# Patient Record
Sex: Male | Born: 1935 | Race: White | Hispanic: No | Marital: Married | Smoking: Former smoker
Health system: Southern US, Community
[De-identification: ages and names within clinical notes are randomized; demographics above are authoritative.]

## PROBLEM LIST (undated history)

## (undated) DIAGNOSIS — I2510 Atherosclerotic heart disease of native coronary artery without angina pectoris: Secondary | ICD-10-CM

## (undated) DIAGNOSIS — M4800 Spinal stenosis, site unspecified: Secondary | ICD-10-CM

## (undated) DIAGNOSIS — Z9289 Personal history of other medical treatment: Secondary | ICD-10-CM

## (undated) DIAGNOSIS — I219 Acute myocardial infarction, unspecified: Secondary | ICD-10-CM

## (undated) DIAGNOSIS — K219 Gastro-esophageal reflux disease without esophagitis: Secondary | ICD-10-CM

## (undated) DIAGNOSIS — Z87438 Personal history of other diseases of male genital organs: Secondary | ICD-10-CM

## (undated) DIAGNOSIS — G2581 Restless legs syndrome: Secondary | ICD-10-CM

## (undated) DIAGNOSIS — D519 Vitamin B12 deficiency anemia, unspecified: Secondary | ICD-10-CM

## (undated) DIAGNOSIS — I4891 Unspecified atrial fibrillation: Secondary | ICD-10-CM

## (undated) DIAGNOSIS — M1990 Unspecified osteoarthritis, unspecified site: Secondary | ICD-10-CM

## (undated) DIAGNOSIS — I10 Essential (primary) hypertension: Secondary | ICD-10-CM

## (undated) DIAGNOSIS — I5032 Chronic diastolic (congestive) heart failure: Secondary | ICD-10-CM

## (undated) DIAGNOSIS — G4700 Insomnia, unspecified: Secondary | ICD-10-CM

## (undated) DIAGNOSIS — E785 Hyperlipidemia, unspecified: Secondary | ICD-10-CM

## (undated) HISTORY — PX: CARPAL TUNNEL RELEASE: SHX101

## (undated) HISTORY — DX: Restless legs syndrome: G25.81

## (undated) HISTORY — DX: Insomnia, unspecified: G47.00

## (undated) HISTORY — PX: TOTAL KNEE ARTHROPLASTY: SHX125

## (undated) HISTORY — DX: Essential (primary) hypertension: I10

## (undated) HISTORY — DX: Atherosclerotic heart disease of native coronary artery without angina pectoris: I25.10

## (undated) HISTORY — DX: Chronic diastolic (congestive) heart failure: I50.32

## (undated) HISTORY — PX: INGUINAL HERNIA REPAIR: SUR1180

## (undated) HISTORY — PX: CORONARY ANGIOPLASTY: SHX604

## (undated) HISTORY — DX: Spinal stenosis, site unspecified: M48.00

## (undated) HISTORY — DX: Personal history of other diseases of male genital organs: Z87.438

## (undated) HISTORY — DX: Unspecified atrial fibrillation: I48.91

## (undated) HISTORY — PX: CORONARY ARTERY BYPASS GRAFT: SHX141

## (undated) HISTORY — DX: Hyperlipidemia, unspecified: E78.5

## (undated) HISTORY — DX: Gastro-esophageal reflux disease without esophagitis: K21.9

## (undated) HISTORY — DX: Personal history of other medical treatment: Z92.89

---

## 1994-12-03 HISTORY — PX: TRANSURETHRAL RESECTION OF PROSTATE: SHX73

## 1995-12-04 HISTORY — PX: CYSTOSCOPY WITH URETHRAL DILATATION: SHX5125

## 2002-05-20 ENCOUNTER — Encounter: Payer: Self-pay | Admitting: Family Medicine

## 2002-05-20 ENCOUNTER — Encounter: Admission: RE | Admit: 2002-05-20 | Discharge: 2002-05-20 | Payer: Self-pay | Admitting: Family Medicine

## 2002-07-08 ENCOUNTER — Encounter: Admission: RE | Admit: 2002-07-08 | Discharge: 2002-08-07 | Payer: Self-pay | Admitting: Neurosurgery

## 2002-08-17 ENCOUNTER — Encounter: Admission: RE | Admit: 2002-08-17 | Discharge: 2002-08-17 | Payer: Self-pay | Admitting: Neurosurgery

## 2002-08-18 ENCOUNTER — Inpatient Hospital Stay (HOSPITAL_COMMUNITY): Admission: EM | Admit: 2002-08-18 | Discharge: 2002-08-21 | Payer: Self-pay | Admitting: Emergency Medicine

## 2002-08-18 ENCOUNTER — Encounter: Payer: Self-pay | Admitting: Emergency Medicine

## 2004-03-13 ENCOUNTER — Encounter: Admission: RE | Admit: 2004-03-13 | Discharge: 2004-03-13 | Payer: Self-pay | Admitting: Orthopedic Surgery

## 2004-03-14 ENCOUNTER — Ambulatory Visit (HOSPITAL_BASED_OUTPATIENT_CLINIC_OR_DEPARTMENT_OTHER): Admission: RE | Admit: 2004-03-14 | Discharge: 2004-03-14 | Payer: Self-pay | Admitting: Orthopedic Surgery

## 2004-03-14 ENCOUNTER — Ambulatory Visit (HOSPITAL_COMMUNITY): Admission: RE | Admit: 2004-03-14 | Discharge: 2004-03-14 | Payer: Self-pay | Admitting: Orthopedic Surgery

## 2004-04-04 ENCOUNTER — Ambulatory Visit (HOSPITAL_COMMUNITY): Admission: RE | Admit: 2004-04-04 | Discharge: 2004-04-04 | Payer: Self-pay | Admitting: Orthopedic Surgery

## 2004-04-04 ENCOUNTER — Ambulatory Visit (HOSPITAL_BASED_OUTPATIENT_CLINIC_OR_DEPARTMENT_OTHER): Admission: RE | Admit: 2004-04-04 | Discharge: 2004-04-04 | Payer: Self-pay | Admitting: Orthopedic Surgery

## 2004-06-12 ENCOUNTER — Inpatient Hospital Stay (HOSPITAL_COMMUNITY): Admission: RE | Admit: 2004-06-12 | Discharge: 2004-06-19 | Payer: Self-pay | Admitting: Orthopedic Surgery

## 2004-07-17 ENCOUNTER — Encounter: Admission: RE | Admit: 2004-07-17 | Discharge: 2004-09-15 | Payer: Self-pay | Admitting: Orthopedic Surgery

## 2004-10-19 ENCOUNTER — Ambulatory Visit: Payer: Self-pay | Admitting: Family Medicine

## 2004-11-20 ENCOUNTER — Ambulatory Visit: Payer: Self-pay | Admitting: Family Medicine

## 2004-12-21 ENCOUNTER — Ambulatory Visit: Payer: Self-pay | Admitting: Family Medicine

## 2005-01-15 ENCOUNTER — Ambulatory Visit: Payer: Self-pay | Admitting: Family Medicine

## 2005-01-19 ENCOUNTER — Ambulatory Visit: Payer: Self-pay

## 2005-01-22 ENCOUNTER — Inpatient Hospital Stay (HOSPITAL_COMMUNITY): Admission: RE | Admit: 2005-01-22 | Discharge: 2005-01-26 | Payer: Self-pay | Admitting: Orthopedic Surgery

## 2005-01-22 ENCOUNTER — Ambulatory Visit: Payer: Self-pay | Admitting: Physical Medicine & Rehabilitation

## 2005-01-22 ENCOUNTER — Ambulatory Visit: Payer: Self-pay | Admitting: Internal Medicine

## 2005-02-14 ENCOUNTER — Ambulatory Visit: Payer: Self-pay | Admitting: Family Medicine

## 2005-02-19 ENCOUNTER — Encounter: Admission: RE | Admit: 2005-02-19 | Discharge: 2005-04-03 | Payer: Self-pay | Admitting: Orthopedic Surgery

## 2005-03-15 ENCOUNTER — Ambulatory Visit: Payer: Self-pay | Admitting: Family Medicine

## 2005-03-23 ENCOUNTER — Ambulatory Visit: Payer: Self-pay | Admitting: Cardiology

## 2005-04-03 ENCOUNTER — Ambulatory Visit: Payer: Self-pay | Admitting: Cardiology

## 2005-04-13 ENCOUNTER — Ambulatory Visit: Payer: Self-pay | Admitting: Family Medicine

## 2005-05-14 ENCOUNTER — Ambulatory Visit: Payer: Self-pay | Admitting: Family Medicine

## 2005-05-18 ENCOUNTER — Ambulatory Visit: Payer: Self-pay | Admitting: Cardiology

## 2005-06-13 ENCOUNTER — Ambulatory Visit: Payer: Self-pay | Admitting: Internal Medicine

## 2005-07-16 ENCOUNTER — Ambulatory Visit: Payer: Self-pay | Admitting: Internal Medicine

## 2005-08-15 ENCOUNTER — Ambulatory Visit: Payer: Self-pay | Admitting: Internal Medicine

## 2005-08-27 ENCOUNTER — Ambulatory Visit: Payer: Self-pay | Admitting: Internal Medicine

## 2005-10-18 ENCOUNTER — Ambulatory Visit: Payer: Self-pay | Admitting: Internal Medicine

## 2005-10-22 ENCOUNTER — Inpatient Hospital Stay (HOSPITAL_COMMUNITY): Admission: EM | Admit: 2005-10-22 | Discharge: 2005-10-23 | Payer: Self-pay | Admitting: *Deleted

## 2005-10-22 ENCOUNTER — Ambulatory Visit: Payer: Self-pay | Admitting: Internal Medicine

## 2005-10-22 ENCOUNTER — Ambulatory Visit: Payer: Self-pay | Admitting: Cardiology

## 2005-10-23 ENCOUNTER — Ambulatory Visit: Payer: Self-pay | Admitting: Internal Medicine

## 2005-10-29 ENCOUNTER — Ambulatory Visit: Payer: Self-pay

## 2005-10-31 ENCOUNTER — Ambulatory Visit: Payer: Self-pay | Admitting: Internal Medicine

## 2005-11-08 ENCOUNTER — Ambulatory Visit: Payer: Self-pay | Admitting: Cardiology

## 2006-01-15 ENCOUNTER — Ambulatory Visit: Payer: Self-pay | Admitting: Internal Medicine

## 2006-01-15 ENCOUNTER — Inpatient Hospital Stay (HOSPITAL_COMMUNITY): Admission: AD | Admit: 2006-01-15 | Discharge: 2006-01-25 | Payer: Self-pay | Admitting: *Deleted

## 2006-01-15 ENCOUNTER — Ambulatory Visit: Payer: Self-pay | Admitting: *Deleted

## 2006-02-06 ENCOUNTER — Ambulatory Visit: Payer: Self-pay

## 2006-02-06 ENCOUNTER — Ambulatory Visit: Payer: Self-pay | Admitting: Cardiology

## 2006-02-21 ENCOUNTER — Encounter (HOSPITAL_COMMUNITY): Admission: RE | Admit: 2006-02-21 | Discharge: 2006-05-22 | Payer: Self-pay | Admitting: Cardiology

## 2006-03-11 ENCOUNTER — Ambulatory Visit: Payer: Self-pay | Admitting: Internal Medicine

## 2006-03-15 ENCOUNTER — Encounter: Admission: RE | Admit: 2006-03-15 | Discharge: 2006-03-15 | Payer: Self-pay | Admitting: Cardiothoracic Surgery

## 2006-03-21 ENCOUNTER — Ambulatory Visit: Payer: Self-pay | Admitting: Cardiology

## 2006-04-24 ENCOUNTER — Ambulatory Visit: Payer: Self-pay | Admitting: Internal Medicine

## 2006-05-01 ENCOUNTER — Ambulatory Visit: Payer: Self-pay | Admitting: Cardiology

## 2006-05-08 ENCOUNTER — Ambulatory Visit: Payer: Self-pay | Admitting: Cardiology

## 2006-05-17 ENCOUNTER — Ambulatory Visit: Payer: Self-pay | Admitting: Cardiology

## 2006-05-23 ENCOUNTER — Encounter (HOSPITAL_COMMUNITY): Admission: RE | Admit: 2006-05-23 | Discharge: 2006-08-21 | Payer: Self-pay | Admitting: Cardiology

## 2006-06-25 ENCOUNTER — Ambulatory Visit: Payer: Self-pay | Admitting: Cardiology

## 2006-07-24 ENCOUNTER — Ambulatory Visit: Payer: Self-pay | Admitting: Internal Medicine

## 2006-09-18 ENCOUNTER — Ambulatory Visit: Payer: Self-pay | Admitting: Internal Medicine

## 2006-11-28 ENCOUNTER — Ambulatory Visit: Payer: Self-pay | Admitting: Cardiology

## 2006-12-03 HISTORY — PX: PACEMAKER INSERTION: SHX728

## 2006-12-16 ENCOUNTER — Ambulatory Visit: Payer: Self-pay

## 2006-12-16 LAB — CONVERTED CEMR LAB
ALT: 13 units/L (ref 0–40)
Albumin: 3.8 g/dL (ref 3.5–5.2)
BUN: 16 mg/dL (ref 6–23)
CO2: 32 meq/L (ref 19–32)
Calcium: 9.1 mg/dL (ref 8.4–10.5)
Chloride: 108 meq/L (ref 96–112)
Glomerular Filtration Rate, Af Am: 74 mL/min/{1.73_m2}
Hemoglobin: 13.4 g/dL (ref 13.0–17.0)
INR: 0.9 (ref 0.9–2.0)
MCHC: 33.7 g/dL (ref 30.0–36.0)
Magnesium: 2.4 mg/dL (ref 1.5–2.5)
Potassium: 4.3 meq/L (ref 3.5–5.1)
RBC: 4.33 M/uL (ref 4.22–5.81)
RDW: 12.8 % (ref 11.5–14.6)
TSH: 2.34 microintl units/mL (ref 0.35–5.50)
Total Bilirubin: 1.1 mg/dL (ref 0.3–1.2)
WBC: 7 10*3/uL (ref 4.5–10.5)
aPTT: 32.3 s (ref 26.5–36.5)

## 2006-12-23 ENCOUNTER — Ambulatory Visit (HOSPITAL_COMMUNITY): Admission: RE | Admit: 2006-12-23 | Discharge: 2006-12-24 | Payer: Self-pay | Admitting: Internal Medicine

## 2006-12-23 ENCOUNTER — Ambulatory Visit: Payer: Self-pay | Admitting: Internal Medicine

## 2006-12-26 ENCOUNTER — Ambulatory Visit: Payer: Self-pay | Admitting: Cardiology

## 2006-12-30 ENCOUNTER — Ambulatory Visit: Payer: Self-pay | Admitting: Internal Medicine

## 2007-01-02 ENCOUNTER — Ambulatory Visit: Payer: Self-pay

## 2007-01-08 ENCOUNTER — Ambulatory Visit: Payer: Self-pay

## 2007-01-08 ENCOUNTER — Ambulatory Visit: Payer: Self-pay | Admitting: Cardiology

## 2007-01-18 ENCOUNTER — Emergency Department (HOSPITAL_COMMUNITY): Admission: EM | Admit: 2007-01-18 | Discharge: 2007-01-18 | Payer: Self-pay | Admitting: Emergency Medicine

## 2007-01-22 ENCOUNTER — Ambulatory Visit: Payer: Self-pay | Admitting: Cardiovascular Disease

## 2007-01-29 ENCOUNTER — Ambulatory Visit: Payer: Self-pay | Admitting: Internal Medicine

## 2007-02-10 ENCOUNTER — Ambulatory Visit: Payer: Self-pay | Admitting: Internal Medicine

## 2007-02-24 ENCOUNTER — Ambulatory Visit: Payer: Self-pay | Admitting: Cardiology

## 2007-02-24 LAB — CONVERTED CEMR LAB
Basophils Absolute: 0 10*3/uL (ref 0.0–0.1)
Basophils Absolute: 0 10*3/uL (ref 0.0–0.1)
Basophils Absolute: 0 10*3/uL (ref 0.0–0.1)
Basophils Absolute: 0 10*3/uL (ref 0.0–0.1)
Basophils Absolute: 0 10*3/uL (ref 0.0–0.1)
Basophils Absolute: 0 10*3/uL (ref 0.0–0.1)
Basophils Relative: 0.6 % (ref 0.0–1.0)
Basophils Relative: 0.6 % (ref 0.0–1.0)
Basophils Relative: 0.6 % (ref 0.0–1.0)
Basophils Relative: 0.6 % (ref 0.0–1.0)
Basophils Relative: 0.6 % (ref 0.0–1.0)
Basophils Relative: 0.6 % (ref 0.0–1.0)
Basophils Relative: 0.6 % (ref 0.0–1.0)
Eosinophils Absolute: 0.4 10*3/uL (ref 0.0–0.6)
Eosinophils Absolute: 0.4 10*3/uL (ref 0.0–0.6)
Eosinophils Absolute: 0.4 10*3/uL (ref 0.0–0.6)
Eosinophils Absolute: 0.4 10*3/uL (ref 0.0–0.6)
Eosinophils Absolute: 0.4 10*3/uL (ref 0.0–0.6)
Eosinophils Absolute: 0.4 10*3/uL (ref 0.0–0.6)
Eosinophils Absolute: 0.4 10*3/uL (ref 0.0–0.6)
Eosinophils Relative: 5.5 % — ABNORMAL HIGH (ref 0.0–5.0)
Eosinophils Relative: 5.5 % — ABNORMAL HIGH (ref 0.0–5.0)
Eosinophils Relative: 5.5 % — ABNORMAL HIGH (ref 0.0–5.0)
Eosinophils Relative: 5.5 % — ABNORMAL HIGH (ref 0.0–5.0)
Eosinophils Relative: 5.5 % — ABNORMAL HIGH (ref 0.0–5.0)
Eosinophils Relative: 5.5 % — ABNORMAL HIGH (ref 0.0–5.0)
HCT: 39.2 % (ref 39.0–52.0)
HCT: 39.2 % (ref 39.0–52.0)
HCT: 39.2 % (ref 39.0–52.0)
HCT: 39.2 % (ref 39.0–52.0)
Hemoglobin: 13.4 g/dL (ref 13.0–17.0)
Hemoglobin: 13.4 g/dL (ref 13.0–17.0)
Hemoglobin: 13.4 g/dL (ref 13.0–17.0)
Hemoglobin: 13.4 g/dL (ref 13.0–17.0)
Hemoglobin: 13.4 g/dL (ref 13.0–17.0)
Hemoglobin: 13.4 g/dL (ref 13.0–17.0)
Hemoglobin: 13.4 g/dL (ref 13.0–17.0)
Hemoglobin: 13.4 g/dL (ref 13.0–17.0)
INR: 3 — ABNORMAL HIGH (ref 0.9–2.0)
INR: 3 — ABNORMAL HIGH (ref 0.9–2.0)
Lymphocytes Relative: 20.8 % (ref 12.0–46.0)
Lymphocytes Relative: 20.8 % (ref 12.0–46.0)
Lymphocytes Relative: 20.8 % (ref 12.0–46.0)
Lymphocytes Relative: 20.8 % (ref 12.0–46.0)
Lymphocytes Relative: 20.8 % (ref 12.0–46.0)
Lymphocytes Relative: 20.8 % (ref 12.0–46.0)
Lymphocytes Relative: 20.8 % (ref 12.0–46.0)
MCHC: 34.3 g/dL (ref 30.0–36.0)
MCHC: 34.3 g/dL (ref 30.0–36.0)
MCHC: 34.3 g/dL (ref 30.0–36.0)
MCV: 88.1 fL (ref 78.0–100.0)
MCV: 88.1 fL (ref 78.0–100.0)
MCV: 88.1 fL (ref 78.0–100.0)
MCV: 88.1 fL (ref 78.0–100.0)
MCV: 88.1 fL (ref 78.0–100.0)
Monocytes Absolute: 0.6 10*3/uL (ref 0.2–0.7)
Monocytes Absolute: 0.6 10*3/uL (ref 0.2–0.7)
Monocytes Absolute: 0.6 10*3/uL (ref 0.2–0.7)
Monocytes Absolute: 0.6 10*3/uL (ref 0.2–0.7)
Monocytes Relative: 7.7 % (ref 3.0–11.0)
Monocytes Relative: 7.7 % (ref 3.0–11.0)
Monocytes Relative: 7.7 % (ref 3.0–11.0)
Monocytes Relative: 7.7 % (ref 3.0–11.0)
Monocytes Relative: 7.7 % (ref 3.0–11.0)
Monocytes Relative: 7.7 % (ref 3.0–11.0)
Monocytes Relative: 7.7 % (ref 3.0–11.0)
Neutro Abs: 5.1 10*3/uL (ref 1.4–7.7)
Neutro Abs: 5.1 10*3/uL (ref 1.4–7.7)
Neutro Abs: 5.1 10*3/uL (ref 1.4–7.7)
Neutro Abs: 5.1 10*3/uL (ref 1.4–7.7)
Neutro Abs: 5.1 10*3/uL (ref 1.4–7.7)
Neutro Abs: 5.1 10*3/uL (ref 1.4–7.7)
Neutrophils Relative %: 65.4 % (ref 43.0–77.0)
Neutrophils Relative %: 65.4 % (ref 43.0–77.0)
Neutrophils Relative %: 65.4 % (ref 43.0–77.0)
Neutrophils Relative %: 65.4 % (ref 43.0–77.0)
Neutrophils Relative %: 65.4 % (ref 43.0–77.0)
Neutrophils Relative %: 65.4 % (ref 43.0–77.0)
Platelets: 256 10*3/uL (ref 150–400)
Platelets: 256 10*3/uL (ref 150–400)
Platelets: 256 10*3/uL (ref 150–400)
Prothrombin Time: 21.8 s — ABNORMAL HIGH (ref 10.0–14.0)
Prothrombin Time: 21.8 s — ABNORMAL HIGH (ref 10.0–14.0)
Prothrombin Time: 21.8 s — ABNORMAL HIGH (ref 10.0–14.0)
Prothrombin Time: 21.8 s — ABNORMAL HIGH (ref 10.0–14.0)
Prothrombin Time: 21.8 s — ABNORMAL HIGH (ref 10.0–14.0)
RBC: 4.45 M/uL (ref 4.22–5.81)
RBC: 4.45 M/uL (ref 4.22–5.81)
RBC: 4.45 M/uL (ref 4.22–5.81)
RBC: 4.45 M/uL (ref 4.22–5.81)
RBC: 4.45 M/uL (ref 4.22–5.81)
RBC: 4.45 M/uL (ref 4.22–5.81)
RBC: 4.45 M/uL (ref 4.22–5.81)
RDW: 13.1 % (ref 11.5–14.6)
RDW: 13.1 % (ref 11.5–14.6)
RDW: 13.1 % (ref 11.5–14.6)
RDW: 13.1 % (ref 11.5–14.6)
RDW: 13.1 % (ref 11.5–14.6)
WBC: 7.7 10*3/uL (ref 4.5–10.5)
WBC: 7.7 10*3/uL (ref 4.5–10.5)
WBC: 7.7 10*3/uL (ref 4.5–10.5)
WBC: 7.7 10*3/uL (ref 4.5–10.5)
WBC: 7.7 10*3/uL (ref 4.5–10.5)

## 2007-02-27 ENCOUNTER — Ambulatory Visit: Payer: Self-pay | Admitting: Cardiology

## 2007-03-04 ENCOUNTER — Ambulatory Visit: Payer: Self-pay | Admitting: *Deleted

## 2007-03-10 ENCOUNTER — Ambulatory Visit: Payer: Self-pay

## 2007-03-10 ENCOUNTER — Encounter: Payer: Self-pay | Admitting: Cardiovascular Disease

## 2007-03-10 ENCOUNTER — Ambulatory Visit: Payer: Self-pay | Admitting: Cardiology

## 2007-03-10 LAB — CONVERTED CEMR LAB
CO2: 32 meq/L (ref 19–32)
Creatinine, Ser: 1.1 mg/dL (ref 0.4–1.5)
GFR calc non Af Amer: 68 mL/min
Potassium: 4.1 meq/L (ref 3.5–5.1)
Sodium: 140 meq/L (ref 135–145)

## 2007-03-17 ENCOUNTER — Ambulatory Visit: Payer: Self-pay | Admitting: Cardiology

## 2007-04-09 ENCOUNTER — Ambulatory Visit: Payer: Self-pay | Admitting: *Deleted

## 2007-04-09 ENCOUNTER — Ambulatory Visit: Payer: Self-pay | Admitting: Cardiology

## 2007-04-09 LAB — CONVERTED CEMR LAB
ALT: 17 units/L (ref 0–40)
AST: 19 units/L (ref 0–37)
Alkaline Phosphatase: 62 units/L (ref 39–117)
BUN: 18 mg/dL (ref 6–23)
Calcium: 8.3 mg/dL — ABNORMAL LOW (ref 8.4–10.5)
Chloride: 110 meq/L (ref 96–112)
GFR calc Af Amer: 82 mL/min
TSH: 1.83 microintl units/mL (ref 0.35–5.50)
Total Bilirubin: 1 mg/dL (ref 0.3–1.2)
Total Protein: 6.9 g/dL (ref 6.0–8.3)

## 2007-04-10 ENCOUNTER — Ambulatory Visit: Payer: Self-pay | Admitting: Cardiology

## 2007-04-10 ENCOUNTER — Ambulatory Visit: Payer: Self-pay | Admitting: Internal Medicine

## 2007-04-11 ENCOUNTER — Ambulatory Visit: Payer: Self-pay | Admitting: Internal Medicine

## 2007-04-24 ENCOUNTER — Ambulatory Visit: Payer: Self-pay | Admitting: Internal Medicine

## 2007-04-29 ENCOUNTER — Ambulatory Visit: Payer: Self-pay | Admitting: Internal Medicine

## 2007-05-05 ENCOUNTER — Ambulatory Visit: Payer: Self-pay | Admitting: Cardiology

## 2007-05-15 ENCOUNTER — Ambulatory Visit: Payer: Self-pay | Admitting: Cardiovascular Disease

## 2007-05-29 ENCOUNTER — Ambulatory Visit: Payer: Self-pay | Admitting: Cardiology

## 2007-05-29 LAB — CONVERTED CEMR LAB
ALT: 26 units/L (ref 0–53)
AST: 21 units/L (ref 0–37)

## 2007-07-23 ENCOUNTER — Ambulatory Visit: Payer: Self-pay | Admitting: Cardiology

## 2007-10-01 ENCOUNTER — Ambulatory Visit: Payer: Self-pay | Admitting: Internal Medicine

## 2007-10-23 ENCOUNTER — Ambulatory Visit: Payer: Self-pay | Admitting: Cardiology

## 2007-10-23 ENCOUNTER — Ambulatory Visit: Payer: Self-pay | Admitting: Internal Medicine

## 2007-10-23 LAB — CONVERTED CEMR LAB
AST: 20 units/L (ref 0–37)
Albumin: 4.1 g/dL (ref 3.5–5.2)
BUN: 18 mg/dL (ref 6–23)
CO2: 30 meq/L (ref 19–32)
Creatinine, Ser: 1.3 mg/dL (ref 0.4–1.5)
GFR calc Af Amer: 68 mL/min
Glucose, Bld: 104 mg/dL — ABNORMAL HIGH (ref 70–99)
Potassium: 5.1 meq/L (ref 3.5–5.1)

## 2008-01-21 ENCOUNTER — Ambulatory Visit: Payer: Self-pay | Admitting: Internal Medicine

## 2008-04-21 ENCOUNTER — Ambulatory Visit: Payer: Self-pay | Admitting: Internal Medicine

## 2008-06-07 ENCOUNTER — Telehealth (INDEPENDENT_AMBULATORY_CARE_PROVIDER_SITE_OTHER): Payer: Self-pay | Admitting: *Deleted

## 2008-06-07 ENCOUNTER — Ambulatory Visit: Payer: Self-pay | Admitting: Internal Medicine

## 2008-06-07 DIAGNOSIS — R0609 Other forms of dyspnea: Secondary | ICD-10-CM | POA: Insufficient documentation

## 2008-06-07 DIAGNOSIS — I10 Essential (primary) hypertension: Secondary | ICD-10-CM | POA: Insufficient documentation

## 2008-06-07 DIAGNOSIS — R0989 Other specified symptoms and signs involving the circulatory and respiratory systems: Secondary | ICD-10-CM

## 2008-06-10 ENCOUNTER — Ambulatory Visit: Payer: Self-pay | Admitting: Cardiology

## 2008-06-24 ENCOUNTER — Ambulatory Visit: Payer: Self-pay | Admitting: Cardiology

## 2008-07-28 ENCOUNTER — Ambulatory Visit: Payer: Self-pay | Admitting: Internal Medicine

## 2008-09-24 ENCOUNTER — Telehealth (INDEPENDENT_AMBULATORY_CARE_PROVIDER_SITE_OTHER): Payer: Self-pay | Admitting: *Deleted

## 2008-09-29 ENCOUNTER — Ambulatory Visit: Payer: Self-pay | Admitting: Internal Medicine

## 2008-10-11 ENCOUNTER — Ambulatory Visit: Payer: Self-pay

## 2008-10-26 ENCOUNTER — Telehealth (INDEPENDENT_AMBULATORY_CARE_PROVIDER_SITE_OTHER): Payer: Self-pay | Admitting: *Deleted

## 2008-11-03 ENCOUNTER — Ambulatory Visit: Payer: Self-pay | Admitting: Cardiology

## 2008-11-25 ENCOUNTER — Ambulatory Visit: Payer: Self-pay | Admitting: Internal Medicine

## 2008-11-25 DIAGNOSIS — K219 Gastro-esophageal reflux disease without esophagitis: Secondary | ICD-10-CM | POA: Insufficient documentation

## 2008-11-25 DIAGNOSIS — N400 Enlarged prostate without lower urinary tract symptoms: Secondary | ICD-10-CM

## 2008-11-25 DIAGNOSIS — I5032 Chronic diastolic (congestive) heart failure: Secondary | ICD-10-CM

## 2008-11-25 DIAGNOSIS — I4891 Unspecified atrial fibrillation: Secondary | ICD-10-CM

## 2008-11-25 DIAGNOSIS — E785 Hyperlipidemia, unspecified: Secondary | ICD-10-CM

## 2008-11-25 DIAGNOSIS — G4700 Insomnia, unspecified: Secondary | ICD-10-CM

## 2008-12-01 ENCOUNTER — Telehealth: Payer: Self-pay | Admitting: Internal Medicine

## 2008-12-21 ENCOUNTER — Ambulatory Visit: Payer: Self-pay | Admitting: Internal Medicine

## 2008-12-23 ENCOUNTER — Telehealth (INDEPENDENT_AMBULATORY_CARE_PROVIDER_SITE_OTHER): Payer: Self-pay | Admitting: *Deleted

## 2008-12-23 ENCOUNTER — Ambulatory Visit: Payer: Self-pay | Admitting: Internal Medicine

## 2008-12-23 ENCOUNTER — Encounter (INDEPENDENT_AMBULATORY_CARE_PROVIDER_SITE_OTHER): Payer: Self-pay | Admitting: *Deleted

## 2008-12-23 ENCOUNTER — Telehealth: Payer: Self-pay | Admitting: Internal Medicine

## 2008-12-24 LAB — CONVERTED CEMR LAB
BUN: 17 mg/dL (ref 6–23)
Basophils Relative: 0.1 % (ref 0.0–3.0)
Calcium: 8.9 mg/dL (ref 8.4–10.5)
Creatinine, Ser: 1.1 mg/dL (ref 0.4–1.5)
Eosinophils Absolute: 0.4 10*3/uL (ref 0.0–0.7)
Eosinophils Relative: 5.2 % — ABNORMAL HIGH (ref 0.0–5.0)
Ferritin: 36.7 ng/mL (ref 22.0–322.0)
GFR calc Af Amer: 82 mL/min
GFR calc non Af Amer: 68 mL/min
HCT: 37.9 % — ABNORMAL LOW (ref 39.0–52.0)
HDL: 25.8 mg/dL — ABNORMAL LOW (ref >39.0)
MCV: 91.1 fL (ref 78.0–100.0)
Monocytes Absolute: 0.6 10*3/uL (ref 0.1–1.0)
Platelets: 216 10*3/uL (ref 150–400)
Potassium: 4.7 meq/L (ref 3.5–5.1)
WBC: 7.1 10*3/uL (ref 4.5–10.5)

## 2009-01-11 ENCOUNTER — Ambulatory Visit: Payer: Self-pay | Admitting: Internal Medicine

## 2009-01-17 ENCOUNTER — Encounter (INDEPENDENT_AMBULATORY_CARE_PROVIDER_SITE_OTHER): Payer: Self-pay | Admitting: *Deleted

## 2009-01-17 LAB — CONVERTED CEMR LAB: Fecal Occult Bld: NEGATIVE

## 2009-01-21 ENCOUNTER — Ambulatory Visit: Payer: Self-pay | Admitting: Internal Medicine

## 2009-02-01 ENCOUNTER — Ambulatory Visit: Payer: Self-pay | Admitting: Internal Medicine

## 2009-02-01 DIAGNOSIS — D649 Anemia, unspecified: Secondary | ICD-10-CM

## 2009-02-01 DIAGNOSIS — M549 Dorsalgia, unspecified: Secondary | ICD-10-CM | POA: Insufficient documentation

## 2009-03-10 ENCOUNTER — Encounter: Payer: Self-pay | Admitting: Internal Medicine

## 2009-04-29 ENCOUNTER — Encounter: Payer: Self-pay | Admitting: Internal Medicine

## 2009-06-24 DIAGNOSIS — I495 Sick sinus syndrome: Secondary | ICD-10-CM | POA: Insufficient documentation

## 2009-06-24 DIAGNOSIS — I25810 Atherosclerosis of coronary artery bypass graft(s) without angina pectoris: Secondary | ICD-10-CM | POA: Insufficient documentation

## 2009-06-27 ENCOUNTER — Telehealth (INDEPENDENT_AMBULATORY_CARE_PROVIDER_SITE_OTHER): Payer: Self-pay | Admitting: *Deleted

## 2009-07-01 ENCOUNTER — Ambulatory Visit: Payer: Self-pay | Admitting: Cardiology

## 2009-07-26 ENCOUNTER — Inpatient Hospital Stay (HOSPITAL_COMMUNITY): Admission: EM | Admit: 2009-07-26 | Discharge: 2009-07-27 | Payer: Self-pay | Admitting: Emergency Medicine

## 2009-07-26 ENCOUNTER — Ambulatory Visit: Payer: Self-pay | Admitting: Internal Medicine

## 2009-07-27 ENCOUNTER — Ambulatory Visit: Payer: Self-pay | Admitting: Vascular Surgery

## 2009-07-27 ENCOUNTER — Encounter (INDEPENDENT_AMBULATORY_CARE_PROVIDER_SITE_OTHER): Payer: Self-pay | Admitting: Internal Medicine

## 2009-08-18 ENCOUNTER — Ambulatory Visit: Payer: Self-pay | Admitting: Internal Medicine

## 2009-08-18 DIAGNOSIS — R7309 Other abnormal glucose: Secondary | ICD-10-CM | POA: Insufficient documentation

## 2009-08-18 DIAGNOSIS — R55 Syncope and collapse: Secondary | ICD-10-CM | POA: Insufficient documentation

## 2009-08-19 ENCOUNTER — Encounter: Payer: Self-pay | Admitting: Internal Medicine

## 2009-08-23 ENCOUNTER — Telehealth (INDEPENDENT_AMBULATORY_CARE_PROVIDER_SITE_OTHER): Payer: Self-pay | Admitting: *Deleted

## 2009-08-24 ENCOUNTER — Ambulatory Visit: Payer: Self-pay | Admitting: Internal Medicine

## 2009-08-30 LAB — CONVERTED CEMR LAB
Ferritin: 20.4 ng/mL — ABNORMAL LOW (ref 22.0–322.0)
Hemoglobin: 12.4 g/dL — ABNORMAL LOW (ref 13.0–17.0)

## 2009-09-21 ENCOUNTER — Ambulatory Visit: Payer: Self-pay | Admitting: Internal Medicine

## 2009-09-26 ENCOUNTER — Encounter (INDEPENDENT_AMBULATORY_CARE_PROVIDER_SITE_OTHER): Payer: Self-pay | Admitting: *Deleted

## 2009-09-29 ENCOUNTER — Ambulatory Visit: Payer: Self-pay | Admitting: Cardiology

## 2009-10-11 ENCOUNTER — Ambulatory Visit: Payer: Self-pay | Admitting: Internal Medicine

## 2010-01-04 ENCOUNTER — Telehealth: Payer: Self-pay | Admitting: Cardiology

## 2010-01-30 ENCOUNTER — Ambulatory Visit: Payer: Self-pay | Admitting: Cardiology

## 2010-01-30 ENCOUNTER — Encounter: Payer: Self-pay | Admitting: Internal Medicine

## 2010-01-31 ENCOUNTER — Ambulatory Visit: Payer: Self-pay | Admitting: Internal Medicine

## 2010-01-31 DIAGNOSIS — I472 Ventricular tachycardia: Secondary | ICD-10-CM

## 2010-02-28 ENCOUNTER — Ambulatory Visit: Payer: Self-pay | Admitting: Internal Medicine

## 2010-03-14 ENCOUNTER — Encounter (INDEPENDENT_AMBULATORY_CARE_PROVIDER_SITE_OTHER): Payer: Self-pay | Admitting: *Deleted

## 2010-03-22 ENCOUNTER — Telehealth: Payer: Self-pay | Admitting: Internal Medicine

## 2010-04-04 ENCOUNTER — Ambulatory Visit: Payer: Self-pay | Admitting: Cardiology

## 2010-04-17 ENCOUNTER — Telehealth (INDEPENDENT_AMBULATORY_CARE_PROVIDER_SITE_OTHER): Payer: Self-pay | Admitting: *Deleted

## 2010-05-04 ENCOUNTER — Ambulatory Visit: Payer: Self-pay | Admitting: Internal Medicine

## 2010-05-04 DIAGNOSIS — K625 Hemorrhage of anus and rectum: Secondary | ICD-10-CM | POA: Insufficient documentation

## 2010-05-05 ENCOUNTER — Encounter (INDEPENDENT_AMBULATORY_CARE_PROVIDER_SITE_OTHER): Payer: Self-pay | Admitting: *Deleted

## 2010-05-10 ENCOUNTER — Telehealth (INDEPENDENT_AMBULATORY_CARE_PROVIDER_SITE_OTHER): Payer: Self-pay | Admitting: *Deleted

## 2010-05-10 LAB — CONVERTED CEMR LAB
BUN: 15 mg/dL (ref 6–23)
Basophils Absolute: 0 10*3/uL (ref 0.0–0.1)
Calcium: 8.6 mg/dL (ref 8.4–10.5)
Eosinophils Absolute: 0.3 10*3/uL (ref 0.0–0.7)
GFR calc non Af Amer: 71.11 mL/min (ref >60)
Glucose, Bld: 90 mg/dL (ref 70–99)
HCT: 34.8 % — ABNORMAL LOW (ref 39.0–52.0)
Lymphocytes Relative: 20.2 % (ref 12.0–46.0)
Lymphs Abs: 1.3 10*3/uL (ref 0.7–4.0)
Monocytes Relative: 9.2 % (ref 3.0–12.0)
PSA: 0.52 ng/mL (ref 0.10–4.00)
Platelets: 219 10*3/uL (ref 150.0–400.0)
RDW: 14.9 % — ABNORMAL HIGH (ref 11.5–14.6)
Transferrin: 242.7 mg/dL (ref 212.0–360.0)
Vitamin B-12: 207 pg/mL — ABNORMAL LOW (ref 211–911)

## 2010-05-31 ENCOUNTER — Ambulatory Visit: Payer: Self-pay | Admitting: Internal Medicine

## 2010-06-04 ENCOUNTER — Inpatient Hospital Stay (HOSPITAL_COMMUNITY): Admission: EM | Admit: 2010-06-04 | Discharge: 2010-06-12 | Payer: Self-pay | Admitting: Internal Medicine

## 2010-06-04 ENCOUNTER — Encounter: Payer: Self-pay | Admitting: Emergency Medicine

## 2010-06-04 ENCOUNTER — Ambulatory Visit: Payer: Self-pay | Admitting: Internal Medicine

## 2010-06-05 ENCOUNTER — Encounter: Payer: Self-pay | Admitting: Internal Medicine

## 2010-06-08 ENCOUNTER — Encounter: Payer: Self-pay | Admitting: Internal Medicine

## 2010-06-08 ENCOUNTER — Encounter: Payer: Self-pay | Admitting: Cardiology

## 2010-06-16 ENCOUNTER — Encounter: Payer: Self-pay | Admitting: Cardiology

## 2010-06-16 ENCOUNTER — Ambulatory Visit: Payer: Self-pay | Admitting: Cardiology

## 2010-06-16 DIAGNOSIS — I498 Other specified cardiac arrhythmias: Secondary | ICD-10-CM | POA: Insufficient documentation

## 2010-06-21 ENCOUNTER — Encounter: Payer: Self-pay | Admitting: Internal Medicine

## 2010-06-23 ENCOUNTER — Encounter: Payer: Self-pay | Admitting: Internal Medicine

## 2010-06-23 LAB — CONVERTED CEMR LAB
ALT: 27 U/L
AST: 23 U/L
Albumin: 3.2 g/dL — ABNORMAL LOW
Alkaline Phosphatase: 68 U/L
BUN: 18 mg/dL
Basophils Absolute: 0.1 10*3/uL
Basophils Relative: 0.7 %
Bilirubin, Direct: 0.1 mg/dL
CO2: 28 meq/L
Calcium: 8.4 mg/dL
Chloride: 109 meq/L
Creatinine, Ser: 1.4 mg/dL
Eosinophils Absolute: 0.4 10*3/uL
Eosinophils Relative: 3.9 %
GFR calc non Af Amer: 51.43 mL/min
Glucose, Bld: 94 mg/dL
HCT: 30.1 % — ABNORMAL LOW
Hemoglobin: 10.3 g/dL — ABNORMAL LOW
Lymphocytes Relative: 10.3 % — ABNORMAL LOW
Lymphs Abs: 0.9 10*3/uL
MCHC: 34.2 g/dL
MCV: 91.9 fL
Monocytes Absolute: 0.8 10*3/uL
Monocytes Relative: 8.7 %
Neutro Abs: 6.9 10*3/uL
Neutrophils Relative %: 76.4 %
Platelets: 360 10*3/uL
Potassium: 5 meq/L
RBC: 3.28 M/uL — ABNORMAL LOW
RDW: 13.7 %
Sodium: 140 meq/L
Total Bilirubin: 0.4 mg/dL
Total Protein: 6.6 g/dL
WBC: 9 10*3/uL

## 2010-06-28 ENCOUNTER — Ambulatory Visit: Payer: Self-pay | Admitting: Cardiology

## 2010-06-29 LAB — CONVERTED CEMR LAB
Chloride: 107 meq/L (ref 96–112)
GFR calc non Af Amer: 47.1 mL/min (ref >60)
Potassium: 4.8 meq/L (ref 3.5–5.1)
Sodium: 137 meq/L (ref 135–145)

## 2010-07-21 ENCOUNTER — Ambulatory Visit: Payer: Self-pay | Admitting: Cardiology

## 2010-07-21 DIAGNOSIS — I959 Hypotension, unspecified: Secondary | ICD-10-CM

## 2010-07-24 ENCOUNTER — Ambulatory Visit: Payer: Self-pay | Admitting: Cardiology

## 2010-07-27 ENCOUNTER — Encounter: Payer: Self-pay | Admitting: Cardiology

## 2010-08-29 ENCOUNTER — Ambulatory Visit: Payer: Self-pay | Admitting: Internal Medicine

## 2010-08-31 ENCOUNTER — Ambulatory Visit: Payer: Self-pay | Admitting: Internal Medicine

## 2010-09-07 ENCOUNTER — Encounter: Payer: Self-pay | Admitting: Internal Medicine

## 2010-09-19 ENCOUNTER — Ambulatory Visit: Payer: Self-pay | Admitting: Cardiology

## 2010-10-18 ENCOUNTER — Encounter: Payer: Self-pay | Admitting: Internal Medicine

## 2010-11-21 ENCOUNTER — Ambulatory Visit: Payer: Self-pay | Admitting: Cardiology

## 2010-11-21 ENCOUNTER — Encounter: Payer: Self-pay | Admitting: Cardiology

## 2010-11-30 ENCOUNTER — Encounter: Payer: Self-pay | Admitting: Cardiology

## 2010-12-03 HISTORY — PX: CATARACT EXTRACTION W/ INTRAOCULAR LENS  IMPLANT, BILATERAL: SHX1307

## 2010-12-07 ENCOUNTER — Ambulatory Visit
Admission: RE | Admit: 2010-12-07 | Discharge: 2010-12-07 | Payer: Self-pay | Source: Home / Self Care | Attending: Internal Medicine | Admitting: Internal Medicine

## 2010-12-07 ENCOUNTER — Encounter: Payer: Self-pay | Admitting: Internal Medicine

## 2010-12-07 LAB — CONVERTED CEMR LAB
Albumin: 4 g/dL (ref 3.5–5.2)
Alkaline Phosphatase: 69 units/L (ref 39–117)
BUN: 20 mg/dL (ref 6–23)
Basophils Absolute: 0 10*3/uL (ref 0.0–0.1)
CO2: 29 meq/L (ref 19–32)
Calcium: 8.8 mg/dL (ref 8.4–10.5)
Eosinophils Absolute: 0.2 10*3/uL (ref 0.0–0.7)
GFR calc non Af Amer: 62.07 mL/min (ref >60.00)
Glucose, Bld: 83 mg/dL (ref 70–99)
Hemoglobin: 12.8 g/dL — ABNORMAL LOW (ref 13.0–17.0)
Lymphocytes Relative: 11.8 % — ABNORMAL LOW (ref 12.0–46.0)
Lymphs Abs: 0.9 10*3/uL (ref 0.7–4.0)
MCHC: 33.6 g/dL (ref 30.0–36.0)
Neutro Abs: 6.1 10*3/uL (ref 1.4–7.7)
Platelets: 252 10*3/uL (ref 150.0–400.0)
RDW: 14.9 % — ABNORMAL HIGH (ref 11.5–14.6)
Sodium: 141 meq/L (ref 135–145)
TSH: 3.05 microintl units/mL (ref 0.35–5.50)
Total Bilirubin: 0.8 mg/dL (ref 0.3–1.2)

## 2010-12-21 ENCOUNTER — Telehealth (INDEPENDENT_AMBULATORY_CARE_PROVIDER_SITE_OTHER): Payer: Self-pay | Admitting: *Deleted

## 2010-12-22 ENCOUNTER — Encounter (INDEPENDENT_AMBULATORY_CARE_PROVIDER_SITE_OTHER): Payer: Self-pay | Admitting: *Deleted

## 2010-12-22 ENCOUNTER — Telehealth (INDEPENDENT_AMBULATORY_CARE_PROVIDER_SITE_OTHER): Payer: Self-pay | Admitting: *Deleted

## 2010-12-31 LAB — CONVERTED CEMR LAB
AST: 15 units/L (ref 0–37)
Albumin: 3.7 g/dL (ref 3.5–5.2)
Alkaline Phosphatase: 58 units/L (ref 39–117)
BUN: 15 mg/dL (ref 6–23)
BUN: 23 mg/dL (ref 6–23)
Basophils Absolute: 0 10*3/uL (ref 0.0–0.1)
Basophils Relative: 0.3 % (ref 0.0–3.0)
CO2: 28 meq/L (ref 19–32)
Calcium: 8.7 mg/dL (ref 8.4–10.5)
Creatinine, Ser: 1.3 mg/dL (ref 0.4–1.5)
Eosinophils Relative: 3.2 % (ref 0.0–5.0)
Eosinophils Relative: 4.1 % (ref 0.0–5.0)
Free T4: 0.83 ng/dL (ref 0.60–1.60)
GFR calc non Af Amer: 48.97 mL/min (ref >60)
GFR calc non Af Amer: 56.53 mL/min (ref >60)
Glucose, Bld: 84 mg/dL (ref 70–99)
Glucose, Bld: 89 mg/dL (ref 70–99)
HCT: 33.5 % — ABNORMAL LOW (ref 39.0–52.0)
Lymphocytes Relative: 15.7 % (ref 12.0–46.0)
Lymphs Abs: 1.2 10*3/uL (ref 0.7–4.0)
Lymphs Abs: 1.3 10*3/uL (ref 0.7–4.0)
MCV: 90.9 fL (ref 78.0–100.0)
Monocytes Absolute: 0.7 10*3/uL (ref 0.1–1.0)
Monocytes Relative: 8.6 % (ref 3.0–12.0)
Monocytes Relative: 8.7 % (ref 3.0–12.0)
Neutrophils Relative %: 72.2 % (ref 43.0–77.0)
Neutrophils Relative %: 72.9 % (ref 43.0–77.0)
Platelets: 238 10*3/uL (ref 150.0–400.0)
Platelets: 265 10*3/uL (ref 150.0–400.0)
Potassium: 4.9 meq/L (ref 3.5–5.1)
RBC: 3.68 M/uL — ABNORMAL LOW (ref 4.22–5.81)
RDW: 15.2 % — ABNORMAL HIGH (ref 11.5–14.6)
Sodium: 140 meq/L (ref 135–145)
Total Protein: 6.6 g/dL (ref 6.0–8.3)
WBC: 8.3 10*3/uL (ref 4.5–10.5)
WBC: 8.4 10*3/uL (ref 4.5–10.5)

## 2011-01-04 NOTE — Cardiovascular Report (Signed)
Summary: Office Visit Remote   Office Visit Remote   Imported By: Roderic Ovens 06/22/2010 11:01:20  _____________________________________________________________________  External Attachment:    Type:   Image     Comment:   External Document

## 2011-01-04 NOTE — Assessment & Plan Note (Signed)
Summary: FLU SHOT///SPH  Nurse Visit  CC: Flu shot./kb   Allergies: 1)  ! Lipitor (Atorvastatin Calcium)  Orders Added: 1)  Flu Vaccine 48yrs + MEDICARE PATIENTS [Q2039] 2)  Administration Flu vaccine - MCR [G0008]          Flu Vaccine Consent Questions     Do you have a history of severe allergic reactions to this vaccine? no    Any prior history of allergic reactions to egg and/or gelatin? no    Do you have a sensitivity to the preservative Thimersol? no    Do you have a past history of Guillan-Barre Syndrome? no    Do you currently have an acute febrile illness? no    Have you ever had a severe reaction to latex? no    Vaccine information given and explained to patient? yes    Are you currently pregnant? no    Lot Number:[id number]   Exp Date:06/02/2011   Site Given  Left Deltoid IM

## 2011-01-04 NOTE — Assessment & Plan Note (Signed)
Summary: 2 MONTH ROV   Visit Type:  2 months follow up Primary [name]:  [name]  CC:  Low BP- Tiredness.  History of Present Illness: Overall doing well.  Denies chest pain.  A little fatigue, but nothing dramatic.  Feels ok at the present time.  Not short of breath.    Current Medications (verified): 1)  Carvedilol 12.5 Mg Tabs (Carvedilol) .... Take 1/2  Tablet By Mouth Twice A Day 2)  Amlodipine Besylate 2.5 Mg  Tabs (Amlodipine Besylate) .... One By Mouth Qam 3)  Aspirin 81 Mg Tbec (Aspirin) .... Take One Tablet By Mouth Daily 4)  Omeprazole 40 Mg Cpdr (Omeprazole) .... As Needed 5)  B Complex-B12  Tabs (B Complex Vitamins) .... Take 1 Tablet By Mouth Once A Day 6)  Multivitamins  Tabs (Multiple Vitamin) .... Take 1 Tablet By Mouth Once A Day 7)  Isosorbide Mononitrate Cr 30 Mg Xr24h-Tab (Isosorbide Mononitrate) .... Take One Tablet By Mouth Daily 8)  Plavix 75 Mg Tabs (Clopidogrel Bisulfate) .... Take One Tablet By Mouth Daily 9)  Amiodarone Hcl 200 Mg Tabs (Amiodarone Hcl) .... Take One Tablet By Mouth Daily  Allergies: 1)  ! Lipitor (Atorvastatin Calcium)  Vital Signs:  Patient profile:   77 year old male Height:      69 inches Weight:      194.75 pounds BMI:     28.86 Pulse rate:   61 / minute Pulse rhythm:   regular Resp:     18 per minute BP sitting:   120 / 67  (left arm) Cuff size:   large  Vitals Entered By: Vikki Ports (September 19, 2010 1:56 PM)  Physical Exam  General:  Well developed, well nourished, in no acute distress. Head:  normocephalic and atraumatic Eyes:  PERRLA/EOM intact; conjunctiva and lids normal. Lungs:  Clear bilaterally to auscultation and percussion. Heart:  SEM.  No DM.  Pos S4.   Abdomen:  Bowel sounds positive; abdomen soft and non-tender without masses, organomegaly, or hernias noted. No hepatosplenomegaly. Extremities:  No clubbing or cyanosis. Neurologic:  Alert and oriented x 3.   EKG  Procedure date:   09/19/2010  Findings:      Atrial pacing with RBBB.   PPM Specifications Following [name]:  [name], [name]     Referring [name]:  [facility] PPM Vendor:  [employer]     PPM Model Number:  ADDROI     PPM Serial Number:  [id number] PPM DOI:  12/23/2006      Lead 1    Location: RA     DOI: 12/23/2006     Model #: 4098     Serial #: [id number]     Status: active Lead 2    Location: RV     DOI: 12/23/2006     Model #: 6213     Serial #: [id number]     Status: active  Magnet Response Rate:  BOL 85 ERI 65  Indications:  TACHY/BRADY   PPM Follow Up Pacer Dependent:  No      Episodes Coumadin:  No  Parameters Mode:  DDDR     Lower Rate Limit:  60     Upper Rate Limit:  130 Paced AV Delay:  350     Sensed AV Delay:  350  Impression & Recommendations:  Problem # 1:  CAD, ARTERY BYPASS GRAFT (ICD-414.04)  No symptoms.   Wants to stop some medication.  With no angina, will hold imdur at  this point. The following medications were removed from the medication list:    Isosorbide Mononitrate Cr 30 Mg Xr24h-tab (Isosorbide mononitrate) .Marland Kitchen... Take one tablet by mouth daily His updated medication list for this problem includes:    Carvedilol 12.5 Mg Tabs (Carvedilol) .Marland Kitchen... Take 1/2  tablet by mouth twice a day    Amlodipine Besylate 2.5 Mg Tabs (Amlodipine besylate) ..... One by mouth qam    Aspirin 81 Mg Tbec (Aspirin) .Marland Kitchen... Take one tablet by mouth daily    Plavix 75 Mg Tabs (Clopidogrel bisulfate) .Marland Kitchen... Take one tablet by mouth daily  Orders: EKG w/ Interpretation (93000) TLB-BMP (Basic Metabolic Panel-BMET) (80048-METABOL) TLB-CBC Platelet - w/Differential (85025-CBCD)  Problem # 2:  ATRIAL FIBRILLATION (ICD-427.31)  No recurrence at present.  Wil continue Amiodarone.  Check CBC since on DAPT.   His updated medication list for this problem includes:    Carvedilol 12.5 Mg Tabs (Carvedilol) .Marland Kitchen... Take 1/2  tablet by mouth twice a day    Aspirin 81 Mg Tbec (Aspirin) .Marland Kitchen... Take one tablet by  mouth daily    Plavix 75 Mg Tabs (Clopidogrel bisulfate) .Marland Kitchen... Take one tablet by mouth daily    Amiodarone Hcl 200 Mg Tabs (Amiodarone hcl) .Marland Kitchen... Take one tablet by mouth daily  Orders: EKG w/ Interpretation (93000) TLB-BMP (Basic Metabolic Panel-BMET) (80048-METABOL) TLB-CBC Platelet - w/Differential (85025-CBCD)  Problem # 3:  HYPERTENSION (ICD-401.9) controlled.   His updated medication list for this problem includes:    Carvedilol 12.5 Mg Tabs (Carvedilol) .Marland Kitchen... Take 1/2  tablet by mouth twice a day    Amlodipine Besylate 2.5 Mg Tabs (Amlodipine besylate) ..... One by mouth qam    Aspirin 81 Mg Tbec (Aspirin) .Marland Kitchen... Take one tablet by mouth daily  Patient Instructions: 1)  Your physician recommends that you schedule a follow-up appointment in: 2 MONTHS 2)  Your physician recommends that you have lab work today: BMP and CBC 3)  Your physician has recommended you make the following change in your medication: STOP Isosorbide MN (IMDUR)

## 2011-01-04 NOTE — Assessment & Plan Note (Signed)
Summary: f11m   Visit Type:  1 month follow up  CC:  No complains.  History of Present Illness: actually doing quite well.  Denies any chest pain.  Not short of breath.  A little weak.  Overall doing well.    Current Medications (verified): 1)  Lisinopril 20 Mg Tabs (Lisinopril) .... Take One-Half Tablet By Mouth Once A Day 2)  Carvedilol 12.5 Mg Tabs (Carvedilol) .... Take 1/2  Tablet By Mouth Twice A Day 3)  Amlodipine Besylate 2.5 Mg  Tabs (Amlodipine Besylate) .... One By Mouth Qam 4)  Aspirin 81 Mg Tbec (Aspirin) .... Take One Tablet By Mouth Daily 5)  Omeprazole 40 Mg Cpdr (Omeprazole) .... As Needed 6)  B Complex-B12  Tabs (B Complex Vitamins) .... Take 1 Tablet By Mouth Once A Day 7)  Multivitamins  Tabs (Multiple Vitamin) .... Take 1 Tablet By Mouth Once A Day 8)  Isosorbide Mononitrate Cr 30 Mg Xr24h-Tab (Isosorbide Mononitrate) .... Take One Tablet By Mouth Daily 9)  Plavix 75 Mg Tabs (Clopidogrel Bisulfate) .... Take One Tablet By Mouth Daily 10)  Amiodarone Hcl 200 Mg Tabs (Amiodarone Hcl) .... Take 2  Tablet By Mouth Daily  Allergies: 1)  ! Lipitor (Atorvastatin Calcium)  Vital Signs:  Patient profile:   77 year old male Height:      69 inches Weight:      192.50 pounds BMI:     28.53 Pulse rate:   59 / minute Pulse rhythm:   regular Resp:     18 per minute BP sitting:   84 / 58  (right arm) Cuff size:   large  Vitals Entered By: Vikki Ports (July 21, 2010 11:01 AM)  Physical Exam  General:  Well developed, well nourished, in no acute distress. Head:  normocephalic and atraumatic Eyes:  PERRLA/EOM intact; conjunctiva and lids normal. Lungs:  Clear bilaterally to auscultation and percussion. Heart:  Prominent S4 gallop Abdomen:  Bowel sounds positive; abdomen soft and non-tender without masses, organomegaly, or hernias noted. No hepatosplenomegaly. Pulses:  pulses normal in all 4 extremities Extremities:  No clubbing or cyanosis. Neurologic:  Alert and  oriented x 3.   Cardiac Cath  Procedure date:  06/07/2010  Findings:      CONCLUSIONS: 1. Patent internal mammary to the left anterior descending artery. 2. Patent saphenous vein graft to the diagonal. 3. Patent saphenous vein graft to the obtuse marginal. 4. Patent saphenous vein graft to posterior descending artery. 5. Subtotal small marginal branch.   At the present time, we will continue to treat the patient medically. He will need something like diltiazem in order to prevent his paroxysmal atrial fibrillation.  PPM Specifications Following MD:  [name], MD     Referring MD:  [facility] Vendor:  [employer]     PPM Model Number:  ADDROI     PPM Serial Number:  [id number] PPM DOI:  12/23/2006      Lead 1    Location: RA     DOI: 12/23/2006     Model #: 8416     Serial #: [id number]     Status: active Lead 2    Location: RV     DOI: 12/23/2006     Model #: 9323     Serial #: [id number]     Status: active  Magnet Response Rate:  BOL 85 ERI 65  Indications:  TACHY/BRADY   PPM Follow Up Pacer Dependent:  No      Episodes Coumadin:  No  Parameters Mode:  DDDR     Lower Rate Limit:  60     Upper Rate Limit:  130 Paced AV Delay:  350     Sensed AV Delay:  350  Impression & Recommendations:  Problem # 1:  HYPOTENSION (ICD-458.9) will make changes in meds.  Hold Lisinopril.  Decrease Amiodarone.  Followup on Monday.  Orders: EKG w/ Interpretation (93000) TLB-CBC Platelet - w/Differential (85025-CBCD) TLB-BMP (Basic Metabolic Panel-BMET) (80048-METABOL) TLB-Hepatic/Liver Function Pnl (80076-HEPATIC) TLB-T4 (Thyrox), Free [phone]) TLB-TSH (Thyroid Stimulating Hormone) (84443-TSH)  Problem # 2:  SUPRAVENTRICULAR TACHYCARDIA (ICD-427.89) no clinical recurrence of arrythmia.  See dosage adjustment.  The following medications were removed from the medication list:    Lisinopril 20 Mg Tabs (Lisinopril) .Marland Kitchen... Take one-half tablet by mouth once a day His updated  medication list for this problem includes:    Carvedilol 12.5 Mg Tabs (Carvedilol) .Marland Kitchen... Take 1/2  tablet by mouth twice a day    Amlodipine Besylate 2.5 Mg Tabs (Amlodipine besylate) ..... One by mouth qam    Aspirin 81 Mg Tbec (Aspirin) .Marland Kitchen... Take one tablet by mouth daily    Isosorbide Mononitrate Cr 30 Mg Xr24h-tab (Isosorbide mononitrate) .Marland Kitchen... Take one tablet by mouth daily    Plavix 75 Mg Tabs (Clopidogrel bisulfate) .Marland Kitchen... Take one tablet by mouth daily    Amiodarone Hcl 200 Mg Tabs (Amiodarone hcl) .Marland Kitchen... Take one tablet by mouth daily  Problem # 3:  CAD, ARTERY BYPASS GRAFT (ICD-414.04) see cath report.  Films had been reviewed with Dr. Excell Seltzer, and we were in agreement on approach.  The following medications were removed from the medication list:    Lisinopril 20 Mg Tabs (Lisinopril) .Marland Kitchen... Take one-half tablet by mouth once a day His updated medication list for this problem includes:    Carvedilol 12.5 Mg Tabs (Carvedilol) .Marland Kitchen... Take 1/2  tablet by mouth twice a day    Amlodipine Besylate 2.5 Mg Tabs (Amlodipine besylate) ..... One by mouth qam    Aspirin 81 Mg Tbec (Aspirin) .Marland Kitchen... Take one tablet by mouth daily    Isosorbide Mononitrate Cr 30 Mg Xr24h-tab (Isosorbide mononitrate) .Marland Kitchen... Take one tablet by mouth daily    Plavix 75 Mg Tabs (Clopidogrel bisulfate) .Marland Kitchen... Take one tablet by mouth daily  Orders: EKG w/ Interpretation (93000) TLB-CBC Platelet - w/Differential (85025-CBCD) TLB-BMP (Basic Metabolic Panel-BMET) (80048-METABOL) TLB-Hepatic/Liver Function Pnl (80076-HEPATIC) TLB-T4 (Thyrox), Free [phone]) TLB-TSH (Thyroid Stimulating Hormone) [phone])  Patient Instructions: 1)  Your physician recommends that you schedule a follow-up appointment on Monday July 24, 2010 at  12:30 2)  Your physician recommends that you have lab work today: CBC, BMP, LIVER, TSH, Free T4 3)  Your physician has recommended you make the following change in your medication: STOP  Lisinopril, DECREASE Amiodarone 200mg  one tablet daily  Appended Document: f86m ECG:  A pacing.  V tracking with RBBB.  T inversion in V5.

## 2011-01-04 NOTE — Progress Notes (Signed)
Summary: pt needs refill today  Phone Note Refill Request Message from:  Patient  Refills Requested: Medication #1:  COREG 12.5 MG  TABS Take 1 1/2 tablet by mouth two times a day Initial call taken by: Omer Jack,  January 04, 2010 10:06 AM    Prescriptions: COREG 12.5 MG  TABS (CARVEDILOL) Take 1 1/2 tablet by mouth two times a day  #90 x 11   Entered by:   Laurance Flatten CMA   Authorized by:   Ronaldo Miyamoto, MD, [facility]   Signed by:   Laurance Flatten CMA on 01/04/2010   Method used:   [facility] to        [facility] [phone]* (retail)       [address]       [place], [place]  [address]       Ph: [phone]       Fax: [phone]   RxID:   [id number]

## 2011-01-04 NOTE — Assessment & Plan Note (Signed)
Summary: f34m   Visit Type:  2 months follow up  CC:  Chest tightness.  History of Present Illness: Did alot of work yesterday.  He tolerated it well.  A week ago he had an episode of chest tightness.  It lasted just a few minutes, and when he got quite it went away.  But yesterday he worked in the yard without difficulty.  He had a little burning after dinner.  Went away.  He feels better since cutting back on medications.  Carvedilol is lower.  Also stopped taking PPI that Dr. Drue Novel recommended.  Discussed diet, nature of symptoms, and things which worsen acid reflux.    Current Medications (verified): 1)  Lisinopril 20 Mg Tabs (Lisinopril) .Marland Kitchen.. 1 By Mouth Two Times A Day 2)  Coreg 12.5 Mg Tabs (Carvedilol) .... Take One-Half Tablet By Mouth Twice A Day 3)  Amlodipine Besylate 2.5 Mg  Tabs (Amlodipine Besylate) .... One By Mouth Qam 4)  Aspirin 81 Mg Tbec (Aspirin) .... Take One Tablet By Mouth Daily 5)  Vitamin B Complex  Tabs (B Complex Vitamins) .... Take One Tablet Daily  Allergies: 1)  ! Lipitor (Atorvastatin Calcium)  Vital Signs:  Patient profile:   77 year old male Height:      69 inches Weight:      201.75 pounds BMI:     29.90 Pulse rate:   64 / minute Pulse rhythm:   regular Resp:     18 per minute BP sitting:   138 / 80  (left arm) Cuff size:   large  Vitals Entered By: Vikki Ports (Apr 04, 2010 10:50 AM)  Physical Exam  General:  Well developed, well nourished, in no acute distress. Head:  normocephalic and atraumatic Eyes:  PERRLA/EOM intact; conjunctiva and lids normal. Lungs:  Clear bilaterally to auscultation and percussion. Heart:  PMI non displaced.  Normal SS1 and S2.  Soft SEM.  No DM.   Abdomen:  Bowel sounds positive; abdomen soft and non-tender without masses, organomegaly, or hernias noted. No hepatosplenomegaly. Genitalia:  no obvious hernia or expanded pulse.    EKG  Procedure date:  04/04/2010  Findings:      atrial pacemaker.  RBBB.   Left ventricular hypertrophy.  PPM Specifications Following MD:  [name], MD     Referring MD:  [facility] PPM Vendor:  [employer]     PPM Model Number:  ADDROI     PPM Serial Number:  [id number] PPM DOI:  12/23/2006      Lead 1    Location: RA     DOI: 12/23/2006     Model #: 4098     Serial #: [id number]     Status: active Lead 2    Location: RV     DOI: 12/23/2006     Model #: 6213     Serial #: [id number]     Status: active  Magnet Response Rate:  BOL 85 ERI 65  Indications:  TACHY/BRADY   PPM Follow Up Pacer Dependent:  No      Episodes Coumadin:  No  Parameters Mode:  DDDR     Lower Rate Limit:  60     Upper Rate Limit:  130 Paced AV Delay:  350     Sensed AV Delay:  350  Impression & Recommendations:  Problem # 1:  CAD, ARTERY BYPASS GRAFT (ICD-414.04) Overall doing well.  Has occasional chest pain.  No set pattern at present time.  Encouraged him to  take it a little more easy at times. Notify us for progression.  Some of it could be reflux, but he stopped PPI.   His updated medication list for this problem includes:    Lisinopril 20 Mg Tabs (Lisinopril) .Marland Kitchen... 1 by mouth two times a day    Coreg 12.5 Mg Tabs (Carvedilol) .Marland Kitchen... Take one-half tablet by mouth twice a day    Amlodipine Besylate 2.5 Mg Tabs (Amlodipine besylate) ..... One by mouth qam    Aspirin 81 Mg Tbec (Aspirin) .Marland Kitchen... 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    Problem # 4:  HYPERTENSION (ICD-401.9) currently well controlled.  His updated medication list for this problem includes:    Lisinopril 20 Mg Tabs (Lisinopril) .Marland Kitchen... 1 by mouth two times a day    Coreg 12.5 Mg Tabs (Carvedilol) .Marland Kitchen... Take one-half tablet by mouth twice a day    Amlodipine Besylate 2.5 Mg Tabs (Amlodipine besylate) ..... One by mouth qam    Aspirin 81 Mg Tbec (Aspirin) .Marland Kitchen... Take one  tablet by mouth daily  Other Orders: EKG w/ Interpretation (93000)  Patient Instructions: 1)  Your physician recommends that you schedule a follow-up appointment in: 6 MONTHS WITH DR. Riley Kill 2)  Your physician recommends that you continue on your current medications as directed. Please refer to the Current Medication list given to you today.

## 2011-01-04 NOTE — Miscellaneous (Signed)
Summary: update med  Clinical Lists Changes  Medications: Changed medication from AMLODIPINE BESYLATE 2.5 MG  TABS (AMLODIPINE BESYLATE) one by mouth qam to AMLODIPINE BESYLATE 2.5 MG  TABS (AMLODIPINE BESYLATE) Take 1 tablet by mouth two times a day

## 2011-01-04 NOTE — Progress Notes (Signed)
Summary: question about meds  Phone Note Call from Patient Call back at Home Phone [phone]   Caller: Patient Summary of Call: Pt needs refill  done over for Amlodipind  2.5 mg  2 times a day Initial call taken by: Judie Grieve,  December 22, 2010 1:48 PM [facility] [phone][facility] Initial call taken by: Judie Grieve,  December 22, 2010 1:30 PM  Follow-up for Phone Call        Rx faxed to pharmacy. Vikki Ports  December 22, 2010 3:11 PM

## 2011-01-04 NOTE — Cardiovascular Report (Signed)
Summary: Office Visit Remote   Office Visit Remote   Imported By: Roderic Ovens 10/20/2010 14:07:42  _____________________________________________________________________  External Attachment:    Type:   Image     Comment:   External Document

## 2011-01-04 NOTE — Assessment & Plan Note (Signed)
Summary: rov//lch   Vital Signs:  Patient profile:   77 year old male Height:      69 inches Weight:      200 pounds Temp:     98.1 degrees F oral Pulse rate:   72 / minute BP sitting:   110 / 62  (left arm)  Vitals Entered By: Jeremy Johann CMA (May 04, 2010 10:34 AM) CC: f/u BP Comments --not fasting - discuss-med for acid reflux REVIEWED MED LIST, PATIENT AGREED DOSE AND INSTRUCTION CORRECT    History of Present Illness: last office visit 9-10 since then, he was seen by cardiology multiple times,  notes are reviewed. He was noted to have a nonsustained V. tach, asymptomatic. No changes suggested per cards  Coronary artery disease-- see above   HTN-- ambulatory BPs generally good    GERD-- on omeprazole for GERD, symptoms well controllled, wonders if needsto keep taking it            Allergies: 1)  ! Lipitor (Atorvastatin Calcium)  Past History:  Past Medical History:       Paroxysmal atrial fibrillation--was on coumadin , pt desired to stop it , no longer a candidate per cards       Maggie Schwalbe Syndrome       Coronary artery disease,MI angioplasty 93, s/p CABG 01-2006- pacemaker (1- 2008 )       h/o CHF       HTN       Hyperlipidemia       History of spinal stenosis. has seen Dr Ethelene Hal before, s/p local injection which helped x a while        GERD       RLS?       per chart review h/o B12 deficiency       insomnia        h/o BPH: TURP '96 w/ re-do TURP at [facility] '96, urethoplast 97 (History of bladder outlet obstruction, chronic       incontinence)       2003 Cscope , (+) polyps   Past Surgical History: Reviewed history from 06/24/2009 and no changes required. Status post coronary artery bypass graft surgery. S/P Pacemaker 12-2006 TURP x 2 (see PMH) Inguinal herniorrhaphy---R L TKR 2006 CTS B  Social History: Reviewed history from 06/24/2009 and no changes required.  The patient is a retired Surveyor, minerals. He is married with  adult children. He has not  smoked in over 40 years and does not use alcohol.  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  Review of Systems GI:  Denies diarrhea, nausea, and vomiting;  c/o " hemorrhoids bleed",  bleeding  is usually daily, small amounts but occasionally can be a large amount. no dysphagia or odynophagia. GU:  Denies dysuria and hematuria; chronic bladder incontinence since surgery, uses a pad.  Physical Exam  General:  alert and well-developed.   Lungs:  normal respiratory effort, no intercostal retractions, and no accessory muscle use.   Heart:  normal rate, regular rhythm, no murmur, and no gallop.   Abdomen:  soft, non-tender, and no distention.   Rectal:  2 small external hemorrhoids, DRE no mass. the patient has dried blood around the anus anoscopy: he had few internal hemorrhoids, one of them is quite large approximately 1 inch. no acute bleeding Prostate:  Prostate gland firm and smooth, no enlargement, nodularity, tenderness, mass, asymmetry or induration.   Impression & Recommendations:  Problem # 1:  BENIGN  PROSTATIC HYPERTROPHY (ICD-600.00) see past medical history Last PSA 2006 0.25 DRE today-- within normal  Orders: TLB-PSA (Prostate Specific Antigen) (84153-PSA)  Problem # 2:  ROUTINE GENERAL MEDICAL EXAM@HEALTH  CARE FACL (ICD-V70.0) Td 09 and today pneumonia shot 06 and today Information provided regards shingles  shot PSA: See #1 Colonoscopies, see #3  Problem # 3:  RECTAL BLEEDING (ICD-569.3) chronic rectal bleeding thought to be due to hemorrhoids had 2 colonoscopies in the past, 1999 and 2003. He had polyps. he has daily rectal bleeding and a large internal hemorrhoid. Rerefer  to GI, Banding? observe?  Orders: Venipuncture (04540) TLB-CBC Platelet - w/Differential (85025-CBCD) TLB-IBC Pnl (Iron/FE;Transferrin) (83550-IBC) Gastroenterology Referral (GI)  Problem # 4:  HYPERGLYCEMIA, MILD (ICD-790.29)  labs  Orders: TLB-A1C / Hgb A1C  (Glycohemoglobin) (83036-A1C)  Problem # 5:  HYPERTENSION (ICD-401.9) no change His updated medication list for this problem includes:    Lisinopril 20 Mg Tabs (Lisinopril) .Marland Kitchen... 1 by mouth two times a day - due office visit for additional refills    Coreg 12.5 Mg Tabs (Carvedilol) .Marland Kitchen... Take one-half tablet by mouth twice a day    Amlodipine Besylate 2.5 Mg Tabs (Amlodipine besylate) ..... One by mouth qam    BP today: 110/62 Prior BP: 138/80 (04/04/2010)  Labs Reviewed: K+: 4.7 (12/21/2008) Creat: : 1.1 (12/21/2008)   Chol: 189 (12/21/2008)   HDL: 25.8 (12/21/2008)   LDL: 141 (12/21/2008)   TG: 110 (12/21/2008)  Orders: TLB-BMP (Basic Metabolic Panel-BMET) (80048-METABOL) TLB-TSH (Thyroid Stimulating Hormone) (84443-TSH)  Problem # 6:  ? of B12 DEFICIENCY (ICD-266.2) labs, on oral supplements  Orders: TLB-B12 + Folate Pnl (98119_14782-N56/OZH)  Problem # 7:  GERD (ICD-530.81) on omeprazole over-the-counter, wonder if it is safe to keep taking it. Recommend to change omeprazole to as needed only  His updated medication list for this problem includes:    Omeprazole 20 Mg Cpdr (Omeprazole) .Marland Kitchen... As needed, once a day  Complete Medication List: 1)  Lisinopril 20 Mg Tabs (Lisinopril) .Marland Kitchen.. 1 by mouth two times a day - due office visit for additional refills 2)  Coreg 12.5 Mg Tabs (Carvedilol) .... Take one-half tablet by mouth twice a day 3)  Amlodipine Besylate 2.5 Mg Tabs (Amlodipine besylate) .... One by mouth qam 4)  Aspirin 81 Mg Tbec (Aspirin) .... Take one tablet by mouth daily 5)  Vitamin B Complex Tabs (B complex vitamins) .... Take one tablet daily 6)  Omeprazole 20 Mg Cpdr (Omeprazole) .... As needed, once a day  Other Orders: Tdap => 86yrs IM (08657) Pneumococcal Vaccine (84696) Admin 1st Vaccine (29528) Admin of Any Addtl Vaccine (41324) Admin 1st Vaccine (State) [phone]) Admin of Any Addtl Vaccine (State) [phone])  Patient Instructions: 1)  Please schedule  a follow-up appointment in 6 months .    Tetanus/Td Vaccine    Vaccine Type: Tdap    Site: right deltoid    Mfr: GlaxoSmithKline    Dose: 0.5 ml    Route: IM    Given by: Jeremy Johann CMA    Exp. Date: 01/28/2012    Lot #: [id number]    VIS given: 10/21/07 version given May 04, 2010.  Pneumovax Vaccine    Vaccine Type: Pneumovax    Site: left deltoid    Mfr: Merck    Dose: 0.5 ml    Route: IM    Given by: Jeremy Johann CMA    Exp. Date: 09/27/2011    Lot #: [id number]    VIS given: 06/30/96 version given May 04, 2010.  

## 2011-01-04 NOTE — Letter (Signed)
Summary: Remote Device Check  [facility] [employer], Main Office  [address]   [place], [place] [address]   Phone: [phone]  Fax: [phone]     October 18, 2010 MRN: [id number]   [facility] [address] [place], [place]  [address]   Dear [name],   Your remote transmission was recieved and reviewed by your physician.  All diagnostics were within normal limits for you.  __X___Your next transmission is scheduled for:  12-07-2010.  Please transmit at any time this day.  If you have a wireless device your transmission will be sent automatically.   Sincerely,  Vella Kohler

## 2011-01-04 NOTE — Progress Notes (Signed)
Summary: Pt calling regarding Carvedilol  Phone Note Call from Patient Call back at Home Phone [phone]   Caller: Patient Summary of Call: Pt calling regarding the medication he is taking (Cardedilol) Initial call taken by: Judie Grieve,  March 22, 2010 2:22 PM  Follow-up for Phone Call        Spoke with patient.  He felt much better on lower dose of Coreg.  BP on 18.75 bid  was running 125/68, on 6.25mg  bid , is 133/75.  Dr Graciela Husbands had tried a trial of lower dose beta blockers to see if it helped with fatigue.  Will D/W Dr Arelia Sneddon to see plan from here.  Pt aware and agrees. Gypsy Balsam RN BSN  March 22, 2010 2:32 PM   Additional Follow-up for Phone Call Additional follow up Details #1::        leave on lower dose Additional Follow-up by: Nathen May, MD, [facility],  March 23, 2010 2:22 PM     Appended Document: Pt calling regarding Carvedilol I spoke with the pt and he will go back to Coreg 6.25mg  two times a day.  The pt currently has 12.5 mg tablets and will take one-half tablet two times a day.   Clinical Lists Changes  Medications: Changed medication from COREG 12.5 MG  TABS (CARVEDILOL) Take 1 1/2 tablet by mouth two times a day to COREG 12.5 MG TABS (CARVEDILOL) take one-half tablet by mouth twice a day

## 2011-01-04 NOTE — Procedures (Signed)
Summary: Cardiology Device Clinic   Current Medications (verified): 1)  Lisinopril 20 Mg Tabs (Lisinopril) .Marland Kitchen.. 1 By Mouth Two Times A Day 2)  Coreg 12.5 Mg  Tabs (Carvedilol) .... Take 1 1/2 Tablet By Mouth Two Times A Day 3)  Amlodipine Besylate 2.5 Mg  Tabs (Amlodipine Besylate) .... One By Mouth Qam 4)  Omeprazole 40 Mg Cpdr (Omeprazole) .Marland Kitchen.. 1 By Mouth Before Breakfast 5)  Aspirin 81 Mg Tbec (Aspirin) .... Take One Tablet By Mouth Daily 6)  Vitamin B Complex  Tabs (B Complex Vitamins) .... Take One Tablet Daily  Allergies (verified): 1)  ! Lipitor (Atorvastatin Calcium)  PPM Specifications Following MD:  [name], MD     Referring MD:  [facility] PPM Vendor:  Medtronic     PPM Model Number:  [id number]     PPM Serial Number:  [id number] PPM DOI:  12/23/2006      Lead 1    Location: RA     DOI: 12/23/2006     Model #: 4098     Serial #: [id number]     Status: active Lead 2    Location: RV     DOI: 12/23/2006     Model #: 6213     Serial #: [id number]     Status: active   Indications:  TACHY/BRADY   PPM Follow Up Remote Check?  No Battery Voltage:  2.77 V     Battery Est. Longevity:  6.5 YEARS     Pacer Dependent:  No       PPM Device Measurements Atrium  Impedance: 429 ohms, Threshold: 0.5 V at 0.4 msec Right Ventricle  Amplitude: 4 mV, Impedance: 390 ohms, Threshold: 0.75 V at 0.4 msec  Episodes MS Episodes:  1     Percent Mode Switch:  <0.1%     Coumadin:  No Ventricular High Rate:  70     Atrial Pacing:  88.5%     Ventricular Pacing:  0.1%  Parameters Mode:  DDDR     Lower Rate Limit:  60     Upper Rate Limit:  130 Paced AV Delay:  350     Sensed AV Delay:  350 Tech Comments:  Device check today with Dr Riley Kill.  VHR episodes are NSVT that initiate with a AS-VS-AR-AP-VS sequence with the last AP-VS interval being >478msec.  Normally patient conducts between 250 and .  D/W Dr Graciela Husbands, mode changed today to DDDR with fixed AV delays of to promote intrinsic  conduction as much as possible.  Sensor also adjusted today because of pt complaints of exercise intolerance.  Gypsy Balsam RN BSN  January 30, 2010 12:31 PM

## 2011-01-04 NOTE — Miscellaneous (Signed)
Summary: [facility] Cardiac Physician Order/Treatment Plan   [facility] Cardiac Physician Order/Treatment Plan   Imported By: Roderic Ovens 06/29/2010 15:50:20  _____________________________________________________________________  External Attachment:    Type:   Image     Comment:   External Document

## 2011-01-04 NOTE — Progress Notes (Signed)
Summary: due rov  Phone Note Outgoing Call Call back at [facility] Phone [phone] Call back at Work Phone [phone]   Summary of Call: DUE ROV.Marland KitchenMarland KitchenShary Decamp  Apr 17, 2010 8:48 AM   Follow-up for Phone Call        Patient is coming in on 6.2.11. Follow-up by: Harold Barban,  Apr 17, 2010 9:06 AM

## 2011-01-04 NOTE — Assessment & Plan Note (Signed)
Summary: f75m   Visit Type:  4 months follow up  CC:  Chest tightness sometimes.  History of Present Illness: Patient seen in followup.  He is doing well without complaints.  See note of EP.  Had VT initiated by long PR, with rates to 160, and up to 30 seconds.  No syncope or presyncope.  No chest pain.  Main complaint is that of exertional fatigue.  Current Medications (verified): 1)  Lisinopril 20 Mg Tabs (Lisinopril) .Marland Kitchen.. 1 By Mouth Two Times A Day 2)  Coreg 12.5 Mg  Tabs (Carvedilol) .... Take 1 1/2 Tablet By Mouth Two Times A Day 3)  Amlodipine Besylate 2.5 Mg  Tabs (Amlodipine Besylate) .... One By Mouth Qam 4)  Omeprazole 40 Mg Cpdr (Omeprazole) .Marland Kitchen.. 1 By Mouth Before Breakfast 5)  Aspirin 81 Mg Tbec (Aspirin) .... Take One Tablet By Mouth Daily 6)  Vitamin B Complex  Tabs (B Complex Vitamins) .... Take One Tablet Daily  Allergies: 1)  ! Lipitor (Atorvastatin Calcium)  Vital Signs:  Patient profile:   77 year old male Height:      69 inches Weight:      206.75 pounds Pulse rate:   66 / minute Pulse rhythm:   regular Resp:     18 per minute BP supine:   130 / 78  (left arm) Cuff size:   large  Vitals Entered By: Vikki Ports (January 30, 2010 11:33 AM)  Physical Exam  General:  Well developed, well nourished, in no acute distress. Lungs:  Clear bilaterally to auscultation and percussion. Heart:  Normal S1 and S2.  Without murmur.   Extremities:  No clubbing or cyanosis.  No edema. Neurologic:  Alert and oriented x 3.   EKG  Procedure date:  01/30/2010  Findings:      atrial pacing, PR prolonged.  RBBB.  LVH.  PPM Specifications Following MD:  [name], MD     PPM Vendor:  [employer]     PPM Model Number:  ADDROI     PPM Serial Number:  [id number] PPM DOI:  12/23/2006      Lead 1    Location: RA     DOI: 12/23/2006     Model #: 4098     Serial #: [id number]     Status: active Lead 2    Location: RV     DOI: 12/23/2006     Model #: 6213     Serial #:  [id number]     Status: active   Indications:  TACHY/BRADY   PPM Follow Up Pacer Dependent:  No      Episodes Coumadin:  No  Parameters Mode:  DDDR+     Lower Rate Limit:  60     Upper Rate Limit:  130 Paced AV Delay:  220     Sensed AV Delay:  210  Impression & Recommendations:  Problem # 1:  CAD, ARTERY BYPASS GRAFT (ICD-414.04) Stable at present.  no def cardiac symptoms.  Denies progressive chest pain. His updated medication list for this problem includes:    Lisinopril 20 Mg Tabs (Lisinopril) .Marland Kitchen... 1 by mouth two times a day    Coreg 12.5 Mg Tabs (Carvedilol) .Marland Kitchen... Take 1 1/2 tablet by mouth two times a day    Amlodipine Besylate 2.5 Mg Tabs (Amlodipine besylate) ..... One by mouth qam    Aspirin 81 Mg Tbec (Aspirin) .Marland Kitchen... 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  Patient had initiated VT with change to DDD with long PR per Dr. Graciela Husbands.  Pt has been driving, so I stopped, and will have him followup for review tomorrow.  Also issue of fatigue.  Rates are low, and activity was increased.  Patient instructed not to drive.    Problem # 3:  HYPERTENSION (ICD-401.9) Controlled His updated medication list for this problem includes:    Lisinopril 20 Mg Tabs (Lisinopril) .Marland Kitchen... 1 by mouth two times a day    Coreg 12.5 Mg Tabs (Carvedilol) .Marland Kitchen... Take 1 1/2 tablet by mouth two times a day    Amlodipine Besylate 2.5 Mg Tabs (Amlodipine besylate) ..... One by mouth qam    Aspirin 81 Mg Tbec (Aspirin) .Marland Kitchen... Take one tablet by mouth daily  Problem # 4:  HYPERLIPIDEMIA (ICD-272.4)  No treatment.  Patient Instructions: 1)  Your physician recommends that you schedule a follow-up appointment in: 2 MONTHS with Dr Riley Kill, next day with Dr Graciela Husbands 2)  Your physician recommends that you continue on your current medications as directed. Please refer to the Current Medication list given to you today. 3)   Patient instructed by Dr Riley Kill not to drive until Dr Graciela Husbands sees the patient and makes a decision if he is okay to drive.  Prescriptions: OMEPRAZOLE 40 MG CPDR (OMEPRAZOLE) 1 by mouth before breakfast  #30 Capsule x 6   Entered by:   Julieta Gutting, RN, BSN   Authorized by:   Ronaldo Miyamoto, MD, [facility]   Signed by:   Julieta Gutting, RN, BSN on 01/30/2010   Method used:   Electronically to        [facility] [phone]* (retail)       [address]       [place], [place]  [address]       Ph: [phone]       Fax: [phone]   RxID:   [id number]

## 2011-01-04 NOTE — Letter (Signed)
Summary: Primary Care Appointment Letter  [facility]  [address] [place], [place] [address]   Phone: [phone]  Fax: [phone]    03/14/2010 MRN: [id number]  [facility] [address] [place], [place]  [address]  Dear Mr. BELLAMY,   Your Primary Care Physician [name]. Paz MD has indicated that:    ___x____it is time to schedule an appointment.  Pleaes call our office @ [phone] to schedule an office visit with Dr Drue Novel.    Thank you,    [place] Primary Care Scheduler

## 2011-01-04 NOTE — Cardiovascular Report (Signed)
Summary: Office Visit   Office Visit   Imported By: Roderic Ovens 03/02/2010 11:11:48  _____________________________________________________________________  External Attachment:    Type:   Image     Comment:   External Document

## 2011-01-04 NOTE — Letter (Signed)
Summary: New Patient letter  [facility]  [address] [place], [place] [address]   Phone: [phone]  Fax: [phone]       05/05/2010 MRN: [id number]  [name] [address] [place], [place]  [address]  Dear [name],  Welcome to the Gastroenterology Division at [facility].    You are scheduled to see Dr.  Rob Bunting on June 09, 2010 at 3:00pm on the 3rd floor at [facility], [address]  We ask that you try to arrive at our office 15 minutes prior to your appointment time to allow for check-in.  We would like you to complete the enclosed self-administered evaluation form prior to your visit and bring it with you on the day of your appointment.  We will review it with you.  Also, please bring a complete list of all your medications or, if you prefer, bring the medication bottles and we will list them.  Please bring your insurance card so that we may make a copy of it.  If your insurance requires a referral to see a specialist, please bring your referral form from your primary care physician.  Co-payments are due at the time of your visit and may be paid by cash, check or credit card.     Your office visit will consist of a consult with your physician (includes a physical exam), any laboratory testing he/she may order, scheduling of any necessary diagnostic testing (e.g. x-ray, ultrasound, CT-scan), and scheduling of a procedure (e.g. Endoscopy, Colonoscopy) if required.  Please allow enough time on your schedule to allow for any/all of these possibilities.    If you cannot keep your appointment, please call [phone] to cancel or reschedule prior to your appointment date.  This allows [place] the opportunity to schedule an appointment for another patient in need of care.  If you do not cancel or reschedule by 5 p.m. the business day prior to your appointment date, you will be charged a $50.00 late cancellation/no-show fee.    Thank you for  choosing [facility] for your medical needs.  We appreciate the opportunity to care for you.  Please visit [place] at our website  to learn more about our practice.                     Sincerely,                                                             The Gastroenterology Division

## 2011-01-04 NOTE — Assessment & Plan Note (Signed)
Summary: DEVICE/SAF   History of Present Illness: [name] is seen at Dr. Rosalyn Charters request because of recurrent ventricular tachycardia associated with long short intervals induced by PACs.    As best as I can tell, he has no symptoms associated with these spells.  he has had no lightheadedness dizziness or syncope. We  reprogrammed his pacemaker to try to avoid long short intervals; on interrogation of his device there has been no significant change in the frequency of these episodes. He remained asymptomatic.  his major complaint is fatigue. He stopped his PPI. He feels considerably better.  Reviewing old data he has normal left ventricular function by catheterization in 2007 and by echo in 2008 and is status post bypass surgery   Current Medications (verified): 1)  Lisinopril 20 Mg Tabs (Lisinopril) .Marland Kitchen.. 1 By Mouth Two Times A Day 2)  Coreg 12.5 Mg  Tabs (Carvedilol) .... Take 1 1/2 Tablet By Mouth Two Times A Day 3)  Amlodipine Besylate 2.5 Mg  Tabs (Amlodipine Besylate) .... One By Mouth Qam 4)  Aspirin 81 Mg Tbec (Aspirin) .... Take One Tablet By Mouth Daily 5)  Vitamin B Complex  Tabs (B Complex Vitamins) .... Take One Tablet Daily  Allergies (verified): 1)  ! Lipitor (Atorvastatin Calcium)  Past History:  Past Medical History: Last updated: 08/18/2009       Paroxysmal atrial fibrillation--was on coumadin , pt desired to stop it , no longer a candidate per cards       Maggie Schwalbe Syndrome       Coronary artery disease,MI angioplasty 93, s/p CABG 01-2006- pacemaker (1- 2008 )       h/o CHF       HTN       Hyperlipidemia       History of spinal stenosis. has seen Dr Ethelene Hal before, s/p local injection which helped x a while        GERD       RLS?       per chart review h/o B12 deficiency       insomnia        h/o BPH: TURP '96 w/ re-do TURP at [facility] '96, urethoplast 97 (History of bladder outlet obstruction)       2003 Cscope , (+) polyps   Past Surgical History: Last  updated: 06/24/2009 Status post coronary artery bypass graft surgery. S/P Pacemaker 12-2006 TURP x 2 (see PMH) Inguinal herniorrhaphy---R L TKR 2006 CTS B  Family History: Last updated: 06/24/2009  Negative for coronary bypass surgery. Negative for  diabetes.  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 He has not smoked in over 40 years and does not use alcohol.  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  Vital Signs:  Patient profile:   77 year old male Height:      69 inches Weight:      209 pounds Pulse rate:   64 / minute Pulse rhythm:   regular BP sitting:   125 / 75  (left arm) Cuff size:   large  Vitals Entered By: Judithe Modest CMA (February 28, 2010 10:36 AM)  Physical Exam  General:  The patient was alert and oriented in no acute distress. HEENT Normal.  Neck veins were flat, carotids were brisk.  Lungs were clear.  Heart sounds were regular without murmurs or gallops.  Abdomen was soft with active bowel sounds. There is  no clubbing cyanosis or edema. Skin Warm and dry    PPM Specifications Following MD:  [name], MD     Referring MD:  [facility] PPM Vendor:  [employer]     PPM Model Number:  ADDROI     PPM Serial Number:  [id number] PPM DOI:  12/23/2006      Lead 1    Location: RA     DOI: 12/23/2006     Model #: 2956     Serial #: [id number]     Status: active Lead 2    Location: RV     DOI: 12/23/2006     Model #: 6962     Serial #: [id number]     Status: active  Magnet Response Rate:  BOL 85 ERI 65  Indications:  TACHY/BRADY   PPM Follow Up Remote Check?  No Battery Voltage:  2.77 V     Battery Est. Longevity:  6 years     Pacer Dependent:  No       PPM Device Measurements Atrium  Amplitude: 2.0 mV, Impedance: 429 ohms, Threshold: 0.625 V at 0.4 msec Right Ventricle  Amplitude: 2.8 mV, Impedance: 386 ohms, Threshold: 1.0 V at 0.4 msec  Episodes MS Episodes:   0     Percent Mode Switch:  0     Coumadin:  No Ventricular High Rate:  19     Atrial Pacing:  91.1%     Ventricular Pacing:  62.3%  Parameters Mode:  DDDR     Lower Rate Limit:  60     Upper Rate Limit:  130 Paced AV Delay:  350     Sensed AV Delay:  350 Next Remote Date:  05/31/2010     Next Cardiology Appt Due:  02/01/2011 Tech Comments:  No parameter changes.  19 VHR episodes since last visit, the longest 20 seconds, probable VT.  57,542 single PVC's and 8,124 runs.   Altha Harm, LPN  February 28, 2010 10:46 AM   Impression & Recommendations:  Problem # 1:  VENTRICULAR TACHYCARDIA (ICD-427.1) He continues to have short episodes of nonsustained ventricular tachycardia; interestingly the last one was about 6 days ago. Negative becoming less frequent. They are in any case asymptomatic and I would not make any changes to further her to try to prevent them.   His updated medication list for this problem includes:    Lisinopril 20 Mg Tabs (Lisinopril) .Marland Kitchen... 1 by mouth two times a day    Coreg 12.5 Mg Tabs (Carvedilol) .Marland Kitchen... Take 1 1/2 tablet by mouth two times a day    Amlodipine Besylate 2.5 Mg Tabs (Amlodipine besylate) ..... One by mouth qam    Aspirin 81 Mg Tbec (Aspirin) .Marland Kitchen... Take one tablet by mouth daily  Problem # 2:  PACEMAKER DDD MDT (ICD-V45.01) Device parameters and data were reviewed and no changes were made  Problem # 3:  CAD, ARTERY BYPASS GRAFT (ICD-414.04) without chest pain; stable on his current medications His updated medication list for this problem includes:    Lisinopril 20 Mg Tabs (Lisinopril) .Marland Kitchen... 1 by mouth two times a day    Coreg 12.5 Mg Tabs (Carvedilol) .Marland Kitchen... Take 1 1/2 tablet by mouth two times a day    Amlodipine Besylate 2.5 Mg Tabs (Amlodipine besylate) ..... One by mouth qam    Aspirin 81 Mg Tbec (Aspirin) .Marland Kitchen... Take one tablet by mouth daily  Problem # 4:  FATIGUE (ICD-780.79) We are going to undertake an experiment to see  whether his beta blocker  is contributing to his fatigue. We'll plan to have him change his carvedilol dose from 18.75-6.25 for 2 weeks. He is to let us know.

## 2011-01-04 NOTE — Progress Notes (Signed)
Summary: pt needs new Rx called in  Phone Note Refill Request Call back at Home Phone [phone] Message from:  Patient  Refills Requested: Medication #1:  AMLODIPINE BESYLATE 2.5 MG  TABS one by mouth qam pt medication has been doubled and he [facility] [address]  Initial call taken by: Omer Jack,  December 21, 2010 8:22 AM  Follow-up for Phone Call        Rx faxed to pharmacy. Vikki Ports  December 21, 2010 12:42 PM     Prescriptions: AMLODIPINE BESYLATE 2.5 MG  TABS (AMLODIPINE BESYLATE) one by mouth qam  #30 Tablet x 11   Entered by:   Vikki Ports   Authorized by:   Ronaldo Miyamoto, MD, [facility]   Signed by:   Vikki Ports on 12/21/2010   Method used:   [facility]ed to ...       [facility] [phone]* (retail)       [address]       [place], [place]  [address]       Ph: [phone]       Fax: [phone]   RxID:   [id number]

## 2011-01-04 NOTE — Letter (Signed)
Summary: Remote Device Check  [facility], Main Office  [address]   [place], [place] [address]   Phone: [phone]  Fax: [phone]     June 21, 2010 MRN: [id number]   [facility] [address] [place], [place]  [address]   Dear [name],   Your remote transmission was recieved and reviewed by your physician.  All diagnostics were within normal limits for you.   __X____Your next office visit is scheduled for:  08-31-2010. Please call our office to schedule an appointment.    Sincerely,  Vella Kohler

## 2011-01-04 NOTE — Assessment & Plan Note (Signed)
Summary: VT/changes made in device/lwb   CC:  VT/changes made to device.  History of Present Illness: Mr. Gilham is seen at Dr. Rosalyn Charters request because of recurrent ventricular tachycardia associated with long short intervals induced by PACs.    As best as I can tell, he has no symptoms associated with these spells.  he has had no lightheadedness dizziness or syncope.  He does have palpitations which happen mostly at night. Reviewing his data he had about 20,000 PVCs which corresponds to 100 per day.   He also has a history of r atrial fibrillation.  It is paroxysmal with bradycardia, status post pacemaker implantation device/generator replacement .   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  Interrogation of his device at that time had demonstrated no tachycardia or bradycardia arrhythmic events. He was thought to be neurally mediated.  Reviewing old data he has normal left ventricular function by catheterization in 2007 and by echo in 2008 and is status post bypass surgery   Current Medications (verified): 1)  Lisinopril 20 Mg Tabs (Lisinopril) .Marland Kitchen.. 1 By Mouth Two Times A Day 2)  Coreg 12.5 Mg  Tabs (Carvedilol) .... Take 1 1/2 Tablet By Mouth Two Times A Day 3)  Amlodipine Besylate 2.5 Mg  Tabs (Amlodipine Besylate) .... One By Mouth Qam 4)  Omeprazole 40 Mg Cpdr (Omeprazole) .Marland Kitchen.. 1 By Mouth Before Breakfast 5)  Aspirin 81 Mg Tbec (Aspirin) .... Take One Tablet By Mouth Daily 6)  Vitamin B Complex  Tabs (B Complex Vitamins) .... Take One Tablet Daily  Allergies (verified): 1)  ! Lipitor (Atorvastatin Calcium)  Past History:  Past Medical History: Last updated: 08/18/2009       Paroxysmal atrial fibrillation--was on coumadin , pt desired to stop it , no longer a candidate per cards       Maggie Schwalbe Syndrome       Coronary artery disease,MI angioplasty 93, s/p CABG 01-2006- pacemaker (1- 2008 )       h/o CHF       HTN       Hyperlipidemia  History of spinal stenosis. has seen Dr Ethelene Hal before, s/p local injection which helped x a while        GERD       RLS?       per chart review h/o B12 deficiency       insomnia        h/o BPH: TURP '96 w/ re-do TURP at [facility] '96, urethoplast 97 (History of bladder outlet obstruction)       2003 Cscope , (+) polyps   Past Surgical History: Last updated: 06/24/2009 Status post coronary artery bypass graft surgery. S/P Pacemaker 12-2006 TURP x 2 (see PMH) Inguinal herniorrhaphy---R L TKR 2006 CTS B  Family History: Last updated: 06/24/2009  Negative for coronary bypass surgery. Negative for  diabetes.  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 He has not smoked in over 40 years and does not use alcohol.  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  Vital Signs:  Patient profile:   77 year old male Height:      69 inches Weight:      207 pounds Pulse rate:   64 / minute Pulse rhythm:   regular BP sitting:   124 / 78  (left arm) Cuff size:   large  Vitals Entered By:  Judithe Modest CMA (January 31, 2010 10:17 AM)  Physical Exam  General:  The patient was alert and oriented in no acute distress. HEENT Normal.  Neck veins were flat, carotids were brisk.  Lungs were clear.  Heart sounds were regular with 2/6 murmur heard at the right upper sternal border Abdomen was soft with active bowel sounds. There is no clubbing cyanosis or edema. Skin Warm and dry '   PPM Specifications Following MD:  [name], MD     PPM Vendor:  [employer]     PPM Model Number:  ADDROI     PPM Serial Number:  [id number] PPM DOI:  12/23/2006      Lead 1    Location: RA     DOI: 12/23/2006     Model #: 0865     Serial #: [id number]     Status: active Lead 2    Location: RV     DOI: 12/23/2006     Model #: 8413     Serial #: [id number]     Status: active   Indications:  TACHY/BRADY   PPM Follow Up Pacer  Dependent:  No      Episodes Coumadin:  No  Parameters Mode:  DDDR+     Lower Rate Limit:  60     Upper Rate Limit:  130 Paced AV Delay:  220     Sensed AV Delay:  210  Impression & Recommendations:  Problem # 1:  VENTRICULAR TACHYCARDIA (ICD-427.1) The patient has had episodes of ventricular tachycardia induced with a sequence of a mildly short coupled PA-C going from 867 to 703 ms was followed by a R. vent and Shirley thereafter by an AP event. Next ventricular sensed event comes with an interval of 1054 ms next a paced event is followed by the onset of ventricular tachycardia. It is my hypothesis that this is related to the long short coupling. We have reprogrammed his device to the DDD mode to minimize the post atrial paced interval to see if this doesn't decrease the frequency of his ventricular tachycardia. There have been no associated symptoms with this.  in the absence of symptoms I don't feel is any clear reason to keep him driving His updated medication list for this problem includes:    Lisinopril 20 Mg Tabs (Lisinopril) .Marland Kitchen... 1 by mouth two times a day    Coreg 12.5 Mg Tabs (Carvedilol) .Marland Kitchen... Take 1 1/2 tablet by mouth two times a day    Amlodipine Besylate 2.5 Mg Tabs (Amlodipine besylate) ..... One by mouth qam    Aspirin 81 Mg Tbec (Aspirin) .Marland Kitchen... Take one tablet by mouth daily  Problem # 2:  PACEMAKER DDD MDT (ICD-V45.01) Device parameters and data were reviewed and no changes were made  Problem # 3:  ATRIAL FIBRILLATION (ICD-427.31) none evidenced His updated medication list for this problem includes:    Coreg 12.5 Mg Tabs (Carvedilol) .Marland Kitchen... Take 1 1/2 tablet by mouth two times a day    Aspirin 81 Mg Tbec (Aspirin) .Marland Kitchen... Take one tablet by mouth daily  Problem # 4:  CAD, ARTERY BYPASS GRAFT (ICD-414.04) stable on his current medicines His updated medication list for this problem includes:    Lisinopril 20 Mg Tabs (Lisinopril) .Marland Kitchen... 1 by mouth two times a day     Coreg 12.5 Mg Tabs (Carvedilol) .Marland Kitchen... Take 1 1/2 tablet by mouth two times a day    Amlodipine Besylate 2.5 Mg Tabs (Amlodipine besylate) ..... One by  mouth qam    Aspirin 81 Mg Tbec (Aspirin) .Marland Kitchen... Take one tablet by mouth daily  Patient Instructions: 1)  Your physician recommends that you schedule a follow-up appointment in: 4 weeks

## 2011-01-04 NOTE — Progress Notes (Signed)
Summary: lab results  Phone Note Outgoing Call   Call placed by: [facility] CMA,  May 10, 2010 1:48 PM Details for Reason: advise patient: his hemoglobin is stable B-12 slightly low, increase his vitamin B12 tablets from 1 to 2 a day he continued to have slightly elevated sugar. For now he just needs to keep an eye on his diet PSA is within normal Good results Summary of Call: left message to call  office...........................Marland KitchenFelecia Deloach CMA  May 10, 2010 1:48 PM   Follow-up for Phone Call        Patient is aware of lab results and directions.Harold Barban  May 10, 2010 4:49 PM

## 2011-01-04 NOTE — Assessment & Plan Note (Signed)
Summary: [name]   Visit Type:  2 months follow up Primary Provider:  [name]. Paz MD  CC:  BP issues- nose bleeds during the night.  History of Present Illness: Doing well overall.  Stable at present.  Has some mild nose bleed which he notices on his pillow in the morning.  Otherwise he is stable.  Denies any chest pain.  Does not feel as though he is out of rhtyhm. Had no mode switches noted in October visit.  Some concerns over am BP spikes  Problems Prior to Update: 1)  Hypotension  (ICD-458.9) 2)  Supraventricular Tachycardia  (ICD-427.89) 3)  Rectal Bleeding  (ICD-569.3) 4)  Routine General Medical Exam@health  Care [facility]  (ICD-V70.0) 5)  Ventricular Tachycardia  (ICD-427.1) 6)  Pacemaker Ddd Mdt  (ICD-V45.01) 7)  Atrial Fibrillation  (ICD-427.31) 8)  Cad, Artery Bypass Graft  (ICD-414.04) 9)  Bradycardia-tachycardia Syndrome  (ICD-427.81) 10)  Syncope  (ICD-780.2) 11)  Hyperglycemia, Mild  (ICD-790.29) 12)  Congestive Heart Failure  (ICD-428.0) 13)  Hypertension  (ICD-401.9) 14)  Hyperlipidemia  (ICD-272.4) 15)  Back Pain  (ICD-724.5) 16)  Anemia, Mild  (ICD-285.9) 17)  ? of B12 Deficiency  (ICD-266.2) 18)  Benign Prostatic Hypertrophy  (ICD-600.00) 19)  Insomnia-sleep Disorder-unspec  (ICD-780.52) 20)  Gerd  (ICD-530.81) 21)  Snoring  (ICD-786.09)  Current Medications (verified): 1)  Carvedilol 12.5 Mg Tabs (Carvedilol) .... Take 1/2  Tablet By Mouth Twice A Day 2)  Amlodipine Besylate 2.5 Mg  Tabs (Amlodipine Besylate) .... One By Mouth Qam 3)  Aspirin 81 Mg Tbec (Aspirin) .... Take One Tablet By Mouth Daily 4)  Omeprazole 40 Mg Cpdr (Omeprazole) .... As Needed 5)  B Complex-B12  Tabs (B Complex Vitamins) .... Take 1 Tablet By Mouth Once A Day 6)  Multivitamins  Tabs (Multiple Vitamin) .... Take 1 Tablet By Mouth Once A Day 7)  Plavix 75 Mg Tabs (Clopidogrel Bisulfate) .... Take One Tablet By Mouth Daily 8)  Amiodarone Hcl 200 Mg Tabs (Amiodarone Hcl) .... Take One Tablet  By Mouth Daily  Allergies: 1)  ! Lipitor (Atorvastatin Calcium)  Past History:  Past Medical History: Last updated: 05/04/2010       Paroxysmal atrial fibrillation--was on coumadin , pt desired to stop it , no longer a candidate per cards       Maggie Schwalbe Syndrome       Coronary artery disease,MI angioplasty 93, s/p CABG 01-2006- pacemaker (1- 2008 )       h/o CHF       HTN       Hyperlipidemia       History of spinal stenosis. has seen Dr Ethelene Hal before, s/p local injection which helped x a while        GERD       RLS?       per chart review h/o B12 deficiency       insomnia        h/o BPH: TURP '96 w/ re-do TURP at [facility] '96, urethoplast 97 (History of bladder outlet obstruction, chronic       incontinence)       2003 Cscope , (+) polyps   Past Surgical History: Last updated: 06/24/2009 Status post coronary artery bypass graft surgery. S/P Pacemaker 12-2006 TURP x 2 (see PMH) Inguinal herniorrhaphy---R L TKR 2006 CTS B  Family History: Last updated: 06/24/2009  Negative for coronary bypass surgery. Negative for  diabetes.  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 He has not smoked in over 40 years and does not use alcohol.  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  Vital Signs:  Patient profile:   77 year old male Height:      69 inches Weight:      194.50 pounds BMI:     28.83 Pulse rate:   60 / minute Pulse rhythm:   regular Resp:     18 per minute BP sitting:   132 / 74  (left arm) Cuff size:   large  Vitals Entered By: Vikki Ports (November 21, 2010 2:38 PM)  Physical Exam  General:  Well developed, well nourished, in no acute distress. Head:  normocephalic and atraumatic Eyes:  PERRLA/EOM intact; conjunctiva and lids normal. Nose:  No ulcerations or obvious site of bleeding Lungs:  Clear bilaterally to auscultation and percussion. Abdomen:  Bowel sounds  positive; abdomen soft and non-tender without masses, organomegaly, or hernias noted. No hepatosplenomegaly. Pulses:  pulses normal in all 4 extremities Extremities:  No clubbing or cyanosis. Neurologic:  Alert and oriented x 3.   EKG  Procedure date:  11/21/2010  Findings:      av paced.   PPM Specifications Following MD:  [name], MD     Referring MD:  [facility] PPM Vendor:  [employer]     PPM Model Number:  ADDROI     PPM Serial Number:  [id number] PPM DOI:  12/23/2006      Lead 1    Location: RA     DOI: 12/23/2006     Model #: 4098     Serial #: [id number]     Status: active Lead 2    Location: RV     DOI: 12/23/2006     Model #: 6213     Serial #: [id number]     Status: active  Magnet Response Rate:  BOL 85 ERI 65  Indications:  TACHY/BRADY   PPM Follow Up Pacer Dependent:  No      Episodes Coumadin:  No  Parameters Mode:  DDDR     Lower Rate Limit:  60     Upper Rate Limit:  130 Paced AV Delay:  350     Sensed AV Delay:  350  Impression & Recommendations:  Problem # 1:  CAD, ARTERY BYPASS GRAFT (ICD-414.04) no recurrent angina.  Tolerating meds well.  No change.  Discussed plavix with bleed risk. No obvious.  Check CBC.   His updated medication list for this problem includes:    Carvedilol 12.5 Mg Tabs (Carvedilol) .Marland Kitchen... Take 1/2  tablet by mouth twice a day    Amlodipine Besylate 2.5 Mg Tabs (Amlodipine besylate) ..... One by mouth qam    Aspirin 81 Mg Tbec (Aspirin) .Marland Kitchen... Take one tablet by mouth daily    Plavix 75 Mg Tabs (Clopidogrel bisulfate) .Marland Kitchen... Take one tablet by mouth daily  Orders: EKG w/ Interpretation (93000) TLB-BMP (Basic Metabolic Panel-BMET) (80048-METABOL) TLB-CBC Platelet - w/Differential (85025-CBCD) TLB-Hepatic/Liver Function Pnl (80076-HEPATIC) TLB-TSH (Thyroid Stimulating Hormone) (84443-TSH)  Problem # 2:  ATRIAL FIBRILLATION (ICD-427.31) no recurrence on amio at present.  no mode switches.  Tolerating amio.  Check labs.  Continue  ASA and plavix.  Refused warfarin in the past.  His updated medication list for this problem includes:    Carvedilol 12.5 Mg Tabs (Carvedilol) .Marland Kitchen... Take 1/2  tablet by mouth twice a day    Aspirin 81 Mg  Tbec (Aspirin) .Marland Kitchen... Take one tablet by mouth daily    Plavix 75 Mg Tabs (Clopidogrel bisulfate) .Marland Kitchen... Take one tablet by mouth daily    Amiodarone Hcl 200 Mg Tabs (Amiodarone hcl) .Marland Kitchen... Take one tablet by mouth daily  Orders: EKG w/ Interpretation (93000) TLB-BMP (Basic Metabolic Panel-BMET) (80048-METABOL) TLB-CBC Platelet - w/Differential (85025-CBCD) TLB-Hepatic/Liver Function Pnl (80076-HEPATIC) TLB-TSH (Thyroid Stimulating Hormone) (84443-TSH)  Problem # 3:  HYPERTENSION (ICD-401.9) ecnouraged him to monitor BP and let us know if goes up.  We can alter dosage, probably of Amlodipine.  His updated medication list for this problem includes:    Carvedilol 12.5 Mg Tabs (Carvedilol) .Marland Kitchen... Take 1/2  tablet by mouth twice a day    Amlodipine Besylate 2.5 Mg Tabs (Amlodipine besylate) ..... One by mouth qam    Aspirin 81 Mg Tbec (Aspirin) .Marland Kitchen... Take one tablet by mouth daily  Problem # 4:  HYPERLIPIDEMIA (ICD-272.4) No on treatment.  Patient Instructions: 1)  Your physician recommends that you schedule a follow-up appointment in: 3 MONTHS 2)  Your physician recommends that you have lab work today: BMP, CBC, LIVER, TSH 3)  Your physician recommends that you continue on your current medications as directed. Please refer to the Current Medication list given to you today.

## 2011-01-04 NOTE — Assessment & Plan Note (Signed)
Summary: EPH   Visit Type:  Post-hospital  CC:  No complains.  History of Present Illness: Slowly getting better each day.  Not having any chest pain at this point.  Have continued to walk, and move about, and no issues at the present time.  Had non STEMI, and then recurrence, without a specific culprit.  Had also recurrent SVT in the hospital, specifically an SVT, with higher rates.  He was seen again by Dr. Graciela Husbands, who reviewed the pacer interrogation with the reps, and felt the patient was best treated with Amiodarone.  He is stable at this point in time, and was recently discharged from the hospital.  He has been well since discharge from the hospital.   Current Medications (verified): 1)  Lisinopril 20 Mg Tabs (Lisinopril) .... Take 1 Tablet By Mouth Once A Day 2)  Carvedilol 12.5 Mg Tabs (Carvedilol) .... Take 1/2  Tablet By Mouth Twice A Day 3)  Amlodipine Besylate 2.5 Mg  Tabs (Amlodipine Besylate) .... One By Mouth Qam 4)  Aspirin 81 Mg Tbec (Aspirin) .... Take One Tablet By Mouth Daily 5)  Omeprazole 20 Mg Cpdr (Omeprazole) .... As Needed, Once A Day 6)  B Complex-B12  Tabs (B Complex Vitamins) .... Take 1 Tablet By Mouth Once A Day 7)  Multivitamins  Tabs (Multiple Vitamin) .... Take 1 Tablet By Mouth Once A Day 8)  Isosorbide Mononitrate Cr 30 Mg Xr24h-Tab (Isosorbide Mononitrate) .... Take One Tablet By Mouth Daily 9)  Plavix 75 Mg Tabs (Clopidogrel Bisulfate) .... Take One Tablet By Mouth Daily 10)  Amiodarone Hcl 200 Mg Tabs (Amiodarone Hcl) .... Take 2  Tablet By Mouth Daily  Allergies: 1)  ! Lipitor (Atorvastatin Calcium)  Vital Signs:  Patient profile:   77 year old male Height:      69 inches Weight:      197.50 pounds BMI:     29.27 Pulse rate:   63 / minute Pulse rhythm:   regular Resp:     18 per minute BP sitting:   100 / 58  (left arm) Cuff size:   large  Vitals Entered By: Vikki Ports (June 16, 2010 11:04 AM)  Physical Exam  General:  Well developed,  well nourished, in no acute distress. Head:  normocephalic and atraumatic Eyes:  PERRLA/EOM intact; conjunctiva and lids normal. Neck:  soft left caroitd bruit.  Lungs:  clear to auscultaton and percussion. Heart:  SEM  2/6.  No diastolic murmur. Abdomen:  Bowel sounds positive; abdomen soft and non-tender without masses, organomegaly, or hernias noted. No hepatosplenomegaly. Pulses:  pulses normal in all 4 extremities Extremities:  No clubbing or cyanosis. Neurologic:  Alert and oriented x 3.   Cardiac Cath  Procedure date:  06/07/2010  Findings:      ANGIOGRAPHIC DATA:   1. The left main was free of critical disease.   2. The LAD demonstrates about a 90% stenosis after the septal and the       diagonal.  The diagonal has some diffuse luminal irregularity.       After this, there was competitive filling from multiple sources   3. The saphenous vein graft to the diagonal is intact and fills       retrograde, and there appears to be about a 90% ostial diagonal       stenosis.  This also fills down the LAD as does the native       injection.   4. The internal mammary all  the way down to the LAD and beyond this       demonstrates excellent flow.  It is difficult to see the LAD from       the IMA injection because it was nonselectively injected, but it       was relatively well seen on the vein graft injection into the       diagonal, and also through the native artery.  Flow to the apex       appeared to be preserved.   5. The circumflex provides a small marginal and then a subtotally    occluded.  There is then a second small obtuse marginal  that is       subtotally occluded and then the vessel coursed posteriorly to       where it is attached to by the graft.  There is competitive filling       distally.   6. The saphenous vein graft to the distal OM is intact.  There is a       fair amount of diffuse luminal irregularity throughout the vessel       itself, but no critical  stenoses.   7. The right coronary artery is a relatively small vessel with about       70% proximal narrowing.  The PDA fills in through the injection       through the native vessel.   8. The saphenous vein graft to the PDA is intact with a valve in the       midportion.      CONCLUSIONS:   1. Patent internal mammary to the left anterior descending artery.   2. Patent saphenous vein graft to the diagonal.   3. Patent saphenous vein graft to the obtuse marginal.   4. Patent saphenous vein graft to posterior descending artery.   5. Subtotal small marginal branch.      Echocardiogram  Procedure date:  06/05/2010  Findings:      Study Conclusions   Left ventricle: Difficult acoustic windows. Question lateral   hypokinesis? The cavity size was normal. Wall thickness was   increased in a pattern of mild LVH. Systolic function was vigorous.   The estimated ejection fraction was in the range of 65% to 70%.   Features are consistent with a pseudonormal left ventricular filling   pattern, with concomitant abnormal relaxation and increased filling   pressure (grade 2 diastolic dysfunction).  EKG  Procedure date:  06/16/2010  Findings:      atrial pacing, ventricular tracking with RBBB.  PPM Specifications Following MD:  [name], MD     Referring MD:  [facility] Vendor:  Medtronic     PPM Model Number:  ADDROI     PPM Serial Number:  [id number] PPM DOI:  12/23/2006      Lead 1    Location: RA     DOI: 12/23/2006     Model #: [id number]     Serial #: [id number]     Status: active Lead 2    Location: RV     DOI: 12/23/2006     Model #: [id number]     Serial #: [id number]     Status: active  Magnet Response Rate:  BOL 85 ERI 65  Indications:  TACHY/BRADY   PPM Follow Up Pacer Dependent:  No      Episodes Coumadin:  No  Parameters Mode:  DDDR     Lower Rate Limit:  60  Upper Rate Limit:  130 Paced AV Delay:  350     Sensed AV Delay:  350  Impression &  Recommendations:  Problem # 1:  CAD, ARTERY BYPASS GRAFT (ICD-414.04)  Had non STEMI and recath.  Not sure of culprit but could have been small marginal.  Had recurrence.  Have advised to be less active. To avoid such things as mowing grass currently. Will recheck status in four weeks in follow up.   His updated medication list for this problem includes:    Lisinopril 20 Mg Tabs (Lisinopril) .Marland Kitchen... Take one-half tablet by mouth once a day    Carvedilol 12.5 Mg Tabs (Carvedilol) .Marland Kitchen... Take 1/2  tablet by mouth twice a day    Amlodipine Besylate 2.5 Mg Tabs (Amlodipine besylate) ..... One by mouth qam    Aspirin 81 Mg Tbec (Aspirin) .Marland Kitchen... Take one tablet by mouth daily    Isosorbide Mononitrate Cr 30 Mg Xr24h-tab (Isosorbide mononitrate) .Marland Kitchen... Take one tablet by mouth daily    Plavix 75 Mg Tabs (Clopidogrel bisulfate) .Marland Kitchen... Take one tablet by mouth daily  Orders: EKG w/ Interpretation (93000) TLB-BMP (Basic Metabolic Panel-BMET) (80048-METABOL) TLB-CBC Platelet - w/Differential (85025-CBCD) TLB-Hepatic/Liver Function Pnl (80076-HEPATIC)  Problem # 2:  SUPRAVENTRICULAR TACHYCARDIA (ICD-427.89) Combo of flutter and fib.  Now on Amiodarone.  Will reduce dose to 200mg  per day in four weeks, and continue to monitor rhythm.  Will maintain current medications His updated medication list for this problem includes:    Lisinopril 20 Mg Tabs (Lisinopril) .Marland Kitchen... Take one-half tablet by mouth once a day    Carvedilol 12.5 Mg Tabs (Carvedilol) .Marland Kitchen... Take 1/2  tablet by mouth twice a day    Amlodipine Besylate 2.5 Mg Tabs (Amlodipine besylate) ..... One by mouth qam    Aspirin 81 Mg Tbec (Aspirin) .Marland Kitchen... Take one tablet by mouth daily    Isosorbide Mononitrate Cr 30 Mg Xr24h-tab (Isosorbide mononitrate) .Marland Kitchen... Take one tablet by mouth daily    Plavix 75 Mg Tabs (Clopidogrel bisulfate) .Marland Kitchen... Take one tablet by mouth daily    Amiodarone Hcl 200 Mg Tabs (Amiodarone hcl) .Marland Kitchen... Take 2  tablet by mouth  daily  Problem # 3:  HYPERTENSION (ICD-401.9) BP is borderline at present.  Will cut Lisinopril to one half tablet per day in light of new medications.  His updated medication list for this problem includes:    Lisinopril 20 Mg Tabs (Lisinopril) .Marland Kitchen... Take one-half tablet by mouth once a day    Carvedilol 12.5 Mg Tabs (Carvedilol) .Marland Kitchen... Take 1/2  tablet by mouth twice a day    Amlodipine Besylate 2.5 Mg Tabs (Amlodipine besylate) ..... One by mouth qam    Aspirin 81 Mg Tbec (Aspirin) .Marland Kitchen... Take one tablet by mouth daily  Problem # 4:  HYPERLIPIDEMIA (ICD-272.4) rediscuss at next ov.   Patient Instructions: 1)  Your physician recommends that you schedule a follow-up appointment in: 1 MONTH 2)  Your physician recommends that you have lab work today: CBC, BMP, LIVER 3)  Your physician has recommended you make the following change in your medication: DECREASE Lisinopril to 10mg  once a day

## 2011-01-04 NOTE — Letter (Signed)
Summary: Device-Delinquent Phone Journalist, newspaper, Main Office  [address]   [place], [place] [address]   Phone: [phone]  Fax: [phone]     September 07, 2010 MRN: [id number]   [facility] [address] [place], [place]  [address]   Dear [name],  According to our records, you were scheduled for a device phone transmission on  08-31-10.     We did not receive any results from this check.  If you transmitted on your scheduled day, please call us to help troubleshoot your system.  If you forgot to send your transmission, please send one upon receipt of this letter.  Thank you,   Architectural technologist [facility]

## 2011-01-04 NOTE — Assessment & Plan Note (Signed)
Summary: f/u on hypotension   Visit Type:  Follow-up  CC:  Pt. states blood pressure is better today.  History of Present Illness: Feeling much better.  Meds held and this is early follow up.  Denies chest pain, or presyncope.  Current Medications (verified): 1)  Carvedilol 12.5 Mg Tabs (Carvedilol) .... Take 1/2  Tablet By Mouth Twice A Day 2)  Amlodipine Besylate 2.5 Mg  Tabs (Amlodipine Besylate) .... One By Mouth Qam 3)  Aspirin 81 Mg Tbec (Aspirin) .... Take One Tablet By Mouth Daily 4)  Omeprazole 40 Mg Cpdr (Omeprazole) .... As Needed 5)  B Complex-B12  Tabs (B Complex Vitamins) .... Take 1 Tablet By Mouth Once A Day 6)  Multivitamins  Tabs (Multiple Vitamin) .... Take 1 Tablet By Mouth Once A Day 7)  Isosorbide Mononitrate Cr 30 Mg Xr24h-Tab (Isosorbide Mononitrate) .... Take One Tablet By Mouth Daily 8)  Plavix 75 Mg Tabs (Clopidogrel Bisulfate) .... Take One Tablet By Mouth Daily 9)  Amiodarone Hcl 200 Mg Tabs (Amiodarone Hcl) .... Take One Tablet By Mouth Daily  Allergies: 1)  ! Lipitor (Atorvastatin Calcium)  Vital Signs:  Patient profile:   77 year old male Height:      69 inches Weight:      191.75 pounds BMI:     28.42 Pulse rate:   65 / minute Pulse rhythm:   regular Resp:     18 per minute BP sitting:   116 / 68  (left arm) Cuff size:   large  Vitals Entered By: Vikki Ports (July 24, 2010 12:56 PM)  Physical Exam  General:  Well developed, well nourished, in no acute distress. Head:  normocephalic and atraumatic Eyes:  PERRLA/EOM intact; conjunctiva and lids normal. Lungs:  Clear bilaterally to auscultation and percussion. Heart:  PMI non displaced.  NormalS1.  SEM, soft apical murmur. Extremities:  No clubbing or cyanosis.   EKG  Procedure date:  07/24/2010  Findings:      Atrial pacing.  Ventricular tracking.  T inversion in 1, AVL.  PPM Specifications Following MD:  [name], MD     Referring MD:  [facility] PPM Vendor:  [employer]      PPM Model Number:  ADDROI     PPM Serial Number:  [id number] PPM DOI:  12/23/2006      Lead 1    Location: RA     DOI: 12/23/2006     Model #: [id number]     Serial #: [id number]     Status: active Lead 2    Location: RV     DOI: 12/23/2006     Model #: [id number]     Serial #: [id number]     Status: active  Magnet Response Rate:  BOL 85 ERI 65  Indications:  TACHY/BRADY   PPM Follow Up Pacer Dependent:  No      Episodes Coumadin:  No  Parameters Mode:  DDDR     Lower Rate Limit:  60     Upper Rate Limit:  130 Paced AV Delay:  350     Sensed AV Delay:  350  Impression & Recommendations:  Problem # 1:  HYPOTENSION (ICD-458.9) resolved at present with medication adkustments.  Continue current regimen in the absence of symptoms.  Problem # 2:  CAD, ARTERY BYPASS GRAFT (ICD-414.04) Continues without angina, or recurrent ischemia at present.  His updated medication list for this problem includes:    Carvedilol 12.5 Mg Tabs (Carvedilol) .Marland Kitchen... Take 1/2  tablet by mouth twice a day    Amlodipine Besylate 2.5 Mg Tabs (Amlodipine besylate) ..... One by mouth qam    Aspirin 81 Mg Tbec (Aspirin) .Marland Kitchen... Take one tablet by mouth daily    Isosorbide Mononitrate Cr 30 Mg Xr24h-tab (Isosorbide mononitrate) .Marland Kitchen... Take one tablet by mouth daily    Plavix 75 Mg Tabs (Clopidogrel bisulfate) .Marland Kitchen... Take one tablet by mouth daily  Problem # 3:  HYPERLIPIDEMIA (ICD-272.4) currently on no statins.  Will follow.  Patient Instructions: 1)  Your physician recommends that you schedule a follow-up appointment in: 2 MONTHS.  2)  Your physician recommends that you continue on your current medications as directed. Please refer to the Current Medication list given to you today.

## 2011-01-14 ENCOUNTER — Encounter (INDEPENDENT_AMBULATORY_CARE_PROVIDER_SITE_OTHER): Payer: Self-pay | Admitting: *Deleted

## 2011-01-23 ENCOUNTER — Telehealth: Payer: Self-pay | Admitting: Cardiovascular Disease

## 2011-01-24 NOTE — Letter (Signed)
Summary: Remote Device Check  [employer], Main Office  [address]   [place], [place] [address]   Phone: [phone]  Fax: [phone]     January 14, 2011 MRN: [id number]   [facility] [address] [place], [place]  [address]   Dear [name],   Your remote transmission was recieved and reviewed by your physician.  All diagnostics were within normal limits for you.  __X____Your next office visit is scheduled for:  April 2012 with Dr Graciela Husbands. Please call our office to schedule an appointment.    Sincerely,  Vella Kohler

## 2011-01-24 NOTE — Cardiovascular Report (Signed)
Summary: Office Visit Remote   Office Visit Remote   Imported By: Roderic Ovens 01/16/2011 15:15:57  _____________________________________________________________________  External Attachment:    Type:   Image     Comment:   External Document

## 2011-01-24 NOTE — Miscellaneous (Signed)
Summary: [facility] Progress [facility]   [facility]   Imported By: Roderic Ovens 01/17/2011 14:17:33  _____________________________________________________________________  External Attachment:    Type:   Image     Comment:   External Document

## 2011-01-30 NOTE — Progress Notes (Signed)
Summary: Amio dose  Phone Note Call from Patient Call back at Home Phone [phone]   Caller: Patient Reason for Call: Talk to Nurse Summary of Call: PT WANTS TO SPEAK TO THE NURSE CONCERNING HIS MEDICATIONS  Initial call taken by: Edman Circle,  January 23, 2011 11:19 AM  Follow-up for Phone Call        Pt is aware Amiodarone is 200mg  daily which is what he has been taken since it was decreased 07/2010.  His pharmacy still had it two times a day  Follow-up by: Lisabeth Devoid RN,  January 23, 2011 11:53 AM

## 2011-02-12 ENCOUNTER — Encounter: Payer: Self-pay | Admitting: Cardiology

## 2011-02-18 LAB — BASIC METABOLIC PANEL
BUN: 13 mg/dL (ref 6–23)
BUN: 17 mg/dL (ref 6–23)
BUN: 20 mg/dL (ref 6–23)
BUN: 20 mg/dL (ref 6–23)
BUN: 25 mg/dL — ABNORMAL HIGH (ref 6–23)
BUN: 26 mg/dL — ABNORMAL HIGH (ref 6–23)
CO2: 23 mEq/L (ref 19–32)
CO2: 24 mEq/L (ref 19–32)
CO2: 26 mEq/L (ref 19–32)
CO2: 26 mEq/L (ref 19–32)
Calcium: 7.9 mg/dL — ABNORMAL LOW (ref 8.4–10.5)
Calcium: 8.1 mg/dL — ABNORMAL LOW (ref 8.4–10.5)
Calcium: 8.4 mg/dL (ref 8.4–10.5)
Chloride: 104 mEq/L (ref 96–112)
Chloride: 106 mEq/L (ref 96–112)
Chloride: 107 mEq/L (ref 96–112)
Chloride: 107 mEq/L (ref 96–112)
Chloride: 108 mEq/L (ref 96–112)
Chloride: 109 mEq/L (ref 96–112)
Chloride: 109 mEq/L (ref 96–112)
Creatinine, Ser: 1.07 mg/dL (ref 0.4–1.5)
Creatinine, Ser: 1.14 mg/dL (ref 0.4–1.5)
Creatinine, Ser: 1.28 mg/dL (ref 0.4–1.5)
GFR calc Af Amer: 52 mL/min — ABNORMAL LOW (ref >60)
GFR calc Af Amer: 52 mL/min — ABNORMAL LOW (ref >60)
GFR calc Af Amer: >60 mL/min (ref >60)
GFR calc non Af Amer: 45 mL/min — ABNORMAL LOW (ref >60)
GFR calc non Af Amer: >60 mL/min (ref >60)
GFR calc non Af Amer: >60 mL/min (ref >60)
Glucose, Bld: 107 mg/dL — ABNORMAL HIGH (ref 70–99)
Glucose, Bld: 117 mg/dL — ABNORMAL HIGH (ref 70–99)
Glucose, Bld: 119 mg/dL — ABNORMAL HIGH (ref 70–99)
Glucose, Bld: 136 mg/dL — ABNORMAL HIGH (ref 70–99)
Potassium: 3.2 mEq/L — ABNORMAL LOW (ref 3.5–5.1)
Potassium: 3.6 mEq/L (ref 3.5–5.1)
Potassium: 4.1 mEq/L (ref 3.5–5.1)
Potassium: 4.3 mEq/L (ref 3.5–5.1)
Sodium: 138 mEq/L (ref 135–145)
Sodium: 139 mEq/L (ref 135–145)
Sodium: 140 mEq/L (ref 135–145)
Sodium: 140 mEq/L (ref 135–145)

## 2011-02-18 LAB — CARDIAC PANEL(CRET KIN+CKTOT+MB+TROPI)
CK, MB: 22.3 ng/mL (ref 0.3–4.0)
CK, MB: 26.7 ng/mL (ref 0.3–4.0)
Relative Index: 10.2 — ABNORMAL HIGH (ref 0.0–2.5)
Relative Index: 12.8 — ABNORMAL HIGH (ref 0.0–2.5)
Relative Index: 7.9 — ABNORMAL HIGH (ref 0.0–2.5)
Relative Index: 9 — ABNORMAL HIGH (ref 0.0–2.5)
Total CK: 189 U/L (ref 7–232)
Total CK: 327 U/L — ABNORMAL HIGH (ref 7–232)
Total CK: 336 U/L — ABNORMAL HIGH (ref 7–232)
Total CK: 342 U/L — ABNORMAL HIGH (ref 7–232)
Total CK: 371 U/L — ABNORMAL HIGH (ref 7–232)
Total CK: 483 U/L — ABNORMAL HIGH (ref 7–232)
Troponin I: 3.42 ng/mL (ref 0.00–0.06)
Troponin I: 5.27 ng/mL (ref 0.00–0.06)
Troponin I: 5.27 ng/mL (ref 0.00–0.06)
Troponin I: 5.66 ng/mL (ref 0.00–0.06)
Troponin I: 6.17 ng/mL (ref 0.00–0.06)
Troponin I: 7.57 ng/mL (ref 0.00–0.06)

## 2011-02-18 LAB — BRAIN NATRIURETIC PEPTIDE: Pro B Natriuretic peptide (BNP): 938 pg/mL — ABNORMAL HIGH (ref 0.0–100.0)

## 2011-02-18 LAB — CBC
HCT: 27.9 % — ABNORMAL LOW (ref 39.0–52.0)
HCT: 28.8 % — ABNORMAL LOW (ref 39.0–52.0)
HCT: 31.3 % — ABNORMAL LOW (ref 39.0–52.0)
HCT: 31.9 % — ABNORMAL LOW (ref 39.0–52.0)
HCT: 37.9 % — ABNORMAL LOW (ref 39.0–52.0)
Hemoglobin: 10.1 g/dL — ABNORMAL LOW (ref 13.0–17.0)
Hemoglobin: 11 g/dL — ABNORMAL LOW (ref 13.0–17.0)
Hemoglobin: 13.3 g/dL (ref 13.0–17.0)
Hemoglobin: 9.6 g/dL — ABNORMAL LOW (ref 13.0–17.0)
Hemoglobin: 9.9 g/dL — ABNORMAL LOW (ref 13.0–17.0)
MCH: 31.2 pg (ref 26.0–34.0)
MCH: 31.3 pg (ref 26.0–34.0)
MCH: 31.3 pg (ref 26.0–34.0)
MCH: 31.4 pg (ref 26.0–34.0)
MCH: 31.5 pg (ref 26.0–34.0)
MCHC: 34.3 g/dL (ref 30.0–36.0)
MCHC: 34.5 g/dL (ref 30.0–36.0)
MCHC: 34.6 g/dL (ref 30.0–36.0)
MCHC: 35 g/dL (ref 30.0–36.0)
MCV: 89.1 fL (ref 78.0–100.0)
MCV: 91.2 fL (ref 78.0–100.0)
MCV: 91.3 fL (ref 78.0–100.0)
MCV: 91.6 fL (ref 78.0–100.0)
MCV: 92 fL (ref 78.0–100.0)
MCV: 92.1 fL (ref 78.0–100.0)
Platelets: 157 10*3/uL (ref 150–400)
Platelets: 165 10*3/uL (ref 150–400)
Platelets: 206 10*3/uL (ref 150–400)
RBC: 3.24 MIL/uL — ABNORMAL LOW (ref 4.22–5.81)
RDW: 13.6 % (ref 11.5–15.5)
RDW: 14 % (ref 11.5–15.5)
RDW: 14.1 % (ref 11.5–15.5)
RDW: 14.1 % (ref 11.5–15.5)
RDW: 14.1 % (ref 11.5–15.5)
RDW: 14.1 % (ref 11.5–15.5)
RDW: 14.1 % (ref 11.5–15.5)
WBC: 10.1 10*3/uL (ref 4.0–10.5)
WBC: 10.4 10*3/uL (ref 4.0–10.5)
WBC: 10.7 10*3/uL — ABNORMAL HIGH (ref 4.0–10.5)
WBC: 9.2 10*3/uL (ref 4.0–10.5)

## 2011-02-18 LAB — DIFFERENTIAL
Basophils Relative: 0 % (ref 0–1)
Eosinophils Relative: 5 % (ref 0–5)
Monocytes Absolute: 0.7 10*3/uL (ref 0.1–1.0)
Monocytes Relative: 7 % (ref 3–12)
Neutro Abs: 8.4 10*3/uL — ABNORMAL HIGH (ref 1.7–7.7)

## 2011-02-18 LAB — HEPARIN LEVEL (UNFRACTIONATED)
Heparin Unfractionated: 0.2 IU/mL — ABNORMAL LOW (ref 0.30–0.70)
Heparin Unfractionated: 0.27 IU/mL — ABNORMAL LOW (ref 0.30–0.70)
Heparin Unfractionated: 0.3 IU/mL (ref 0.30–0.70)
Heparin Unfractionated: 0.42 IU/mL (ref 0.30–0.70)
Heparin Unfractionated: 0.55 IU/mL (ref 0.30–0.70)

## 2011-02-18 LAB — LIPID PANEL
HDL: 30 mg/dL — ABNORMAL LOW (ref >39)
LDL Cholesterol: 112 mg/dL — ABNORMAL HIGH (ref 0–99)
Triglycerides: 82 mg/dL (ref <150)
VLDL: 16 mg/dL (ref 0–40)

## 2011-02-18 LAB — MRSA PCR SCREENING: MRSA by PCR: NEGATIVE

## 2011-02-18 LAB — HEMOGLOBIN A1C: Hgb A1c MFr Bld: 5.8 % — ABNORMAL HIGH (ref <5.7)

## 2011-02-27 ENCOUNTER — Ambulatory Visit: Payer: Self-pay | Admitting: Cardiology

## 2011-03-10 LAB — BASIC METABOLIC PANEL
CO2: 29 mEq/L (ref 19–32)
Calcium: 8.4 mg/dL (ref 8.4–10.5)
Creatinine, Ser: 1.18 mg/dL (ref 0.4–1.5)
GFR calc Af Amer: >60 mL/min (ref >60)
GFR calc non Af Amer: 59 mL/min — ABNORMAL LOW (ref >60)
Sodium: 140 mEq/L (ref 135–145)

## 2011-03-10 LAB — COMPREHENSIVE METABOLIC PANEL
AST: 27 U/L (ref 0–37)
Albumin: 4 g/dL (ref 3.5–5.2)
BUN: 16 mg/dL (ref 6–23)
CO2: 25 mEq/L (ref 19–32)
Calcium: 8.8 mg/dL (ref 8.4–10.5)
Chloride: 110 mEq/L (ref 96–112)
Creatinine, Ser: 1.44 mg/dL (ref 0.4–1.5)
GFR calc Af Amer: 56 mL/min — ABNORMAL LOW (ref >60)
GFR calc non Af Amer: 47 mL/min — ABNORMAL LOW (ref >60)
Total Bilirubin: 1 mg/dL (ref 0.3–1.2)

## 2011-03-10 LAB — CK TOTAL AND CKMB (NOT AT ARMC)
CK, MB: 4 ng/mL (ref 0.3–4.0)
Relative Index: 2.1 (ref 0.0–2.5)
Total CK: 188 U/L (ref 7–232)

## 2011-03-10 LAB — LIPID PANEL
HDL: 24 mg/dL — ABNORMAL LOW (ref >39)
Total CHOL/HDL Ratio: 7.5 RATIO
Triglycerides: 131 mg/dL (ref <150)
VLDL: 26 mg/dL (ref 0–40)

## 2011-03-10 LAB — CBC
HCT: 37.5 % — ABNORMAL LOW (ref 39.0–52.0)
MCHC: 34.3 g/dL (ref 30.0–36.0)
MCHC: 35.1 g/dL (ref 30.0–36.0)
MCV: 90.2 fL (ref 78.0–100.0)
MCV: 90.3 fL (ref 78.0–100.0)
Platelets: 232 10*3/uL (ref 150–400)
RBC: 3.74 MIL/uL — ABNORMAL LOW (ref 4.22–5.81)
RDW: 13.9 % (ref 11.5–15.5)
WBC: 9.4 10*3/uL (ref 4.0–10.5)

## 2011-03-10 LAB — DIFFERENTIAL
Basophils Absolute: 0 10*3/uL (ref 0.0–0.1)
Lymphocytes Relative: 12 % (ref 12–46)
Lymphs Abs: 1.2 10*3/uL (ref 0.7–4.0)
Neutro Abs: 7.3 10*3/uL (ref 1.7–7.7)

## 2011-03-10 LAB — URINALYSIS, ROUTINE W REFLEX MICROSCOPIC
Bilirubin Urine: NEGATIVE
Glucose, UA: NEGATIVE mg/dL
Hgb urine dipstick: NEGATIVE
Ketones, ur: 15 mg/dL — AB
Protein, ur: NEGATIVE mg/dL
Urobilinogen, UA: 0.2 mg/dL (ref 0.0–1.0)

## 2011-03-10 LAB — URINE MICROSCOPIC-ADD ON

## 2011-03-10 LAB — CARDIAC PANEL(CRET KIN+CKTOT+MB+TROPI)
Total CK: 180 U/L (ref 7–232)
Troponin I: 0.02 ng/mL (ref 0.00–0.06)
Troponin I: 0.02 ng/mL (ref 0.00–0.06)

## 2011-03-10 LAB — URINE CULTURE: Colony Count: 2000

## 2011-03-10 LAB — PROTIME-INR: Prothrombin Time: 12.6 seconds (ref 11.6–15.2)

## 2011-03-10 LAB — MAGNESIUM: Magnesium: 2.4 mg/dL (ref 1.5–2.5)

## 2011-03-13 ENCOUNTER — Ambulatory Visit (INDEPENDENT_AMBULATORY_CARE_PROVIDER_SITE_OTHER): Payer: Medicare Other | Admitting: Cardiology

## 2011-03-13 ENCOUNTER — Encounter: Payer: Self-pay | Admitting: Cardiology

## 2011-03-13 DIAGNOSIS — E785 Hyperlipidemia, unspecified: Secondary | ICD-10-CM

## 2011-03-13 DIAGNOSIS — I10 Essential (primary) hypertension: Secondary | ICD-10-CM

## 2011-03-13 DIAGNOSIS — I4891 Unspecified atrial fibrillation: Secondary | ICD-10-CM

## 2011-03-13 DIAGNOSIS — I25810 Atherosclerosis of coronary artery bypass graft(s) without angina pectoris: Secondary | ICD-10-CM

## 2011-03-13 DIAGNOSIS — I2510 Atherosclerotic heart disease of native coronary artery without angina pectoris: Secondary | ICD-10-CM

## 2011-03-13 DIAGNOSIS — E7800 Pure hypercholesterolemia, unspecified: Secondary | ICD-10-CM

## 2011-03-13 NOTE — Patient Instructions (Signed)
Your physician wants you to follow-up in: 6 MONTHS.  You will receive a reminder letter in the mail two months in advance. If you don't receive a letter, please call our office to schedule the follow-up appointment.   Your physician recommends that you continue on your current medications as directed. Please refer to the Current Medication list given to you today.  Your physician recommends that you return for lab work in: 1 MONTH (TSH, LIVER, BMP, CBC --414.01, 272.0, 427.31) lab hours 8:30-1:45 and 2:30-4:30

## 2011-03-30 NOTE — Assessment & Plan Note (Addendum)
Had non Q wave MI, and see cath report in the [place] part of his medical record.  He is on DAPT, and really never uses omeprazole so this is not an issue at present.

## 2011-03-30 NOTE — Assessment & Plan Note (Signed)
Not on a statin at present.

## 2011-03-30 NOTE — Assessment & Plan Note (Signed)
On Pacerone.  Appears to be staying in NSR.  Will get labs and reassess.

## 2011-03-30 NOTE — Assessment & Plan Note (Signed)
Well controlled at present

## 2011-03-30 NOTE — Progress Notes (Signed)
HPI:  He is doing just fine.  No major complaints.  Feels good. No major symptoms at the present time.  Tolerates pacerone.  Meds reviewed, and on omep and plavix.   Current Outpatient Prescriptions  Medication Sig Dispense Refill  . amiodarone (PACERONE) 200 MG tablet Take 200 mg by mouth daily.        Marland Kitchen amLODipine (NORVASC) 2.5 MG tablet Take 2.5 mg by mouth 2 (two) times daily.        Marland Kitchen aspirin 81 MG tablet Take 81 mg by mouth daily.        Marland Kitchen b complex vitamins tablet Take 1 tablet by mouth daily.        . carvedilol (COREG) 12.5 MG tablet Take 12.5 mg by mouth 2 (two) times daily with a meal.        . clopidogrel (PLAVIX) 75 MG tablet Take 75 mg by mouth daily.        . Multiple Vitamin (MULTIVITAMIN) capsule Take 1 capsule by mouth daily.        Marland Kitchen omeprazole (PRILOSEC) 20 MG capsule Take 20 mg by mouth daily.          Allergies  Allergen Reactions  . Atorvastatin     REACTION: leg pain    Past Medical History  Diagnosis Date  . A-fib     paroxysmal  . Tachy-brady syndrome   . CAD (coronary artery disease)     MI angioplasty 43, s/p CABG 01/2006- pacemaker  (12-2006)  . CHF (congestive heart failure)   . Hyperlipidemia   . Hypertension   . Spinal stenosis   . GERD (gastroesophageal reflux disease)   . RLS (restless legs syndrome)   . Vitamin B12 deficiency   . Insomnia   . History of BPH     TURP '96 at [facility] 96, urethoplast 97 ( History of bladder outlet obstruction, chronic incontinence)    Past Surgical History  Procedure Date  . Coronary artery bypass graft   . Pacemaker insertion     12-2006 PPM Medtronic  . Turp vaporization   . Inguinal hernia repair   . Replacement total knee     Left  . Carpal tunnel release     Family History  Problem Relation Age of Onset  . Heart disease Neg Hx   . Diabetes Neg Hx     History   Social History  . Marital Status: Married    Spouse Name: N/A    Number of Children: N/A  . Years of Education: N/A   Occupational  History  . Not on file.   Social History Main Topics  . Smoking status: Former Games developer  . Smokeless tobacco: Not on file  . Alcohol Use: No  . Drug Use:   . Sexually Active:    Other Topics Concern  . Not on file   Social History Narrative  . No narrative on file    ROS: Please see the HPI.  All other systems reviewed and negative.  PHYSICAL EXAM:  BP 132/80  Pulse 70  Resp 18  Ht 5\' 8"  (1.727 m)  Wt 196 lb 1.9 oz (88.959 kg)  BMI 29.82 kg/m2  General: Well developed, well nourished, in no acute distress. Head:  Normocephalic and atraumatic. Neck: no JVD Lungs: Clear to auscultation and percussion. Heart: Normal S1 and S2.  No murmur, rubs or gallops.  Abdomen:  Normal bowel sounds; soft; non tender; no organomegaly Pulses: Pulses normal in all 4 extremities. Extremities:  No clubbing or cyanosis. No edema. Neurologic: Alert and oriented x 3.  EKG:  AV pacing.    ASSESSMENT AND PLAN:

## 2011-04-09 ENCOUNTER — Other Ambulatory Visit: Payer: Self-pay | Admitting: Cardiovascular Disease

## 2011-04-09 ENCOUNTER — Other Ambulatory Visit (INDEPENDENT_AMBULATORY_CARE_PROVIDER_SITE_OTHER): Payer: Medicare Other | Admitting: *Deleted

## 2011-04-09 DIAGNOSIS — E7800 Pure hypercholesterolemia, unspecified: Secondary | ICD-10-CM

## 2011-04-09 DIAGNOSIS — I2510 Atherosclerotic heart disease of native coronary artery without angina pectoris: Secondary | ICD-10-CM

## 2011-04-09 DIAGNOSIS — I4891 Unspecified atrial fibrillation: Secondary | ICD-10-CM

## 2011-04-09 LAB — TSH: TSH: 3.59 u[IU]/mL (ref 0.35–5.50)

## 2011-04-09 LAB — HEPATIC FUNCTION PANEL
AST: 25 U/L (ref 0–37)
Albumin: 3.7 g/dL (ref 3.5–5.2)
Alkaline Phosphatase: 56 U/L (ref 39–117)
Bilirubin, Direct: 0.2 mg/dL (ref 0.0–0.3)

## 2011-04-09 LAB — CBC WITH DIFFERENTIAL/PLATELET
Basophils Absolute: 0 10*3/uL (ref 0.0–0.1)
Basophils Relative: 0.4 % (ref 0.0–3.0)
Eosinophils Absolute: 0.3 10*3/uL (ref 0.0–0.7)
Lymphocytes Relative: 14.9 % (ref 12.0–46.0)
MCHC: 34.3 g/dL (ref 30.0–36.0)
MCV: 92.3 fl (ref 78.0–100.0)
Monocytes Absolute: 0.5 10*3/uL (ref 0.1–1.0)
Neutrophils Relative %: 73.2 % (ref 43.0–77.0)
Platelets: 207 10*3/uL (ref 150.0–400.0)
RDW: 14.4 % (ref 11.5–14.6)

## 2011-04-09 LAB — BASIC METABOLIC PANEL
BUN: 18 mg/dL (ref 6–23)
CO2: 27 mEq/L (ref 19–32)
Calcium: 8.5 mg/dL (ref 8.4–10.5)
Chloride: 107 mEq/L (ref 96–112)
Creatinine, Ser: 1.4 mg/dL (ref 0.4–1.5)

## 2011-04-17 NOTE — Assessment & Plan Note (Signed)
[facility]                         ELECTROPHYSIOLOGY OFFICE NOTE   NAME:Macaraeg, MYRL LAZARUS                     MRN:          [id number]  DATE:04/29/2007                            DOB:          Apr 03, 1921    Mr. Selner comes in.  He is status post pacemaker implantation and  amiodarone therapy for tachy-brady syndrome.  This occurs in the setting  of ischemic heart disease.  He feels like his energy level is somewhat  worse since the pacemaker was implanted.  He has had no recurring rapid  heart rates since the amiodarone was initiated a couple of weeks ago.  At that time, his TSH and his LFT's were normal.   CURRENT MEDICATIONS:  1. Amiodarone 200 b.i.d.  2. Coreg 18.75.  3. Lisinopril 20 b.i.d.  4. Prilosec.  5. Coumadin.   PHYSICAL EXAMINATION:  GENERAL:  On examination today, his blood  pressure was elevated at 160/92, and that was rechecked.  His pulse was  79.  LUNGS:  Clear.  HEART:  Heart sounds were regular.  EXTREMITIES:  Without edema.   Interrogation of his Medtronic Adapta pulse generator demonstrates that  he is nearly 100% atrial paced, and his AV delay was increased to allow  for intrinsic conduction.  However, there is also a hump in his heart  rate excursion in the 100-110 bin, suggesting that there may be a little  bit more vigorous heart rate response than is appropriate, so I  decreased his activity threshold from medium/low to medium/high.  Hopefully, this will accommodate that.   He is scheduled to see Dr. Riley Kill in about 4 weeks time.  At that time,  he will need LFT's and a TSH repeated.   I would also add Dr. Riley Kill to decrease his amiodarone at that time  from 400 to 300 mg a day.   I will plan to see him again in about 4 months time, at which time, if  everything is going okay, we will plan to drop him down to 200 mg a day.     Duke Salvia, MD, [facility]  Electronically Signed    SCK/MedQ  DD:  04/29/2007  DT: 04/29/2007  Job #: [id number]

## 2011-04-17 NOTE — Assessment & Plan Note (Signed)
[facility]                            CARDIOLOGY OFFICE NOTE   NAME:[name], [name]                     MRN:          [id number]  DATE:10/23/2007                            DOB:          02/21/1921    Mr. Brigham is in for followup.  In general he has been stable, his  overall energy is decreased. He had his pacer adjusted by Dr. Clide Cliff back  in May. He is still on 200 mg of amiodarone a day; he stopped his  Coumadin and really does not want to take that.  In general, he is able  to do most things however.  His blood pressures at home had been reasonably stable but were up  somewhat today.   CURRENT MEDICATIONS:  Include Prilosec 20 mg daily, lisinopril 20 mg  b.i.d., Coreg 12.5 1-1/2 b.i.d., aspirin 81 mg daily, amiodarone 200 mg  daily.   PHYSICAL EXAMINATION:  His blood pressure is 144/90, the pulse is 63.  The lung fields are clear and the cardiac rhythm is regular.  Overall he  has no lower extremity edema.   IMPRESSION:  1. Status post coronary artery bypass graft surgery.  2. History of hypercholesterolemia.  3. Markedly advanced age.  4. Paroxysmal atrial fibrillation.  5. Desire not to take Coumadin.   PLAN:  1. Continue current medical regimen.  2. Liver functions, basic metabolic profile and TSH today in the      office.  3. Continue amiodarone.  4. Return to clinic in 3 months.     Arturo Morton. Riley Kill, MD, [facility]  Electronically Signed    TDS/MedQ  DD: 10/23/2007  DT: 10/23/2007  Job #: [id number]
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
## 2011-04-17 NOTE — Assessment & Plan Note (Signed)
[facility]                            CARDIOLOGY OFFICE NOTE   NAME:[name], [name]                     MRN:          [id number]  DATE:04/09/2007                            DOB:          [birth year]    [name] is in for a followup visit.  To basically summarize, he has  had bursts of SVT.  A CardioNet monitor was put on and he has had  recurrent episodes, in fact one today on the way in which then  eventually resolved.  We have documented this.  On the CardioNet, it  appears that he has both atrial fibrillation as well as an organized  long RP SVT.  He has not had syncope or presyncope.  His pacemaker was  interrogated today in the office and there is no evidence of pacer  malfunction.  He denies significant chest pain.   EXAMINATION:  Today the blood pressure is 110/70 and the pulse is 68.  The lung fields are clear and the cardiac rhythm is regular.   Review of the EKG reveals a wide complex tachycardia of right bundle  morphology.  It is similar to his underlying normal rhythm.  The rate is  140 beats per minute.  After conversion the patient had atrioventricular  pacing.   Recent echocardiogram revealed normal left ventricular systolic function  with mild aortic valve calcification.   IMPRESSION:  1. Known coronary artery disease status post coronary artery bypass      graft surgery.  2. Recurrent supraventricular tachycardia not well controlled on Coreg      18.75 mg b.i.d.  3. Chronic Coumadin anticoagulation.   PLAN:  1. Basic metabolic panel.  2. TSH.  3. Add Lanoxin 0.25 mg times two, then 0.125 mg daily.  Prescription      given and instructions given.  4. Return to clinic in 4 weeks.  5. Return to Dr. Graciela Husbands tomorrow.     [name]. Riley Kill, MD, [facility]  Electronically Signed    TDS/MedQ  DD: 04/09/2007  DT: 04/09/2007  Job #: [id number]

## 2011-04-17 NOTE — Assessment & Plan Note (Signed)
[facility]                         ELECTROPHYSIOLOGY OFFICE NOTE   NAME:Consuegra, NORTH ESTERLINE                     MRN:          [id number]  DATE:10/23/2007                            DOB:          10-24-1921    Mr. Grieser is seen in followup for atrial fibrillation.  It is  paroxysmal with bradycardia, status post pacemaker implantation  device/generator replacement about a year ago.  His Coumadin was stopped  this summer.  On interrogation of his device, he has had two episodes of  prolonged atrial fibrillation lasting 2-3 hours.   He is having problems apparently with significant bleeding.  He is  currently on aspirin 81 b.i.d., amiodarone 200, Coreg 6.25 b.i.d.,  lisinopril, and Prilosec.   PHYSICAL EXAMINATION:  His blood pressure today was 144/90, pulse 63.  LUNGS:  Clear.  CARDIAC:  Heart sounds were regular.  EXTREMITIES:  Without edema.   Interrogation of his Medtronic pacemaker demonstrates a P wave of 1 with  an impedance of 474, threshold of 0.75 and 0.4.  The R wave was 4 with  an impedance of 42 with a threshold of 0.5 at 0.4.  The atrial  fibrillation episodes were as noted previously.   IMPRESSION:  1. Tachy-brady syndrome.  2. Paroxysmal atrial fibrillation.  3. Status post pacemaker for the above.   Mr. Mcneese is having recurrent episodes of paroxysmal atrial  fibrillation.  We will need to keep track of this via his Care Link  monitor to decide whether he will get to the point where the risks of  Coumadin are outweighed by its potential benefit.     Duke Salvia, MD, [facility]  Electronically Signed    SCK/MedQ  DD: 10/23/2007  DT: 10/23/2007  Job #: [id number]

## 2011-04-17 NOTE — Assessment & Plan Note (Signed)
[facility]                            CARDIOLOGY OFFICE NOTE   NAME:[name], [name]                     MRN:          [id number]  DATE:11/03/2008                            DOB:          [birth year]    Mr. Teall is in for followup.  In general, he has been really pretty  stable.  He has a little bit of mild chest tightness but not frequent.  It is neither better nor worse.  He has been relatively stable.   MEDICATIONS:  1. Prilosec 20 mg daily.  2. Lisinopril 20 mg b.i.d.  3. Vitamin B.  4. Amlodipine 2.5 daily.  5. Coreg 12.5 mg 1-1/2 tablets b.i.d.  6. Aspirin 81 mg b.i.d.   PHYSICAL EXAMINATION:  GENERAL:  He is alert and oriented in no  distress.  VITAL SIGNS:  Blood pressure 154/82, the pulse is 66 and regular.  LUNG:  The lung fields are clear.  CARDIAC:  Rhythm reveals an S4 gallop.   Electrocardiogram demonstrates atrial pacing with ventricular tracking  with an underlying right bundle.   Overall, this gentleman has remained stable since his surgery.  He  significantly atrially paced.  He is no longer on amiodarone.  He has  had rare episodes of atrial fibrillation on his monitoring.  He is on a  fairly aggressive medical regimen, and given he is approaching 77 years  of age, he is relatively stable.  Our leaning would be in direction of  continued medical therapy at the present time.  I do not think he is a  good Coumadin candidate.     Arturo Morton. Riley Kill, MD, [facility]  Electronically Signed    TDS/MedQ  DD: 11/03/2008  DT: 11/04/2008  Job #: [id number]
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
## 2011-04-17 NOTE — Letter (Signed)
Apr 10, 2007    [name]. Riley Kill, MD, [facility]  [address]  [address]  [place], [place] [address]   RE:  [name], [name]  MRN:  [id number]  /  DOB:  08/19/1921   Dear [name]:   It was a pleasure to see [name] at your request today.   As you know, he is status post pacemaker implantation for tachybrady  syndrome.  He had paroxysmal atrial fibrillation identified by Loop  recorder implantation which was really quite fast with rates over the  150 range.  He has subsequently been having recurrent problems with  tachy palpitations abrupt in onset and offset regular and awaken him at  night.  A CardioNet monitor was utilized and actually a 12-lead  yesterday demonstrated a short RP tachycardia.   His exercise tolerance remains quite impaired.  His ejection fraction at  last assessment in 2007 was normal.   He had bypass surgery in February 2007.  He was started on Amiodarone at  that time.  He tolerated it without untoward effects as best as he and  his wife could recall.   MEDICATIONS:  His other medications currently include Lisinopril 20,  aspirin, Prilosec, Coumadin, Coreg 18.75 and digoxin started yesterday.   REVIEW OF SYSTEMS:  He has no known drug allergies.   REVIEW OF SYSTEMS:  Noncontributory.   PHYSICAL EXAMINATION:  VITAL SIGNS:  Blood pressure 152/96, pulse 84.  HEENT:  No icterus nor xanthoma.  NECK:  Neck veins were flat.  Carotids were brisk and full bilaterally  without bruits.  BACK:  Without kyphosis scoliosis.  LUNGS:  Clear.  HEART:  Regular without murmurs or gallops.  EXTREMITIES:  Without edema.   LABORATORY DATA:  Blood work that you obtained yesterday demonstrated  THS of 1.83, creatinine of 1.1.  We will try to see if we can get LFT's  added.   Electrocardiogram from yesterday demonstrated, as described, a short RP  tachycardia.  There is a discernable retrograde P wave in leads II and  III at approximately 100 milliseconds after the onset of  QRS.  This is  most consistent with an accessory pathway.  A loop recorded striped from  December 08, 2006, demonstrates atrial fibrillation with a rapid  ventricular response.   IMPRESSION:  1. Supraventricular tachycardia including:      a.     Atrial fibrillation with rapid ventricular response.      b.     Short RP tachycardia, probably mediated via concealed       accessory pathway.  2. Ischemic heart disease status post CABG with perioperative atrial      fibrillation, previously on amiodarone.  3. Exercise intolerance.  4. First degree AV block, potentially contributing to #3.  5. Bradycardia status post pacemaker implantation.   [name], [name] has multiple atrial supraventricular arrhythmias.  I  cannot tell from the Loop recorder whether the atrial fibrillation is  secondary to the AV reentrant to tachycardia, although, at his age, it  is unlikely to be so.  Hence, I have suggested that we could take a  couple of options:  1. Use amiodarone which would be effective at both rate control as      well as both arrhythmias.  2. We could undertake catheter ablation of the current dominant      arrhythmia which is the AV reentrant tachycardia, and see what      happens with the atrial fibrillation.  3. Consider augmented  AV nodal blocking medications and for AV      junction ablation.   He and his wife both favor the first.  We talked about the potential  benefits as well as the potential side effects of Amiodarone.  We will  plan to:  1. I checked his LFT's.  You checked his TSH yesterday, and it was      normal.  2. Obtain pulmonary function tests with the DLCO.  3. We will discontinue his digoxin.  4. We will plan to closely monitor his amiodarone for drug      interactions.  5. I will plan to see him again in about 3-4 weeks to see how he is      doing tolerating his medications.    Sincerely,      Duke Salvia, MD, [facility]  Electronically Signed    SCK/MedQ   DD: 04/10/2007  DT: 04/10/2007  Job #: [id number]

## 2011-04-17 NOTE — Letter (Signed)
June 24, 2008    Barbette Hair. Artist Pais, DO  [address]  [place], [place] [address]   RE:  [name], [name]  MRN:  [id number]  /  DOB:  [birth year]   Dear Nadine Counts,   I had the pleasure of seeing [name] in the office today in  followup.  He and his wife had recently got the new blood pressure  machine, and they had been checking these regularly.  Fortunately, they  have been relatively stable.  All of the blood pressures have been  really quite acceptable.  There have been 1 or 2, which have been  moderately elevated, but not enough to prompt treatment.   His current medications include Prilosec 20 mg daily, lisinopril 20 mg  b.i.d., amlodipine 2.5 mg daily, Coreg 12.5 mg 1-1/2 tablets b.i.d. and  aspirin 81 mg daily.   On physical examination today, the blood pressure was 150/90 and the  pulse was 61.  I could not appreciate any abdominal bruits.  He does  have a small abdominal umbilical hernia, and he questioned this today.  The extremities reveal no edema.   Recent BUN and creatinine done in November 2008 were normal at 18 and  1.3.  Based up on these findings, and relatively good blood pressures  that were noted, I do not think given his age that further evaluation  would be warranted at least at the present time.  He has not had any  evidence of renal dysfunction, and his blood pressures are under  reasonable control.  Fortunately, he has not had recurrence of chest pain.  We will see him  back in followup in about 4 months, but I would be happy to see him  sooner, should further problems arise.   Thanks for allowing me to share in his care.    Sincerely,      [name]. Riley Kill, MD, [facility]  Electronically Signed    TDS/MedQ  DD: 06/24/2008  DT: 06/25/2008  Job #: [id number]

## 2011-04-17 NOTE — Assessment & Plan Note (Signed)
[facility]                            CARDIOLOGY OFFICE NOTE   NAME:Langland, FERDINAND REVOIR                     MRN:          [id number]  DATE:06/10/2008                            DOB:          [birth year]    Mr. Quinter is in at the request of Dr. Artist Pais.  He recently had his blood  pressures were elevated.  He has not been having significant chest pain.  Blood pressure was as high as 160/100 in the left arm this morning.  He  did not have chest pain or shortness of breath.  He went to see Dr. Drue Novel  and was encouraged to come over here.  There has also been question  about whether he snores and whether he needs to have a sleep apnea  evaluation.  The patient is 77 years old.   His medications include Prilosec 20 mg daily, lisinopril 20 mg b.i.d.,  amlodipine 2.5 daily, aspirin 81 mg daily, and Coreg 12.5 one and half  tablets b.i.d.   With the blood pressures elevated, we could increase his amlodipine.  However, he is going to get a blood pressure cuff, and we are going to  have him check these on a regular basis.  We will see him back in  followup in 2 weeks.  At that time, we will reassess his entire status.  His EKG today reveals only electronic atrial pacemaker with right bundle-  branch block.     Arturo Morton. Riley Kill, MD, [facility]  Electronically Signed    TDS/MedQ  DD: 06/24/2008  DT: 06/25/2008  Job #: [id number]
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
## 2011-04-20 NOTE — Assessment & Plan Note (Signed)
[facility]                         ELECTROPHYSIOLOGY OFFICE NOTE   NAME:Griebel, CHRISTOPHR CALIX                     MRN:          [id number]  DATE:12/16/2006                            DOB:          1921-01-17    Mr. Volkman is seen. He had syncope prior to presentation February 2007.  He underwent bypass surgery and a loop recorder was put in as he had  normal left ventricular function. He was noninducible with EP testing.  He had postoperative atrial fibrillation and was put on amiodarone which  has subsequently been discontinued.   He activated his loop recorder last weekend because of rapid  tachypalpitations which turned out to be atrial fibrillation.   He also describes episodes where he gets briefly fuzzy headed, this is  not clearly associated with atrial fibrillation.   CURRENT MEDICATIONS:  Aspirin and lisinopril.   PHYSICAL EXAMINATION:  VITAL SIGNS:  His blood pressure today was  144/84, his pulse was 50.  LUNGS:  Clear.  HEART:  Sounds were regular with a 2/6 systolic ejection murmur.  EXTREMITIES:  Without edema.   Electrocardiogram  dated today demonstrated sinus rhythm at 51 with  interval at 0.21/0.13/0.48.   There is right bundle branch block.   Loop recorder was as noted previously with autodetection at 150 and this  prompted recording and as noted demonstrated rapid atrial fibrillation.   IMPRESSION:  1. Paroxysmal atrial fibrillation.  2. Thromboembolic risk factors notable for:      a.     Hypertension.      b.     Age.  3. Coronary artery disease status post coronary artery bypass graft.  4. Syncope with noninducible EP test status post loop recorder      implantation.  5. Hypertension with significant elevation at the time of his episode      of atrial fibrillation.  6. Bradycardia/right bundle branch block.   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 I have reviewed this with Dr.  Riley Kill. Options would include p.r.n.  AV nodal blocking agents versus  backup brady pacing and daily therapy. Dr. Riley Kill favors the latter and  certainly given the significance of his bradycardia it is hard to argue  with that.   With that in mind, we will plan to:  1. Undertake pacemaker implantation. I have reviewed with the patient      and his wife the potential benefits as well as the potential risks      including but not limited to death, perforation, infection,      pneumothorax, and lead dislodgment.  2. Will postoperatively initiate Coumadin. I have reviewed with them      again the thromboembolic risks and the potential benefits of      therapy as well as the potential risks including intracerebral      hemorrhage.   We will anticipate augmented rate control following pacemaker  implantation using beta blocker initially using atenolol as he has  tolerated that in the past.     Duke Salvia, MD, [facility]  Electronically Signed    SCK/MedQ  DD: 12/16/2006  DT: 12/16/2006  Job #: [id number]   cc:   Willow Ora, MD

## 2011-04-20 NOTE — Assessment & Plan Note (Signed)
[facility]                                 ON-CALL NOTE   NAME:[name] [name] SUMLIN                     MRN:          [id number]  DATE:01/18/2007                            DOB:          [birth year]    Telephone calls received all on January 18, 2007, the 1st one at 9:14  this morning, the 2nd one at 12:06.  I also spoke with them another  time, which I do not have the time recorded, and I spoke with the triage  nurse here in the emergency room 3 times concerning the patient also.  Apparently, [name], when I returned his call this morning at [phone]-  [phone], I initially spoke with his wife.  He states he recently was  started on Coumadin, and is followed in the Coumadin Clinic.  His first  complaint was that a student is managing his Coumadin levels, and that  he did not think they know what they are doing.  Apparently, he is  followed in the Coumadin Clinic, and there are pharmacy students there,  but they are supervised by Dr. Shelby [name].  I asked him what patient's  complaint was; he could not give me a definite complaint regarding the  Coumadin Clinic.  I asked him if he knew why he was on the Coumadin, and  he said for an irregular heart rhythm.  I asked him what his INR was; he  could not tell me that, but he was able to tell me the doses on his  Coumadin.  He is calling because apparently he had some bright red  rectal bleeding yesterday.  He states he packed his rectum with toilet  pap so it would stop, and it finally did.  This morning woke up and  there was blood on his pillowcase that had soaked through to his pillow  from his nose.  He was once again experiencing a little bit of bright  red bleeding from his rectum.  I asked him if he was lightheaded or  dizzy.  He stated no.  No chest discomfort.  No presyncope or syncopal  episodes.  I instructed him to come to the emergency room, as he needed  to be evaluated, as he could not tell me  his last INR, and it is not in  E-Chart system.  He stated he did not really wish to do that, and his  wife also stated that maybe they could just take 1/2 of the Coumadin and  come in our office Monday.  I stated that was not acceptable because I  did not know where his bleeding was coming from.  She stated they would  think about it and come to the emergency room.  I told them once they  got to the emergency room they would be evaluated, and then if we were  needed we would be called.  They apparently arrived in the emergency  room and insisted that the triage nurse call me.  Once again, I relayed  the information, he could go ahead and be worked up, and we  would see  him if needed.  The patient apparently was not pleased with this.  He  stated that they were not going to wait with all of the other people in  the ER, as he did not want to be exposed to any germs.  He then,  apparently, left the emergency room and went back home.  His wife called  me when they returned home and told me that she wanted me to come to the  emergency room and put him in a separate room away from all of those  other people, and evaluate him and just check his INR.  I instructed her  once again that I could not do that, that we would be glad to see him  once he got here, but that the emergency room could go ahead and  evaluate him and check some lab work, and would call us after that, and  we could manage him if needed.  She stated they would think about this.  Once again, she felt that patient could just wait until Monday.  I told  her I did not feel that was safe considering his age and the fact that  he has not been on the Coumadin that long.  Eventually, they were able  to find a piece of paper that stated his last INR was 2.9, but  apparently his Coumadin dose had been adjusted over this visit last  week.  She stated they would consider coming back.  Within an hour I  received another call from the triage  nurse stating they were here, and  that his wife was requesting once again that they be put in a separate  room.  They were not going to wait in the lobby with all those other  people and get sick.  All they wanted to do was just get his Coumadin  level checked and told what it was so they could go home.  I explained  to the triage nurse once again that he had experienced some rectal  bleeding and epistaxis, and that he needed to be evaluated, and this  could be done by the ER staff, and I would be glad to come and see the  patient.  I discussed this with Dr. Ladona Ridgel, made him aware of the  situation.  He felt this was something that could be addressed by the ER  physician and Internal Medicine if needed for questionable GI bleeding,  that this was not a cardiac issue at the moment.  I reiterated this with  the triage nurse when I spoke with her later.  At that time the patient  and his wife were not sure if they were going to stay.      Dorian Pod, ACNP  Electronically Signed      Doylene Canning. Ladona Ridgel, MD  Electronically Signed   MB/MedQ  DD: 01/18/2007  DT: 01/18/2007  Job #: [id number]

## 2011-04-20 NOTE — Op Note (Signed)
NAMEKRYSTOPHER, [name] NO.:  [id number]   MEDICAL RECORD NO.:  [id number]          PATIENT TYPE:  OIB   LOCATION:  2807                         FACILITY:  [facility]   PHYSICIAN:  Duke Salvia, MD, FACCDATE OF BIRTH:  07/11/1921   DATE OF PROCEDURE:  12/23/2006  DATE OF DISCHARGE:                               OPERATIVE REPORT   PREOPERATIVE DIAGNOSIS:  Paroxysmal atrial fibrillation with  bradycardia, previous syncope.   POSTOPERATIVE DIAGNOSIS:  Paroxysmal atrial fibrillation with  bradycardia, previous syncope.   PROCEDURES:  Explantation of a previously implanted loop recorder,  implantation of pacemaker.   Following obtaining informed consent, the patient was brought to the  electrophysiology laboratory and placed on the fluoroscopic table in  supine position.  After routine prep and drape of the left upper chest,  lidocaine was infiltrated in prepectoral subclavicular region.  An  incision was made and carried down to the layer of the pre pectoral  fascia using electrocautery and sharp dissection.  Unfortunately the  pectoral fascia was violated at this point.  This resulted in some  bleeding which had to be cauterized.   In any case the pocket was then extended down to the layer of the loop  recorder pocket.  The loop recorder pocket was opened.  The loop  recorder anchoring sutures were removed.  The device was explanted.  The  anterior and posterior aspect of the loop recorder pocket was apposed  with a 4-0 Vicryl suture.   At this point attention was turned to gaining access to the  extrathoracic left subclavian vein which was accomplished with modest  difficulty.  There was no aspiration of air or puncture of the artery.  I subsequently did a double wire approach.   Sequentially 7-French tearaway introducer sheaths were placed through  which were passed sequentially Medtronic 5076 58 cm active fixation  ventricular lead, serial number [id number]  and a Medtronic 5076 52 cm  fixation atrial lead similar [id number].  Under fluoroscopic guidance,  these leads were manipulated to the right ventricular septum and the  right atrial appendage remnant respectively where the bipolar R wave was  11.8 with a pace impedance of 782 ohms, a threshold 0.7 volts at 0.5  milliseconds.  Current at threshold was 0.5 MA and there is no  diaphragmatic pacing at 10 volts, the current of injury was brisk.   The bipolar P-wave was 2.6 mV with a pace impedance of 734 ohms, a  threshold 1.1 volts at 0.4 milliseconds.  Current at threshold was 1.8  MA and again the current of injury was brisk.  The ventricular lead was  marked with a tie.   These leads were secured to the prepectoral fascia and then attached to  a Medtronic Adaptic ADDR01 pulse generator, serial number [id number].  Ventricular pacing and AV pacing were identified.  Surgicel was used  overlying the violated pectoralis muscle, hemostasis having been  obtained.  The leads and pulse generator were placed in the pocket  secured to the prepectoral fascia.  The wound was then closed in three  layers in normal  fashion.  The wound was washed, dried and a benzoin Steri-Strip dressing was  applied.  Needle counts, sponge counts and instrument counts were  correct at the end of the procedure according to staff.  The patient  tolerated the procedure without apparent complication.      Duke Salvia, MD, [facility]  Electronically Signed     SCK/MEDQ  D:  12/23/2006  T:  12/23/2006  Job:  [id number]   cc:   Willow Ora, MD  Arturo Morton. Riley Kill, MD, [facility]

## 2011-04-20 NOTE — Assessment & Plan Note (Signed)
[facility]                              CARDIOLOGY OFFICE NOTE   NAME:Steichen, AKSEL BENCOMO                     MRN:          [id number]  DATE:06/25/2006                            DOB:          06/27/1921    Mr. Whalley is in for followup.  He has overall been doing well.  He has not  had recurrent syncope or presyncope.  He is scheduled to see Dr. Graciela Husbands in  followup in about a month.   PHYSICAL EXAMINATION:  Today on exam the blood pressure is 130/76, pulse is  55.  The lung fields are clear, and the cardiac rhythm is regular.  There is  a soft systolic ejection murmur with no diastolic murmurs appreciated.   Overall the patient continues to do well.  He has not had any recurrent  episodes.  He is scheduled to see Dr. Graciela Husbands again in a month.  We will see  him back in followup in three to four months in the Cardiology Clinic with  left bundle branch block.                              Arturo Morton. Riley Kill, MD, [facility]    TDS/MedQ  DD:  06/25/2006  DT:  06/25/2006  Job #:  [id number]
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
## 2011-04-20 NOTE — Discharge Summary (Signed)
NAMETHEODORE, VIRGIN NO.:  [id number]   MEDICAL RECORD NO.:  [id number]          PATIENT TYPE:  INP   LOCATION:  4703                         FACILITY:  [facility]   PHYSICIAN:  Rene Paci, M.D. LHCDATE OF BIRTH:  18-Sep-1921   DATE OF ADMISSION:  10/22/2005  DATE OF DISCHARGE:  10/23/2005                                 DISCHARGE SUMMARY   DISCHARGE DIAGNOSES:  1.  Status post near syncopal event.  2.  Questionable subacute infarct anterior limb of left internal capsule on      CT.  3.  History of coronary artery disease.   HISTORY OF PRESENT ILLNESS:  The patient is an 77 year old male who was  admitted after he had an episode of losing his balance and falling back.  This episode did not include any loss of consciousness, chest pain, or  shortness of breath.  The patient was admitted for further evaluation.   PAST MEDICAL HISTORY:  1.  Coronary artery disease status post MI in 1993.  2.  History of CHF.  3.  High cholesterol.  4.  Hypertension.  5.  B12 deficiency.  6.  BPH.  7.  History of spinal stenosis.  8.  Status post Cardiolite February 2006.  9.  Status post TURP, redone at [facility] with bladder incontinence.  10. Status post left knee replacement.   HOSPITAL COURSE:  #1.  STATUS POST NEAR SYNCOPAL EVENT:  The patient was admitted, and he  underwent CT of the head which showed atrophy and chronic ischemic changes.  It also noted a chronic versus subacute infarct on the anterior limb of the  internal capsule on the left.  The patient's neurological exam at time of  discharge shows no focal deficits.  The patient has had no further  dizziness.  In addition, the patient underwent orthostatic blood pressure  checks which did not show any significant orthostatic hypotension.   #2.  HISTORY OF CORONARY ARTERY DISEASE:  A cardiology consult was obtained  secondary to patient's cardiac history, and cardiac enzymes were negative.  The patient was  evaluated by Dr. Riley Kill from [facility] who  recommended outpatient Cardiolite.  This will e arranged by the cardiology  team prior to discharge.   #3.  HYPERTENSION:  The patient was maintained on ACE inhibitor.  Blood  pressure as labile during this hospitalization and will need outpatient  followup.   LABORATORY DATA AT DISCHARGE:  Hemoglobin 13.5, hematocrit 39.5. BUN 10,  creatinine 1.1.   DISCHARGE MEDICATIONS:  1.  Lisinopril 20 mg p.o. daily.  2.  Aspirin 325 mg p.o. once daily.  3.  Temazepam 15 mg at bedtime as needed.   FOLLOW UP:  The patient is to follow up with Dr. Willow Ora on Monday,  November 27, at 12:45 p.m.  In addition, the patient will be set up for an  outpatient Cardiolite per cardiology team.      Katrinka Blazing. Peggyann Juba, NP      Rene Paci, M.D. [facility]  Electronically Signed    MSO/MEDQ  D:  10/23/2005  T:  10/23/2005  Job:  [id number]   cc:   Arturo Morton. Riley Kill, M.D. [facility]  [address]  [address]  [place]  [place] [address]

## 2011-04-20 NOTE — H&P (Signed)
[name], [name] NO.:  [id number]   MEDICAL RECORD NO.:  [id number]          PATIENT TYPE:  INP   LOCATION:  NA                           [facility]ACILITY[facility]  [facility]   PHYSICIAN:  Ollen Gross, M.D.    DATE OF BIRTH:  30-Oct-1921   DATE OF ADMISSION:  01/22/2005  DATE OF DISCHARGE:                                HISTORY & PHYSICAL   DATE OF OFFICE VISIT/HISTORY AND PHYSICAL:  January 16, 2005.   CHIEF COMPLAINT:  Left knee pain.   HISTORY OF PRESENT ILLNESS:  The patient is an 77 year old male who has been  seen by Dr. Lequita Halt  for ongoing bilateral knee pain.  He has previously  undergone a right total knee arthroplasty and has done quite well.  He is  over six months out from his previous knee surgery.  He is known also to  have end-stage arthritis in the left knee which has continued to cause him  pain.  He has done extremely well with the right knee and felt he would be  an excellent candidate for the left knee surgery.  The risks and benefits  have been discussed with the patient.  He has elected to proceed with  surgery.  He has been seen by Dr. Lutricia Horsfall preoperatively and felt stable  for upcoming management.  It is suggested if need any assistance with  medical management, to contact Dr. Rene Paci with [facility]  during the hospital course.   ALLERGIES:  No known drug allergies.   CURRENT MEDICATIONS:  1.  Lisinopril 20 mg daily.  2.  Vicodin p.r.n.   PAST MEDICAL HISTORY:  1.  Coronary arterial disease with a previous past history of congestive      heart failure.  2.  Hypertension.  3.  Past history of myocardial infarction.  4.  Heart murmur.  5.  Hemorrhoids.  6.  Urinary incontinence.  7.  Past history of viral spinal meningitis.  8.  Benign prostatic hypertrophy.  9.  History urinary tract infection.  10. Restless leg syndrome.  11. He has had a past history of postoperative blood loss anemia requiring       transfusion.   PAST SURGICAL HISTORY:  1.  Cardiac catheterization with balloon angioplasty.  2.  Hernia repair.  3.  Transurethral resection of the prostate.  4.  A redo transurethral resection of the prostate.  5.  Reconstructive urethra surgery.  6.  Bilateral hand surgery.  7.  As previously mentioned, right total knee arthroplasty.   SOCIAL HISTORY:  Married, retired, nonsmoker, no alcohol.  Has two children.  Wife will be assisting with care after surgery.  Lives in a two story home  with 11 steps going down into the basement.   FAMILY HISTORY:  Father with a history of heart disease and colon cancer.  Mother with a history of stroke and hypertension.   REVIEW OF SYSTEMS:  GENERAL:  No fevers, chills, night sweats.  NEUROLOGICAL:  No seizures, syncope or paralysis.  RESPIRATORY:  No  shortness of breath, productive cough or hemoptysis.  CARDIOVASCULAR:  No  chest pain, angina, or orthopnea.  GASTROINTESTINAL:  He does have a history  of some incontinence with frequency and weak stream and occasional nocturia.  No dysuria or hematuria.  MUSCULOSKELETAL:  Left knee found in history of  present illness.   PHYSICAL EXAMINATION:  VITAL SIGNS:  Pulse 56, respirations 12, blood  pressure 166/92.  GENERAL: An 77 year old white male, well-nourished, well-developed in no  acute distress.  He is alert and oriented and cooperative.  Very pleasant at  the time of exam.  HEENT:  Normocephalic, atraumatic.  Pupils are round and reactive.  He is  noted to wear glasses.  EOM's are intact.  NECK:  Supple.  CHEST:  Clear anteriorly and posteriorly.  Chest reveals no rhonchi, rales  or wheezing.  HEART:  He does have a bradycardic rhythm with a pulse of 56.  A grade 2 to  3/6 systolic ejection murmur best heard at the aortic point and also heard  at pulmonic and ERB's point.  ABDOMEN:  Soft, nontender.  Bowel sounds present.  RECTAL/BREASTS/GENITALIA:  Not done, not pertinent to present  illness.  EXTREMITIES:  Left knee shows a varus malalignment deformity, marked  crepitus is noted on passive range of motion.  Range of motion shows 5-115  degrees in the left knee.  He is very tender medially on exam.   IMPRESSION:  1.  Osteoarthritis left knee.  2.  Coronary arterial disease.  3.  History of congestive heart failure.  4.  Hypertension.  5.  History of myocardial infarction.  6.  Heart murmur.  7.  Past history of viral spinal meningitis.  8.  Hemorrhoids.  9.  Urinary incontinence.  10. Benign prostatic hypertrophy.  11. Past history of urinary tract infection.  12. Restless leg syndrome.  13. Past history of postoperative blood loss anemia requiring transfusion.   PLAN:  The patient is admitted to [facility] to undergo a left  total knee arthroplasty.  Surgery will be performed by Dr. Ollen Gross.  Dr. Rene Paci is the hospitalist for [facility].  She will be consulted  a the request of Dr. Lutricia Horsfall if needed during the hospital course for  medical management.      ALP/MEDQ  D:  01/21/2005  T:  01/21/2005  Job:  [id number]   cc:   Angelena Sole, M.D. [facility]   Maisie Fus D. Riley Kill, M.D. [facility]

## 2011-04-20 NOTE — Consult Note (Signed)
NAMEEILEEN, CROSWELL NO.:  [id number]   MEDICAL RECORD NO.:  [id number]          PATIENT TYPE:  INP   LOCATION:  4703                         FACILITY:  [facility]   PHYSICIAN:  Rollene Rotunda, M.D.   DATE OF BIRTH:  Apr 04, 1921   DATE OF CONSULTATION:  10/22/2005  DATE OF DISCHARGE:                                   CONSULTATION   REFERRING PHYSICIAN:  Dr. Willow Ora.   PRIMARY CARE PHYSICIAN:  Dr. Willow Ora.   REASON FOR PRESENTATION:  Evaluate patient with dizziness.   HISTORY OF PRESENT ILLNESS:  The patient is a pleasant 77 year old gentleman  with a past cardiac history as described below.  He usually does very well.  He is active.  He does a little bit of aerobic activity every day and works  out with [employer]; he did that yesterday.  He denies any chest  discomfort, neck discomfort, arm discomfort, activity-induced nausea,  vomiting or excessive diaphoresis.  He never has any palpitations and has  never noted any presyncope or syncope.  He has never had problems with his  breathing such as PND or orthopnea.   Last night he woke up to go to the bathroom, which is his norm.  He walked  into his bathroom and felt lightheaded.  He did not describe the room  spinning or amaurosis fugax.  He did not have any apparent focal motor,  speech or visual problems.  He felt like he might pass out and sat on the  edge of his bathtub.  He then slid backwards into his bathtub.  He never  lost consciousness and had never had trauma.  He was a little weak getting  out of the bathtub, but was able to get up with the help of his wife and  make it into go to the bathroom and back to the bed.  He woke up later in  the evening and was able to go to the bathroom, although he took a cane with  him.  He was a little nauseated.  Today, he said he did have a little chest  fullness, though this was mild.  He does not think this was reminiscent of  the pain he had with his previous  MI.  He gradually got better throughout  the course of the day, but went to see Dr. Drue Novel today and was placed in the  hospital.  He has never had such an event before.  He says he is now back to  his baseline.  There was no apparent seizure activity.  The patient has  otherwise been doing well, as stated above and noted in the review of  systems.   PAST MEDICAL HISTORY:  1.  Hypertension.  2.  Hyperlipidemia.  3.  Aseptic meningitis.  4.  Benign prostatic hypertrophy.  5.  Arthritis.  6.  B12 deficiency.  7.  Coronary artery disease, status post acute anterior myocardial      infarction in 1993, treated with TPA and PCI of his LAD (last      catheterization in '93, 90% LAD, 80%  right coronary artery, 40% OM-1).      (Last stress perfusion study, February 2006, with EF of 64% and no      ischemia.)   PAST SURGICAL HISTORY:  1.  Status post TURP.  2.  Right inguinal hernia repair.  3.  Right carpal tunnel surgery.  4.  Right and left knee surgeries.   ALLERGIES:  STATIN INTOLERANCE.   MEDICATIONS:  1.  Lisinopril 20 mg twice daily.  2.  Aspirin 81 mg daily.   SOCIAL HISTORY:  The patient lives in [place] with his wife.  He is  retired as a Doctor, hospital.  He quit smoking in 1960.   FAMILY HISTORY:  His family history is noncontributory for early coronary  artery disease, though his mother had heart problems at a later age.   REVIEW OF SYSTEMS:  As stated in the HPI, positive for arthralgias, reflux,  negative for other systems.   PHYSICAL EXAMINATION:  GENERAL:  The patient is robust-appearing and in no  distress.  He has a pleasant affect.  VITAL SIGNS:  Blood pressure 176/100, heart rate 71 and regular, afebrile.  HEENT:  Eye unremarkable, pupils are equal, round and reactive to light and  fundi not visualized.  Oral mucosa unremarkable.  NECK:  No jugular venous distention; wave form within normal limits; carotid  upstroke brisk and symmetric; no bruits, no  thyromegaly.  LYMPHATICS:  No cervical, axillary or inguinal adenopathy.  LUNGS:  Clear to auscultation and percussion bilaterally.  BACK:  No costovertebral angle tenderness.  CHEST:  Unremarkable.  HEART:  PMI not displaced or sustained, S1 and S2 within normal limits, no  S3, no S4, no murmurs.  ABDOMEN:  Flat; positive bowel sounds, normal in frequency and pitch; no  bruits, no rebound, no guarding, no midline pulsatile mass, no hepatomegaly,  no splenomegaly.  SKIN:  No rashes, no nodules.  EXTREMITIES:  Pulses 2+ throughout; no edema, no cyanosis, no clubbing.  NEUROLOGIC:  Oriented to person, place and time; cranial nerves II-XII  grossly intact; motor grossly intact.   ACCESSORY CLINICAL DATA:  EKG:  Sinus bradycardia, rate 4, axis within  normal limits, intervals within normal limits, early transition in lead V2,  no acute ST-T wave changes.   LABORATORY DATA:  WBC 8.8, hemoglobin 15.5; sodium 137, potassium 3.8, BUN  10, creatinine 1.1.   IMAGING STUDIES:  Head CT:  Atrophy with chronic small vessel ischemic  changes and acute versus chronic small infarct into left internal capsule,  no hemorrhage.   ASSESSMENT AND PLAN:  1.  Dizziness:  The patient's dizziness is unlikely to have a primary      cardiac etiology, though this is not entirely clear.  There is a      question of whether he was hypotensive and had orthostasis, or a vagal      episode.  There is not strong evidence for a primary dysrhythmia, though      he has a resting sinus bradycardia.  He has not had any recent symptoms      consistent with ischemia, other than some mild chest tightness today      following this event.  He has the CT findings noted above and this will      be per Dr. Drue Novel.   At this point I would continue the patient on telemetry and rule out a  myocardial infarction.  I would continue his medicines as listed.  We will check formal orthostatic blood pressures.  If he is not felt to have  a  neurologic cause (again, refer to the CT above), I would suggest a  Cardiolite if his enzymes are negative; if positive, he will need cardiac  catheterization.   1.  Dyslipidemia:  Unfortunately, he has not been able to tolerate a statin.      I will defer treatment with Niaspan plus-or-minus Zetia with folic acid      to Drs. Paz and Brooten.   1.  Hypertension:  Blood pressure is elevated today, but is creeping down.      His medications will be adjusted, based on subsequent readings.           ______________________________  Rollene Rotunda, M.D.     JH/MEDQ  D:  10/22/2005  T:  10/23/2005  Job:  [id number]   cc:   Wanda Plump, MD [facility]  [phone] [place], [place] [address]   Arturo Morton. Riley Kill, M.D. [facility]  [address]  [address]  [place]  [place] [address]

## 2011-04-20 NOTE — Discharge Summary (Signed)
[name], [name] NO.:  [id number]   MEDICAL RECORD NO.:  [id number]          PATIENT TYPE:  INP   LOCATION:  0472                         FACILITY:  [facility]   PHYSICIAN:  [name], M.D.    DATE OF BIRTH:  1921-07-02   DATE OF ADMISSION:  01/22/2005  DATE OF DISCHARGE:  01/26/2005                                 DISCHARGE SUMMARY   ADMITTING DIAGNOSES:  1.  Osteoarthritis of the left knee.  2.  Coronary artery disease.  3.  History of congestive heart failure.  4.  Hypertension.  5.  History of myocardial infarction.  6.  Heart murmur.  7.  Past history of viral spinal meningitis.  8.  Hemorrhoids.  9.  Urinary incontinence.  10. Benign prostatic hypertrophy.  11. Past history of urinary tract infection.  12. Restless leg syndrome.  13. Past history of blood loss and anemia requiring transfusion.   DISCHARGE DIAGNOSES:  1.  Osteoarthritis of the left knee status post left total knee      arthroplasty, computer navigation-assisted.  2.  Postoperative acute renal insufficiency, improved.  3.  Postoperative blood loss anemia, did not require transfusion during      hospitalization.  4.  Coronary artery disease.  5.  History of congestive heart failure.  6.  Hypertension.  7.  History of myocardial infarction.  8.  Heart murmur.  9.  Past history of viral spinal meningitis.  10. Hemorrhoids.  11. Urinary incontinence.  12. Benign prostatic hypertrophy.  13. Past history of urinary tract infection.  14. Restless leg syndrome.  15. Past history of blood loss and anemia requiring transfusion.   PROCEDURE:  On January 22, 2005, left total knee arthroplastic, computer  navigation assisted.   SURGEON:  [name], M.D.   ASSISTANT:  Alexzandrew L. Perkins, P.A.-C.   ANESTHESIA:  Spinal.   ESTIMATED BLOOD LOSS:  Minimal.   DRAINS:  Hemovac drains x1.   TOURNIQUET TIME:  58 minutes at 300 mmHg.   CONSULTATIONS:  [facility], Dr.  Rene Paci.   HISTORY:  [name] is an 77 year old male with severe end-stage  osteoarthritis of the left knee with intractable pain who has undergone  previous successful right total knee and now presents for a left total knee.   LABORATORY DATA:  CBC on admission revealed a hemoglobin of 13.5, hematocrit  39.4, white cell count normal at 7.2, differential within normal limits.  Postop hemoglobin 10.2, hemoglobin did climb down a level of 8.4, but was  back up on the date of discharge of 8.8 with a hematocrit of 25.1.  PT and  PTT preop 12.4 and 29 respectively, INR 0.9.  Last PT-INR 18.3 and 1.8.  Chem panel on admission all within normal limits.  Serum BMETs are followed.  Electrolytes remained within normal limits.  However, his BUN went up from  12 to 28 back down to 16.  Creatinine went up from 1.0 to 2.0, back down to  1.5.  Urinalysis with trace leukocyte esterase without epithelial cells,  only 3-6 white.  Blood group type A+.  Chest  x-ray dated March 13, 2004:  No  active lung disease.  EKG dated June 12, 2004:  Sinus bradycardia with  marked sinus arrhythmia, otherwise normal confirmed by Dr. Jerral Bonito.   HOSPITAL COURSE:  The patient was admitted to [facility] and taken  to the OR and underwent above procedure.  Later transferred to the floor for  continued postop care.  The patient had a bump in his BUN and creatinine up  to 20 and 1.9 postoperatively and had some asymptomatic hypotension which  was lower than his preop blood pressures recorded.  It was likely due to the  IV narcotics.  It was also noted that he was on lisinopril which is an ACE  inhibitor.  He was given fluid bolus despite having positive I and O's, but  felt that he, with the low pressure, is now perfusing very well with some  mild renal insufficiency.  The IV narcotics were stopped.  He had also had  low output since midnight.  Rehab consult was called.  The patient was seen  in  consultation by rehab services and felt that could possibly benefit for a  short stay in rehab prior to going home.  After bolusing of the fluids, he  had little improvement with his urinary output.  His BUN went up a little  further to the high level of 28 and 2.0.  He was given more fluids and a  medical consult was called over to [facility], Dr. Rene Paci,  M.D. [facility] who was cross-covering for Dr. Ruthine Dose.  His BUN and creatinine did  improve a little bit during that afternoon.  It was later checked on the  afternoon of day #2.  His BUN came down a little bit to 27 and creatinine  decreased a little bit to 1.9.  His hemoglobin was down to 9.1.  He was  symptomatic with his mild hypotension.  No light-headedness or dizziness.  He was asymptomatic with his lower hemoglobin.  He was actually doing pretty  well and started to get up with physical therapy despite some of the pain.  He was seen in consultation by Dr. Felicity Coyer.  His fluids were continued.  Lisinopril and ACE inhibitor were held.  The narcotics were minimized.  PCA  had been discontinued.  The patient did respond to this treatment.  By day  #3, his pressure had stabilized and it was back up.  Again, he was  asymptomatic.  His BUN and creatinine proved further down to 16 and 1.5.  He  responded to the fluids well.  His urine output improved with the IV fluids  and holding the ACE inhibitor.  He was seen again by Dr. Felicity Coyer on  January 25, 2005.  He had improved from a medical standpoint and medical  services signed off at that time.  It was felt that he would be going home  the following day.  He was seen the following morning on January 26, 2005  by Dr. Lequita Halt.  He was doing well medically.  His hemoglobin actually  improved which had dropped down to 8.4 and was back up to 8.8, so it was on  the upswing.  He was medically stable, doing well, progressing well with physical therapy and was discharged home.  It was  felt that due to the fact  that he progressed well with PT, he would not require inpatient rehab  therefore discharge planning arranged with home health.   DISCHARGE PLAN:  The patient is discharged home on January 26, 2005.   DISCHARGE DIAGNOSES:  See above.   DISCHARGE MEDICATIONS:  1.  He is to resume his lisinopril once he goes home.  Medications from the orthopedic service include:  1.  Coumadin as needed for prophylaxis.  2.  Percocet for pain.  3.  Robaxin for spasm.   ACTIVITY:  Weightbearing as tolerated to left lower extremity.  Home health  PT and home health nursing per total knee protocol.  Weightbearing as  tolerated.   DIET:  Cardiac diet.   FURTHER INSTRUCTIONS:  He may start showering, however, do not submerge  incision under water.  Daily dressing changes.   FOLLOW UP:  Followup in two weeks.  Call the office for an appointment at  [phone].   DISPOSITION:  To home.   CONDITION ON DISCHARGE:  Improved.      ALP/MEDQ  D:  01/26/2005  T:  01/26/2005  Job:  [id number]   cc:   [name], M.D.  [facility]  [address]  [address]  [place]  [place] [address]  Fax: [phone]   Angelena Sole, M.D. [facility]   Maisie Fus D. Riley Kill, M.D. [facility]   Rene Paci, M.D. [facility]  [address] [place], [place] [address]

## 2011-04-20 NOTE — Op Note (Signed)
NAMELISLE, SKILLMAN NO.:  [id number]   MEDICAL RECORD NO.:  [id number]          PATIENT TYPE:  INP   LOCATION:  2001                         FACILITY:  [facility]   PHYSICIAN:  Rollene Rotunda, M.D.   DATE OF BIRTH:  August 27, 1921   DATE OF PROCEDURE:  01/22/2006  DATE OF DISCHARGE:                                 OPERATIVE REPORT   PRIMARY CARE PHYSICIAN:  Dr. Willow Ora.   REFERRING PHYSICIAN:  Kerin Perna, M.D.   PREOPERATIVE DIAGNOSIS:  Syncope.   POSTOPERATIVE DIAGNOSIS:  Syncope.   PROCEDURE PERFORMED:  Implantation of a loop recorder.   DESCRIPTION OF PROCEDURE:  Appropriate informed consent was obtained.  The  patient was brought to the electrophysiology laboratory.  A subclavicular  area was prepped and draped in sterile fashion.  Skin was anesthetized using  30 mL of lidocaine on a subcutaneous and spinal needle.  A 3 cm incision was  carried out after mapping the appropriate area per the [employer]  representative.  The incision was carried through the skin.  Blunt  dissection was used to carry the incision down to the prepectoral fascia.  A  pocket was then formed again with blunt dissection above the prepectoral  fascia.  Appropriate hemostasis was achieved using minimal electrocautery.  The pocket was copiously irrigated with Kanamycin solution.  Silk sutures  were utilized to secure the device to the prepectoral fascia x 2.  A  Medtronic Reveal Plus number [id number], serial number [id number] was then  inserted and again irrigated with Kanamycin solution.  The wound was again  inspected to ensure hemostasis.  The wound was then closed with 2-0 Vicryl  in a running vertical mattress followed by running horizontal mattress. The  superficial layer was a 4-0 Vicryl.  The wound was covered with sterile  benzoin and Steri-Strips.  There were no apparent complications.  The  patient tolerated the procedure well and left the lab in stable condition.     ______________________________  Rollene Rotunda, M.D.     JH/MEDQ  D:  01/22/2006  T:  01/23/2006  Job:  [id number]

## 2011-04-20 NOTE — Op Note (Signed)
[name], [name]                        ACCOUNT NO.:  [id number]   MEDICAL RECORD NO.:  [id number]                   PATIENT TYPE:  AMB   LOCATION:  DSC                                  FACILITY:  [facility]   PHYSICIAN:  Katy Fitch. Naaman Plummer., M.D.          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   PROCEDURE:  Release of right transverse carpal ligament.   SURGEON:  Katy Fitch. Sypher, M.D.   ASSISTANT:  Jonni Sanger, P.A.   ANESTHESIA:  General by LMA.   SUPERVISING ANESTHESIOLOGIST:  Guadalupe Maple, M.D.   INDICATIONS FOR PROCEDURE:  The patient is an 78 year old gentleman who has  had severe bilateral carpal tunnel syndrome for more than two years.  He  postponed treatment due to an unsatisfactory response that his daughter had  had to prior carpal tunnel surgery.   He had been evaluated by Dr. Sandria Manly, neurologist, and Dr. Amanda Pea, both  identifying bilateral carpal tunnel syndrome.  He ultimately elected to  proceed with release of his right transverse carpal ligament at this time  after a thorough consultation and third opinion with our office.   Preoperatively,  he was advised that due to the severe nature of his carpal  tunnel syndrome, he would need to except six months to pass until he saw the  maximum recovery of his median nerve function.   After informed consent he is brought to the operating room at this time.   DESCRIPTION OF PROCEDURE:  The patient is brought to the operating room and  placed in the supine position on the operating table.  Following induction  of general anesthesia by LMA the right arm was prepped with Betadine scrub  and solution and sterile draped.   Following  exsanguination of the limb with an Esmarch bandage, an arterial  tourniquet was inflated on the proximal brachium to 240 mmHg.  The procedure  commenced with a short incision in the line of the ring finger and the palm.  The subcutaneous tissues were carefully divided around the palmar fascia.  This was split longitudinally to reveal the commissural branch of the median  nerve.  These were followed back to the transverse carpal ligament which was  carefully isolated from the median nerve.   The ligament was released along its ulnar border extending into the distal  forearm.  This widely opened the carpal canal.  No masses or other pigments  were noted.  Pertinent bleeding points along the margin of the released  ligament were electrocauterized with bipolar current followed by repair of  the skin with intradermal 3-0 Prolene.   A compressive dressing was applied with a volar  plastic splint maintaining  the wrist in five degrees of dorsiflexion.   There were no apparent complications.                                               Katy Fitch Naaman Plummer., M.D.    RVS/MEDQ  D:  03/14/2004  T:  03/14/2004  Job:  [id number]   cc:   Genene Churn. Love, M.D.  [address]  [address]  [place]  [place] [address]  Fax: [phone]   Dionne Ano. Everlene Other, M.D.  [address]  [place]  [place] [address]  Fax: [phone]

## 2011-04-20 NOTE — Cardiovascular Report (Signed)
NAMEANGUS, [name] NO.:  [name]   MEDICAL RECORD NO.:  [id number]          PATIENT TYPE:  INP   LOCATION:  2014                         FACILITY:  [facility]   PHYSICIAN:  [name], M.D. [facility] OF BIRTH:  [birth year]   DATE OF PROCEDURE:  01/16/2006  DATE OF DISCHARGE:                              CARDIAC CATHETERIZATION   INDICATIONS:  [name] is well known to me. He is an 77 year old  gentleman who previously had an anterior wall infarction treated with  fibrinolytic therapy, and subsequent elective PTCA 13 years earlier. He has  been stable with the most recent Cardiolite suggesting only mild apical  ischemia. He has had an episode of syncope that was thought to be either  neurally mediated or from orthostasis. Following this, he has recently had  an additional episode of syncope. He has been seen and evaluated by EP who  thought that he needed cardiac catheterization. He was brought to the  catheterization laboratory for further evaluation.   PROCEDURES:  1.  Left heart catheterization.  2.  Selective coronary arteriography.  3.  Selective left ventriculography.  4.  Distal aortography.   DESCRIPTION OF PROCEDURE:  The patient was brought to the catheterization  laboratory, prepped and draped in the usual fashion. Through an anterior  puncture, the right femoral artery was easily entered. There was some  tortuosity in the distal aorta with some curling of the wire. We therefore  placed the right femoral sheath over the wire, then took a right coronary  catheter into the distal aorta over the wire and then directed the wire in  the proximal aortic root direction. We were easily able to navigate up into  this area. Following this, we were able to take views of the nondominant  right coronary artery over a wire exchange. Views of the left coronary  artery were then obtained. Central aortic and left ventricular pressures  were measured with a  pigtail. Ventriculography was performed in the RAO  projection. Following pressure pullback, distal aortography was then  performed. He tolerated the procedure without complication. Dr. Graciela [name] was in  the back room, and we reviewed the films and discussed the various options.  The patient has not had a lot of angina, but unfortunately has critical  three-vessel disease.   He was taken to the holding area in satisfactory clinical condition.   HEMODYNAMIC DATA:  1.  Central aortic pressure 151/85, mean 116.  2.  Left ventricular pressure 163/11.  3.  No gradient on pullback across the aortic valve.   ANGIOGRAPHIC DATA:  1.  Ventriculography was done in the RAO projection. Overall systolic      function was vigorous. No segmental wall motion abnormalities were      identified. Ejection fraction was greater than 55%.  2.  Distal aortography reveals some tortuosity in the distal aorta and iliac      system. There was some calcification as well. The renal arteries appear      to be widely patent. There was mild atherosclerotic change in the distal      aorta.  3.  The left main is calcified as is the LAD and circumflex. The left main      probably has about 30% diffuse irregularities  4.  The LAD courses to the apex. There are tandem lesions of about 70% and      then 90% after the first diagonal and across the first septal before the      second diagonal. There is also a 90% second diagonal stenosis. The      distal LAD is moderate in size and suitable for grafting, but also      calcified.  5.  The circumflex is a dominant vessel. There are two tiny marginal      branches both was subtotal disease. There is an 90% eccentric focal      stenosis prior to the bifurcation of the large third marginal. The third      marginal divides and has some disease in the inferior subbranch. The AV      circumflex courses posteriorly and provides a PDA. The PDA actually has      a 90% stenosis.  6.  The  right coronary is a nondominant vessel with segmental 70% disease      proximally.   IMPRESSION:  1.  Preserved overall left ventricular function.  2.  Progressive critical disease that represents three-vessel disease in      that the disease is a high-grade lesion involving the circumflex system      which is dominant vessel.   DISPOSITION:  The patient is 54 and he does not have a lot of angina.  Nonetheless, he does have severe disease. We will get a surgical consult. We  could approach this from standpoint of percutaneous intervention and/or  continued on medical therapy. I discussed the case with Dr. Graciela [name] in light  of his normal LV function and new right bundle. He feels that a loop  recorder is required since we do not have documented evidence of heart  block. We will get surgical consult first and then decide where to proceed.      [name], M.D. [facility]  Electronically Signed     TDS/MEDQ  D:  01/16/2006  T:  01/16/2006  Job:  [id number]   cc:   [name], M.D. [facility]  [address]  [address]  [place]  [place] [address]   CV Laboratory   Patient's medical record   Wanda Plump, MD [facility]  [phone] [address] [place], [place] [address]

## 2011-04-20 NOTE — Assessment & Plan Note (Signed)
[facility]                         ELECTROPHYSIOLOGY OFFICE NOTE   NAME:[name], [name]                     MRN:          [id number]  DATE:01/29/2007                            DOB:          Dec 26, 1920    [name] returns today for followup and evaluation of palpitations.  The patient is a very pleasant elderly man with a history of symptomatic  tachy-brady syndrome who has had frequent palpitations in the past and  documented paroxysms of atrial fibrillation for which he is on Coumadin  therapy.  The patient underwent pacemaker insertion by Dr. Graciela Husbands several  weeks ago.  He continues to have palpitations in the presence twice  daily of Carbetalol which he did not take prior to the pacemaker implant  secondary to bradycardia.  He states that he has not had any syncope but  does get a little bit dizzy and lightheaded.  However, these  palpitations are not sustained.  He has had no chest pain or shortness  of breath.   On exam, he is a pleasant elderly appearing man in no acute distress.  Blood pressure today was 160/90, pulse was 79 and regular, respirations  were 18.  NECK:  No jugular venous distention.  LUNGS:  Clear bilaterally to auscultation.  There were no wheezes, rales  or rhonchi.  CARDIOVASCULAR:  Regular rate and rhythm with normal S1 and S2.  EXTREMITIES:  No edema.   MEDICATIONS:  1. Carbetalol 6 mg twice daily.  2. Lisinopril 20 a day.  3. Aspirin 81 mg daily.  4. Coumadin as directed.  5. Prilosec.   Interrogation of his pacemaker demonstrates a Medtronic Adapta with  pacing impedances of 503 and 547.  The patient was in the AAIR/DDR mode.  Review of the histograms demonstrated there were no mode switching  episodes (the mode switch trigger episode was greater than 30 seconds)  and there were no ventricular high rate episodes noted.  There were 268  PVCs.   IMPRESSION:  1. Palpitations following pacemaker  implantation.  2. Symptomatic tachy-brady syndrome.  3. Paroxysmal atrial fibrillation.  4. Coronary disease.   DISCUSSION:  Overall, [name] is stable.  I think with his blood  pressure being up and frequent palpitations that we should up titrate  his beta blocker therapy.  To this effect, we will  increase him to 12 mg twice daily of the Carbetalol.  He will see Dr.  Graciela Husbands back as previously scheduled in 2 months.     Doylene Canning. Ladona Ridgel, MD  Electronically Signed    GWT/MedQ  DD: 01/29/2007  DT: 01/29/2007  Job #: [id number]
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
## 2011-04-20 NOTE — Discharge Summary (Signed)
[name], [name] NO.:  [id number]   MEDICAL RECORD NO.:  [id number]          PATIENT TYPE:  OIB   LOCATION:  3707                         FACILITY:  [facility]   PHYSICIAN:  Maple Mirza, PA   DATE OF BIRTH:  Apr 12, 1921   DATE OF ADMISSION:  12/23/2006  DATE OF DISCHARGE:  12/24/2006                               DISCHARGE SUMMARY   ALLERGIES:  This patient has no known drug allergies.   PRINCIPAL DIAGNOSES:  1. Discharged on day one, status post explant of loop recorder placed      February of 2007.  2. Day one status post implant Medtronic Adapta dual-chamber      pacemaker.  The patient has had no complications.  3. Tachybrady syndrome as an indication for pacemaker placement.  4. Paroxysmal atrial fibrillation, now starting on Coumadin therapy.  5. History of syncope, February of 2007, with electrophysiology study      demonstrating non-inducible ventricular dysrhythmia. Subsequent      loop implant, February of 2007.  6. History of anterior wall myocardial infarction 14 years ago,      treated with lytic therapy and PTCA.  7. Unstable angina, finding of severe three-vessel coronary artery      disease at cath, February of 2007, status post coronary artery      bypass graft surgery.  8. Bradycardia/right bundle branch block.  9. Hypertension.  10.Ejection fraction greater than 55% at catheterization, February of      2007.   PROCEDURE:  1. December 23, 2006, explant of existing loop recorder.  2. December 23, 2006, implant Medtronic Adapta dual-chamber pacemaker,      Dr. [name].  The patient has had no postprocedural      complications.  In the postprocedure, the patient was started on      Coreg 6.25 mg b.i.d. and on Coumadin 5 mg daily or as directed by      Coumadin Clinic.   BRIEF HISTORY:  [name] is an 77 year old male.  He had a history of  syncope and presented in February of 2007.  Catheterization was  performed showing severe  three-vessel coronary artery disease and he had  subsequent coronary artery bypass graft surgery at that time.  Ejection  fraction determined at echocardiogram in February of 2007 showed an  ejection fraction greater than 55%.  He also had an electrophysiology  study which showed non-inducible for ventricular dysrhythmias.  Post  CABG, he had atrial fibrillation treated with amiodarone, subsequently  discontinued.  The patient appears to have paroxysmal atrial  fibrillation.  He has episodes where he gets fuzzy headed although these  are not clearly associated with his atrial fibrillation.  The patient  also bradycardia and right bundle branch block with tachybrady syndrome.  Results of loop recorder and the patient's clinical presentation have  been reviewed with Dr. Riley Kill.  The decision has been made to go  forward with implantation of permanent pacemaker so as to begin the beta  blocker therapy as well as to correct any preexistent bradycardia  attributable to the tachybrady syndrome.  The patient  will present  electively for pacemaker implantation.  Risks and benefits have been  described.  The patient wishes to proceed.  Augmented rate control will  follow pacemaker implant with Coreg.  He will then be started on  Coumadin as well.   HOSPITAL COURSE:  The patient presented January 21 for explantation of  his existing loop recorder, placed February 2007 and implantation of  pacemaker. As the patient was leaving the short-stay Center at [facility] to have a chest x-ray, his wife actually had a syncopal  episode.  This will be treated in a separate dictation note although I  will mention that she is doing fine now after an overnight stay.  The  patient underwent explantation of his loop recorder and implant of a  Medtronic Adapta dual-chamber pacemaker without complication.  The  patient was seen on postoperative day number one.  His incision was  healing nicely without  hematoma.  Chest x-ray shows no pneumothorax and  leads are in appropriate position.  The device has been interrogated.  All values within normal limits and normal device function.  The patient  will be discharged January 22.  He is asked to keep his incision dry  until Monday, January 28.  He is asked not to drive for one week and to  lift nothing heavier than 10 pounds for the next four weeks.   DISCHARGE MEDICATIONS:  1. Lisinopril 20 mg daily.  2. Coreg 6.25 mg twice daily.  3. Prilosec 20 mg as needed.  4. Coumadin 5 mg daily or as directed by the Coumadin Clinic at      [facility], [address], [place].  5. Enteric-coated aspirin 81 mg daily to continue.  6. The patient will go home with a prescription for Darvocet-N 100 one      to two tabs every four to six hours as needed for pain.   He has follow-up appointments all at [facility], [address].  1. Coumadin Clinic, Thursday, January 24 at 1:30.  2. Pacer Clinic, Thursday, January 31 at 9 o'clock in the morning.  3. He will see Dr. Graciela Husbands on Apr 15, 2007 at 9:10 in the morning.   Laboratory studies pertinent to this admission were taken January 14.  White cells 7, hemoglobin 13.4, hematocrit 39.7, platelets 243, protime  11.5, INR 0.9.  Serum electrolytes:  sodium 145, potassium 4.3, chloride  108, carbonate 32, glucose 91, BUN 16, creatinine 1.2, alkaline  phosphatase 16, SGOT 17, SGPT is not registered.  He also had thyroid  studies; the TSH is 2.34.      Maple Mirza, PA     GM/MEDQ  D:  12/24/2006  T:  12/24/2006  Job:  [id number]   cc:   Duke Salvia, MD, [facility]  Arturo Morton. Riley Kill, MD, [facility]  Willow Ora, MD

## 2011-04-20 NOTE — H&P (Signed)
NAMETRIPP, GOINS NO.:  [id number]   MEDICAL RECORD NO.:  [id number]          PATIENT TYPE:  INP   LOCATION:  4703                         FACILITY:  [facility]   PHYSICIAN:  Wanda Plump, MD [facility]    DATE OF BIRTH:  August 14, 1921   DATE OF ADMISSION:  10/22/2005  DATE OF DISCHARGE:                                HISTORY & PHYSICAL   CHIEF COMPLAINT:  Fall.   HISTORY OF PRESENT ILLNESS:  Mr. Sontag is an 77 year old gentleman who was  doing well until last night.  He woke up, went to the bathroom and he  suddenly lost his balance and ended up falling in the bathtub.  He did not  have a trip and fall, he just lost his balance.  Denies any loss of  consciousness, chest pain, or shortness of breath immediately after the  incident; however, he did complain of tight chest a few hours later and also  he feels full or bloated at the chest and this feeling goes up to his  neck.  He also felt slightly nauseous after the incident and slightly dizzy.  The dizziness is described as thick head.   PAST MEDICAL HISTORY:  1.  Coronary artery disease status post MI 1993.  2.  CHF.  3.  High cholesterol.  4.  Hypertension.  5.  B12 deficiency.  6.  BPH.  7.  History of spinal stenosis.  8.  Status post Cardiolite in February 2006 which was basically negative.  9.  Status post TURP procedure that was redone at [facility].  He was left with      some bladder incontinence.  10. Status post a left knee replacement.   FAMILY HISTORY:  Noncontributory at this time.   SOCIAL HISTORY:  He quit tobacco in 1960 and does not smoke.   MEDICATIONS:  1.  Lisinopril 20 one p.o. b.i.d.  2.  Temazepam 15 one p.o. q.h.s. p.r.n.  3.  Baby aspirin once a day.  4.  He is intolerant of STATINS and also Zetia cause leg pain.   REVIEW OF SYSTEMS:  He denies any fever, lower extremity edema, palpitation,  or recent diarrhea.  He did not have any bladder or bowel incontinence after  this episode of near  syncope.  He denies also stroke symptoms such as  slurred speech, focal deficits, or diplopia.   ALLERGIES:  No allergies per se, but again, he is intolerant to STATINS and  cannot take Zetia.   PHYSICAL EXAMINATION:  GENERAL:  The patient is alert, oriented, in no  apparent distress.  VITAL SIGNS:  He weighs 194 pounds, afebrile, pulse 60, respirations 24,  blood pressure 148/92.  LUNGS:  Clear to auscultation bilaterally.  CARDIOVASCULAR:  Regular rate and rhythm without a murmur.  NECK:  He has good carotid pulses.  ABDOMEN:  Not distended, soft, good bowel sounds.  EXTREMITIES:  No edema.  NEUROLOGIC:  Speech, gait, and motor are intact.  Romberg sign is absent.  He is coherent, cooperative.   LABORATORY AND X-RAYS:  Hemoglobin screen at the office is 14.6.  His EKG  shows sinus bradycardia without any acute changes.  I do not see any changes  compared to his previous EKG.   ASSESSMENT/PLAN:  The patient presents today with what seems to be a near  syncope.  He does have a history of coronary artery disease.  Even though  this may be trivial fall, I am concerned about the fact that this could  represent a transient ischemic attack and also heart disease given the fact  that he had some chest tightness a few hours after this event.  I am  planning to admit him to telemetry, rule him out with serial enzymes, get a  CT of the head, carotid ultrasound, and call cardiology.  If he is doing  well and all the work-up is negative most likely he will be able to be  discharged home soon.      Wanda Plump, MD [facility]  Electronically Signed     JEP/MEDQ  D:  10/22/2005  T:  10/23/2005  Job:  [id number]

## 2011-04-20 NOTE — Discharge Summary (Signed)
[name], [name]                        ACCOUNT NO.:  [id number]   MEDICAL RECORD NO.:  [id number]                   PATIENT TYPE:  INP   LOCATION:  0462                                 FACILITY:  [facility]   PHYSICIAN:  Ollen Gross, M.D.                 DATE OF BIRTH:  [birth year]   DATE OF ADMISSION:  06/12/2004  DATE OF DISCHARGE:  06/19/2004                                 DISCHARGE SUMMARY   ADMISSION DIAGNOSES:  1. Osteoarthritis, bilateral knees, right more symptomatic than left.  2. Hypertension.  3. History of myocardial infarction in 1993.  4. Status post cardiac catheterization with balloon angioplasty.  5. Heart murmur.  6. History of viral spinal meningitis.  7. Hemorrhoids.  8. Benign prostatic hypertrophy.  9. History of urinary tract infection.  10.      Restless leg syndrome.   DISCHARGE DIAGNOSES:  1. Osteoarthritis, right knee, status post right total knee arthroplasty,     computer navigation assisted.  2. Osteoarthritis, left knee.  3. Postoperative blood loss anemia.  4. Status post transfusion without sequela.  5. Postoperative hyponatremia, improved.  6. Hypertension.  7. History of myocardial infarction in 1993.  8. Status post cardiac catheterization with balloon angioplasty.  9. Heart murmur.  10.      History of viral spinal meningitis.  11.      Hemorrhoids.  12.      Benign prostatic hypertrophy.  13.      History of urinary tract infection.  14.      Restless leg syndrome.  15.      Postoperative azotemia, resolved (elevated BUN and creatinine).   PROCEDURE:  On June 12, 2004, right total knee arthroplasty with computer  navigation assistance.  Surgeon was Dr. Homero Fellers Aluisio.  Assistant was Avel Peace, P.A.-C.  Spinal anesthesia.  Minimal blood loss.  Hemovac drain x1.  Tourniquet time 77 minutes at 300 mmHg.   CONSULTATIONS:  Rehab services, Dr. Wynn Banker.   HISTORY:  [name] is an 77 year old male with end-stage arthritis of  both knees, right more symptomatic than left.  Pain has been intractable.  Benefit from undergoing knee replacement.  Risks and benefits discussed.  The patient is subsequently admitted to the hospital.   LABORATORY DATA:  CBC on admission showed a hemoglobin of 13.3, hematocrit  38.1, white blood cell count 7.2, red cell count 4.3, differential within  normal limits.  Postoperative H&H 10.6 and 30.9.  Hemoglobin drifted down to  7.7 and 22.3.  Hemoglobin back up to 10.7 and 30.9 hematocrit.  PT and PTT  on admission were 11.9 and 35, respectively.  INR is 0.8.  Serial  prothrombin time's followed per Coumadin protocol.  Last noted PT/INR 22.9  and 2.7.  Chem panel on admission all within normal limits with the  exception of a minimally elevated total bilirubin of 1.5.  Serial BMET's  were followed.  Sodium did drop down from 140 down to 130 back up to 139.  Glucose went up from 102 to 133 back down to 104.  BUN jumped up from 8 to  16 and got as high as 16, and was back down to 18.  Creatinine bumped up  from 0.9 to 1.9 back down to 1.2.  Calcium dropped from 9.2 to 8.2.  Urinalysis negative.  Blood group type A positive.   HOSPITAL COURSE:  The patient was admitted to [facility], taken to  the OR, underwent the above stated procedure without complications.  The  patient tolerated the procedure well, later transferred to the recovery room  and then to the orthopaedic floor for continued postoperative care.  Vital  signs were followed.  The patient was placed on PCA and p.o. analgesics for  pain control following surgery.  Placed weightbearing as tolerated.  PT and  OT were consulted.  Hemovac drain which was placed at time of surgery was  pulled on postoperative day #1.  The patient had low urinary output  throughout the evening after surgery.  Was given fluid challenge.  By day  #2, the patient is doing a little bit better.  Had some agitation on the  evening of postoperative  day #1, but the IV narcotics were discontinued, and  we reduced the pain pill.  His hemoglobin had drifted down from 10.6 on day  #1 to 9 on day #2.  The fluids and narcotics were discontinued.  By day #3,  the patient is doing better.  The agitation had resolved, although his  hemoglobin had drifted down to 7.7, and it was felt that he would require  blood.  He was given 2 units of blood.  He tolerated the transfusion quite  well.  He was real slow to progress with physical therapy initially from the  agitation and also receiving blood.  He was only up ambulating approximately  10 feet by postoperative day #4.  A rehab consult was called.  The patient  was seen by Dr. Wynn Banker for rehab services who felt he would benefit from  a rehab or SACU stay.  PT was recommending SACU due to the slow progression.  By day #4, he was doing a little bit better.  He had received the blood and  tolerated it well, but he had some trouble with his CPM on day #3, and we  asked the ortho tech to assist with management of the CPM while he was in  the hospital.  At this point, he was slowly progressing with therapy and it  was felt he would best be served by going over to rehab SACU.  It was  decided he would be transferred at which time a bed became available.  He  continued to receive therapy.  By day #5, he was up ambulating approximately  40 feet and slowly improving.  A dressing change was initiated on  postoperative day #2, and the incision continued to heal well throughout the  hospital course.  The Foley was discontinued by day #5.  Continued to  receive therapy through the weekend.  On Monday, June 19, 2004, it was  noticed that through the weekend he progressed very well with physical  therapy and ambulated 150 feet over the weekend.  It was felt he was doing  so well that he would not require rehab stay.  He had turned the corner and became much more independent.  The patient was in  agreement with  going home.  Arrangements were made and he was discharged home on June 19, 2004.   PLAN:  The patient is discharged home on June 19, 2004.   DISCHARGE DIAGNOSES:  Please see above.   DISCHARGE MEDICATIONS:  1. Coumadin.  2. Vicodin.  3. Robaxin.   DIET:  Low sodium, cardiac diet.   FOLLOWUP:  Two weeks from surgery, call the office for appointment.   ACTIVITY:  Weightbearing as tolerated to the right lower extremity.  Home  health PT and home health nursing.  Total knee protocol.   DISPOSITION:  Home.   CONDITION ON DISCHARGE:  Improved.     Alexzandrew L. Julien Girt, P.A.              Ollen Gross, M.D.    ALP/MEDQ  D:  07/26/2004  T:  07/26/2004  Job:  [id number]   cc:   Angelena Sole, M.D. [facility]   Maisie Fus D. Riley Kill, M.D. [facility]

## 2011-04-20 NOTE — Op Note (Signed)
NAMENIGEL, ERICSSON NO.:  [id number]   MEDICAL RECORD NO.:  [id number]          PATIENT TYPE:  INP   LOCATION:  0009                         FACILITY:  [facility]   PHYSICIAN:  Ollen Gross, M.D.    DATE OF BIRTH:  1921/07/26   DATE OF PROCEDURE:  01/22/2005  DATE OF DISCHARGE:                                 OPERATIVE REPORT   PREOPERATIVE DIAGNOSIS:  Osteoarthritis, left knee.   POSTOPERATIVE DIAGNOSIS:  Osteoarthritis, left knee.   PROCEDURE:  Left total knee arthroplasty, computer navigated.   SURGEON:  Ollen Gross, M.D.   ASSISTANT:  Avel Peace, PA-C.   ANESTHESIA:  Spinal.   ESTIMATED BLOOD LOSS:  Minimal.   DRAINS:  Hemovac x1.   TOURNIQUET TIME:  Fifty-eight minutes at 300 mmHg.   COMPLICATIONS:  None.   CONDITION:  Stable to the recovery room.   CLINICAL NOTE:  [name] is an 77 year old male with severe end-stage  arthritis of the left knee with intractable pain.  He has undergone a  previous successful right total knee arthroplasty and presents now for left  total knee arthroplasty.   PROCEDURE IN DETAIL:  After successful administration of spinal anesthetic,  a tourniquet is placed high on his left thigh, and his right lower extremity  prepped and draped in the usual sterile fashion.  The extremity is wrapped  in esmarch, the knee flexed, and the tourniquet inflated to 300 mmHg.  A  standard midline incision was made with a 10 blade through the subcutaneous  tissue to the level of the extensor mechanism, where a fresh blade is used  to make a medial parapatellar arthrotomy.  The soft tissue over the proximal  medial tibia is subperiosteally elevated to the joint line with a knife and  into the semimembranosus bursa with a Cobb elevator.  The soft tissue over  the proximal lateral tibia is also elevated with attention being paid to  avoid the patellar tendon on the tibial tubercle.  The patella is everted,  and the knee flexed to  90 degrees.  ACL and PCL removed.  The anterior  aspect of both menisci are removed also.  The 4 mm Shantz pins are then  placed, two into the femur and two into the tibia.  The computerized arrays  were then attached to the screws.  Inputting of the anatomical data is then  performed in order to generate the computer-driven models of the femur and  tibia.  The alignment is found to be approximately 12 degrees varus and 7-8  degree flexion contracture.   We began the bony cuts.  First did the distal femoral cut.  The distal  femoral cutting block is placed under computer guidance to remove 10 mm off  the distal femur with being neutral varus/valgus and neutral  flexion/extension.  The cut is made with the oscillating saw and is  confirmed to be as planned with the computer.  The rotational guide is then  placed on the distal femur, and we had chosen the epicondylar axis as our  point of reference.  We then marked the  rotation and placed the size 5  cutting block.  The computer guided [place] to a size 5, and we confirmed  intraoperatively with the intraoperative measuring devices the 5 is the most  appropriate.  The cutting block is then placed, and the anterior, posterior,  and chamfer cuts are made on the femur.   The tibia is subluxed forward, and the remainder of the menisci are removed.  The proximal tibial cutting guide is placed with the computer guidance.  In  order to resect 10 mm off the nondeficient lateral side, we pinned the block  for neutral varus valgus, 2 degrees of flexion, and a resection level of 10  mm.  The tibial resection is then made with an oscillating saw.  The cut is  confirmed to be as planned.  A size 4 is the appropriate size, and the  proximal tibia is then prepared with the modular drill and keel punch for a  size 4.  The femoral preparation is then completed with the intercondylar  cut for the size 5.   A size 5 posterior stabilized femoral trial with a size  4 mobile-bearing  tibial trial and a 10 mm posterior stabilized rotating platform insert trial  was placed.  With the 10, full extension is achieved with excellent varus  and valgus balance throughout full range of motion.  We confirmed to achieve  full extension and were within 1 degree of varus from being fully neutral in  our alignment.  The patella is then everted, and the thickness was measured  to be 27 mm.  Free-hand resection is taken to 15 mm.  The 41 template is  placed.  Lug holes are drilled.  The trial patella is placed, and it tracks  normally.  The osteophytes are then removed off the posterior femur with the  trial in place.  All trials were removed, and the cut-bone surfaces are  prepared with pulsatile lavage.  Cement is mixed, and once ready for  implantation, a size 4 mobile-bearing tibial tray, size 5 posterior  stabilized femur, and 41 patella are cemented into place.  The patella is  held with a clamp.  The trial 10 mm insert is placed.  The knee held in full  extension, and all extruded cement is removed.  Once the cement is fully  hardened, then the permanent 10 mm posterior stabilized rotating platform  insert is placed into the tibial tray.  The wound is copiously irrigated  with saline solution.  The extensor mechanism closed over a Hemovac drain  with interrupted #1 PDS.  Flexion against gravity is 130 degrees.  The  tourniquet is released for a total time of 58 minutes.  The subcu was closed  with interrupted 2-0 Vicryl and subcuticular running 4-0 Monocryl.  The  incision is clean and dry.  Steri-Strips and a bulky sterile dressing  applied.  The drain is then hooked to suction.  He is placed into a knee  immobilizer, awakened and transported to recovery in stable condition.      FA/MEDQ  D:  01/22/2005  T:  01/22/2005  Job:  [id number]

## 2011-04-20 NOTE — Op Note (Signed)
NAMEJUSTUS, [name] NO.:  [id number]   MEDICAL RECORD NO.:  [id number]          PATIENT TYPE:  INP   LOCATION:  2305                         FACILITY:  [facility]   PHYSICIAN:  Kerin Perna, M.D.  DATE OF BIRTH:  June 05, 1921   DATE OF PROCEDURE:  01/17/2006  DATE OF DISCHARGE:                                 OPERATIVE REPORT   OPERATION:  Coronary artery bypass grafting x4 (left internal mammary artery  to LAD, saphenous vein graft to diagonal, saphenous vein graft to posterior  descending, saphenous vein graft to distal circumflex).   PREOPERATIVE DIAGNOSES:  1.  Class IV unstable angina.  2.  Severe three-vessel coronary artery disease.  3.  Syncope.   POSTOPERATIVE DIAGNOSES:  1.  Class IV unstable angina.  2.  Severe three-vessel coronary artery disease.  3.  Syncope.   SURGEON:  Kerin Perna, M.D.   ASSISTANT:  Gershon Crane PA-C.   ANESTHESIA:  General.   ANESTHESIOLOGIST:  Burna Forts, M.D.   INDICATIONS:  The patient is an 77 year old male with hypertension and  hyperlipidemia, and prior history of anterior MI with angioplasty. He  presented with an episode of upper chest and neck fullness, followed by  syncope. His cardiac enzymes were unremarkable.  He was placed on heparin  and underwent diagnostic cardiac catheterization by Dr. Riley Kill. This  demonstrated preserved LV systolic function with high-grade stenosis of the  LAD diagonal (90%), high-grade stenosis of the dominant circumflex (95%),  and high-grade stenosis of the posterior descending (90%). He is felt to be  a candidate for surgical revascularization, based on his coronary anatomy  and his symptoms.   Prior to the surgery I examined the patient in his hospital room, and  discussed results of the cardiac catheterization with the patient and  family. I discussed the indications and expected benefits of coronary bypass  surgery for treatment of his coronary disease. I  discussed the alternatives  to surgical therapy as well. I discussed with the patient and wife the major  aspects of the planned procedure, including the choice of conduits for  grafts, the location of the surgical incisions, the use of general  anesthesia and cardiopulmonary bypass, and the expected postoperative  hospital recovery. I discussed with the patient the risks to him of coronary  bypass surgery, including risks of MI, CVA, bleeding, infection, and death.  He understood these implications for the surgery and agreed to proceed with  the operation as planned, under what I felt was an informed consent.   OPERATIVE FINDINGS:  The coronaries were severely diseased. There was no  evidence of myocardial scarring or injury. The saphenous vein was harvested  endoscopically from the right leg and was a good vessel. The internal  mammary artery was a small 1.5 mm vessel, but had excellent flow. The  patient received a unit of packed cells during cardiopulmonary bypass, for a  hemoglobin of 7 grams. The patient received the aprotinin protocol to reduce  perioperative bleeding and transfusion requirements. I felt that the risks  of aprotinin were weighed by the benefits of  decreased bleeding and the anti-  inflammatory effects of the drug.   PROCEDURE:  The patient was brought to the operating room and placed supine  on the operating room table.  General anesthesia was induced under invasive  hemodynamic monitoring. The chest, abdomen and legs were prepped with  Betadine and draped as a sterile field. A sternal incision was made, and the  saphenous vein was harvested endoscopically from the right leg. The internal  mammary artery was harvested as a pedicle graft from its origin at the  subclavian vessels. It had excellent flow. Heparin was administered and ACT  was documented as being therapeutic. The sternal retractor was placed, and  the pericardium was opened and suspended. Pursestrings  were  placed in the  ascending aorta and right atrium, and the patient was cannulated and placed  on bypass. The coronaries were identified for grafting. The right coronary  was a nondominant small vessel and was not graftable. The mammary artery and  vein grafts were prepared for the distal anastomoses. Cardioplegia catheters  were placed for both antegrade aortic and retrograde coronary sinus  cardioplegia. The patient was cooled to 32 degrees. The aortic crossclamp  was carefully applied and 800 cc of cold blood cardioplegia was delivered to  the aortic root and coronary sinus -- with good cardioplegic arrest. Septal  temperature dropped less than 15 degrees. Topical iced saline was used to  augment myocardial preservation.   The distal coronary anastomoses were then performed. The first distal  anastomosis was the posterior descending. This was the distal branch of the  dominant circumflex. It had a proximal 90% stenosis. A reverse saphenous  vein was sewn end-to-side with running 7-0 Prolene.  There was good flow  through graft. The posterior descending was 1.75 mm vessel.   The second distal anastomosis was to the posterolateral branch of the distal  circumflex. This was a 1.5 mm vessel with proximal 90% stenosis at the  origin of the circumflex. The reverse saphenous vein was sewn end-to-side  with running 7-0 Prolene.  There was excellent flow through the graft.  Cardioplegia was redosed.   The third distal anastomosis was to the diagonal branch of the LAD. This had  a proximal 80-90% stenosis. A reverse saphenous vein was sewn end-to-side  with running 7-0 Prolene. There was good flow through the graft.  Cardioplegia was redosed.   The fourth distal anastomosis was the distal third of the LAD. This was a  1.5-mm vessel and had a proximal 80-90% stenosis. The left internal mammary  artery pedicle was brought out through an opening created in the left lateral pericardium, and  was brought down onto the LAD and sewn end-to-side  with running 8-0 Prolene. There was excellent flow through the anastomosis,  after briefly releasing the pedicle bulldog on the mammary artery. The  bulldog was reapplied to the mammary pedicle, and the pedicle was secured to  the epicardium.   Cardioplegia was redosed. While the crossclamp was still in place, 3  proximal vein anastomoses were performed on the ascending aorta using a 4.5-  mm punch with running 6-0 Prolene. Prior to tying the final proximal  anastomosis, air was vented from the coronaries and the left side of heart  using a dose of retrograde warm blood cardioplegia, and the usual de-airing  maneuvers on bypass. The crossclamp was then removed.   The heart resumed a spontaneous rhythm. Air was aspirated from the vein  grafts with a 27 gauge needle.  The cardioplegia catheters were removed. The  vein grafts were opened, and each had excellent flow with hemostasis being  documented at the proximal distal anastomoses. The patient was rewarmed to  37 degrees. Temporary pacing wires were applied. When the patient was  adequately rewarmed and reperfused, the lungs were re-expanded and the  ventilator was resumed. The patient was weaned from bypass without  difficulty. Cardiac output and blood pressure were stable. He was in a slow  sinus rhythm. Atrial pacing was initiated. The patient was given protamine  and the cannulas were removed. There was no adverse reaction to the  protamine. The mediastinum was irrigated with warm antibiotic irrigation.  The leg incision was irrigated and closed in a standard fashion. The  superior pericardial fat was closed. Two mediastinal and a left pleural  chest tube were placed and brought out through separate incisions. The  sternum was closed with interrupted steel wire. The pectoralis fascia was  closed with a  running #1 Vicryl. Subcutaneous and skin were closed in  running Vicryl, and  sterile dressings were applied.   TOTAL BYPASS TIME:  120 min.   CROSSCLAMP TIME:  78 min.      Kerin Perna, M.D.  Electronically Signed     PV/MEDQ  D:  01/17/2006  T:  01/17/2006  Job:  [id number]

## 2011-04-20 NOTE — Assessment & Plan Note (Signed)
[facility]                         ELECTROPHYSIOLOGY OFFICE NOTE   NAME:[name], [name] NORDQUIST                     MRN:          [id number]  DATE:01/02/2007                            DOB:          12/07/1920    [name] was seen today in the clinic on the 31st of January, 2008,  for a wound check of his newly implanted Medtronic Model #ADDRO1 Adapta.  Date of implant was December 23, 2006, for tachy brady syndrome.  It also  will be noted that he did have an internal loop monitor that was removed  at the same time of implant.  On interrogation of his device today his  battery voltage was 2.78, P waves measured 1.4 to 2 millivolts with an  atrial capture threshold of 0.75 volts at 0.4 milliseconds and an atrial  lead impedance of 510.  The R waves measured 2.8 to 4 millivolts and  those numbers were double checked with a ventricular lead impedance of  557 ohms and a ventricular pacing threshold of 0.75 volts at 0.4  milliseconds.   NUMBERS AT IMPLANT:  His R waves measured 11.8 millivolts with a  ventricular lead impedance of 782.  No changes were made in his  parameters.  I did remove a Steri-Strip today.  His wound was without  redness or edema.  I will bring this gentleman back in a week to just  recheck his R waves.      [name] Harm, LPN  Electronically Signed      Duke Salvia, MD, [facility]  Electronically Signed   PO/MedQ  DD: 01/02/2007  DT: 01/02/2007  Job #: [id number]

## 2011-04-20 NOTE — Assessment & Plan Note (Signed)
[facility]                            CARDIOLOGY OFFICE NOTE   NAME:[name], [name]                     MRN:          [id number]  DATE:11/28/2006                            DOB:          [birth year]    [name] is in for follow up. In general, he has been stable. He  brings in his blood pressures that generally have been under excellent  control. He denies any symptoms. His loop recorder was interrogated, and  he has a short run of SVT, but it does not look severe. His loop  recorder has been in since February.   Today, on exam, the blood pressure was 194/92, the pulse is 53. Earlier  this morning, his blood pressure was 117/65, and he has had his machine  checked at rehabilitation. The remainder of the examination is  unchanged.   His EKG reveals sinus bradycardia with a rate of 53 and right bundle  branch block.   He is stable. He will remain on the same current medications. I have not  increased his antihypertensives because of all of his pressures from  home has been in the reasonable range. There are a few that are slightly  elevated, but most of them range from about 130/70 range. We will plan  to see him back in followup in three months, and I will ask Dr. Graciela Husbands  about whether or not he should continue to have his loop recorder and  when this should be explanted.     Arturo Morton. Riley Kill, MD, [facility]  Electronically Signed    TDS/MedQ  DD: 11/28/2006  DT: 11/28/2006  Job #: [id number]   cc:   Willow Ora, MD

## 2011-04-20 NOTE — Discharge Summary (Signed)
[name], [name]                        ACCOUNT NO.:  [id number]   MEDICAL RECORD NO.:  [id number]                   PATIENT TYPE:  INP   LOCATION:  0440                                 FACILITY:  [facility]   PHYSICIAN:  Rene Paci, M.D. [facility]          DATE OF BIRTH:  1921-06-16   DATE OF ADMISSION:  08/18/2002  DATE OF DISCHARGE:  08/21/2002                                 DISCHARGE SUMMARY   DISCHARGE DIAGNOSES:  1. Aseptic meningitis, iatrogenic.  2. Spinal stenosis causing back pain.  3. Increased isolated indirect bilirubin consistent with Gilberts.  4. Urinary retention exacerbated by narcotics/benign prostatic hypertrophy.   DISCHARGE MEDICATIONS:  1. Flomax 0.4 mg p.o. q.d.  2. Vicodin 5/500 one to two tablets p.o. q.6h. p.r.n. pain dispense #40.   DISPOSITION:  The patient is discharged to home in improved condition.  He  has, after discussion with myself and infectious disease specialist Dr. Burnice Logan, declined continued antibiotic course after these four days of  hospitalization.  Agrees to return to the emergency room or contact his  physician in the event of increased headache or fever once home.   HOSPITAL COURSE BY PROBLEM:  The patient is a 77 year old gentleman who  presented to the emergency room 24 hours post attempted epidural injection  for symptomatic treatment of his severe spinal stenosis.  He had increased  fever and severe headache concerning for meningitis.  While in the emergency  room he underwent a lumbar guided spinal tap which appeared to confirm  meningitis.  The patient was then admitted for IV antibiotics and further  evaluation.   1. Meningitis, sterile cultures, iatrogenically induced secondary to     chemical irritant versus sterilized bacterial cause.  The patient was     begun on IV Fortaz and vancomycin and admitted for observation in the     intensive care unit.  His neurologic status improved and his fever  subsequently resolved.  Cultures did not confirm any consistent organism     that may have been introduced during the procedure described and     accordingly infectious diseases Dr. Lenn Sink was consulted for his     opinion.  Dr. Roxan Hockey felt that the patient's meningitis symptoms were     more consistent with a chemical reaction rather than true bacterial     meningitis from the epidural procedure.  As the patient remained afebrile     and cultures were sterile, the patient was given the option to continue a     two-week course of Rocephin via a PICC line at home versus continue with     pain management of spinal stenosis with Vicodin off antibiotics.  The     patient opted for the latter and was accordingly sent home on Vicodin and     no further antibiotic treatment.  He was instructed repeatedly to contact     the emergency room or  his physician for recurrent symptoms of meningitis     and he agreed to do so.  2. Urinary hesitancy.  The patient has never been formally diagnoses with     BPH.  On day #2 of hospitalization the patient began to have difficulty     with hesitation and dribbling.  A Foley catheter was placed with moderate     difficulty and then discontinued on the day prior to discharge.  With     ambulation and decreased narcotic doses the patient's urinary flow     improved.  Per patient request he was begun on Flomax for BPH symptoms     and will     continue this on an outpatient basis.  3. Spinal stenosis.  This is an ongoing chronic problem.  From this point     forward the patient elects to manage his pain conservatively with oral     narcotics as needed.                                               Rene Paci, M.D. [facility]    VL/MEDQ  D:  09/03/2002  T:  09/03/2002  Job:  [id number]

## 2011-04-20 NOTE — Assessment & Plan Note (Signed)
[facility]                            CARDIOLOGY OFFICE NOTE   NAME:Basque, CLARON ROSENCRANS                     MRN:          [id number]  DATE:03/10/2007                            DOB:          20-Apr-1921    CARDIOLOGIST:  Dr. Shawnie Pons.   HISTORY OF PRESENT ILLNESS:  Mr. Zeiter is an 77 year old male patient  followed by Dr. Riley Kill with a history of coronary disease, status post  anterior wall myocardial infarction 1993 and status post CABG x4 in  February 2007, who recently had a pacemaker implanted secondary to  tachybrady syndrome with paroxysmal atrial fibrillation and a history of  syncope.  This was done December 23, 2006.  He last saw Dr. Riley Kill on  February 24, 2007.  At that time, he had some hemorrhoidal bleeding and  elevated blood pressures.  His blood pressure medicines were adjusted,  and his Coumadin was adjusted to a goal INR of 1.8 to 2.2, secondary to  his hemorrhoidal bleeding.  Of note, blood count done at that time was  normal with a hemoglobin of 13.4.  He returns today for followup.  He  continues to note some palpitations.  He actually has some tachy  palpitations that awoke him this morning.  He describes some discomfort  with this but no exertional anginal symptoms.  He denied any syncope or  near syncope, denies any shortness of breath, denies any orthopnea or  paroxysmal nocturnal dyspnea.   CURRENT MEDICATIONS:  1. Prilosec 20 mg a day.  2. Coumadin as directed. 2.5 mg b.i.d.  3. Lisinopril 20 mg twice daily.   ALLERGIES:  NO KNOWN DRUG ALLERGIES.   PHYSICAL EXAMINATION:  GENERAL:  Well-nourished, well-developed, in no  distress.  VITAL SIGNS:  Blood pressure is 182/90, pulse 67.  Repeat blood pressure  by me is 154/80 on the right, 168/90 on the left.  Weight 207 pounds.  HEENT:  Unremarkable.  NECK:  Without JVD.  CARDIOVASCULAR:  S1, S2, regular rate and rhythm.  LUNGS:  Clear to auscultation bilaterally  without wheezes, rhonchi or  rales.  ABDOMEN:  Soft, nontender.  EXTREMITIES:  Without edema.  Calves are soft, nontender.  SKIN:  Warm and dry.  NEUROLOGIC:  He is alert, oriented x3.  Cranial nerves 2-12 grossly  intact.   Electrocardiogram reveals atrial pacing with a ventricular rate of 67.  Interrogation of his pacemaker reveals no mode switches, no high atrial  rates.  He has had recorded 164 PVC singles, 106 PVC runs, since his  pacemaker was last interrogated in February of 2008.   IMPRESSION:  1. Coronary artery disease.      a.     Status post angioplasty to the LAD in 1993, secondary to       anterior wall myocardial infarction.      b.     Status post coronary artery bypass graft x4 January 17, 2006.  2. Preserved LV function.  3. Paroxysmal atrial fibrillation.  4. Tachybrady syndrome status post pacemaker implantation.      a.  History of syncope.  5. History of dyslipidemia, untreated secondary to myalgias.  6. History of spinal stenosis.  7. History of bladder outlet obstruction.  8. Palpitations.   PLAN:  The patient returns to the office today for followup.  His blood  pressures are still somewhat uncontrolled.  He brought some numbers in  with him today.  Overall, they are not terrible but could use better  control.  I think with his history of palpitations, we could go up on  his Coreg to 18.75 mg twice a day.  This should help his blood pressure,  as well as some of his palpitations.  There is nothing showing up on his  pacemaker interrogation today that would explain his palpitations.  We  will go ahead and set him up for 30-day event monitor.  I actually had a  chance to talk to Dr. Riley Kill on the telephone.  He suggested we check  an echocardiogram.  We will set him up for that as well.  I have also  asked that we get a BMET to assess his renal function and potassium.  The patient will return for followup in the next 4 to 6 weeks with Dr.   Riley Kill.  He knows to return sooner or call us, should he have any  changing or worsening symptoms.      Tereso Newcomer, PA-C  Electronically Signed      Jonelle Sidle, MD  Electronically Signed   SW/MedQ  DD: 03/10/2007  DT: 03/10/2007  Job #: [id number]

## 2011-04-20 NOTE — Assessment & Plan Note (Signed)
[facility]                         ELECTROPHYSIOLOGY OFFICE NOTE   NAME:[name], [name]                     MRN:          [id number]  DATE:01/08/2007                            DOB:          1921/05/05    [name] was seen today in the clinic on January 08, 2007 for a  recheck of his R waves from his newly implanted Medtronic model #[id number]  Adapta. Date of implant was December 23, 2006 for tachybrady syndrome. On  interrogation of his device today, his battery voltage is 2.77, his R  waves measured today 5.6 to 8.0 millivolts which is greatly improved  from January 31 of this year when they were 2.8 to 4.0 with a  ventricular lead impedance of 538 ohms. His wound was well-healed and he  will return in May to see Dr. Graciela Husbands.      Altha Harm, LPN  Electronically Signed      Duke Salvia, MD, [facility]  Electronically Signed   PO/MedQ  DD: 01/08/2007  DT: 01/08/2007  Job #: [id number]

## 2011-04-20 NOTE — Assessment & Plan Note (Signed)
[facility]                           ELECTROPHYSIOLOGY OFFICE NOTE   NAME:[name], [name] TURPEN                     MRN:          [id number]  DATE:07/24/2006                            DOB:          [birth year]    [name] has had no recurrent syncope.  He has no chest pain apart from  little sticky pains, one over one of his wires, one over the loop, and one  underneath his left breast.  These are transient.   His medications are reviewed, and it is notable that he is on aspirin and  Altace.  He is not on a beta blocker.   PHYSICAL EXAMINATION:  VITAL SIGNS:  On examination today, his blood  pressure was 128/72, pulse 65.  LUNGS:  Clear.  HEART:  Heart sounds were regular.  EXTREMITIES:  Without edema.   Interrogation of his loop recorder demonstrated no episodes.   IMPRESSION:  1. Ischemic heart disease with prior bypass.  2. Syncope.  3. Normal left ventricular function.  4. Bradycardia, precluding beta blocker therapy.   [name] is stable.  He will be seeing Dr. Riley Kill in about two months  time.  I will ask him at that point to interrogate his device, and if he is  going to see him every three months, loop recorder interrogation could be  done with him at that time, and we will see him again if there is an  abnormality on the tracings.                                   Duke Salvia, MD, [facility]   SCK/MedQ  DD:  07/24/2006  DT:  07/24/2006  Job #:  [id number]

## 2011-04-20 NOTE — Op Note (Signed)
NAMEABDULKAREEM, BADOLATO                        ACCOUNT NO.:  [id number]   MEDICAL RECORD NO.:  [id number]                   PATIENT TYPE:  AMB   LOCATION:  DSC                                  FACILITY:  [facility]   PHYSICIAN:  Katy Fitch. Naaman Plummer., M.D.          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   POSTOPERATIVE DIAGNOSES:  Herniated disk and spondylosis, C5-6 and C6-7.   OPERATION PERFORMED:  Release of left transverse carpal ligament.   SURGEON:  Katy Fitch. Sypher, M.D.   ASSISTANT:  Jonni Sanger, P.A.   ANESTHESIA:  General by LMA.   SUPERVISING ANESTHESIOLOGIST:  Judie Petit, M.D.   DESCRIPTION OF PROCEDURE:  The patient was brought to the operating room and  placed in supine position upon the operating table.  Following induction of  general anesthesia by LMA, the left arm was prepped with Betadine soap and  solution and sterilely draped.  Following exsanguination of the left arm  with an Esmarch bandage, an arterial tourniquet on the proximal brachium was  inflated to 240 mmHg.  The procedure commenced with a short incision in line  with the ring finger in the palm.  Subcutaneous tissues are carefully  divided revealing the palmar fascia.  This was split longitudinally to  reveal the common sensory branch of the median nerve.  This was followed  back to the transverse carpal ligament which was carefully isolated from the  median nerve.  The ligament was released on its ulnar border extending to  the distal forearm.  This widely opened the carpal canal.  No masses or  other predicaments were noted.   Elevated systolic blood pressure led to problems with intraoperative  bleeding.  Therefore, the tourniquet was released and hemostasis was  achieved by direct pressure for  approximately five minutes.  Thereafter the  wound was inspected and no significant bleeding was encountered.  The wound  was repaired with intradermal 3-0 Prolene suture followed by placement of a  sterile dressing utilizing Steri-Strips, Xeroflo, sterile gauze, sterile  Webril and a volar plaster splint maintaining the wrist in five degrees  dorsiflexion.  Mr. Canning was awakened from anesthesia and transferred to  recovery room with stable vital signs.  He will be discharged to the care of  his family with prescriptions for Percocet 5 mg one or two tablets by mouth  every four to six hours as needed for pain.  He was given 20 tablets without  refill.  He will return to our office in follow-up in one week for dressing  change and advancement to an exercise program.  Katy Fitch Naaman Plummer., M.D.    RVS/MEDQ  D:  04/04/2004  T:  04/05/2004  Job:  [id number]

## 2011-04-20 NOTE — Op Note (Signed)
[name], [name]                        ACCOUNT NO.:  [id number]   MEDICAL RECORD NO.:  [id number]                   PATIENT TYPE:  INP   LOCATION:  0009                                 FACILITY:  [facility]   PHYSICIAN:  Ollen Gross, M.D.                 DATE OF BIRTH:  [birth year]   DATE OF PROCEDURE:  06/12/2004  DATE OF DISCHARGE:                                 OPERATIVE REPORT   PREOPERATIVE DIAGNOSIS:  Osteoarthritis, right knee.   POSTOPERATIVE DIAGNOSIS:  Osteoarthritis, right knee.   PROCEDURE:  Right total knee arthroplasty with computer navigation.   SURGEON:  Gus Rankin. Aluisio, M.D.   ASSISTANT:  Avel Peace, PA-C   ANESTHESIA:  Spinal.   ESTIMATED BLOOD LOSS:  Minimal.   DRAIN:  Hemovac x 1.   TOURNIQUET TIME:  77 minutes at 300 mmHg.   COMPLICATIONS:  None.   CONDITION:  Stable to recovery.   BRIEF CLINICAL NOTE:  [name] is an 77 year old male with end-stage  arthritis of the right knee with intractable pain.  He presents now for  right total knee arthroplasty.   PROCEDURE IN DETAIL:  After the successful administration of spinal  anesthetic, a tourniquet is placed high on the right thigh and right lower  extremity prepped and draped in the usual sterile fashion.  Extremity is  wrapped in Esmarch, knee flexed, tourniquet inflated to 300 mmHg.  Midline  incision is made with a 10 blade through subcutaneous tissue to the level of  the extensor mechanism.  A fresh blade is used to make a medial parapatellar  arthrotomy.  Then the soft tissue over the proximal and medial tibia is  subperiosteally elevated to the joint line with a knife and then to  semimembranous bursa with a Cobb elevator.  Soft tissue over the proximal  and lateral tibia is also elevated with attention being paid to avoiding the  patellar tendon on tibial tubercle.  The patella is subluxed, knee flexed to  90 degrees, and the ACL and PCL are removed.  The 4 mm pins are then  placed,  2 into the tibia, 2 into the femur for the reference guides for the  computer.  The computer Arrays are then placed.  Once the computer  recognized them, then we went through the registration process for the  points on the tibia and subsequently the femur.  The miles are generated and  confirmed.  His preoperative alignment is a 10-degree flexion contracture  with 9 degrees of varus.  We subsequently placed the distal femoral cutting  block at the appropriate configuration, and it was found to be at the  appropriate place by the computer, and then it is pinned, and the distal  femoral cut is made removing 10 mm off the distal femur.  The size on the  computer was a 5, and it did correspond to the size that we checked  intraoperatively.  The rotation is marked off the epicondylar axis, and the  AP cutting block is placed for a size 5.  Anterior and posterior cuts are  then made.   Tibia is subluxed forward, and the menisci are removed.  The tibial cutting  guide is placed and pinned to take off 10 mm off the less deficient lateral  side.  This was confirmed to be at the appropriate inclination and  appropriate height by the computer.  I also placed the extramedullary tibial  alignment guide, and it did coincide with the points we would usually  utilize.  A tibia resection is then made with an oscillating saw.  Size 4 is  the most appropriate tibial component, and then the proximal tibia is  prepared with the modular drill and keel punch for the size 4.  The femoral  preparation is then completed with the intercondylar and chamfer cuts for  the size 5.   Trial 4 mobile bearing tibial tray and then a size 5 posterior stabilized  femoral trial is placed with a 10 mm posterior stabilized rotating platform  insert trial.  Full extension is achieved with excellent varus and valgus  balance throughout full range of motion.  The patella is then everted,  thickness measured to be 26.5  mm.  The free-hand resection is taken to 15  mm, 41 template placed, lug holes drilled, trial patella is placed, and it  tracks normally.  The pins for the computer are removed from the femur and  tibia.  The osteophytes are then removed off the posterior femur with the  trial in place.  All trials are removed and the cut bone surfaces prepared  with pulsatile lavage.  Cement is mixed and once ready for implantation, the  size 4 mobile bearing tibial tray, size 5 posterior stabilized femur, and 41  patella are cemented in place; patella is held with a clamp.  Trial 10 mm  insert is placed, knee held in full extension, and all extruded cement  removed.  Once the cement is fully hardened, then the permanent 10 mm  posterior stabilized rotating platform insert is placed into the tibial  tray.  The wound is copiously irrigated with antibiotic solution and  extensor mechanism closed over a Hemovac drain with interrupted #1 PDS.  Flexion against gravity was 135 degrees.  Tourniquet is released for a total  time of 77 minutes.  The subcu is closed with interrupted 2-0 Vicryl,  subcuticular with running 4-0 Monocryl.  The incision is cleaned and dried  and Steri-Strips and a bulky sterile dressing applied.  He is subsequently  awakened, and transported to recovery in stable condition.                                               Ollen Gross, M.D.    FA/MEDQ  D:  06/12/2004  T:  06/12/2004  Job:  [id number]

## 2011-04-20 NOTE — Assessment & Plan Note (Signed)
[facility]                            CARDIOLOGY OFFICE NOTE   NAME:Cauthon, [name]                     MRN:          [id number]  DATE:02/24/2007                            DOB:          1921-08-29    [name] is in for followup.  To briefly summarize, the patient has  had a pacemaker placed.  He is on anticoagulant therapy and has had some  bleeding in the hemorrhoids; this has continued.  He had had a little  bit of discomfort in the neck, but overall he has been okay.   MEDICATIONS:  1. Lisinopril 20 mg p.o. b.i.d.  2. Aspirin 81 mg daily.  3. Prilosec 20 mg daily.  4. Coumadin.  5. Coreg 12.5 mg b.i.d.   PHYSICAL EXAMINATION:  Initially, his blood pressure was 212/110; on  repeat, it was 180/80.  Pulse was 75.  The lung fields were clear.  The pacer site looks good.   I had them interrogate the pacemaker, and this was after the patient  came in and laid down.  With this, his baseline rate was down in the 60s  and he was tracking the atrium.   We also examined his hemorrhoids; there was minimal blood on the  surface.   We will go ahead and get an INR as well as a hemoglobin to recheck his  status.  I told him to go ahead and stop his aspirin currently.  We are  going to decrease his Coumadin to 5 mg a day.  We are going to shoot for  an INR goal of 1.8 to 2.2 with his hemorrhoids.  With his blood pressure  being elevated, we will also increase his lisinopril to 20 mg b.i.d.  I  will have him return to see me within 1 week and we will then try to get  him potentially to Dr. Earlene [name].     Arturo Morton. Riley Kill, MD, [facility]  Electronically Signed    TDS/MedQ  DD: 02/26/2007  DT: 02/26/2007  Job #: [id number]

## 2011-04-20 NOTE — H&P (Signed)
NAME:  [name], [name]                        ACCOUNT NO.:  [id number]   MEDICAL RECORD NO.:  [id number]                   PATIENT TYPE:  INP   LOCATION:  0160                                 FACILITY:  [facility]   PHYSICIAN:  Isla Pence, M.D. [facility]         DATE OF BIRTH:  [birth year]   DATE OF ADMISSION:  08/18/2002  DATE OF DISCHARGE:                                HISTORY & PHYSICAL   IDENTIFYING STATEMENT:  This is a 77 year old white male with a known  history of severe spinal stenosis, whose primary care physician is Dr. Lutricia Horsfall with the [facility], whose neurosurgeon is Dr. Franky Macho,  whose cardiologist is Dr. Riley Kill.   CHIEF COMPLAINT:  Headache this morning and temperature of 101.5 this  afternoon.   HISTORY OF PRESENT ILLNESS:  This 77 year old white male with a history of  severe lumbar stenosis, status post attempted epidural injection with  penetration actually into the subarachnoid yesterday, comes in with  complaints of headache and temperature.  The patient normally does not get  any headaches but started experiencing headache this morning when he was  sitting down to have breakfast.  He had called over to [facility], which is where  he had the procedure done, about the headache, and they had recommended that  he take Tylenol and increase his fluid intake.  He did not tell them about  his chills.  He apparently was not having any fever until later but was  having chills at about 9:30 this morning.  The fever started about 3:30 in  the afternoon, and he reported a temperature of 101.5.  Initially he denied  any neck pain but later said yes.  Denied any paresthesias.  No visual  disturbances.  He continues to have persistent low back pain with radiation  to both his lower extremities, right greater than left.  He did have some  nausea and vomited x 1 after he drank some juice and took some Tylenol for  his fever.  He denied any URI symptoms.  No sinus  problems.  Denied any  allergy symptoms.  Denied any ear pain or sore throat.  He has had no recent  antibiotic use either.  Once again, as previously mentioned, he denies  really getting frequent headaches or ever getting headaches.   The patient in the ER had a lumbar-guided spinal tap when I first came in to  see him.  Dr. Barrington Ellison came back out after he had done the procedure and said  that the spinal fluid was very cloudy and purulent-looking.   ALLERGIES:  No known drug allergies.   CURRENT MEDICATIONS:  1. Lisinopril 20 mg p.o. b.i.d.  2. Vitamin E 400 IU per day.  3. Ecotrin 81 mg p.o. q.d.; however, he stopped this a few days prior to the     epidural procedure.  4. Extra-strength Tylenol p.r.n. for his back pain.  5. He  was given narcotic pain medicines by Dr. Franky Macho, but it has not     helped.  Therefore, he has not really been taking it.   PAST MEDICAL HISTORY:  1. Severe spinal stenosis at the L4-L5 level.  At L3-L4 he has mild to     moderate stenosis, and at L5-S1 he has moderate bilateral neural     foraminal narrowing but no central canal stenosis, and this was as noted     by the MRI of his L-spine from June of this year.  2. Bilateral carpal tunnel.  3. Coronary artery disease, status post PTCA in 1993, of one vessel.  He     apparently had a repeat stress test done in 2002, but the patient tells     me that the equipment was finally determined to be faulty.  He was     supposed to have had a repeat stress test, but he never went back for it,     and he has remained asymptomatic.  As mentioned earlier, Dr. Riley Kill     follows him also.  4. History of hypertension.  5. History of hyperlipidemia.  Had been on Lipitor.  He developed some     myalgias in his legs and had some trouble walking.  He was taken off of     Lipitor and was recommended to be placed on Zocor, but he had been     hesitant.  6. Denies any history of diabetes mellitus.  7. Once again, denies  chronic sinus problems.   PAST SURGICAL HISTORY:  1. In 1993, he had PTCA of one vessel, and he has remained asymptomatic     since then.  2. March 1996, he underwent TURP which apparently was complicated and was     admitted at [facility] at that time for five days.  Subsequent to that had     developed some urethral stricture problems for which he underwent     urethral reconstruction at [facility] in March 1997.  3. Status post right inguinal herniorrhaphy.   SOCIAL HISTORY:  He has been married for 54 years.  It is his first and only  marriage.  He has two children, a son and a daughter.  They are both  healthy.  Tobacco:  He quit in 1960.  He used to smoke about a pack per day  or one pack every three days and also a pipe after the cigarettes for  several years, but he has not done any tobacco since 1960.  No alcohol  intake.  He is retired.  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  Exercises doing some back light exercises.  Diet-wise he says he  is not very carefully, but the wife says that generally they eat a good  nutritious menu.   REVIEW OF SYSTEMS:  As per HPI.  Denies any chest pain, shortness of breath,  orthopnea, lower extremity edema.  No abdominal pain, no heartburn, melena,  hematochezia.  No dysuria either.  He has noted frequency of urination  mostly at night, which is not new.  It is primarily actually nocturia.  He  continues to have the persistent low back pain with radiation into his legs.   PHYSICAL EXAMINATION:  VITAL SIGNS:  Initial temperature was 99.5, blood  pressure 156/90, heart rate of 102, respiratory rate was 34, pulse oximetry  was 96% on room air.  His temperature later was 101.0.  He was given 1 g of  Tylenol in the ER.  GENERAL:  Alert and oriented x 3.  He has ___________ on his forehead  because of the headache.  He occasionally screams out, mild screaming, secondary to back pain and leg pain with sudden leg movements.  HEENT:  TMs  are normal.  Oropharynx shows some dry mucous membranes.  He has  dentures.  There is no erythema.  His oropharynx has mild postnasal drip.  NECK:  Slightly rigid but negative for Brudzinski.  No adenopathy.  LUNGS:  Clear to auscultation bilaterally.  Without any crackles or wheezes.  HEART:  Regular rate and rhythm.  With occasional ectopic beats.  I do not  hear any murmur.  ABDOMEN:  Bowel sounds are normal.  Soft and nontender.  Not distended.  No  organomegaly.  BACK:  No spinous tenderness.  No fluctuance or purulence at the LP site.  EXTREMITIES:  Moves all extremities.  No lower extremity edema.  NEUROLOGIC:  Alert and oriented.  Cranial nerves II-XII are intact to  confrontation.  Muscle strength is +5/5 in the upper extremities and +4/5 in  the bilateral lower extremities. Toes are downgoing.  His Kernig and  Brudzinski definitely are negative on my exam currently.  He certainly has  pain into his leg with straight leg testing.   LABORATORY DATA:  CSF fluid:  The cell count has come back showing total  cell count of 14,500.  The rest of his CSF studies are still pending.  His  white count was 16.6, H&H 14.2 and 40.3, platelet count of 231,000, PMNs are  91%, lymphocytes 5, greater than 20% bands.  His CMP showed a sodium of 138,  potassium of 3.9, chloride and CO2 of 104 and 26, BUN and creatinine 13 and  1, glucose of 135, total bilirubin was slightly elevated at 1.9.  The rest  of his LFTs really are normal.  Calcium is 9.1, albumin of 4.  UA showed  specific gravity of 1.022, ketones of 40, glucose is negative.  He had small  leukocytes, negative for blood, negative for nitrite.  Micro was 0-2 wbc's.   CT of the head was negative for any acute changes.  He had some chronic  sinus changes.  Chest x-ray showed no acute process.   ASSESSMENT AND PLAN:  1. Bacterial meningitis, status post attempted epidural, actually, which     ended up being a subarachnoid.  Need to then  cover for Staphylococcus     aureus and Streptococcus pneumoniae and also gram-negative bacilli.     Therefore, will use vancomycin and ceftazidime.  We will go ahead and     admit him, at least for now, in the stepdown unit, in which I think he     will do fine.  He really is alert and oriented and not showing any     confusional signs at all either.  Will need to follow his CSF fluid     culture.  I have asked the laboratory to hold his extra CSF fluid for at     least 24 hours for any other studies.  We will also need to follow up on     blood cultures which, apparently, were done in the ER.  2. In regards to his dehydration, will continue him on IV fluids.  3. In regards to his history of lumbar stenosis, will give him some Vicodin     as needed for pain.  He probably will be a good candidate  for OxyContin.     I have held back on giving the OxyContin, though, since I need him to be    a little bit more alert with his current infection.  4. In regards to his history of hypertension, will continue his lisinopril.  5. In regards to his gastrointestinal prophylaxis, will place him on     Protonix.  6. In regards to deep vein thrombosis prophylaxis, will do _________ boot.     For now I am holding on the Lovenox just because of status post lumbar     puncture and, with the meningeal irritation, I want to minimize any     possibility of bleed.                                               Isla Pence, M.D. [facility]    RRV/MEDQ  D:  08/18/2002  T:  08/19/2002  Job:  [id number]   cc:   Angelena Sole, M.D. [facility]

## 2011-04-20 NOTE — Assessment & Plan Note (Signed)
[facility]                                 ON-CALL NOTE   NAME:[name], DONNE BALEY                     MRN:          [id number]  DATE:12/08/2006                            DOB:          1921-07-16    PRIMARY CARE [name]:  Arturo Morton. Riley Kill, MD, [facility].   I received a phone call from [name] wife this morning to report  an episode of tachycardia.  The patient has a loop recorder in place,  and she was told to call and report any abnormalities with his heart  rate to Dr. Riley Kill.  The patient apparently was restless last night and  was unable to sleep.  He denied any chest pain, shortness of breath, or  palpitations at that time.  He got up and took a temazepam sleeping  pill, and approximately 30 minutes later the patient noticed an increase  in his heart rate.  He also took his blood pressure and found it to be  elevated at 190 systolic.  The patient had no chest pain or shortness of  breath associated with that as well.  The wife states that the elevated  blood pressure and rapid heart rate lasted approximately 20 to 30  minutes.  The patient ended up going to bed because he did become  sleepy, and he was able to sleep through the night.  The patient awoke  this morning and they did take his blood pressure again.  It was 140/76  and his heart rate was 62 beats per minute.  Again, he was asymptomatic  this morning as well with no further complaints, and there was no  associated shortness of breath or chest pain this morning.   The wife was told to report any change in his heart rate with the use of  his loop recorder, and says that she was calling to do so.  The patient  has an appointment with Dr. Riley Kill in March of 2008.  I called and left  a message with our office for a message to be left with Dr. Rosalyn Charters  nurse, to see if Dr. Riley Kill needed to see him sooner than later  secondary to this episode.  I also told the wife that I would be  leaving  this message with the office with the hopes that the office would call  in followup.  The wife verbalized understanding.  We will leave it to  Dr. Rosalyn Charters discretion whether or not he needs to be seen sooner or to  continue on his current medication regimen as prescribed by Dr. Riley Kill.  I have made no medication changes at this time.  Loop recorder to be  interrogated at Dr. Rosalyn Charters discretion.      [name]. Lyman Bishop, NP  Electronically Signed      Jonelle Sidle, MD  Electronically Signed   KML/MedQ  DD: 12/08/2006  DT: 12/08/2006  Job #: [id number]

## 2011-04-20 NOTE — H&P (Signed)
[name], [name]                        ACCOUNT NO.:  [id number]   MEDICAL RECORD NO.:  [id number]                   PATIENT TYPE:  INP   LOCATION:  0462                                 FACILITY:  [facility]   PHYSICIAN:  Alexzandrew L. Julien Girt, P.A.        DATE OF BIRTH:  1921/10/14   DATE OF ADMISSION:  06/12/2004  DATE OF DISCHARGE:                                HISTORY & PHYSICAL   IDENTIFYING INFORMATION:  Date of office visit History and Physical June 01, 2004.   CHIEF COMPLAINT:  Bilateral knee pain.   HISTORY OF PRESENT ILLNESS:  The patient is a 77 year old male who has been  seen by Dr. Lequita Halt for ongoing bilateral knee pain.  He has been treated in  the past for his bilateral osteoarthritis.  He has undergone nonoperative  treatments in the past using medications and also injections.  He has been  seen and treated by Dr. Lequita Halt although he still has continued pain.  His  pain has been refractory to nonoperative management.  It is felt he would  benefit from undergoing surgical intervention.  Risks and benefits have been  discussed and the patient elected to proceed with surgery.   ALLERGIES:  No known drug allergies..   CURRENT MEDICATIONS:  Lisinopril 20 mg b.i.d.   PAST MEDICAL HISTORY:  1. History of probable viral spinal meningitis September 2003.  2. Hypertension.  3. History of myocardial infarction 1993.  4. Heart murmur.  5. Hemorrhoids.  6. Benign prostatic hypertrophy.  7. Urinary incontinence.  8. Arthritis.  9. Restless legs syndrome.   PAST SURGICAL HISTORY:  1. Balloon angioplasty in 1993.  2. Hernia repair in 1994.  3. A TURP in 1996.  4. Reconstruction of the urethra in 1997.  5. Actually had a re-do TURP also.  6. Carpal tunnel release on both hands earlier this year.   SOCIAL HISTORY:  Married, retired, nonsmoker, no alcohol.  Has 2 children.  Wife will be assisting with care after surgery.   FAMILY HISTORY:  Father with a history  of heart disease and colon cancer.  Mother with a history of hypertension and stroke.   REVIEW OF SYSTEMS:  GENERAL:  No fevers, chills, night sweats.  NEUROLOGICAL:  No seizures,  syncope, paralysis.  RESPIRATORY:  No shortness of breath, productive cough,  or hemoptysis.  CARDIOVASCULAR:  No chest pain, angina, orthopnea.  GI:  No  nausea, vomiting, diarrhea, constipation.  GU:  No dysuria, hematuria, or  discharge.  The patient does have some nocturia and frequency with his BPH  and a little bit of incontinence.  MUSCULOSKELETAL:  Pertinent to that of  both knees found in the History of Present Illness.   PHYSICAL EXAMINATION:  VITAL SIGNS:  Pulse 64, respirations 14, blood  pressure 178/98.  GENERAL:  A 77 year old white male, well nourished, well developed.  In no  acute distress.  He is alert, oriented, and cooperative.  HEENT:  Normocephalic, atraumatic.  Pupils round and reactive.  Oropharynx  clear.  EOMs intact.  NECK:  Supple.  No carotid bruits.  CHEST:  Clear anterior and posterior chest walls.  HEART:  Regular rate and rhythm with a pansystolic murmur grade 2 to 3 over  6 heard at aortic, pulmonic and faint at the tricuspid region.  ABDOMEN:  Soft, nontender, bowel sounds are present.  RECTAL, BREASTS, GENITALIA:  Not done and not pertinent to present illness.  EXTREMITIES:  Both knees are examined.  Both knees have a varus deformity  malalignment.  He has range of motion of 5 to 115 degrees on the right and  left knee.  There is no effusion.  There is crepitus noted.   IMPRESSION:  1. Osteoarthritis bilateral knees right more symptomatic than the left.  2. Hypertension.  3. History of myocardial infarction 1993.  4. Status post cardiac catheterization with balloon angioplasty.  5. Heart murmur.  6. History of viral spinal meningitis.  7. Hemorrhoids.  8. Benign prostatic hypertrophy.  9. History of urinary incontinence.  10.      Restless legs syndrome.   PLAN:   The patient will be admitted to [facility] to undergo a  right total knee replacement arthroplasty.  Surgery will be performed by Dr.  Ollen Gross.                                               Alexzandrew L. Julien Girt, P.A.    ALP/MEDQ  D:  06/12/2004  T:  06/12/2004  Job:  [id number]   cc:   Angelena Sole, M.D. [facility]   Maisie Fus D. Riley Kill, M.D. [facility]

## 2011-04-23 ENCOUNTER — Encounter: Payer: Self-pay | Admitting: Internal Medicine

## 2011-04-23 ENCOUNTER — Ambulatory Visit (INDEPENDENT_AMBULATORY_CARE_PROVIDER_SITE_OTHER): Payer: Medicare Other | Admitting: Internal Medicine

## 2011-04-23 DIAGNOSIS — Z950 Presence of cardiac pacemaker: Secondary | ICD-10-CM | POA: Insufficient documentation

## 2011-04-23 DIAGNOSIS — R55 Syncope and collapse: Secondary | ICD-10-CM

## 2011-04-23 DIAGNOSIS — I4891 Unspecified atrial fibrillation: Secondary | ICD-10-CM

## 2011-04-23 DIAGNOSIS — I498 Other specified cardiac arrhythmias: Secondary | ICD-10-CM

## 2011-04-23 DIAGNOSIS — I472 Ventricular tachycardia: Secondary | ICD-10-CM

## 2011-04-23 DIAGNOSIS — I25810 Atherosclerosis of coronary artery bypass graft(s) without angina pectoris: Secondary | ICD-10-CM

## 2011-04-23 NOTE — Assessment & Plan Note (Signed)
No recurrent syncope 

## 2011-04-23 NOTE — Assessment & Plan Note (Signed)
The patient's device was interrogated and the information was fully reviewed.  The device was reprogrammed to shortening AV delay as  he was 100% ventricularly paced at a delay of 350

## 2011-04-23 NOTE — Assessment & Plan Note (Signed)
Stable on current meds 

## 2011-04-23 NOTE — Progress Notes (Signed)
  HPI  [name] is a 77 y.o. male  Seen in followup for pacemaker implantation for tachybradycardia syndrome. He has had a history of nonsustained ventricular tachycardia. He also has a history of coronary disease with prior angioplasty CABG 2007 and a catheterization folowing MI in summer 2011 demonstrating patent grafts.  The patient denies chest pain, shortness of breath, nocturnal dyspnea, orthopnea or peripheral edema.  There have been no palpitations, lightheadedness or syncope.   He had a fall last week in the garden;  He tripped Past Medical History  Diagnosis Date  . A-fib     paroxysmal  . Tachy-brady syndrome   . CAD (coronary artery disease)     MI angioplasty 79, s/p CABG 01/2006- pacemaker  (12-2006)  . CHF (congestive heart failure)   . Hyperlipidemia   . Hypertension   . Spinal stenosis   . GERD (gastroesophageal reflux disease)   . RLS (restless legs syndrome)   . Vitamin B12 deficiency   . Insomnia   . History of BPH     TURP '96 at [facility] 96, urethoplast 97 ( History of bladder outlet obstruction, chronic incontinence)    Past Surgical History  Procedure Date  . Coronary artery bypass graft   . Pacemaker insertion     12-2006 PPM Medtronic  . Turp vaporization   . Inguinal hernia repair   . Replacement total knee     Left  . Carpal tunnel release     Current Outpatient Prescriptions  Medication Sig Dispense Refill  . amiodarone (PACERONE) 200 MG tablet Take 200 mg by mouth daily.        Marland Kitchen amLODipine (NORVASC) 2.5 MG tablet Take 2.5 mg by mouth 2 (two) times daily.        Marland Kitchen aspirin 81 MG tablet Take 81 mg by mouth daily.        Marland Kitchen b complex vitamins tablet Take 1 tablet by mouth daily.        . carvedilol (COREG) 12.5 MG tablet Take 12.5 mg by mouth. 1/2 tablet bid       . clopidogrel (PLAVIX) 75 MG tablet Take 75 mg by mouth daily.        . Multiple Vitamin (MULTIVITAMIN) capsule Take 1 capsule by mouth daily.        Marland Kitchen omeprazole (PRILOSEC) 20 MG  capsule Take 20 mg by mouth daily.        Marland Kitchen DISCONTD: carvedilol (COREG) 12.5 MG tablet TAKE 1/2 TABLET TWICE DALIY WITH MEALS  30 tablet  6    Allergies  Allergen Reactions  . Atorvastatin     REACTION: leg pain    Review of Systems negative except from HPI and PMH  Physical Exam Well developed and well nourished in no acute distress HENT normal E scleral and icterus clear Neck Supple JVP flat; carotids brisk and full Clear to ausculation Regular rate and rhythm, no murmurs gallops or rub Soft with active bowel sounds No clubbing cyanosis and edema Alert and oriented, grossly normal motor and sensory function Skin Warm and Dry with multiple ecchymoses over arms and legs    Assessment and  Plan

## 2011-04-23 NOTE — Assessment & Plan Note (Signed)
No recurrent afib 

## 2011-04-23 NOTE — Patient Instructions (Signed)
Your physician wants you to follow-up in: 1 year. You will receive a reminder letter in the mail two months in advance. If you don't receive a letter, please call our office to schedule the follow-up appointment.  Remote monitoring is used to monitor your Pacemaker of ICD from home. This monitoring reduces the number of office visits required to check your device to one time per year. It allows [place] to keep an eye on the functioning of your device to ensure it is working properly. You are scheduled for a device check from home in 3 months. You may send your transmission at any time that day. If you have a wireless device, the transmission will be sent automatically. After your physician reviews your transmission, you will receive a postcard with your next transmission date.

## 2011-04-23 NOTE — Assessment & Plan Note (Addendum)
No intercurrent atrial fibrillation. He remains on ASA   Not on coumadin  Taking Atrial fibrillation Not on coumadin per Dr Zara Chess  Will review  On amiodarone; surveillance laboratoriesMay 7 were normal

## 2011-05-05 ENCOUNTER — Other Ambulatory Visit: Payer: Self-pay | Admitting: Cardiovascular Disease

## 2011-05-09 ENCOUNTER — Encounter: Payer: Self-pay | Admitting: Internal Medicine

## 2011-05-10 ENCOUNTER — Encounter: Payer: Self-pay | Admitting: Internal Medicine

## 2011-05-10 ENCOUNTER — Ambulatory Visit (INDEPENDENT_AMBULATORY_CARE_PROVIDER_SITE_OTHER): Payer: Medicare Other | Admitting: Internal Medicine

## 2011-05-10 DIAGNOSIS — E785 Hyperlipidemia, unspecified: Secondary | ICD-10-CM

## 2011-05-10 DIAGNOSIS — K625 Hemorrhage of anus and rectum: Secondary | ICD-10-CM

## 2011-05-10 DIAGNOSIS — Z0000 Encounter for general adult medical examination without abnormal findings: Secondary | ICD-10-CM | POA: Insufficient documentation

## 2011-05-10 DIAGNOSIS — H9190 Unspecified hearing loss, unspecified ear: Secondary | ICD-10-CM

## 2011-05-10 DIAGNOSIS — N400 Enlarged prostate without lower urinary tract symptoms: Secondary | ICD-10-CM

## 2011-05-10 NOTE — Assessment & Plan Note (Signed)
Intolerant to statins. 

## 2011-05-10 NOTE — Assessment & Plan Note (Addendum)
Td 09 and 05-2010 pneumonia shot 06 and 05-2010 Discussed shingles  Shot Refer to audiology for eval  colonoscopies in the past, 1999 and 2003; was referred for a colonoscopy in 2011 due to rectal bleeding however he had a MI and was unable to pursue another colonoscopy. At this point, we both agreed to stop colon cancer screening due to his age and comorbidities. We also discontinue prostate cancer screenings for the same reason. Diet and exercise discussed All recent labs reviewed, creatinine was slightly elevated and hemoglobin is slightly decreased last month. Re-assess on return to the office

## 2011-05-10 NOTE — Assessment & Plan Note (Signed)
He had a lot of rectal bleeding from hemorrhoids last year,  this year symptoms are much decreased. Recommend observation, will need further intervention if symptoms resurface

## 2011-05-10 NOTE — Progress Notes (Signed)
Subjective:    Patient ID: [name], male    DOB: 05/13/1921, 77 y.o.   MRN: [id number]  HPI Here for Medicare AWV:  1. Risk factors based on Past M, S, F history: reviewed 2. Physical Activities:  Active--home, yard work, [facility] the [facility] 2-3 [facility], still plays golf sometimes  3. Depression/mood:  No problems reported or noted  4. Hearing:  Has difficulty w/ hearing, never evaluated before, will refer him 5. ADL's:  Independent  6. Fall Risk: had a fall 3 weeks ago (mechanical), recovering well, no major injuries ; denies problems w/ gait 7. home Safety: does feel safe at home  8. Height, weight, &visual acuity: see VS, vision is poor d/t cataracts, reluctant to see ophthalmologist, declined referral but states will try to set up an appointment w/ his wife's eye doctor 9. Counseling: provided 10. Labs ordered based on risk factors: if needed  11. Referral Coordination: if needed 12.  Care Plan, see assessment and plan  13.   Cognitive Assessment: normal / appropriate for age  motor and cognition  In addition, today we discussed the following: HTN-- amb BP ~ 130/80, good med compliance  CAD-- notes from cards reviewed, stable  Hyperlipidemia-- on no meds , statins intolerant  Past Medical History  Diagnosis Date  . A-fib     paroxysmal, was on coumadin , pt desired to stop it , no longer a candidate per cards  . Tachy-brady syndrome   . CAD (coronary artery disease)     MI angioplasty 71, s/p CABG 01/2006- pacemaker  (12-2006), NSTMI  7-11  . CHF (congestive heart failure)   . Hyperlipidemia   . Hypertension   . Spinal stenosis      has seen Dr Ethelene Hal before, s/p local injection which helped x a while   . GERD (gastroesophageal reflux disease)   . RLS (restless legs syndrome)     ? of  . Vitamin B12 deficiency   . Insomnia   . History of BPH     TURP '96 at [facility] 96, urethoplast 97 ( History of bladder outlet obstruction, chronic incontinence)    Past Surgical History  Procedure Date  . Coronary artery bypass graft   . Pacemaker insertion     12-2006 PPM Medtronic  . Turp vaporization     x 2  . Inguinal hernia repair     Right  . Replacement total knee 2006    Left  . Carpal tunnel release     bilateral     Social History:  The patient is a retired Surveyor, minerals.  He is married  2 children Quit tobacco 1960 No alcohol.  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  FH prostate ca--no Colon ca-- F dx in his late 44s   Review of Systems  Constitutional: Negative for fever and unexpected weight change.  Respiratory: Negative for cough and shortness of breath.   Cardiovascular: Negative for chest pain. Leg swelling: occ R>L leg swelling.  Gastrointestinal: Negative for nausea, vomiting and abdominal pain. Rectal pain: very little rectal bleed from time to time   Genitourinary: Negative for dysuria, hematuria and difficulty urinating.       Chronic on-off leak since TURP years ago       Objective:   Physical Exam  Constitutional: He is oriented to person, place, and time. He appears well-developed and well-nourished. No distress.  HENT:  Head: Normocephalic and atraumatic.  Neck:       Normal carotid pulses bilaterally  Cardiovascular: Normal rate and regular rhythm.   No murmur heard. Pulmonary/Chest: Effort normal and breath sounds normal. No respiratory distress. He has no wheezes. He has no rales.  Abdominal: Soft. Bowel sounds are normal. He exhibits no distension. There is no tenderness. There is no rebound and no guarding.  Musculoskeletal:        bilateral lower extremities w/ trace edema   Neurological: He is alert and oriented to person, place, and time.  Skin: He is not diaphoretic.  Psychiatric: He has a normal mood and affect. His behavior is normal. Judgment and thought content normal.          Assessment & Plan:  Today , I spent more than 15 min with the patient in addition to  his CPX , >50% of the time counseling, and  reviewing the chart and labs ordered by other providers

## 2011-05-10 NOTE — Assessment & Plan Note (Signed)
Essentially asx, we decided no further prostate ca screening thus no DRE performed.

## 2011-06-05 ENCOUNTER — Telehealth: Payer: Self-pay | Admitting: Cardiology

## 2011-06-05 DIAGNOSIS — I4891 Unspecified atrial fibrillation: Secondary | ICD-10-CM

## 2011-06-05 DIAGNOSIS — R5383 Other fatigue: Secondary | ICD-10-CM

## 2011-06-05 NOTE — Telephone Encounter (Signed)
Will need to talk to TS about this.  i am not sure why he is blaming the amiodarone at this point  He has been on it for some time

## 2011-06-05 NOTE — Telephone Encounter (Signed)
Per pt the medication he is on is making him so weak he almost he can't get around and he wants to know what he can do about it

## 2011-06-05 NOTE — Telephone Encounter (Signed)
Patient states that he is very fatigued on Amiodarone. BP this morning before medications was 144/85 and after medications is 117/76. He states that when his BP drops to the mid-teens, he becomes really lethargic. Advised him that I would route this to Dr. Graciela Husbands to review since he is following his afib.

## 2011-06-07 ENCOUNTER — Telehealth: Payer: Self-pay | Admitting: Internal Medicine

## 2011-06-07 MED ORDER — AMIODARONE HCL 200 MG PO TABS: ORAL_TABLET | ORAL | Status: DC

## 2011-06-07 NOTE — Telephone Encounter (Signed)
Per Dr. Riley Kill, will have patient cut Amiodarone in half and come in for a BMP, LIVER, CBC, and TSH next week with a f/u appointment. He is having eye surgery next Wednesday so will call us next week to set up a f/u appointment.

## 2011-06-07 NOTE — Telephone Encounter (Signed)
All Cardiac faxed to [facility]od[facility] @ [phone]  06/07/11/km

## 2011-06-11 ENCOUNTER — Other Ambulatory Visit (INDEPENDENT_AMBULATORY_CARE_PROVIDER_SITE_OTHER): Payer: Medicare Other | Admitting: *Deleted

## 2011-06-11 ENCOUNTER — Other Ambulatory Visit: Payer: Self-pay | Admitting: *Deleted

## 2011-06-11 ENCOUNTER — Telehealth: Payer: Self-pay | Admitting: *Deleted

## 2011-06-11 DIAGNOSIS — I4891 Unspecified atrial fibrillation: Secondary | ICD-10-CM

## 2011-06-11 DIAGNOSIS — R5383 Other fatigue: Secondary | ICD-10-CM

## 2011-06-11 DIAGNOSIS — I10 Essential (primary) hypertension: Secondary | ICD-10-CM

## 2011-06-11 DIAGNOSIS — I495 Sick sinus syndrome: Secondary | ICD-10-CM

## 2011-06-11 DIAGNOSIS — E785 Hyperlipidemia, unspecified: Secondary | ICD-10-CM

## 2011-06-11 DIAGNOSIS — I25810 Atherosclerosis of coronary artery bypass graft(s) without angina pectoris: Secondary | ICD-10-CM

## 2011-06-11 DIAGNOSIS — I472 Ventricular tachycardia: Secondary | ICD-10-CM

## 2011-06-11 NOTE — Telephone Encounter (Signed)
Pt here for labs today. Appt not in computer, but labs are. I will make the appt.

## 2011-06-12 LAB — CBC WITH DIFFERENTIAL/PLATELET
Eosinophils Absolute: 0.3 10*3/uL (ref 0.0–0.7)
Eosinophils Relative: 3.8 % (ref 0.0–5.0)
HCT: 33.8 % — ABNORMAL LOW (ref 39.0–52.0)
Lymphs Abs: 1 10*3/uL (ref 0.7–4.0)
MCHC: 34.2 g/dL (ref 30.0–36.0)
MCV: 92.4 fl (ref 78.0–100.0)
Monocytes Absolute: 0.6 10*3/uL (ref 0.1–1.0)
Neutrophils Relative %: 72.7 % (ref 43.0–77.0)
Platelets: 220 10*3/uL (ref 150.0–400.0)
RDW: 14.6 % (ref 11.5–14.6)

## 2011-06-12 LAB — HEPATIC FUNCTION PANEL
ALT: 33 U/L (ref 0–53)
Bilirubin, Direct: 0.2 mg/dL (ref 0.0–0.3)
Total Bilirubin: 0.7 mg/dL (ref 0.3–1.2)

## 2011-06-12 LAB — BASIC METABOLIC PANEL
BUN: 24 mg/dL — ABNORMAL HIGH (ref 6–23)
CO2: 26 mEq/L (ref 19–32)
Chloride: 110 mEq/L (ref 96–112)
Creatinine, Ser: 1.3 mg/dL (ref 0.4–1.5)
Glucose, Bld: 108 mg/dL — ABNORMAL HIGH (ref 70–99)
Potassium: 4.3 mEq/L (ref 3.5–5.1)

## 2011-06-12 LAB — TSH: TSH: 2.12 u[IU]/mL (ref 0.35–5.50)

## 2011-07-26 ENCOUNTER — Ambulatory Visit (INDEPENDENT_AMBULATORY_CARE_PROVIDER_SITE_OTHER): Payer: Medicare Other | Admitting: *Deleted

## 2011-07-26 ENCOUNTER — Other Ambulatory Visit: Payer: Self-pay | Admitting: Internal Medicine

## 2011-07-26 DIAGNOSIS — I495 Sick sinus syndrome: Secondary | ICD-10-CM

## 2011-07-27 LAB — REMOTE PACEMAKER DEVICE
AL IMPEDENCE PM: 413 Ohm
AL THRESHOLD: 0.625 V
BAMS-0001: 175 {beats}/min
BATTERY VOLTAGE: 2.76 V
RV LEAD THRESHOLD: 0.875 V

## 2011-08-01 ENCOUNTER — Encounter: Payer: Self-pay | Admitting: *Deleted

## 2011-08-05 ENCOUNTER — Other Ambulatory Visit: Payer: Self-pay | Admitting: Cardiology

## 2011-08-09 ENCOUNTER — Other Ambulatory Visit: Payer: Self-pay | Admitting: Cardiovascular Disease

## 2011-08-13 NOTE — Progress Notes (Signed)
Pacer checked remotely. 

## 2011-09-14 ENCOUNTER — Ambulatory Visit (INDEPENDENT_AMBULATORY_CARE_PROVIDER_SITE_OTHER): Payer: Medicare Other | Admitting: Cardiology

## 2011-09-14 ENCOUNTER — Ambulatory Visit (INDEPENDENT_AMBULATORY_CARE_PROVIDER_SITE_OTHER)
Admission: RE | Admit: 2011-09-14 | Discharge: 2011-09-14 | Disposition: A | Discharge status: Home / Self Care | Payer: Medicare Other | Source: Ambulatory Visit | Attending: Cardiology | Admitting: Cardiology

## 2011-09-14 ENCOUNTER — Encounter: Payer: Self-pay | Admitting: Cardiology

## 2011-09-14 DIAGNOSIS — I25810 Atherosclerosis of coronary artery bypass graft(s) without angina pectoris: Secondary | ICD-10-CM

## 2011-09-14 DIAGNOSIS — I4891 Unspecified atrial fibrillation: Secondary | ICD-10-CM

## 2011-09-14 DIAGNOSIS — I495 Sick sinus syndrome: Secondary | ICD-10-CM

## 2011-09-14 NOTE — Assessment & Plan Note (Addendum)
Currently without chest pain.  Continue medications. See cath report July 2011 in overview.

## 2011-09-14 NOTE — Assessment & Plan Note (Signed)
Currently paced and arrythmia without recurrence.  Tolerating meds.  TSH and LFTs done in July.  Will recheck CXR.  No other issues.  Continue current meds.

## 2011-09-14 NOTE — Assessment & Plan Note (Signed)
No obvious recurrence.  Continue current meds.

## 2011-09-14 NOTE — Patient Instructions (Signed)
Your physician wants you to follow-up in: 6 MONTHS.  You will receive a reminder letter in the mail two months in advance. If you don't receive a letter, please call our office to schedule the follow-up appointment.  Your physician recommends that you continue on your current medications as directed. Please refer to the Current Medication list given to you today.  Please have a chest x-ray performed at the [facility] office on [facility].

## 2011-09-14 NOTE — Progress Notes (Signed)
HPI:  [name] is doing well.  He continues to get along well.  He cuts his own grass.  He is still active at present.  Denies cough, or shortness of breath.  His weight has ben stable.  He has no other symptoms.  Feels good.  No chest pain. Labs done in July of this year.    Current Outpatient Prescriptions  Medication Sig Dispense Refill  . amiodarone (PACERONE) 100 MG tablet Take by mouth. Take 1/2 tablet daily       . amLODipine (NORVASC) 2.5 MG tablet TAKE 1 TABLET BY MOUTH TWICE A DAY  60 tablet  6  . aspirin 81 MG tablet Take 81 mg by mouth daily.        Marland Kitchen b complex vitamins tablet Take 1 tablet by mouth daily.        . carvedilol (COREG) 6.25 MG tablet Take 6.25 mg by mouth 2 (two) times daily with a meal.        . clopidogrel (PLAVIX) 75 MG tablet TAKE 1 TABLET BY MOUTH DAILY WITH A MEAL  30 tablet  2    Allergies  Allergen Reactions  . Atorvastatin     REACTION: leg pain    Past Medical History  Diagnosis Date  . A-fib     paroxysmal, was on coumadin , pt desired to stop it , no longer a candidate per cards  . Tachy-brady syndrome   . CAD (coronary artery disease)     MI angioplasty 67, s/p CABG 01/2006- pacemaker  (12-2006), NSTMI  7-11  . CHF (congestive heart failure)   . Hyperlipidemia   . Hypertension   . Spinal stenosis      has seen Dr Ethelene Hal before, s/p local injection which helped x a while   . GERD (gastroesophageal reflux disease)   . RLS (restless legs syndrome)     ? of  . Vitamin B12 deficiency   . Insomnia   . History of BPH     TURP '96 at [facility] 96, urethoplast 97 ( History of bladder outlet obstruction, chronic incontinence)    Past Surgical History  Procedure Date  . Coronary artery bypass graft   . Pacemaker insertion     12-2006 PPM Medtronic  . Turp vaporization     x 2  . Inguinal hernia repair     Right  . Replacement total knee 2006    Left  . Carpal tunnel release     bilateral    Family History  Problem Relation Age of Onset  .  Heart disease Neg Hx   . Diabetes Neg Hx     History   Social History  . Marital Status: Married    Spouse Name: N/A    Number of Children: N/A  . Years of Education: N/A   Occupational History  . Not on file.   Social History Main Topics  . Smoking status: Former Games developer  . Smokeless tobacco: Not on file  . Alcohol Use: No  . Drug Use:   . Sexually Active:    Other Topics Concern  . Not on file   Social History Narrative  . No narrative on file    ROS: Please see the HPI.  All other systems reviewed and negative.  PHYSICAL EXAM:  BP 110/70  Pulse 62  Resp 18  Ht 5\' 9"  (1.753 m)  Wt 199 lb (90.266 kg)  BMI 29.39 kg/m2  General: Well developed, well nourished, in no acute  distress. Head:  Normocephalic and atraumatic. Neck: no JVD Lungs: Clear to auscultation and percussion. Heart: Normal S1 and S2. Systolic ejection murmur, 2/6.  No DM.  PMI non displaced.  Abdomen:  Normal bowel sounds; soft; non tender; no organomegaly Pulses: Pulses normal in all 4 extremities. Extremities: No clubbing or cyanosis. No edema. Neurologic: Alert and oriented x 3.  EKG:  Atrial tracking with v pacing, and av pacing.     ASSESSMENT AND PLAN:

## 2011-09-25 ENCOUNTER — Ambulatory Visit (INDEPENDENT_AMBULATORY_CARE_PROVIDER_SITE_OTHER): Payer: Medicare Other

## 2011-09-25 DIAGNOSIS — Z23 Encounter for immunization: Secondary | ICD-10-CM

## 2011-11-01 ENCOUNTER — Encounter: Payer: Medicare Other | Admitting: *Deleted

## 2011-11-06 ENCOUNTER — Encounter: Payer: Self-pay | Admitting: *Deleted

## 2011-11-15 ENCOUNTER — Telehealth: Payer: Self-pay | Admitting: Cardiovascular Disease

## 2011-11-15 NOTE — Telephone Encounter (Signed)
Fu call Pt wanted to know if rx for carvedilol was called into [facility] on [address]

## 2011-11-21 ENCOUNTER — Other Ambulatory Visit: Payer: Self-pay | Admitting: Cardiology

## 2012-01-23 ENCOUNTER — Encounter: Payer: Self-pay | Admitting: Internal Medicine

## 2012-01-23 ENCOUNTER — Ambulatory Visit (INDEPENDENT_AMBULATORY_CARE_PROVIDER_SITE_OTHER): Payer: Medicare Other | Admitting: *Deleted

## 2012-01-23 DIAGNOSIS — Z950 Presence of cardiac pacemaker: Secondary | ICD-10-CM

## 2012-01-23 DIAGNOSIS — I4891 Unspecified atrial fibrillation: Secondary | ICD-10-CM

## 2012-01-23 DIAGNOSIS — I495 Sick sinus syndrome: Secondary | ICD-10-CM

## 2012-01-23 DIAGNOSIS — I472 Ventricular tachycardia: Secondary | ICD-10-CM

## 2012-01-25 LAB — REMOTE PACEMAKER DEVICE
ATRIAL PACING PM: 99
VENTRICULAR PACING PM: 100

## 2012-01-30 ENCOUNTER — Encounter: Payer: Self-pay | Admitting: *Deleted

## 2012-01-31 NOTE — Progress Notes (Signed)
Remote pacer check  

## 2012-02-01 ENCOUNTER — Other Ambulatory Visit: Payer: Self-pay | Admitting: Cardiovascular Disease

## 2012-02-05 ENCOUNTER — Other Ambulatory Visit: Payer: Self-pay | Admitting: Cardiology

## 2012-02-15 ENCOUNTER — Encounter: Payer: Self-pay | Admitting: Internal Medicine

## 2012-03-11 ENCOUNTER — Other Ambulatory Visit: Payer: Self-pay | Admitting: Cardiology

## 2012-03-13 ENCOUNTER — Ambulatory Visit: Payer: Medicare Other | Admitting: Cardiology

## 2012-03-18 ENCOUNTER — Ambulatory Visit: Payer: Medicare Other | Admitting: Cardiology

## 2012-03-25 ENCOUNTER — Encounter: Payer: Self-pay | Admitting: Cardiology

## 2012-03-25 ENCOUNTER — Ambulatory Visit (INDEPENDENT_AMBULATORY_CARE_PROVIDER_SITE_OTHER): Payer: Medicare Other | Admitting: Cardiology

## 2012-03-25 VITALS — BP 138/82 | HR 60 | Ht 70.0 in | Wt 198.1 lb

## 2012-03-25 DIAGNOSIS — I4891 Unspecified atrial fibrillation: Secondary | ICD-10-CM

## 2012-03-25 DIAGNOSIS — I10 Essential (primary) hypertension: Secondary | ICD-10-CM

## 2012-03-25 DIAGNOSIS — I2510 Atherosclerotic heart disease of native coronary artery without angina pectoris: Secondary | ICD-10-CM

## 2012-03-25 DIAGNOSIS — D649 Anemia, unspecified: Secondary | ICD-10-CM

## 2012-03-25 DIAGNOSIS — I25810 Atherosclerosis of coronary artery bypass graft(s) without angina pectoris: Secondary | ICD-10-CM

## 2012-03-25 DIAGNOSIS — Z950 Presence of cardiac pacemaker: Secondary | ICD-10-CM

## 2012-03-25 DIAGNOSIS — E785 Hyperlipidemia, unspecified: Secondary | ICD-10-CM

## 2012-03-25 NOTE — Progress Notes (Signed)
HPI:  He is doing pretty well overall, but does complain of both some pain in the legs, but mostly weakness, especially when he is trying to get out of a chair.   Current Outpatient Prescriptions  Medication Sig Dispense Refill  . amiodarone (PACERONE) 200 MG tablet Take 0.5 tablets (100 mg total) by mouth daily.  30 tablet  6  . amLODipine (NORVASC) 2.5 MG tablet TAKE 1 TABLET BY MOUTH TWICE A DAY  60 tablet  6  . aspirin 81 MG tablet Take 81 mg by mouth daily.        Marland Kitchen b complex vitamins tablet Take 1 tablet by mouth daily.        . clopidogrel (PLAVIX) 75 MG tablet TAKE 1 TABLET BY MOUTH DAILY WITH A MEAL  30 tablet  10    Allergies  Allergen Reactions  . Atorvastatin     REACTION: leg pain    Past Medical History  Diagnosis Date  . A-fib     paroxysmal, was on coumadin , pt desired to stop it , no longer a candidate per cards  . Tachy-brady syndrome   . CAD (coronary artery disease)     MI angioplasty 70, s/p CABG 01/2006- pacemaker  (12-2006), NSTMI  7-11  . CHF (congestive heart failure)   . Hyperlipidemia   . Hypertension   . Spinal stenosis      has seen Dr Ethelene Hal before, s/p local injection which helped x a while   . GERD (gastroesophageal reflux disease)   . RLS (restless legs syndrome)     ? of  . Vitamin B12 deficiency   . Insomnia   . History of BPH     TURP '96 at [facility] 96, urethoplast 97 ( History of bladder outlet obstruction, chronic incontinence)    Past Surgical History  Procedure Date  . Coronary artery bypass graft   . Pacemaker insertion     12-2006 PPM Medtronic  . Turp vaporization     x 2  . Inguinal hernia repair     Right  . Replacement total knee 2006    Left  . Carpal tunnel release     bilateral    Family History  Problem Relation Age of Onset  . Heart disease Neg Hx   . Diabetes Neg Hx     History   Social History  . Marital Status: Married    Spouse Name: N/A    Number of Children: N/A  . Years of Education: N/A    Occupational History  . Not on file.   Social History Main Topics  . Smoking status: Former Games developer  . Smokeless tobacco: Not on file  . Alcohol Use: No  . Drug Use:   . Sexually Active:    Other Topics Concern  . Not on file   Social History Narrative  . No narrative on file    ROS: Please see the HPI.  All other systems reviewed and negative.  PHYSICAL EXAM:  BP 138/82  Pulse 60  Ht 5\' 10"  (1.778 m)  Wt 198 lb 1.9 oz (89.867 kg)  BMI 28.43 kg/m2  General: Well developed, well nourished, in no acute distress. Head:  Normocephalic and atraumatic. Neck: no JVD Lungs: Clear to auscultation and percussion. Chest:  Pacer site looks good.   Heart: Normal S1 and S2.  Minimal SEM.  No DM.   Abdomen:  Normal bowel sounds; soft; non tender; no organomegaly Pulses: Pulses normal in all 4  extremities. Extremities: No clubbing or cyanosis. No edema. Neurologic: Alert and oriented x 3.  EKG:  Atrioventricular pacing.   ASSESSMENT AND PLAN:

## 2012-03-25 NOTE — Patient Instructions (Signed)
Your physician has recommended you make the following change in your medication: STOP Amiodarone  Your physician recommends that you schedule a follow-up appointment in: 6 WEEKS with Dr Riley Kill  Your physician recommends that you have lab work today: CBC, BMP, LIVER, TSH, CPK

## 2012-03-26 LAB — CBC WITH DIFFERENTIAL/PLATELET
Basophils Relative: 0.7 % (ref 0.0–3.0)
Eosinophils Absolute: 0.3 10*3/uL (ref 0.0–0.7)
MCHC: 33.8 g/dL (ref 30.0–36.0)
MCV: 91.9 fl (ref 78.0–100.0)
Monocytes Absolute: 0.7 10*3/uL (ref 0.1–1.0)
Neutrophils Relative %: 72.8 % (ref 43.0–77.0)
Platelets: 199 10*3/uL (ref 150.0–400.0)
RDW: 14.3 % (ref 11.5–14.6)

## 2012-03-26 LAB — BASIC METABOLIC PANEL
BUN: 21 mg/dL (ref 6–23)
GFR: 55.33 mL/min — ABNORMAL LOW (ref >60.00)
Potassium: 4.4 mEq/L (ref 3.5–5.1)
Sodium: 141 mEq/L (ref 135–145)

## 2012-03-26 LAB — HEPATIC FUNCTION PANEL
AST: 17 U/L (ref 0–37)
Total Bilirubin: 0.6 mg/dL (ref 0.3–1.2)

## 2012-03-26 LAB — TSH: TSH: 2.15 u[IU]/mL (ref 0.35–5.50)

## 2012-03-30 DIAGNOSIS — I2510 Atherosclerotic heart disease of native coronary artery without angina pectoris: Secondary | ICD-10-CM | POA: Insufficient documentation

## 2012-03-30 NOTE — Assessment & Plan Note (Signed)
Has device follow up.  TS

## 2012-03-30 NOTE — Assessment & Plan Note (Addendum)
No obvious clinical recurrence.  However, he has some muscle pain and some weakness in the legs.  Will check a CPK.  He has trouble getting out of a chair which seems fairly classic for an amiodarone issue, even though the dose is very low. I will stop his Amio, and check labs that include CPK.  Early follow up in six weeks is recommended.  The patient understands and would like to stop amio, but as I reminded him, it is possible that he might have AF recurrence.  He is aware.

## 2012-03-30 NOTE — Assessment & Plan Note (Signed)
Controlled on the current regimen.

## 2012-03-30 NOTE — Assessment & Plan Note (Addendum)
No current symptoms.  Cath report reviewed.  Patent grafts.  OM disease.  See 2011 cath report for more details.  Review time fifteen minutes finding and reviewing cath lab data.

## 2012-03-30 NOTE — Assessment & Plan Note (Signed)
Check labs since he is on dual antiplatelet therapy.

## 2012-03-30 NOTE — Assessment & Plan Note (Signed)
Does not take statins.

## 2012-04-24 ENCOUNTER — Encounter: Payer: Self-pay | Admitting: Internal Medicine

## 2012-04-24 ENCOUNTER — Ambulatory Visit (INDEPENDENT_AMBULATORY_CARE_PROVIDER_SITE_OTHER): Payer: Medicare Other | Admitting: *Deleted

## 2012-04-24 ENCOUNTER — Ambulatory Visit (INDEPENDENT_AMBULATORY_CARE_PROVIDER_SITE_OTHER): Payer: Medicare Other | Admitting: Internal Medicine

## 2012-04-24 VITALS — BP 131/76 | HR 60 | Ht 69.0 in | Wt 199.8 lb

## 2012-04-24 DIAGNOSIS — R55 Syncope and collapse: Secondary | ICD-10-CM

## 2012-04-24 DIAGNOSIS — I4891 Unspecified atrial fibrillation: Secondary | ICD-10-CM

## 2012-04-24 DIAGNOSIS — IMO0001 Reserved for inherently not codable concepts without codable children: Secondary | ICD-10-CM

## 2012-04-24 DIAGNOSIS — I10 Essential (primary) hypertension: Secondary | ICD-10-CM

## 2012-04-24 DIAGNOSIS — E785 Hyperlipidemia, unspecified: Secondary | ICD-10-CM

## 2012-04-24 DIAGNOSIS — Z950 Presence of cardiac pacemaker: Secondary | ICD-10-CM

## 2012-04-24 DIAGNOSIS — I498 Other specified cardiac arrhythmias: Secondary | ICD-10-CM

## 2012-04-24 DIAGNOSIS — I495 Sick sinus syndrome: Secondary | ICD-10-CM | POA: Insufficient documentation

## 2012-04-24 DIAGNOSIS — M791 Myalgia, unspecified site: Secondary | ICD-10-CM

## 2012-04-24 LAB — CK: Total CK: 359 U/L — ABNORMAL HIGH (ref 7–232)

## 2012-04-24 NOTE — Assessment & Plan Note (Signed)
The patient's device was interrogated.  The information was reviewed. No changes were made in the programming.    

## 2012-04-24 NOTE — Assessment & Plan Note (Signed)
No intercurrent Ventricular tachycardia  

## 2012-04-24 NOTE — Assessment & Plan Note (Signed)
Patient has elevated CK mildly and myalgias. I also worry about polymyalgia rheumatica. We will check a sedimentation rate today. His wife does not think he is any better since discontinuing the amiodarone.

## 2012-04-24 NOTE — Patient Instructions (Signed)
Your physician recommends that you have lab work today: Sed rate  Remote monitoring is used to monitor your Pacemaker of ICD from home. This monitoring reduces the number of office visits required to check your device to one time per year. It allows [place] to keep an eye on the functioning of your device to ensure it is working properly. You are scheduled for a device check from home on 07/31/12. You may send your transmission at any time that day. If you have a wireless device, the transmission will be sent automatically. After your physician reviews your transmission, you will receive a postcard with your next transmission date.   Your physician wants you to follow-up in: 1 year with Dr. Graciela Husbands. You will receive a reminder letter in the mail two months in advance. If you don't receive a letter, please call our office to schedule the follow-up appointment.

## 2012-04-24 NOTE — Progress Notes (Signed)
  HPI  [name] is a 78 y.o. male  Seen in followup for pacemaker implantation for tachybradycardia syndrome. He has had a history of nonsustained ventricular tachycardia. He also has a history of coronary disease with prior angioplasty CABG 2007 and a catheterization folowing MI in summer 2011 demonstrating patent grafts.    When he saw Dr. Riley Kill he was complaining of weakness in his legs which he thought might be attributable to amiodarone. It was stopped. The patient is not on statins. CPK measurements were honestly elevated and repeat is pending today  The complaint is largely of discomfort and pain. He does not have discomfort in his shoulders appear   Past Medical History  Diagnosis Date  . A-fib     paroxysmal, was on coumadin , pt desired to stop it , no longer a candidate per cards  . Tachy-brady syndrome   . CAD (coronary artery disease)     MI angioplasty 5, s/p CABG 01/2006- pacemaker  (12-2006), NSTMI  7-11  . CHF (congestive heart failure)   . Hyperlipidemia   . Hypertension   . Spinal stenosis      has seen Dr Ethelene Hal before, s/p local injection which helped x a while   . GERD (gastroesophageal reflux disease)   . RLS (restless legs syndrome)     ? of  . Vitamin B12 deficiency   . Insomnia   . History of BPH     TURP '96 at [facility] 96, urethoplast 97 ( History of bladder outlet obstruction, chronic incontinence)    Past Surgical History  Procedure Date  . Coronary artery bypass graft   . Pacemaker insertion     12-2006 PPM Medtronic  . Turp vaporization     x 2  . Inguinal hernia repair     Right  . Replacement total knee 2006    Left  . Carpal tunnel release     bilateral    Current Outpatient Prescriptions  Medication Sig Dispense Refill  . amLODipine (NORVASC) 2.5 MG tablet TAKE 1 TABLET BY MOUTH TWICE A DAY  60 tablet  6  . aspirin 81 MG tablet Take 81 mg by mouth daily.        Marland Kitchen b complex vitamins tablet Take 1 tablet by mouth daily.        .  carvedilol (COREG) 6.25 MG tablet Take 6.25 mg by mouth 2 (two) times daily with a meal.      . clopidogrel (PLAVIX) 75 MG tablet TAKE 1 TABLET BY MOUTH DAILY WITH A MEAL  30 tablet  10    Allergies  Allergen Reactions  . Atorvastatin     REACTION: leg pain    Review of Systems negative except from HPI and PMH  Physical Exam BP 131/76  Pulse 60  Ht 5\' 9"  (1.753 m)  Wt 199 lb 12.8 oz (90.629 kg)  BMI 29.51 kg/m2 Well developed and well nourished in no acute distress HENT normal E scleral and icterus clear Neck Supple JVP flat; carotids brisk and full Clear to ausculation Device pocket well-healed Regular rate and rhythm, no murmurs gallops or rub Soft with active bowel sounds No clubbing cyanosis none Edema Alert and oriented, grossly normal motor and sensory function Skin Warm and Dry    Assessment and  Plan

## 2012-04-24 NOTE — Assessment & Plan Note (Signed)
Stable on pacing with good heart response

## 2012-04-24 NOTE — Assessment & Plan Note (Signed)
No af  amiodareon was stoppped Not on Coumadin per his preference. It may be now that with the apixoban Trial comparing aspirin showing comparable bleeding risks the superior benefit be willing to take this. I will defer these discussions Dr. Riley Kill.

## 2012-04-24 NOTE — Assessment & Plan Note (Signed)
No revurrent syncope

## 2012-04-28 LAB — PACEMAKER DEVICE OBSERVATION

## 2012-05-09 ENCOUNTER — Other Ambulatory Visit: Payer: Self-pay | Admitting: *Deleted

## 2012-05-09 DIAGNOSIS — IMO0001 Reserved for inherently not codable concepts without codable children: Secondary | ICD-10-CM

## 2012-05-12 ENCOUNTER — Encounter: Payer: Self-pay | Admitting: Cardiology

## 2012-05-12 ENCOUNTER — Ambulatory Visit (INDEPENDENT_AMBULATORY_CARE_PROVIDER_SITE_OTHER): Payer: Medicare Other | Admitting: Cardiology

## 2012-05-12 VITALS — BP 136/88 | HR 60 | Ht 69.0 in | Wt 197.8 lb

## 2012-05-12 DIAGNOSIS — IMO0001 Reserved for inherently not codable concepts without codable children: Secondary | ICD-10-CM

## 2012-05-12 DIAGNOSIS — I2510 Atherosclerotic heart disease of native coronary artery without angina pectoris: Secondary | ICD-10-CM

## 2012-05-12 DIAGNOSIS — M791 Myalgia, unspecified site: Secondary | ICD-10-CM

## 2012-05-12 DIAGNOSIS — I10 Essential (primary) hypertension: Secondary | ICD-10-CM

## 2012-05-12 DIAGNOSIS — I4891 Unspecified atrial fibrillation: Secondary | ICD-10-CM

## 2012-05-12 NOTE — Patient Instructions (Signed)
Your physician recommends that you schedule a follow-up appointment in: 2 MONTHS  

## 2012-05-13 LAB — CK: Total CK: 303 U/L — ABNORMAL HIGH (ref 7–232)

## 2012-05-28 NOTE — Progress Notes (Signed)
HPI:   The patient is in for follow up.  He is stable from a cardiac standpoint, but his legs bother him.  When he bends over, legs hurt.  He also has some muscle weakness relative to standing up, and CPK is elevated, but amiodarone has been on hold at present.  He has had mild elevation of his sed rate.    Current Outpatient Prescriptions  Medication Sig Dispense Refill  . amLODipine (NORVASC) 2.5 MG tablet TAKE 1 TABLET BY MOUTH TWICE A DAY  60 tablet  6  . aspirin 81 MG tablet Take 81 mg by mouth daily.        Marland Kitchen b complex vitamins tablet Take 1 tablet by mouth daily.        . carvedilol (COREG) 6.25 MG tablet Take 6.25 mg by mouth 2 (two) times daily with a meal.      . clopidogrel (PLAVIX) 75 MG tablet TAKE 1 TABLET BY MOUTH DAILY WITH A MEAL  30 tablet  10    Allergies  Allergen Reactions  . Atorvastatin     REACTION: leg pain    Past Medical History  Diagnosis Date  . A-fib     paroxysmal, was on coumadin , pt desired to stop it , no longer a candidate per cards  . Tachy-brady syndrome   . CAD (coronary artery disease)     MI angioplasty 67, s/p CABG 01/2006- pacemaker  (12-2006), NSTMI  7-11  . CHF (congestive heart failure)   . Hyperlipidemia   . Hypertension   . Spinal stenosis      has seen Dr Ethelene Hal before, s/p local injection which helped x a while   . GERD (gastroesophageal reflux disease)   . RLS (restless legs syndrome)     ? of  . Vitamin B12 deficiency   . Insomnia   . History of BPH     TURP '96 at [facility] 96, urethoplast 97 ( History of bladder outlet obstruction, chronic incontinence)    Past Surgical History  Procedure Date  . Coronary artery bypass graft   . Pacemaker insertion     12-2006 PPM Medtronic  . Turp vaporization     x 2  . Inguinal hernia repair     Right  . Replacement total knee 2006    Left  . Carpal tunnel release     bilateral    Family History  Problem Relation Age of Onset  . Heart disease Neg Hx   . Diabetes Neg Hx      History   Social History  . Marital Status: Married    Spouse Name: N/A    Number of Children: N/A  . Years of Education: N/A   Occupational History  . Not on file.   Social History Main Topics  . Smoking status: Former Games developer  . Smokeless tobacco: Not on file  . Alcohol Use: No  . Drug Use:   . Sexually Active:    Other Topics Concern  . Not on file   Social History Narrative  . No narrative on file    ROS: Please see the HPI.  All other systems reviewed and negative.  PHYSICAL EXAM:   BP 136/88  Pulse 60  Ht 5\' 9"  (1.753 m)  Wt 197 lb 12.8 oz (89.721 kg)  BMI 29.21 kg/m2  General: elderly gentleman in good spirits.  No major complaints.   Head:  Normocephalic and atraumatic. Neck: no JVD Lungs: Clear to auscultation and  percussion. Heart: Normal S1 and S2.  No murmur, rubs or gallops.  Pulses: Pulses normal in all 4 extremities. Extremities: No clubbing or cyanosis. No edema. Neurologic: Alert and oriented x 3.  Some weakness with standing.    EKG:  Av pacing.    ASSESSMENT AND PLAN:

## 2012-06-08 NOTE — Assessment & Plan Note (Signed)
Will recheck CPK to assess where we are at present.

## 2012-06-08 NOTE — Assessment & Plan Note (Signed)
We currently have amiodarone on hold, and he is ok with this.

## 2012-06-08 NOTE — Assessment & Plan Note (Signed)
No recurrent angina.  Does remain on beta blocker.

## 2012-06-08 NOTE — Assessment & Plan Note (Signed)
Reasonable control. 

## 2012-06-24 ENCOUNTER — Other Ambulatory Visit: Payer: Self-pay | Admitting: Cardiology

## 2012-07-23 ENCOUNTER — Ambulatory Visit (INDEPENDENT_AMBULATORY_CARE_PROVIDER_SITE_OTHER): Payer: Medicare Other | Admitting: Cardiology

## 2012-07-23 ENCOUNTER — Telehealth: Payer: Self-pay | Admitting: Cardiology

## 2012-07-23 ENCOUNTER — Encounter: Payer: Self-pay | Admitting: Cardiology

## 2012-07-23 VITALS — BP 117/67 | HR 59 | Ht 69.0 in | Wt 200.0 lb

## 2012-07-23 DIAGNOSIS — I2510 Atherosclerotic heart disease of native coronary artery without angina pectoris: Secondary | ICD-10-CM

## 2012-07-23 DIAGNOSIS — IMO0001 Reserved for inherently not codable concepts without codable children: Secondary | ICD-10-CM

## 2012-07-23 DIAGNOSIS — M791 Myalgia, unspecified site: Secondary | ICD-10-CM

## 2012-07-23 DIAGNOSIS — I4891 Unspecified atrial fibrillation: Secondary | ICD-10-CM

## 2012-07-23 NOTE — Patient Instructions (Signed)
Your physician recommends that you have lab work today: CPK  Please call the office with the name, strength and directions of "red [phone]  Your physician recommends that you schedule a follow-up appointment in: 3 MONTHS with Dr Riley Kill  Your physician recommends that you continue on your current medications as directed. Please refer to the Current Medication list given to you today.

## 2012-07-23 NOTE — Progress Notes (Signed)
HPI:  The patient is in for followup. From a cardiac standpoint he appears to be stable. He's not been having any chest pain. His wife thinks that his muscle fatigue has improved. He is taking some medicine, the name of which is unknown to him. I've asked him to call the office and it promised me they will do so let us know what the name is on the bottle of the medication. Otherwise he has been stable.  He should be off of amiodarone at this time.  Current Outpatient Prescriptions  Medication Sig Dispense Refill  . amLODipine (NORVASC) 2.5 MG tablet TAKE 1 TABLET BY MOUTH TWICE A DAY  60 tablet  6  . aspirin 81 MG tablet Take 81 mg by mouth daily.        Marland Kitchen b complex vitamins tablet Take 1 tablet by mouth daily.        . carvedilol (COREG) 12.5 MG tablet TAKE 1/2 TABLET TWICE DAILY WITH MEALS  30 tablet  6  . clopidogrel (PLAVIX) 75 MG tablet TAKE 1 TABLET BY MOUTH DAILY WITH A MEAL  30 tablet  10  . Multiple Vitamins-Minerals (ICAPS) CAPS Take by mouth daily.        Allergies  Allergen Reactions  . Atorvastatin     REACTION: leg pain    Past Medical History  Diagnosis Date  . A-fib     paroxysmal, was on coumadin , pt desired to stop it , no longer a candidate per cards  . Tachy-brady syndrome   . CAD (coronary artery disease)     MI angioplasty 80, s/p CABG 01/2006- pacemaker  (12-2006), NSTMI  7-11  . CHF (congestive heart failure)   . Hyperlipidemia   . Hypertension   . Spinal stenosis      has seen Dr Ethelene Hal before, s/p local injection which helped x a while   . GERD (gastroesophageal reflux disease)   . RLS (restless legs syndrome)     ? of  . Vitamin B12 deficiency   . Insomnia   . History of BPH     TURP '96 at [facility] 96, urethoplast 97 ( History of bladder outlet obstruction, chronic incontinence)    Past Surgical History  Procedure Date  . Coronary artery bypass graft   . Pacemaker insertion     12-2006 PPM Medtronic  . Turp vaporization     x 2  . Inguinal hernia  repair     Right  . Replacement total knee 2006    Left  . Carpal tunnel release     bilateral    Family History  Problem Relation Age of Onset  . Heart disease Neg Hx   . Diabetes Neg Hx     History   Social History  . Marital Status: Married    Spouse Name: N/A    Number of Children: N/A  . Years of Education: N/A   Occupational History  . Not on file.   Social History Main Topics  . Smoking status: Former Games developer  . Smokeless tobacco: Not on file  . Alcohol Use: No  . Drug Use:   . Sexually Active:    Other Topics Concern  . Not on file   Social History Narrative  . No narrative on file    ROS: Please see the HPI.  All other systems reviewed and negative.  PHYSICAL EXAM:  BP 117/67  Pulse 59  Ht 5\' 9"  (1.753 m)  Wt 200 lb (90.719  kg)  BMI 29.53 kg/m2  General: Well developed, well nourished, in no acute distress. Head:  Normocephalic and atraumatic. Neck: no JVD Lungs: Clear to auscultation and percussion. Heart: Normal S1 and S2.  No murmur, rubs or gallops. Median sternotomy scar.  Healed.   Abdomen:  Normal bowel sounds; soft; non tender; no organomegaly.  Has ventral and umbilical hernia.   Pulses: Pulses normal in all 4 extremities. Extremities: No clubbing or cyanosis. No edema. Neurologic: Alert and oriented x 3.  EKG: av pacing.    ASSESSMENT AND PLAN:

## 2012-07-23 NOTE — Assessment & Plan Note (Signed)
No obvious recurrence.  Is off of Amio due to proximal muscle weakness.

## 2012-07-23 NOTE — Assessment & Plan Note (Signed)
No recurrent chest pain. 

## 2012-07-23 NOTE — Telephone Encounter (Signed)
PT TO CALL BACK WITH NAME OF RED PILL HE WAS TAKING IT IS [facility]

## 2012-07-23 NOTE — Assessment & Plan Note (Signed)
Muscle fatigue is better.  Able to get out of the chair.  Will see patient back in follow up.  Recheck CPK.

## 2012-07-25 ENCOUNTER — Telehealth: Payer: Self-pay | Admitting: *Deleted

## 2012-07-25 NOTE — Telephone Encounter (Signed)
Message copied by Tarri Fuller on Fri Jul 25, 2012  8:35 AM ------      Message from: Shawnie Pons D      Created: Fri Jul 25, 2012  6:43 AM       Let him know this looks some better.  Thanks.  TS

## 2012-07-25 NOTE — Telephone Encounter (Signed)
Pt aware..cmf

## 2012-07-25 NOTE — Telephone Encounter (Signed)
This is listed on the pt's medication list.

## 2012-07-31 ENCOUNTER — Encounter: Payer: Medicare Other | Admitting: *Deleted

## 2012-08-07 ENCOUNTER — Encounter: Payer: Self-pay | Admitting: *Deleted

## 2012-08-18 ENCOUNTER — Ambulatory Visit (INDEPENDENT_AMBULATORY_CARE_PROVIDER_SITE_OTHER): Payer: Medicare Other | Admitting: *Deleted

## 2012-08-18 DIAGNOSIS — Z950 Presence of cardiac pacemaker: Secondary | ICD-10-CM

## 2012-08-18 DIAGNOSIS — I4891 Unspecified atrial fibrillation: Secondary | ICD-10-CM

## 2012-08-21 LAB — REMOTE PACEMAKER DEVICE
ATRIAL PACING PM: 92
BAMS-0001: 175 {beats}/min
RV LEAD IMPEDENCE PM: 449 Ohm
VENTRICULAR PACING PM: 100

## 2012-09-01 ENCOUNTER — Emergency Department (HOSPITAL_COMMUNITY)
Admission: EM | Admit: 2012-09-01 | Discharge: 2012-09-01 | Disposition: A | Discharge status: Home / Self Care | Payer: Medicare Other | Attending: Emergency Medicine | Admitting: Emergency Medicine

## 2012-09-01 ENCOUNTER — Encounter (HOSPITAL_COMMUNITY): Payer: Self-pay | Admitting: Emergency Medicine

## 2012-09-01 DIAGNOSIS — E785 Hyperlipidemia, unspecified: Secondary | ICD-10-CM | POA: Insufficient documentation

## 2012-09-01 DIAGNOSIS — W268XXA Contact with other sharp object(s), not elsewhere classified, initial encounter: Secondary | ICD-10-CM | POA: Insufficient documentation

## 2012-09-01 DIAGNOSIS — I10 Essential (primary) hypertension: Secondary | ICD-10-CM | POA: Insufficient documentation

## 2012-09-01 DIAGNOSIS — K219 Gastro-esophageal reflux disease without esophagitis: Secondary | ICD-10-CM | POA: Insufficient documentation

## 2012-09-01 DIAGNOSIS — S51819A Laceration without foreign body of unspecified forearm, initial encounter: Secondary | ICD-10-CM

## 2012-09-01 DIAGNOSIS — E538 Deficiency of other specified B group vitamins: Secondary | ICD-10-CM | POA: Insufficient documentation

## 2012-09-01 DIAGNOSIS — I4891 Unspecified atrial fibrillation: Secondary | ICD-10-CM | POA: Insufficient documentation

## 2012-09-01 DIAGNOSIS — S51809A Unspecified open wound of unspecified forearm, initial encounter: Secondary | ICD-10-CM | POA: Insufficient documentation

## 2012-09-01 DIAGNOSIS — G4700 Insomnia, unspecified: Secondary | ICD-10-CM | POA: Insufficient documentation

## 2012-09-01 DIAGNOSIS — Z87891 Personal history of nicotine dependence: Secondary | ICD-10-CM | POA: Insufficient documentation

## 2012-09-01 DIAGNOSIS — I2510 Atherosclerotic heart disease of native coronary artery without angina pectoris: Secondary | ICD-10-CM | POA: Insufficient documentation

## 2012-09-01 DIAGNOSIS — I509 Heart failure, unspecified: Secondary | ICD-10-CM | POA: Insufficient documentation

## 2012-09-01 MED ORDER — TETANUS-DIPHTH-ACELL PERTUSSIS 5-2.5-18.5 LF-MCG/0.5 IM SUSP
INTRAMUSCULAR | Status: AC
  Administered 2012-09-01: 0.5 mL via INTRAMUSCULAR
  Filled 2012-09-01: qty 0.5

## 2012-09-01 NOTE — ED Notes (Signed)
Pt fell against lawn tractor and cut his right forearm.  Pt reports he did not pass out, but does not understand how he fell.  Bleeding under control.  Last tetanus unknown.  Pt denies hitting head or any other area.

## 2012-09-02 NOTE — ED Provider Notes (Signed)
History     CSN: [id number]  Arrival date & time 09/01/12  1831   First MD Initiated Contact with Patient 09/01/12 1836      Chief Complaint  Patient presents with  . Laceration    (Consider location/radiation/quality/duration/timing/severity/associated sxs/prior treatment) HPI The patient presents after a mechanical fall with a right forearm laceration.  He notes that he stumbled, struck his forearm against a lawnmower.  He denies trauma, loss of consciousness, any other focal complaints.  He is in his usual state of health.  Tetanus is not up-to-date. Past Medical History  Diagnosis Date  . A-fib     paroxysmal, was on coumadin , pt desired to stop it , no longer a candidate per cards  . Tachy-brady syndrome   . CAD (coronary artery disease)     MI angioplasty 42, s/p CABG 01/2006- pacemaker  (12-2006), NSTMI  7-11  . CHF (congestive heart failure)   . Hyperlipidemia   . Hypertension   . Spinal stenosis      has seen Dr Ethelene Hal before, s/p local injection which helped x a while   . GERD (gastroesophageal reflux disease)   . RLS (restless legs syndrome)     ? of  . Vitamin B12 deficiency   . Insomnia   . History of BPH     TURP '96 at [facility] 96, urethoplast 97 ( History of bladder outlet obstruction, chronic incontinence)    Past Surgical History  Procedure Date  . Coronary artery bypass graft   . Pacemaker insertion     12-2006 PPM Medtronic  . Turp vaporization     x 2  . Inguinal hernia repair     Right  . Replacement total knee 2006    Left  . Carpal tunnel release     bilateral    Family History  Problem Relation Age of Onset  . Heart disease Neg Hx   . Diabetes Neg Hx     History  Substance Use Topics  . Smoking status: Former Games developer  . Smokeless tobacco: Not on file  . Alcohol Use: No      Review of Systems  All other systems reviewed and are negative.    Allergies  Atorvastatin  Home Medications   Current Outpatient Rx  Name Route Sig  Dispense Refill  . AMLODIPINE BESYLATE 2.5 MG PO TABS Oral Take 2.5 mg by mouth 2 (two) times daily.    . ASPIRIN 81 MG PO TABS Oral Take 81 mg by mouth daily.     . B COMPLEX PO TABS Oral Take 1 tablet by mouth daily.     . OCUVITE PO TABS Oral Take 1 tablet by mouth daily.    Marland Kitchen CARVEDILOL 12.5 MG PO TABS Oral Take 6.25 mg by mouth 2 (two) times daily with a meal.    . CLOPIDOGREL BISULFATE 75 MG PO TABS Oral Take 75 mg by mouth daily.      BP 140/67  Pulse 63  Temp 97.3 F (36.3 C) (Oral)  Resp 20  Wt 195 lb (88.451 kg)  SpO2 99%  Physical Exam  Nursing note and vitals reviewed. Constitutional: He appears well-developed and well-nourished. No distress.  HENT:  Head: Normocephalic and atraumatic.  Eyes: Conjunctivae normal and EOM are normal.  Neck: No tracheal deviation present.  Cardiovascular: Normal rate and regular rhythm.   Pulmonary/Chest: Effort normal. No stridor. No respiratory distress.  Musculoskeletal:       Arms: Skin: He is not diaphoretic.  ED Course  Procedures (including critical care time)  Labs Reviewed - No data to display No results found.   1. Laceration of forearm     LACERATION REPAIR Performed by: Gerhard Munch Authorized by: Gerhard Munch Consent: Verbal consent obtained. Risks and benefits: risks, benefits and alternatives were discussed Consent given by: patient Patient identity confirmed: provided demographic data Prepped and Draped in normal sterile fashion Wound explored  Laceration Location: r forearm  Laceration Length: 12cm  No Foreign Bodies seen or palpated  Anesthesia: local infiltration  Local anesthetic: lidocaine 2%  Anesthetic total: 9 ml  Irrigation method: syringe Amount of cleaning: standard  Skin closure: 5 5-0, 3 4-0 nylon, simple interupted w rough close approximation given the wounds edges   Patient tolerance: Patient tolerated the procedure well with no immediate complications.   MDM    The patient presents after a mechanical fall.  On exam he is in no distress.  There is a right forearm laceration, which is rough edged, but was repaired w decent approximation.          Gerhard Munch, MD 09/02/12 [phone]

## 2012-09-03 ENCOUNTER — Telehealth: Payer: Self-pay | Admitting: Internal Medicine

## 2012-09-03 NOTE — Telephone Encounter (Signed)
Message copied by Wanda Plump on Wed Sep 03, 2012  3:05 PM ------      Message from: Edwena Felty T      Created: Wed Sep 03, 2012 10:35 AM      Contact: Laurie-wife       Please advise.       ----- Message -----         From: Theodis Sato McDaniels         Sent: 09/03/2012   9:49 AM           To: Nada Maclachlan, MA            Pt was seen in ED 9.30.13 was to follow up wt/Ohn Bostic in 1-wk offered 10.11.13 at 3pm which is 4-days past. Return date would need to be Monday 10.7.13, where would you like this ed f/u 30-min. appt with stitch removal       Please advise & I will call wife back at [phone]      NOTE I have put pt in on 10.11.13 to hold the next appt

## 2012-09-03 NOTE — Telephone Encounter (Signed)
Needs to be seen 09/08/2012 or the next day, if this is  only a suture removal, then 15 minutes a appointment is okay. Please make an appointment, ok to [facility]

## 2012-09-04 NOTE — Telephone Encounter (Signed)
THX!

## 2012-09-04 NOTE — Telephone Encounter (Signed)
I had to use your same day slots, put pt in for 30-minutes, will be coming in 10.7.13 at 3:30pm Thanks

## 2012-09-08 ENCOUNTER — Ambulatory Visit (INDEPENDENT_AMBULATORY_CARE_PROVIDER_SITE_OTHER): Payer: Medicare Other

## 2012-09-08 ENCOUNTER — Ambulatory Visit (INDEPENDENT_AMBULATORY_CARE_PROVIDER_SITE_OTHER): Payer: Medicare Other | Admitting: Internal Medicine

## 2012-09-08 VITALS — BP 122/84 | HR 62 | Temp 98.0°F | Wt 204.0 lb

## 2012-09-08 DIAGNOSIS — Z23 Encounter for immunization: Secondary | ICD-10-CM

## 2012-09-08 DIAGNOSIS — M542 Cervicalgia: Secondary | ICD-10-CM

## 2012-09-08 DIAGNOSIS — S41109A Unspecified open wound of unspecified upper arm, initial encounter: Secondary | ICD-10-CM

## 2012-09-08 DIAGNOSIS — R55 Syncope and collapse: Secondary | ICD-10-CM

## 2012-09-08 MED ORDER — DOXYCYCLINE HYCLATE 100 MG PO TABS: 100.0000 mg | ORAL_TABLET | Freq: Two times a day (BID) | ORAL | Status: DC

## 2012-09-08 MED ORDER — HYDROCODONE-ACETAMINOPHEN 5-500 MG PO TABS: 1.0000 | ORAL_TABLET | Freq: Every evening | ORAL | Status: DC | PRN

## 2012-09-08 NOTE — Patient Instructions (Addendum)
Tomorrow: ---Get blood work here ---get your x-ray at the other [facility]  office located at: [address] [place], across from [facility].  Please go to the basement, this is a walk-in facility, they are open from 8:30 to 5:30 PM. Phone number [phone].   Take doxycycline 1 tablet twice a day for one week For pain, take Tylenol during the daytime, you can take hydrocodone one or 2 tablets at bedtime, will cause drowsiness. Be careful.  Wound Care: Clean r area with peroxide, pad it dry, put an antibiotic ointment and cover with gauze Call anytime if he you see swelling, redness, discharge  Come back in 10 days for a wound checkup

## 2012-09-08 NOTE — Progress Notes (Signed)
Subjective:    Patient ID: [name], male    DOB: 12/17/1920, 78 y.o.   MRN: [id number]  HPI ER followup Patient was seen at the ER at 09/01/2012 for a mechanical fall, had a laceration at the right arm, stitches were placed, he was recommended to followup here today for stitches removal. The wound seems to be doing okay, he has not seen any discharge coming out.  After he left the ER, he kept thinking about what happened and now he is convinced he loss consciousness  briefly before the incident. He reports that he worked several hours in the yard, the accident happened when he was finishing up. He reports that he loss consciousness briefly, had a fall, hit his arm and immediately wake up. Denies any headaches, chest pain or palpitations at the time of the incident. Denies bladder or bowel incontinence No head injury. He did report right neck and trapezoid pain since the accident, worse with certain head and shoulder  movements ; pain is worse at night. Again he denies any headache per se, no nausea, vomiting or dizziness. No upper or lower extremity tingling or paresthesias.  Past Medical History  Diagnosis Date  . A-fib     paroxysmal, was on coumadin , pt desired to stop it , no longer a candidate per cards  . Tachy-brady syndrome   . CAD (coronary artery disease)     MI angioplasty 65, s/p CABG 01/2006- pacemaker  (12-2006), NSTMI  7-11  . CHF (congestive heart failure)   . Hyperlipidemia   . Hypertension   . Spinal stenosis      has seen Dr Ethelene Hal before, s/p local injection which helped x a while   . GERD (gastroesophageal reflux disease)   . RLS (restless legs syndrome)     ? of  . Vitamin B12 deficiency   . Insomnia   . History of BPH     TURP '96 at [facility] 96, urethoplast 97 ( History of bladder outlet obstruction, chronic incontinence)   Past Surgical History  Procedure Date  . Coronary artery bypass graft   . Pacemaker insertion     12-2006 PPM Medtronic  .  Turp vaporization     x 2  . Inguinal hernia repair     Right  . Replacement total knee 2006    Left  . Carpal tunnel release     bilateral     Review of Systems See HPI    Objective:   Physical Exam General -- alert, well-developed    Neck -FROM, not tender to palpation in the cervical spine Lungs -- normal respiratory effort, no intercostal retractions, no accessory muscle use, and normal breath sounds.   Heart-- normal rate, regular rhythm, no murmur, and no gallop.    Extremities-- right arm laceration with some swelling, distal a he has a small amount of purulent discharge. Musculoskeletal: Range of motion of the shoulders normal, range of motion of the elbow normal Neurologic-- alert & oriented X3 , speech is fluent, memory is normal , strength normal in all extremities. Psych-- Cognition and judgment appear intact. Alert and cooperative with normal attention span and concentration.  not anxious appearing and not depressed appearing.       Assessment & Plan:  Arm laceration, Stitches were removed, wound clean with peroxide, I did notice some purulent discharge   Plan: Wound care, see instructions, doxycycline Come back in 10 days for reassessment   Neck pain, Has developed R trapezoid  pain that radiates from the neck, suspected injury from the fall. He is not on Coumadin, denies any headache or nausea.  Head is atraumatic Plan: X-ray of the neck Needs better pain control, Tylenol is not helping, prescribe Lorcet, patient aware of side effects.

## 2012-09-09 ENCOUNTER — Encounter: Payer: Self-pay | Admitting: Internal Medicine

## 2012-09-09 ENCOUNTER — Ambulatory Visit (INDEPENDENT_AMBULATORY_CARE_PROVIDER_SITE_OTHER)
Admission: RE | Admit: 2012-09-09 | Discharge: 2012-09-09 | Disposition: A | Discharge status: Home / Self Care | Payer: Medicare Other | Source: Ambulatory Visit | Attending: Internal Medicine | Admitting: Internal Medicine

## 2012-09-09 ENCOUNTER — Other Ambulatory Visit: Payer: Medicare Other

## 2012-09-09 DIAGNOSIS — M542 Cervicalgia: Secondary | ICD-10-CM

## 2012-09-09 LAB — CBC WITH DIFFERENTIAL/PLATELET
Basophils Relative: 0.2 % (ref 0.0–3.0)
Eosinophils Relative: 4.7 % (ref 0.0–5.0)
HCT: 36 % — ABNORMAL LOW (ref 39.0–52.0)
Hemoglobin: 11.9 g/dL — ABNORMAL LOW (ref 13.0–17.0)
Lymphocytes Relative: 13.1 % (ref 12.0–46.0)
Lymphs Abs: 1.1 10*3/uL (ref 0.7–4.0)
Monocytes Relative: 7.1 % (ref 3.0–12.0)
Neutro Abs: 6.6 10*3/uL (ref 1.4–7.7)
RBC: 3.87 Mil/uL — ABNORMAL LOW (ref 4.22–5.81)

## 2012-09-09 LAB — BASIC METABOLIC PANEL
Calcium: 8.6 mg/dL (ref 8.4–10.5)
Chloride: 105 mEq/L (ref 96–112)
GFR: 65.65 mL/min (ref >60.00)

## 2012-09-09 NOTE — Assessment & Plan Note (Signed)
His history suggest syncope, not a mechanical fall. EKG seems   at baseline. Plan:  We'll ask cardiology to see ER if symptoms recur BMP CBC

## 2012-09-11 ENCOUNTER — Telehealth: Payer: Self-pay

## 2012-09-11 NOTE — Telephone Encounter (Signed)
Spoke with pt to advise him that [facility] hasn't reviewed and put result notes on xray yet as soon as he does we will contact him. Pt stated understanding.       MW

## 2012-09-12 ENCOUNTER — Ambulatory Visit: Payer: Medicare Other | Admitting: Internal Medicine

## 2012-09-12 ENCOUNTER — Telehealth: Payer: Self-pay | Admitting: Internal Medicine

## 2012-09-12 NOTE — Telephone Encounter (Signed)
Discussed with pt

## 2012-09-12 NOTE — Telephone Encounter (Signed)
Pt and wife called wanting to know results of last weeks xray, pls call pt.

## 2012-09-16 ENCOUNTER — Encounter: Payer: Self-pay | Admitting: Cardiology

## 2012-09-16 ENCOUNTER — Ambulatory Visit (INDEPENDENT_AMBULATORY_CARE_PROVIDER_SITE_OTHER): Payer: Medicare Other | Admitting: Cardiology

## 2012-09-16 ENCOUNTER — Ambulatory Visit (INDEPENDENT_AMBULATORY_CARE_PROVIDER_SITE_OTHER): Payer: Medicare Other | Admitting: *Deleted

## 2012-09-16 ENCOUNTER — Encounter: Payer: Self-pay | Admitting: *Deleted

## 2012-09-16 VITALS — BP 126/72 | HR 63 | Ht 70.0 in | Wt 209.0 lb

## 2012-09-16 DIAGNOSIS — I4891 Unspecified atrial fibrillation: Secondary | ICD-10-CM

## 2012-09-16 DIAGNOSIS — E785 Hyperlipidemia, unspecified: Secondary | ICD-10-CM

## 2012-09-16 DIAGNOSIS — I498 Other specified cardiac arrhythmias: Secondary | ICD-10-CM

## 2012-09-16 DIAGNOSIS — R55 Syncope and collapse: Secondary | ICD-10-CM

## 2012-09-16 DIAGNOSIS — I2510 Atherosclerotic heart disease of native coronary artery without angina pectoris: Secondary | ICD-10-CM

## 2012-09-16 DIAGNOSIS — R609 Edema, unspecified: Secondary | ICD-10-CM

## 2012-09-16 DIAGNOSIS — I472 Ventricular tachycardia: Secondary | ICD-10-CM

## 2012-09-16 NOTE — Progress Notes (Signed)
HPI: The patient is seen today in followup. Recently, he was in his yard working. He got very, very tired and had been working all day long. Next, he thinks he passed out. When this happened, he hurt his arm and idiopathic stitches. He also hurt his neck, and apparently has some malalignment. He got a tetanus shot at that time as well. Since that time he is been doing pretty well his stitches her come out.  He denies any chest pain or other significant major issues.  He has had some mild swellling in the legs bilaterally.  No other new symptoms    Current Outpatient Prescriptions  Medication Sig Dispense Refill  . amLODipine (NORVASC) 2.5 MG tablet Take 2.5 mg by mouth 2 (two) times daily.      Marland Kitchen aspirin 81 MG tablet Take 81 mg by mouth daily.       Marland Kitchen b complex vitamins tablet Take 1 tablet by mouth daily.       . beta carotene w/minerals (OCUVITE) tablet Take 1 tablet by mouth daily.      . carvedilol (COREG) 12.5 MG tablet Take 6.25 mg by mouth 2 (two) times daily with a meal.      . clopidogrel (PLAVIX) 75 MG tablet Take 75 mg by mouth daily.      Marland Kitchen doxycycline (VIBRA-TABS) 100 MG tablet Take 1 tablet (100 mg total) by mouth 2 (two) times daily.  14 tablet  0  . HYDROcodone-acetaminophen (VICODIN) 5-500 MG per tablet Take 1-2 tablets by mouth at bedtime as needed for pain.  20 tablet  0    Allergies  Allergen Reactions  . Atorvastatin     REACTION: leg pain    Past Medical History  Diagnosis Date  . A-fib     paroxysmal, was on coumadin , pt desired to stop it , no longer a candidate per cards  . Tachy-brady syndrome   . CAD (coronary artery disease)     MI angioplasty 47, s/p CABG 01/2006- pacemaker  (12-2006), NSTMI  7-11  . CHF (congestive heart failure)   . Hyperlipidemia   . Hypertension   . Spinal stenosis      has seen Dr Ethelene Hal before, s/p local injection which helped x a while   . GERD (gastroesophageal reflux disease)   . RLS (restless legs syndrome)     ? of  .  Vitamin B12 deficiency   . Insomnia   . History of BPH     TURP '96 at [facility] 96, urethoplast 97 ( History of bladder outlet obstruction, chronic incontinence)    Past Surgical History  Procedure Date  . Coronary artery bypass graft   . Pacemaker insertion     12-2006 PPM Medtronic  . Turp vaporization     x 2  . Inguinal hernia repair     Right  . Replacement total knee 2006    Left  . Carpal tunnel release     bilateral    Family History  Problem Relation Age of Onset  . Heart disease Neg Hx   . Diabetes Neg Hx     History   Social History  . Marital Status: Married    Spouse Name: N/A    Number of Children: N/A  . Years of Education: N/A   Occupational History  . Not on file.   Social History Main Topics  . Smoking status: Former Games developer  . Smokeless tobacco: Not on file  . Alcohol Use: No  .  Drug Use:   . Sexually Active:    Other Topics Concern  . Not on file   Social History Narrative  . No narrative on file    ROS: Please see the HPI.  All other systems reviewed and negative.  PHYSICAL EXAM:  BP 126/72  Pulse 63  Ht 5\' 10"  (1.778 m)  Wt 209 lb (94.802 kg)  BMI 29.99 kg/m2  SpO2 97%  General: Well developed, well nourished, in no acute distress. Head:  Normocephalic and atraumatic. Neck: no JVD Lungs: Clear to auscultation and percussion. Heart: Normal S1 and S2.  2/6 SEM.  No DM.    Abdomen:  Normal bowel sounds; soft; non tender; no organomegaly Pulses: Pulses normal in all 4 extremities. Extremities: No clubbing or cyanosis. No edema. Neurologic: Alert and oriented x 3.  EKG:   AV pacing.    Pacer interrogated today.  No events.  2 mode switches since last visit.    ASSESSMENT AND PLAN:

## 2012-09-16 NOTE — Patient Instructions (Addendum)
Your physician recommends that you schedule a follow-up appointment in: 3 MONTHS with Dr Riley Kill  Your physician recommends that you continue on your current medications as directed. Please refer to the Current Medication list given to you today.  Your physician has requested that you have a lower extremity venous duplex. This test is an ultrasound of the veins in the legs. It looks at venous blood flow that carries blood from the heart to the legs. Allow one hour for a Lower Venous exam. There are no restrictions or special instructions.

## 2012-09-17 ENCOUNTER — Ambulatory Visit (INDEPENDENT_AMBULATORY_CARE_PROVIDER_SITE_OTHER): Payer: Medicare Other | Admitting: Internal Medicine

## 2012-09-17 VITALS — BP 136/74 | HR 67 | Temp 97.7°F | Wt 212.0 lb

## 2012-09-17 DIAGNOSIS — S41119A Laceration without foreign body of unspecified upper arm, initial encounter: Secondary | ICD-10-CM

## 2012-09-17 DIAGNOSIS — S41109A Unspecified open wound of unspecified upper arm, initial encounter: Secondary | ICD-10-CM

## 2012-09-17 DIAGNOSIS — M542 Cervicalgia: Secondary | ICD-10-CM

## 2012-09-17 NOTE — Progress Notes (Signed)
  Subjective:    Patient ID: [name], male    DOB: 03/30/1921, 78 y.o.   MRN: [id number]  HPI Here for a wound check. Stays the wound looks better, no discharge. Also needs a ear lavage on the right, was unable to get   hearing aid fitted. Continue with mild neck pain.  Past Medical History  Diagnosis Date  . A-fib     paroxysmal, was on coumadin , pt desired to stop it , no longer a candidate per cards  . Tachy-brady syndrome   . CAD (coronary artery disease)     MI angioplasty 11, s/p CABG 01/2006- pacemaker  (12-2006), NSTMI  7-11  . CHF (congestive heart failure)   . Hyperlipidemia   . Hypertension   . Spinal stenosis      has seen Dr Ethelene Hal before, s/p local injection which helped x a while   . GERD (gastroesophageal reflux disease)   . RLS (restless legs syndrome)     ? of  . Vitamin B12 deficiency   . Insomnia   . History of BPH     TURP '96 at [facility] 96, urethoplast 97 ( History of bladder outlet obstruction, chronic incontinence)   Past Surgical History  Procedure Date  . Coronary artery bypass graft   . Pacemaker insertion     12-2006 PPM Medtronic  . Turp vaporization     x 2  . Inguinal hernia repair     Right  . Replacement total knee 2006    Left  . Carpal tunnel release     bilateral    Review of Systems     Objective:   Physical Exam Alert oriented x3, no apparent distress Neck: Cervical spine is nontender to palpation, range of motion is only slightly limited. Ears: abundant wax R, small amount of wax on the left. Wound at the right arm: No redness, swelling or discharge. Seems to be healing well.       Assessment & Plan:  Wound, seems to be healing well Cerumen impaction status post a lavage, right ear looks clear, tympanic membranes normal Neck pain, plans to call orthopedic surgery

## 2012-09-18 ENCOUNTER — Encounter: Payer: Self-pay | Admitting: Internal Medicine

## 2012-09-19 ENCOUNTER — Encounter: Payer: Self-pay | Admitting: Internal Medicine

## 2012-09-19 LAB — PACEMAKER DEVICE OBSERVATION
ATRIAL PACING PM: 92
BATTERY VOLTAGE: 2.74 V
RV LEAD THRESHOLD: 0.75 V
VENTRICULAR PACING PM: 99

## 2012-09-19 NOTE — Progress Notes (Signed)
Pacemaker interrogation only.

## 2012-09-21 NOTE — Assessment & Plan Note (Signed)
Has not had recent angina.

## 2012-09-21 NOTE — Assessment & Plan Note (Signed)
He is currently not taking statins. He did have elevated CPK of un clear cause, and that most recently has been better. As such, given his age, it probably makes sense to keep him off of therapy at this point

## 2012-09-21 NOTE — Assessment & Plan Note (Signed)
Is not entirely clear what happened. Functionally, the patient does not drive. He does think he was really tired at the time that it occurred. There is currently no evidence of pacer malfunction. From a cardiac standpoint, he'll only need either a ventricular arrhythmia, or any episode of vasodepressor syncope. I doubt the former is possible.  We'll continue to follow patient closely I will see him in followup.

## 2012-09-22 ENCOUNTER — Encounter (INDEPENDENT_AMBULATORY_CARE_PROVIDER_SITE_OTHER): Payer: Medicare Other

## 2012-09-22 DIAGNOSIS — I4891 Unspecified atrial fibrillation: Secondary | ICD-10-CM

## 2012-09-22 DIAGNOSIS — M79609 Pain in unspecified limb: Secondary | ICD-10-CM

## 2012-09-22 DIAGNOSIS — R609 Edema, unspecified: Secondary | ICD-10-CM

## 2012-09-23 ENCOUNTER — Telehealth: Payer: Self-pay | Admitting: Cardiology

## 2012-09-23 ENCOUNTER — Emergency Department (HOSPITAL_COMMUNITY)
Admission: EM | Admit: 2012-09-23 | Discharge: 2012-09-23 | Disposition: A | Discharge status: Home / Self Care | Payer: Medicare Other | Attending: Emergency Medicine | Admitting: Emergency Medicine

## 2012-09-23 DIAGNOSIS — K219 Gastro-esophageal reflux disease without esophagitis: Secondary | ICD-10-CM | POA: Insufficient documentation

## 2012-09-23 DIAGNOSIS — I509 Heart failure, unspecified: Secondary | ICD-10-CM | POA: Insufficient documentation

## 2012-09-23 DIAGNOSIS — Z7982 Long term (current) use of aspirin: Secondary | ICD-10-CM | POA: Insufficient documentation

## 2012-09-23 DIAGNOSIS — K5900 Constipation, unspecified: Secondary | ICD-10-CM | POA: Insufficient documentation

## 2012-09-23 DIAGNOSIS — I2510 Atherosclerotic heart disease of native coronary artery without angina pectoris: Secondary | ICD-10-CM | POA: Insufficient documentation

## 2012-09-23 DIAGNOSIS — Z8679 Personal history of other diseases of the circulatory system: Secondary | ICD-10-CM | POA: Insufficient documentation

## 2012-09-23 DIAGNOSIS — I10 Essential (primary) hypertension: Secondary | ICD-10-CM | POA: Insufficient documentation

## 2012-09-23 DIAGNOSIS — Z950 Presence of cardiac pacemaker: Secondary | ICD-10-CM | POA: Insufficient documentation

## 2012-09-23 DIAGNOSIS — Z79899 Other long term (current) drug therapy: Secondary | ICD-10-CM | POA: Insufficient documentation

## 2012-09-23 DIAGNOSIS — Z7901 Long term (current) use of anticoagulants: Secondary | ICD-10-CM | POA: Insufficient documentation

## 2012-09-23 DIAGNOSIS — Z951 Presence of aortocoronary bypass graft: Secondary | ICD-10-CM | POA: Insufficient documentation

## 2012-09-23 MED ORDER — MAGNESIUM CITRATE PO SOLN: 300.0000 mL | Freq: Once | ORAL | Status: DC

## 2012-09-23 MED ORDER — POLYETHYLENE GLYCOL 3350 17 GM/SCOOP PO POWD: 17.0000 g | Freq: Every day | ORAL | Status: DC

## 2012-09-23 MED ORDER — DOCUSATE SODIUM 100 MG PO CAPS: 100.0000 mg | ORAL_CAPSULE | Freq: Two times a day (BID) | ORAL | Status: DC

## 2012-09-23 NOTE — Telephone Encounter (Signed)
Please see previous note. Was not routed when first sent.

## 2012-09-23 NOTE — ED Notes (Signed)
PA at bedside.

## 2012-09-23 NOTE — ED Provider Notes (Signed)
History     CSN: [id number]  Arrival date & time 09/23/12  1818   First MD Initiated Contact with Patient 09/23/12 2010      Chief Complaint  Patient presents with  . Constipation   HPI  History provided by the patient and wife. Patient is an 78 year old male with history of hypertension, hyperlipidemia, CAD, CHF who presents with complaints of constipation. Patient reports having a recent laceration and injury to his right arm last month after a fall. He was treated for this and during that time was prescribed Vicodin pain medications for pain and soreness in right shoulder and neck area. Patient is continuing to be evaluated for those symptoms orthopedic specialist denies any change today. Over the past few days patient noticed having increasingly hard stools with straining. Today patient reports having a very hard stool with only a small amount. He reports not feeling completely evacuated. Patient has not used any treatments for his constipation symptoms. He reports occasional abdominal discomfort but denies any significant pains. Denies any nausea or vomiting. Denies any diet change.    Past Medical History  Diagnosis Date  . A-fib     paroxysmal, was on coumadin , pt desired to stop it , no longer a candidate per cards  . Tachy-brady syndrome   . CAD (coronary artery disease)     MI angioplasty 70, s/p CABG 01/2006- pacemaker  (12-2006), NSTMI  7-11  . CHF (congestive heart failure)   . Hyperlipidemia   . Hypertension   . Spinal stenosis      has seen Dr Ethelene Hal before, s/p local injection which helped x a while   . GERD (gastroesophageal reflux disease)   . RLS (restless legs syndrome)     ? of  . Vitamin B12 deficiency   . Insomnia   . History of BPH     TURP '96 at [facility] 96, urethoplast 97 ( History of bladder outlet obstruction, chronic incontinence)    Past Surgical History  Procedure Date  . Coronary artery bypass graft   . Pacemaker insertion     12-2006 PPM  Medtronic  . Turp vaporization     x 2  . Inguinal hernia repair     Right  . Replacement total knee 2006    Left  . Carpal tunnel release     bilateral    Family History  Problem Relation Age of Onset  . Heart disease Neg Hx   . Diabetes Neg Hx     History  Substance Use Topics  . Smoking status: Former Games developer  . Smokeless tobacco: Not on file  . Alcohol Use: No      Review of Systems  Constitutional: Negative for fever, chills and diaphoresis.  Respiratory: Negative for shortness of breath.   Cardiovascular: Negative for chest pain.  Gastrointestinal: Positive for abdominal pain and constipation. Negative for nausea, vomiting, diarrhea and blood in stool.    Allergies  Atorvastatin  Home Medications   Current Outpatient Rx  Name Route Sig Dispense Refill  . AMLODIPINE BESYLATE 2.5 MG PO TABS Oral Take 2.5 mg by mouth 2 (two) times daily.    . ASPIRIN 81 MG PO TABS Oral Take 81 mg by mouth daily.     . B COMPLEX PO TABS Oral Take 1 tablet by mouth daily.     . OCUVITE PO TABS Oral Take 1 tablet by mouth daily.    Marland Kitchen CARVEDILOL 12.5 MG PO TABS Oral Take 6.25 mg by  mouth 2 (two) times daily with a meal.    . CLOPIDOGREL BISULFATE 75 MG PO TABS Oral Take 75 mg by mouth daily.    Marland Kitchen DOXYCYCLINE HYCLATE 100 MG PO TABS Oral Take 1 tablet (100 mg total) by mouth 2 (two) times daily. 14 tablet 0  . HYDROCODONE-ACETAMINOPHEN 5-500 MG PO TABS Oral Take 1-2 tablets by mouth at bedtime as needed for pain. 20 tablet 0    BP 144/69  Pulse 63  Temp 98.5 F (36.9 C) (Oral)  Resp 17  SpO2 99%  Physical Exam  Nursing note and vitals reviewed. Constitutional: He is oriented to person, place, and time. He appears well-developed and well-nourished. No distress.  HENT:  Head: Normocephalic.  Cardiovascular: Normal rate and regular rhythm.   Pulmonary/Chest: Effort normal and breath sounds normal. No respiratory distress. He has no wheezes. He has no rales.       Pacemaker in  left chest  Abdominal: Soft. Bowel sounds are normal. He exhibits no distension. There is no tenderness. There is no rebound and no guarding.  Neurological: He is alert and oriented to person, place, and time.  Skin: Skin is warm.  Psychiatric: He has a normal mood and affect.    ED Course  Procedures     1. Constipation       MDM  8:30PM patient seen and evaluated. Patient just returned from the restroom in the emergency room after a large bowel movement reports feeling significantly better at this time. Patient requesting to return home.        Angus Seller, PA 09/23/12 2130  Flint Melter, MD 09/24/12 Georgiann Mohs

## 2012-09-23 NOTE — Telephone Encounter (Signed)
New Problem:    Patient's wife called in wanting to know the results of her husbands Lower Venous form 09/22/12.  Please call back.

## 2012-09-23 NOTE — ED Provider Notes (Signed)
Medical screening examination/treatment/procedure(s) were conducted as a shared visit with non-physician practitioner(s) and myself.  I personally evaluated the patient during the encounter   Patient is alert, comfortable, and now, after having a bowel movement, in the emergency department. He is ambulating in no distress. The wound on his right forearm is healing with a very minimal cab present.  Treatment plan; Atlee Abide, MD 09/24/12 0120

## 2012-09-23 NOTE — Telephone Encounter (Signed)
I spoke with the pt's wife and made her aware that I do not have the final results of LEV. The pt is complaining of swelling in his feet and ankles.  I made the pt aware that he needs to wear support stockings and elevate feet.  I also explained to the pt that Amlodipine can cause lower extremity swelling. The pt's wife will get stockings.

## 2012-09-23 NOTE — ED Notes (Signed)
Pt present with c/o constipation.  Pt states "I had a bowel movement yesterday and it was very dry.  I had no luck with a bowel movement today,"  Pt denies pain.  Pt reports discomfort and soreness from rectal area from attempting to go."   Pt reports taknig vicodin three days a go and denies taking any recent narcotics for pain.

## 2012-09-25 NOTE — Telephone Encounter (Signed)
I spoke with the pt and made him aware of LEV results.  The pt has started wearing support stockings and his swelling has improved.  The pt does use salt on his food and I made him aware that he needs to decrease his salt intake to help with swelling.  Pt agreed with plan.

## 2012-09-25 NOTE — Telephone Encounter (Signed)
Left message for pt to call back for final results on LEV.

## 2012-09-26 ENCOUNTER — Encounter: Payer: Self-pay | Admitting: Internal Medicine

## 2012-09-30 ENCOUNTER — Other Ambulatory Visit: Payer: Self-pay | Admitting: Orthopedic Surgery

## 2012-09-30 DIAGNOSIS — M542 Cervicalgia: Secondary | ICD-10-CM

## 2012-10-01 ENCOUNTER — Ambulatory Visit
Admission: RE | Admit: 2012-10-01 | Discharge: 2012-10-01 | Disposition: A | Discharge status: Home / Self Care | Payer: Medicare Other | Source: Ambulatory Visit | Attending: Orthopedic Surgery | Admitting: Orthopedic Surgery

## 2012-10-01 DIAGNOSIS — M542 Cervicalgia: Secondary | ICD-10-CM

## 2012-10-16 ENCOUNTER — Other Ambulatory Visit: Payer: Self-pay | Admitting: Cardiology

## 2012-10-16 MED ORDER — AMLODIPINE BESYLATE 2.5 MG PO TABS: 2.5000 mg | ORAL_TABLET | Freq: Two times a day (BID) | ORAL | Status: DC

## 2012-10-21 ENCOUNTER — Ambulatory Visit: Payer: Medicare Other | Admitting: Cardiology

## 2012-11-07 ENCOUNTER — Encounter (HOSPITAL_COMMUNITY): Payer: Self-pay | Admitting: Emergency Medicine

## 2012-11-07 ENCOUNTER — Emergency Department (HOSPITAL_COMMUNITY): Payer: Medicare Other

## 2012-11-07 ENCOUNTER — Emergency Department (HOSPITAL_COMMUNITY)
Admission: EM | Admit: 2012-11-07 | Discharge: 2012-11-07 | Disposition: A | Discharge status: Home / Self Care | Payer: Medicare Other | Attending: Emergency Medicine | Admitting: Emergency Medicine

## 2012-11-07 DIAGNOSIS — Z87891 Personal history of nicotine dependence: Secondary | ICD-10-CM | POA: Insufficient documentation

## 2012-11-07 DIAGNOSIS — E785 Hyperlipidemia, unspecified: Secondary | ICD-10-CM | POA: Insufficient documentation

## 2012-11-07 DIAGNOSIS — I2510 Atherosclerotic heart disease of native coronary artery without angina pectoris: Secondary | ICD-10-CM | POA: Insufficient documentation

## 2012-11-07 DIAGNOSIS — Z87448 Personal history of other diseases of urinary system: Secondary | ICD-10-CM | POA: Insufficient documentation

## 2012-11-07 DIAGNOSIS — Z951 Presence of aortocoronary bypass graft: Secondary | ICD-10-CM | POA: Insufficient documentation

## 2012-11-07 DIAGNOSIS — Y9389 Activity, other specified: Secondary | ICD-10-CM | POA: Insufficient documentation

## 2012-11-07 DIAGNOSIS — Y9289 Other specified places as the place of occurrence of the external cause: Secondary | ICD-10-CM | POA: Insufficient documentation

## 2012-11-07 DIAGNOSIS — T148XXA Other injury of unspecified body region, initial encounter: Secondary | ICD-10-CM

## 2012-11-07 DIAGNOSIS — X500XXA Overexertion from strenuous movement or load, initial encounter: Secondary | ICD-10-CM | POA: Insufficient documentation

## 2012-11-07 DIAGNOSIS — G4700 Insomnia, unspecified: Secondary | ICD-10-CM | POA: Insufficient documentation

## 2012-11-07 DIAGNOSIS — Z7982 Long term (current) use of aspirin: Secondary | ICD-10-CM | POA: Insufficient documentation

## 2012-11-07 DIAGNOSIS — I4891 Unspecified atrial fibrillation: Secondary | ICD-10-CM | POA: Insufficient documentation

## 2012-11-07 DIAGNOSIS — I10 Essential (primary) hypertension: Secondary | ICD-10-CM | POA: Insufficient documentation

## 2012-11-07 DIAGNOSIS — IMO0002 Reserved for concepts with insufficient information to code with codable children: Secondary | ICD-10-CM | POA: Insufficient documentation

## 2012-11-07 DIAGNOSIS — M4800 Spinal stenosis, site unspecified: Secondary | ICD-10-CM | POA: Insufficient documentation

## 2012-11-07 DIAGNOSIS — I509 Heart failure, unspecified: Secondary | ICD-10-CM | POA: Insufficient documentation

## 2012-11-07 DIAGNOSIS — K219 Gastro-esophageal reflux disease without esophagitis: Secondary | ICD-10-CM | POA: Insufficient documentation

## 2012-11-07 NOTE — ED Notes (Signed)
Patient was raking leaves about a week ago and is hurting underneath his right arm.  Patient rates pain 10/10 that worsens with movement.

## 2012-11-07 NOTE — ED Provider Notes (Signed)
History     CSN: [id number]  Arrival date & time 11/07/12  1115   First MD Initiated Contact with Patient 11/07/12 1237      Chief Complaint  Patient presents with  . Shoulder Pain    (Consider location/radiation/quality/duration/timing/severity/associated sxs/prior treatment) HPI  Patient presents to the emergency department for evaluation of his right shoulder pain. He says that he was raking leaves around a week ago and the very next day he noticed that arm really started to hurt him. He states as time went on his arm became increasingly painful. Now anytime he tries to move that arm it hurts. When he doesn't move it at all he has absolutely no pain. He can reproduce the pain and push him a certain spot causing the pain to be exacerbated. Otherwise he is in no acute distress and vital signs stable.  Past Medical History  Diagnosis Date  . A-fib     paroxysmal, was on coumadin , pt desired to stop it , no longer a candidate per cards  . Tachy-brady syndrome   . CAD (coronary artery disease)     MI angioplasty 80, s/p CABG 01/2006- pacemaker  (12-2006), NSTMI  7-11  . CHF (congestive heart failure)   . Hyperlipidemia   . Hypertension   . Spinal stenosis      has seen Dr Ethelene Hal before, s/p local injection which helped x a while   . GERD (gastroesophageal reflux disease)   . RLS (restless legs syndrome)     ? of  . Vitamin B12 deficiency   . Insomnia   . History of BPH     TURP '96 at [facility] 96, urethoplast 97 ( History of bladder outlet obstruction, chronic incontinence)    Past Surgical History  Procedure Date  . Coronary artery bypass graft   . Pacemaker insertion     12-2006 PPM Medtronic  . Turp vaporization     x 2  . Inguinal hernia repair     Right  . Replacement total knee 2006    Left  . Carpal tunnel release     bilateral    Family History  Problem Relation Age of Onset  . Heart disease Neg Hx   . Diabetes Neg Hx     History  Substance Use Topics  .  Smoking status: Former Games developer  . Smokeless tobacco: Not on file  . Alcohol Use: No      Review of Systems  Review of Systems  Gen: no weight loss, fevers, chills, night sweats  Neck: no neck pain  Lungs:No wheezing, coughing or hemoptysis CV: no chest pain, palpitations, dependent edema or orthopnea  Abd: no abdominal pain, nausea, vomiting  GU: no dysuria or gross hematuria  MSK:  Right shoulder injury Neuro: no headache, no focal neurologic deficits  Skin: no abnormalities Psyche: negative.   Allergies  Atorvastatin  Home Medications   Current Outpatient Rx  Name  Route  Sig  Dispense  Refill  . AMLODIPINE BESYLATE 2.5 MG PO TABS   Oral   Take 1 tablet (2.5 mg total) by mouth 2 (two) times daily.   60 tablet   5   . ASPIRIN 81 MG PO TABS   Oral   Take 81 mg by mouth daily.          . B COMPLEX PO TABS   Oral   Take 1 tablet by mouth daily.          . OCUVITE PO  TABS   Oral   Take 1 tablet by mouth daily.         Marland Kitchen CARVEDILOL 12.5 MG PO TABS   Oral   Take 6.25 mg by mouth 2 (two) times daily with a meal.         . CLOPIDOGREL BISULFATE 75 MG PO TABS   Oral   Take 75 mg by mouth daily.         Marland Kitchen DOCUSATE SODIUM 100 MG PO CAPS   Oral   Take 1 capsule (100 mg total) by mouth every 12 (twelve) hours.   60 capsule   0   . HYDROCODONE-ACETAMINOPHEN 5-500 MG PO TABS   Oral   Take 1-2 tablets by mouth at bedtime as needed for pain.   20 tablet   0   . DOXYCYCLINE HYCLATE 100 MG PO TABS   Oral   Take 1 tablet (100 mg total) by mouth 2 (two) times daily.   14 tablet   0   . MAGNESIUM CITRATE PO SOLN   Oral   Take 300 mLs by mouth once.   300 mL   0     BP 136/76  Pulse 65  Temp 97.9 F (36.6 C) (Oral)  Resp 18  SpO2 100%  Physical Exam  Nursing note and vitals reviewed. Constitutional: He appears well-developed and well-nourished. No distress.  HENT:  Head: Normocephalic and atraumatic.  Eyes: Pupils are equal, round, and  reactive to light.  Neck: Normal range of motion. Neck supple.  Cardiovascular: Normal rate and regular rhythm.   Pulmonary/Chest: Effort normal.  Abdominal: Soft.  Musculoskeletal:       Right shoulder: He exhibits decreased range of motion, tenderness, pain and spasm. He exhibits no bony tenderness, no swelling, no effusion, no crepitus, no deformity, no laceration, normal pulse and normal strength.  Neurological: He is alert.  Skin: Skin is warm and dry.    ED Course  Procedures (including critical care time)  Labs Reviewed - No data to display Dg Shoulder Right  11/07/2012  *RADIOLOGY REPORT*  Clinical Data: Pain worsened with movement  RIGHT SHOULDER - 2+ VIEW  Comparison: None.  Findings: Three views of the right shoulder submitted.  No acute fracture or subluxation.  Moderate degenerative changes right AC joint.  Dystrophic calcifications are noted just above the right AC joint.  Spurring of distal clavicle.  IMPRESSION: No acute fracture or subluxation.  Moderate degenerative changes right AC joint.   Original Report Authenticated By: Natasha Mead, M.D.      1. Muscle strain       MDM  [facility] x-ray is negative for any acute abnormality. 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 Dr. Effie Shy has examined the patient as well and agrees with my findings. Patient is to start a regimen of ibuprofen and followup with his primary care Dr. Allen Kell also been advised to rest arm as much as possible.  Pt has been advised of the symptoms that warrant their return to the ED. Patient has voiced understanding and has agreed to follow-up with the PCP or specialist.         Dorthula Matas, PA 11/07/12 1343

## 2012-11-10 NOTE — ED Provider Notes (Signed)
Medical screening examination/treatment/procedure(s) were performed by non-physician practitioner and as supervising physician I was immediately available for consultation/collaboration.  Flint Melter, MD 11/10/12 [phone]

## 2012-11-11 ENCOUNTER — Other Ambulatory Visit: Payer: Self-pay

## 2012-11-11 MED ORDER — CLOPIDOGREL BISULFATE 75 MG PO TABS: 75.0000 mg | ORAL_TABLET | Freq: Every day | ORAL | Status: DC

## 2012-11-24 ENCOUNTER — Encounter: Payer: Self-pay | Admitting: Internal Medicine

## 2012-11-24 ENCOUNTER — Ambulatory Visit (INDEPENDENT_AMBULATORY_CARE_PROVIDER_SITE_OTHER): Payer: Medicare Other | Admitting: *Deleted

## 2012-11-24 DIAGNOSIS — Z950 Presence of cardiac pacemaker: Secondary | ICD-10-CM

## 2012-11-24 DIAGNOSIS — I495 Sick sinus syndrome: Secondary | ICD-10-CM

## 2012-12-01 LAB — REMOTE PACEMAKER DEVICE
AL IMPEDENCE PM: 420 Ohm
AL THRESHOLD: 0.5 V
BATTERY VOLTAGE: 2.75 V
RV LEAD IMPEDENCE PM: 425 Ohm

## 2012-12-05 ENCOUNTER — Other Ambulatory Visit: Payer: Self-pay | Admitting: Cardiology

## 2012-12-05 MED ORDER — CLOPIDOGREL BISULFATE 75 MG PO TABS: 75.0000 mg | ORAL_TABLET | Freq: Every day | ORAL | Status: DC

## 2012-12-09 ENCOUNTER — Encounter: Payer: Self-pay | Admitting: *Deleted

## 2012-12-23 ENCOUNTER — Encounter: Payer: Self-pay | Admitting: Cardiology

## 2012-12-23 ENCOUNTER — Ambulatory Visit (INDEPENDENT_AMBULATORY_CARE_PROVIDER_SITE_OTHER): Payer: Medicare Other | Admitting: Cardiology

## 2012-12-23 VITALS — BP 160/86 | HR 60 | Ht 70.0 in | Wt 213.0 lb

## 2012-12-23 DIAGNOSIS — I2510 Atherosclerotic heart disease of native coronary artery without angina pectoris: Secondary | ICD-10-CM

## 2012-12-23 DIAGNOSIS — I495 Sick sinus syndrome: Secondary | ICD-10-CM

## 2012-12-23 DIAGNOSIS — I4891 Unspecified atrial fibrillation: Secondary | ICD-10-CM

## 2012-12-23 DIAGNOSIS — I10 Essential (primary) hypertension: Secondary | ICD-10-CM

## 2012-12-23 NOTE — Assessment & Plan Note (Signed)
No clinical episodes.

## 2012-12-23 NOTE — Assessment & Plan Note (Signed)
Patient is concerned about his edema. We will hold amlodipine. They will check her blood pressures at home. If they remain elevated, he is to increase his carvedilol to 1 tablet or 12.5 mg twice daily. I mentioned to them that this might be too much. He is to keep a close eye on his blood pressures. We went over this multiple times to make sure that both of them understood them, and we also were making sure that they had a specific instructions to know what to do.

## 2012-12-23 NOTE — Patient Instructions (Addendum)
Your physician recommends that you schedule a follow-up appointment in: 6 weeks with Dr. Riley Kill.  Stop Amlodipine.  Check Blood Pressure twice daily, once in the morning and again in the evening.  If your blood pressure is elevated, increase Coreg to 1 whole tablet twice daily.

## 2012-12-23 NOTE — Assessment & Plan Note (Signed)
No chest pain

## 2012-12-23 NOTE — Assessment & Plan Note (Signed)
Paced  

## 2012-12-23 NOTE — Progress Notes (Signed)
HPI:  The patient is doing well overall. His main complaint is fairly marked swelling in the feet. They wonder whether this is from the medication we talked about various strategies today.  No chest pain.  No fast rhythms.  No syncope or presyncope.    Current Outpatient Prescriptions  Medication Sig Dispense Refill  . amLODipine (NORVASC) 2.5 MG tablet Take 1 tablet (2.5 mg total) by mouth 2 (two) times daily.  60 tablet  5  . aspirin 81 MG tablet Take 81 mg by mouth daily.       Marland Kitchen b complex vitamins tablet Take 1 tablet by mouth daily.       . beta carotene w/minerals (OCUVITE) tablet Take 1 tablet by mouth daily.      . carvedilol (COREG) 12.5 MG tablet Take 6.25 mg by mouth 2 (two) times daily with a meal.      . clopidogrel (PLAVIX) 75 MG tablet Take 1 tablet (75 mg total) by mouth daily.  30 tablet  10    Allergies  Allergen Reactions  . Atorvastatin     REACTION: leg pain    Past Medical History  Diagnosis Date  . A-fib     paroxysmal, was on coumadin , pt desired to stop it , no longer a candidate per cards  . Tachy-brady syndrome   . CAD (coronary artery disease)     MI angioplasty 89, s/p CABG 01/2006- pacemaker  (12-2006), NSTMI  7-11  . CHF (congestive heart failure)   . Hyperlipidemia   . Hypertension   . Spinal stenosis      has seen Dr Ethelene Hal before, s/p local injection which helped x a while   . GERD (gastroesophageal reflux disease)   . RLS (restless legs syndrome)     ? of  . Vitamin B12 deficiency   . Insomnia   . History of BPH     TURP '96 at [facility] 96, urethoplast 97 ( History of bladder outlet obstruction, chronic incontinence)    Past Surgical History  Procedure Date  . Coronary artery bypass graft   . Pacemaker insertion     12-2006 PPM Medtronic  . Turp vaporization     x 2  . Inguinal hernia repair     Right  . Replacement total knee 2006    Left  . Carpal tunnel release     bilateral    Family History  Problem Relation Age of Onset  .  Heart disease Neg Hx   . Diabetes Neg Hx     History   Social History  . Marital Status: Married    Spouse Name: N/A    Number of Children: N/A  . Years of Education: N/A   Occupational History  . Not on file.   Social History Main Topics  . Smoking status: Former Games developer  . Smokeless tobacco: Not on file  . Alcohol Use: No  . Drug Use:   . Sexually Active:    Other Topics Concern  . Not on file   Social History Narrative  . No narrative on file    ROS: Please see the HPI.  All other systems reviewed and negative.  PHYSICAL EXAM:  BP 160/86  Pulse 60  Ht 5\' 10"  (1.778 m)  Wt 213 lb (96.616 kg)  BMI 30.56 kg/m2  SpO2 99%  General: Well developed, well nourished, in no acute distress. Head:  Normocephalic and atraumatic. Neck: no JVD Lungs: Clear to auscultation and percussion. Heart:  Normal S1 and S2.  No murmur, rubs or gallops.  Abdomen:  Normal bowel sounds; soft; non tender; no organomegaly Pulses: Pulses normal in all 4 extremities. Extremities: No clubbing or cyanosis. Two plus LE edema.   Neurologic: Alert and oriented x 3.  EKG::  AV pacing  ASSESSMENT AND PLAN:

## 2013-01-09 ENCOUNTER — Telehealth: Payer: Self-pay | Admitting: Cardiology

## 2013-01-09 MED ORDER — CARVEDILOL 12.5 MG PO TABS: 12.5000 mg | ORAL_TABLET | Freq: Two times a day (BID) | ORAL | Status: DC

## 2013-01-09 NOTE — Telephone Encounter (Signed)
Returned patient's call for carvedilol 12.5mg  refill. Patient states he is taking 12.5mg  twice daily by mouth as Dr Riley Kill asked.  rx for Carvedilol 12.5mg  twice daily by mouth #60 with 1 refill just incase Dr Riley Kill changes dosage at next appointment to [facility] in [place] on [facility].   Micki Riley,  CMA

## 2013-01-09 NOTE — Telephone Encounter (Signed)
New Problem:    Patient called in needing a refill of his carvedilol (COREG) 12.5 MG tablet.

## 2013-02-03 ENCOUNTER — Encounter: Payer: Self-pay | Admitting: Cardiology

## 2013-02-03 ENCOUNTER — Ambulatory Visit (INDEPENDENT_AMBULATORY_CARE_PROVIDER_SITE_OTHER): Payer: Medicare Other | Admitting: Cardiology

## 2013-02-03 VITALS — BP 150/96 | HR 62 | Ht 69.0 in | Wt 218.0 lb

## 2013-02-03 DIAGNOSIS — I4891 Unspecified atrial fibrillation: Secondary | ICD-10-CM

## 2013-02-03 DIAGNOSIS — I2510 Atherosclerotic heart disease of native coronary artery without angina pectoris: Secondary | ICD-10-CM

## 2013-02-03 DIAGNOSIS — D649 Anemia, unspecified: Secondary | ICD-10-CM

## 2013-02-03 DIAGNOSIS — I10 Essential (primary) hypertension: Secondary | ICD-10-CM

## 2013-02-03 DIAGNOSIS — I4892 Unspecified atrial flutter: Secondary | ICD-10-CM

## 2013-02-03 LAB — CBC WITH DIFFERENTIAL/PLATELET
Basophils Absolute: 0 10*3/uL (ref 0.0–0.1)
Eosinophils Absolute: 0.3 10*3/uL (ref 0.0–0.7)
HCT: 36 % — ABNORMAL LOW (ref 39.0–52.0)
Hemoglobin: 12 g/dL — ABNORMAL LOW (ref 13.0–17.0)
Lymphocytes Relative: 20 % (ref 12.0–46.0)
Lymphs Abs: 1.4 10*3/uL (ref 0.7–4.0)
MCHC: 33.4 g/dL (ref 30.0–36.0)
Neutro Abs: 4.7 10*3/uL (ref 1.4–7.7)
RDW: 14.4 % (ref 11.5–14.6)

## 2013-02-03 LAB — BASIC METABOLIC PANEL
BUN: 15 mg/dL (ref 6–23)
CO2: 29 mEq/L (ref 19–32)
Calcium: 8.9 mg/dL (ref 8.4–10.5)
Glucose, Bld: 98 mg/dL (ref 70–99)
Sodium: 143 mEq/L (ref 135–145)

## 2013-02-03 MED ORDER — POTASSIUM CHLORIDE ER 10 MEQ PO TBCR: 10.0000 meq | EXTENDED_RELEASE_TABLET | Freq: Every day | ORAL | Status: DC

## 2013-02-03 MED ORDER — HYDROCHLOROTHIAZIDE 12.5 MG PO CAPS: 12.5000 mg | ORAL_CAPSULE | Freq: Every day | ORAL | Status: DC

## 2013-02-03 MED ORDER — RIVAROXABAN 15 MG PO TABS: 15.0000 mg | ORAL_TABLET | Freq: Every day | ORAL | Status: DC

## 2013-02-03 NOTE — Progress Notes (Signed)
HPI:  He comes in for follow up today.  He does not have much energy overall, but he does laugh about it.  He remains pretty good overall, but is 79 years of age.  Notably, today he is in atrial flutter.    Current Outpatient Prescriptions  Medication Sig Dispense Refill  . aspirin 81 MG tablet Take 81 mg by mouth daily.       Marland Kitchen b complex vitamins tablet Take 1 tablet by mouth daily.       . beta carotene w/minerals (OCUVITE) tablet Take 1 tablet by mouth daily.      . carvedilol (COREG) 12.5 MG tablet Take 1 tablet (12.5 mg total) by mouth 2 (two) times daily with a meal.  60 tablet  1  . hydrochlorothiazide (MICROZIDE) 12.5 MG capsule Take 1 capsule (12.5 mg total) by mouth daily.  30 capsule  6  . potassium chloride (K-DUR) 10 MEQ tablet Take 1 tablet (10 mEq total) by mouth daily.  30 tablet  6  . Rivaroxaban (XARELTO) 15 MG TABS tablet Take 1 tablet (15 mg total) by mouth daily. Take with food  30 tablet  6   No current facility-administered medications for this visit.    Allergies  Allergen Reactions  . Atorvastatin     REACTION: leg pain    Past Medical History  Diagnosis Date  . A-fib     paroxysmal, was on coumadin , pt desired to stop it , no longer a candidate per cards  . Tachy-brady syndrome   . CAD (coronary artery disease)     MI angioplasty 1, s/p CABG 01/2006- pacemaker  (12-2006), NSTMI  7-11  . CHF (congestive heart failure)   . Hyperlipidemia   . Hypertension   . Spinal stenosis      has seen Dr Ethelene Hal before, s/p local injection which helped x a while   . GERD (gastroesophageal reflux disease)   . RLS (restless legs syndrome)     ? of  . Vitamin B12 deficiency   . Insomnia   . History of BPH     TURP '96 at [facility] 96, urethoplast 97 ( History of bladder outlet obstruction, chronic incontinence)    Past Surgical History  Procedure Laterality Date  . Coronary artery bypass graft    . Pacemaker insertion      12-2006 PPM Medtronic  . Turp vaporization       x 2  . Inguinal hernia repair      Right  . Replacement total knee  2006    Left  . Carpal tunnel release      bilateral    Family History  Problem Relation Age of Onset  . Heart disease Neg Hx   . Diabetes Neg Hx     History   Social History  . Marital Status: Married    Spouse Name: N/A    Number of Children: N/A  . Years of Education: N/A   Occupational History  . Not on file.   Social History Main Topics  . Smoking status: Former Games developer  . Smokeless tobacco: Not on file  . Alcohol Use: No  . Drug Use:   . Sexually Active:    Other Topics Concern  . Not on file   Social History Narrative  . No narrative on file    ROS: Please see the HPI.  All other systems reviewed and negative.  PHYSICAL EXAM:  BP 150/96  Pulse 62  Ht 5'  9" (1.753 m)  Wt 218 lb (98.884 kg)  BMI 32.18 kg/m2  SpO2 95%  General: Well developed, well nourished, in no acute distress. Head:  Normocephalic and atraumatic. Neck: no JVD Lungs: Clear to auscultation and percussion. Heart: Normal S1 and S2.  No murmur, rubs or gallops.  Abdomen:  Normal bowel sounds; soft; non tender; no organomegaly Pulses: Pulses normal in all 4 extremities. Extremities: No clubbing or cyanosis.2 plus edema Neurologic: Alert and oriented x 3.  EKG:  Atrial flutter with backup v pacing.    ASSESSMENT AND PLAN: Visit in excess of 40 minutes.

## 2013-02-03 NOTE — Assessment & Plan Note (Signed)
He appears to be in a stable flutter rhythm.  Notably, he is not having trouble today.  He does have some lower extremity edema.  I have reviewed his situation in detail with the patient and his wife.  He will initiate Xarelto in two days after stopping plavix now.  I will start with the lower dose given his borderline renal function, and advanced age.  He will hold plavix for now.  We will bring him back early next week.  He has had some hemmorhoidal bleeding in the past------very mild only, but he knows to stop if it becomes of any significance.  Of note, he is on DAPT which will be stopped.

## 2013-02-03 NOTE — Assessment & Plan Note (Signed)
Modest elevation of pressure combined with some LE edema. Will add low dose HCTZ and combine with some KCL replacement, and reassess early next week.  Labs today as well.

## 2013-02-03 NOTE — Assessment & Plan Note (Signed)
Recheck CBC to ensure stability for lower dose Xarelto.

## 2013-02-03 NOTE — Patient Instructions (Signed)
Your physician has recommended you make the following change in your medication: STOP your Plavix,  START Xarelto 15 mg once daily with food on Thursday, START HCTZ 12.5 mg daily, START Potassium 10 meq daily  Your physician recommends that you return for lab work in: today (hepatic panel, cbc, tsh, bmet)  Your physician recommends that you schedule a follow-up appointment in: Tuesday  STOP your Xarelto if you notice any bleeding after starting it

## 2013-02-05 ENCOUNTER — Telehealth: Payer: Self-pay | Admitting: Cardiology

## 2013-02-05 NOTE — Telephone Encounter (Signed)
Pt started xarelto today and has some bleeding and was told to call if thei happen once he started medication

## 2013-02-05 NOTE — Telephone Encounter (Signed)
I spoke with the pt's wife and the pt took his first dose of Xarelto this morning and has developed bleeding.  The pt just had a bowel movement and noticed blood in his stool. Per Dr Riley Kill the pt should STOP Xarelto now and keep follow-up appointment scheduled for next week on 02/10/13.  If the pt continues to have blood in his BM or an increased amount then he will contact our office.

## 2013-02-10 ENCOUNTER — Ambulatory Visit (INDEPENDENT_AMBULATORY_CARE_PROVIDER_SITE_OTHER): Payer: Medicare Other | Admitting: Cardiology

## 2013-02-10 ENCOUNTER — Encounter: Payer: Self-pay | Admitting: Cardiology

## 2013-02-10 VITALS — BP 124/69 | HR 65 | Ht 69.0 in | Wt 217.0 lb

## 2013-02-10 DIAGNOSIS — I2510 Atherosclerotic heart disease of native coronary artery without angina pectoris: Secondary | ICD-10-CM

## 2013-02-10 DIAGNOSIS — I4892 Unspecified atrial flutter: Secondary | ICD-10-CM

## 2013-02-10 NOTE — Progress Notes (Signed)
HPI:  He returns in follow up.  He went home on antithrombin treatment, and got home and developed problems with bleeding.  He had blood in the commode around the edges from hemorrhoidal bleeding.  The bleeding has stopped and the patient is not short of breath.  Quite skeptical of treatment.    Current Outpatient Prescriptions  Medication Sig Dispense Refill  . aspirin 81 MG tablet Take 81 mg by mouth daily.       Marland Kitchen b complex vitamins tablet Take 1 tablet by mouth daily.       . beta carotene w/minerals (OCUVITE) tablet Take 1 tablet by mouth daily.      . carvedilol (COREG) 12.5 MG tablet Take 1 tablet (12.5 mg total) by mouth 2 (two) times daily with a meal.  60 tablet  1  . hydrochlorothiazide (MICROZIDE) 12.5 MG capsule Take 1 capsule (12.5 mg total) by mouth daily.  30 capsule  6  . potassium chloride (K-DUR) 10 MEQ tablet Take 1 tablet (10 mEq total) by mouth daily.  30 tablet  6   No current facility-administered medications for this visit.    Allergies  Allergen Reactions  . Atorvastatin     REACTION: leg pain    Past Medical History  Diagnosis Date  . A-fib     paroxysmal, was on coumadin , pt desired to stop it , no longer a candidate per cards  . Tachy-brady syndrome   . CAD (coronary artery disease)     MI angioplasty 41, s/p CABG 01/2006- pacemaker  (12-2006), NSTMI  7-11  . CHF (congestive heart failure)   . Hyperlipidemia   . Hypertension   . Spinal stenosis      has seen Dr Ethelene Hal before, s/p local injection which helped x a while   . GERD (gastroesophageal reflux disease)   . RLS (restless legs syndrome)     ? of  . Vitamin B12 deficiency   . Insomnia   . History of BPH     TURP '96 at [facility] 96, urethoplast 97 ( History of bladder outlet obstruction, chronic incontinence)    Past Surgical History  Procedure Laterality Date  . Coronary artery bypass graft    . Pacemaker insertion      12-2006 PPM Medtronic  . Turp vaporization      x 2  . Inguinal  hernia repair      Right  . Replacement total knee  2006    Left  . Carpal tunnel release      bilateral    Family History  Problem Relation Age of Onset  . Heart disease Neg Hx   . Diabetes Neg Hx     History   Social History  . Marital Status: Married    Spouse Name: N/A    Number of Children: N/A  . Years of Education: N/A   Occupational History  . Not on file.   Social History Main Topics  . Smoking status: Former Games developer  . Smokeless tobacco: Not on file  . Alcohol Use: No  . Drug Use:   . Sexually Active:    Other Topics Concern  . Not on file   Social History Narrative  . No narrative on file    ROS: Please see the HPI.  All other systems reviewed and negative.  PHYSICAL EXAM:  BP 124/69  Pulse 65  Ht 5\' 9"  (1.753 m)  Wt 217 lb (98.431 kg)  BMI 32.03 kg/m2  SpO2  95%  General: Well developed, well nourished, in no acute distress. Head:  Normocephalic and atraumatic. Neck: no JVD Lungs: Clear to auscultation and percussion. Heart:   No definite murmur.  Normal S1 and S2.   Pulses: Pulses normal in all 4 extremities. Neurologic: Alert and oriented x 3.  EKG:  Atrial flutter. Paced rhythm.    ASSESSMENT AND PLAN:  Case reviewed with Dr. Graciela Husbands.

## 2013-02-10 NOTE — Patient Instructions (Addendum)
Your physician recommends that you continue on your current medications as directed. Please refer to the Current Medication list given to you today.  Your physician recommends that you schedule a follow-up appointment in: 1 month with Dr. Riley Kill and Dr. Graciela Husbands.

## 2013-02-14 NOTE — Assessment & Plan Note (Signed)
No current symptoms

## 2013-02-14 NOTE — Assessment & Plan Note (Addendum)
Very difficult situation.  Developed some hemorrhoidal bleeding and then stopped his antithrombin treatment.  Now off.  Will need to monitor, but his stroke risk is increased.  However, he has had some bleeding.  Will perhaps need a week or two off, and could try lower dose apixaban for a bit and see how he does.  His rate is controlled.

## 2013-02-27 ENCOUNTER — Telehealth: Payer: Self-pay | Admitting: Cardiology

## 2013-02-27 NOTE — Telephone Encounter (Signed)
I spoke with the pt and he states that he weighed 190 lbs before his carvedilol was increased (this was done at 12/23/12 office visit--weight was 213 lbs).  The pt's weight today was 210 lbs.  I reviewed the pt's chart and in October 2013 he weighted 209 lbs. At his most recent visit 02/10/13 his weight was 217 lbs.  The pt denies SOB but during the winter months he did have to buy pants a size bigger.  I made the pt aware that it is not unusual to gain a few pounds in the winter due to decreased activity and eating comfort foods.  The pt agrees that it has been months since he has been able to get outside and walk. I instructed the pt to weigh himself daily and document these readings.  The pt is scheduled to see Dr Riley Kill 03/17/13. I instructed the pt to call our office if he gains an additional 3 lbs prior to his appointment. The pt verbalized understanding.  The pt is going to try and get out and walk when the weather is better.

## 2013-02-27 NOTE — Telephone Encounter (Signed)
New problem   [name] stated pt has gain 20lbs with the medication he is taking and want to know if his meds can be changed. Please call pt's wife concerning this matter

## 2013-03-02 ENCOUNTER — Encounter: Payer: Self-pay | Admitting: Internal Medicine

## 2013-03-02 ENCOUNTER — Ambulatory Visit (INDEPENDENT_AMBULATORY_CARE_PROVIDER_SITE_OTHER): Payer: Medicare Other | Admitting: *Deleted

## 2013-03-02 DIAGNOSIS — I495 Sick sinus syndrome: Secondary | ICD-10-CM

## 2013-03-02 DIAGNOSIS — Z950 Presence of cardiac pacemaker: Secondary | ICD-10-CM

## 2013-03-06 ENCOUNTER — Encounter (HOSPITAL_COMMUNITY): Payer: Self-pay

## 2013-03-06 ENCOUNTER — Emergency Department (HOSPITAL_COMMUNITY): Payer: Medicare Other

## 2013-03-06 ENCOUNTER — Telehealth: Payer: Self-pay | Admitting: Cardiology

## 2013-03-06 ENCOUNTER — Emergency Department (HOSPITAL_COMMUNITY)
Admission: EM | Admit: 2013-03-06 | Discharge: 2013-03-06 | Disposition: A | Discharge status: Home / Self Care | Payer: Medicare Other | Attending: Emergency Medicine | Admitting: Emergency Medicine

## 2013-03-06 DIAGNOSIS — Z951 Presence of aortocoronary bypass graft: Secondary | ICD-10-CM | POA: Insufficient documentation

## 2013-03-06 DIAGNOSIS — Z862 Personal history of diseases of the blood and blood-forming organs and certain disorders involving the immune mechanism: Secondary | ICD-10-CM | POA: Insufficient documentation

## 2013-03-06 DIAGNOSIS — I4891 Unspecified atrial fibrillation: Secondary | ICD-10-CM | POA: Insufficient documentation

## 2013-03-06 DIAGNOSIS — Z950 Presence of cardiac pacemaker: Secondary | ICD-10-CM | POA: Insufficient documentation

## 2013-03-06 DIAGNOSIS — Z96659 Presence of unspecified artificial knee joint: Secondary | ICD-10-CM | POA: Insufficient documentation

## 2013-03-06 DIAGNOSIS — Z87448 Personal history of other diseases of urinary system: Secondary | ICD-10-CM | POA: Insufficient documentation

## 2013-03-06 DIAGNOSIS — G4700 Insomnia, unspecified: Secondary | ICD-10-CM | POA: Insufficient documentation

## 2013-03-06 DIAGNOSIS — Z8669 Personal history of other diseases of the nervous system and sense organs: Secondary | ICD-10-CM | POA: Insufficient documentation

## 2013-03-06 DIAGNOSIS — I2510 Atherosclerotic heart disease of native coronary artery without angina pectoris: Secondary | ICD-10-CM | POA: Insufficient documentation

## 2013-03-06 DIAGNOSIS — I10 Essential (primary) hypertension: Secondary | ICD-10-CM | POA: Insufficient documentation

## 2013-03-06 DIAGNOSIS — Z8719 Personal history of other diseases of the digestive system: Secondary | ICD-10-CM | POA: Insufficient documentation

## 2013-03-06 DIAGNOSIS — Z8739 Personal history of other diseases of the musculoskeletal system and connective tissue: Secondary | ICD-10-CM | POA: Insufficient documentation

## 2013-03-06 DIAGNOSIS — Z8639 Personal history of other endocrine, nutritional and metabolic disease: Secondary | ICD-10-CM | POA: Insufficient documentation

## 2013-03-06 DIAGNOSIS — I509 Heart failure, unspecified: Secondary | ICD-10-CM | POA: Insufficient documentation

## 2013-03-06 DIAGNOSIS — Z9861 Coronary angioplasty status: Secondary | ICD-10-CM | POA: Insufficient documentation

## 2013-03-06 DIAGNOSIS — R0602 Shortness of breath: Secondary | ICD-10-CM | POA: Insufficient documentation

## 2013-03-06 DIAGNOSIS — I252 Old myocardial infarction: Secondary | ICD-10-CM | POA: Insufficient documentation

## 2013-03-06 DIAGNOSIS — Z87891 Personal history of nicotine dependence: Secondary | ICD-10-CM | POA: Insufficient documentation

## 2013-03-06 DIAGNOSIS — Z7982 Long term (current) use of aspirin: Secondary | ICD-10-CM | POA: Insufficient documentation

## 2013-03-06 DIAGNOSIS — Z8679 Personal history of other diseases of the circulatory system: Secondary | ICD-10-CM | POA: Insufficient documentation

## 2013-03-06 DIAGNOSIS — Z79899 Other long term (current) drug therapy: Secondary | ICD-10-CM | POA: Insufficient documentation

## 2013-03-06 LAB — BASIC METABOLIC PANEL
BUN: 23 mg/dL (ref 6–23)
Creatinine, Ser: 1.2 mg/dL (ref 0.50–1.35)
GFR calc Af Amer: 60 mL/min — ABNORMAL LOW (ref >90)
GFR calc non Af Amer: 52 mL/min — ABNORMAL LOW (ref >90)

## 2013-03-06 LAB — POCT I-STAT TROPONIN I: Troponin i, poc: 0.02 ng/mL (ref 0.00–0.08)

## 2013-03-06 LAB — CBC WITH DIFFERENTIAL/PLATELET
Basophils Absolute: 0 10*3/uL (ref 0.0–0.1)
Basophils Relative: 0 % (ref 0–1)
MCHC: 34.5 g/dL (ref 30.0–36.0)
Monocytes Absolute: 0.7 10*3/uL (ref 0.1–1.0)
Neutro Abs: 5 10*3/uL (ref 1.7–7.7)
Neutrophils Relative %: 69 % (ref 43–77)
Platelets: 162 10*3/uL (ref 150–400)
RDW: 13.7 % (ref 11.5–15.5)

## 2013-03-06 MED ORDER — FUROSEMIDE 20 MG PO TABS
20.0000 mg | ORAL_TABLET | Freq: Once | ORAL | Status: AC
  Administered 2013-03-06: 20 mg via ORAL
  Filled 2013-03-06: qty 1

## 2013-03-06 MED ORDER — FUROSEMIDE 20 MG PO TABS: 20.0000 mg | ORAL_TABLET | Freq: Two times a day (BID) | ORAL | Status: DC

## 2013-03-06 NOTE — Telephone Encounter (Signed)
New problem    Was told to call back - weight gain   C/o 3 lbs weight since yesterday . Day before gain 3 lbs   Weight 218.

## 2013-03-06 NOTE — Telephone Encounter (Signed)
Spoke with patient's wife. He has gained 11 pounds in 4 days and has swelling in the legs,feet, and abdomen. Only taking HCTZ 12.5 mg every day for fluid retention. No complaints of SOB. She is requesting an appointment to see Dr.Stuckey on Monday. Advised Mrs. Marmo to take him to the [facility] ER to be evaluated.

## 2013-03-06 NOTE — ED Notes (Signed)
Pt unable to provide urine sample.

## 2013-03-06 NOTE — ED Notes (Signed)
Pt with hx of CHF. States his legs have been swelling over the past 3 weeks and have 3+ pitting edema. His wife had called the doctor and told them to come here. Pt denies any SOB only states has trouble sleeping in a hot house at night. Denies any CP or any pain at this time.

## 2013-03-06 NOTE — ED Provider Notes (Signed)
History     CSN: [id number]  Arrival date & time 03/06/13  1757   First MD Initiated Contact with Patient 03/06/13 1851      Chief Complaint  Patient presents with  . Leg Swelling    (Consider location/radiation/quality/duration/timing/severity/associated sxs/prior treatment) HPI Comments: [name] is a 79 y.o. Male who states that his legs have been swelling, for 3 weeks. Is also concerned he has gained greater than 6 pounds in the last week. He follows his weight at home. One week ago. He was 207 pounds. Today he was 218 pounds. He states that he is eating well and urinating well. He has occasional shortness of breath, at night, because it feels like it is hot in his house. There are no known modifying factors. He's been taking his medications, as directed.  The history is provided by the patient and the spouse.    Past Medical History  Diagnosis Date  . A-fib     paroxysmal, was on coumadin , pt desired to stop it , no longer a candidate per cards  . Tachy-brady syndrome   . CAD (coronary artery disease)     MI angioplasty 35, s/p CABG 01/2006- pacemaker  (12-2006), NSTMI  7-11  . CHF (congestive heart failure)   . Hyperlipidemia   . Hypertension   . Spinal stenosis      has seen Dr Ethelene Hal before, s/p local injection which helped x a while   . GERD (gastroesophageal reflux disease)   . RLS (restless legs syndrome)     ? of  . Vitamin B12 deficiency   . Insomnia   . History of BPH     TURP '96 at [facility] 96, urethoplast 97 ( History of bladder outlet obstruction, chronic incontinence)    Past Surgical History  Procedure Laterality Date  . Coronary artery bypass graft    . Pacemaker insertion      12-2006 PPM Medtronic  . Turp vaporization      x 2  . Inguinal hernia repair      Right  . Replacement total knee  2006    Left  . Carpal tunnel release      bilateral    Family History  Problem Relation Age of Onset  . Heart disease Neg Hx   . Diabetes Neg Hx      History  Substance Use Topics  . Smoking status: Former Games developer  . Smokeless tobacco: Not on file  . Alcohol Use: No      Review of Systems  All other systems reviewed and are negative.    Allergies  Amlodipine; Atorvastatin; and Carvedilol  Home Medications   Current Outpatient Rx  Name  Route  Sig  Dispense  Refill  . acetaminophen (TYLENOL) 500 MG tablet   Oral   Take 500 mg by mouth daily as needed for pain.         Marland Kitchen aspirin EC 81 MG tablet   Oral   Take 81 mg by mouth daily.         Marland Kitchen b complex vitamins tablet   Oral   Take 1 tablet by mouth every evening.          . beta carotene w/minerals (OCUVITE) tablet   Oral   Take 1 tablet by mouth daily.         . carvedilol (COREG) 12.5 MG tablet   Oral   Take 1 tablet (12.5 mg total) by mouth 2 (two) times daily  with a meal.   60 tablet   1   . hydrochlorothiazide (MICROZIDE) 12.5 MG capsule   Oral   Take 1 capsule (12.5 mg total) by mouth daily.   30 capsule   6   . potassium chloride (K-DUR) 10 MEQ tablet   Oral   Take 10 mEq by mouth every evening.         . furosemide (LASIX) 20 MG tablet   Oral   Take 1 tablet (20 mg total) by mouth 2 (two) times daily.   5 tablet   0     BP 153/78  Pulse 74  Temp(Src) 97.5 F (36.4 C) (Oral)  Resp 24  Ht 5\' 9"  (1.753 m)  Wt 221 lb 8 oz (100.472 kg)  BMI 32.69 kg/m2  SpO2 100%  Physical Exam  Nursing note and vitals reviewed. Constitutional: He is oriented to person, place, and time. He appears well-developed and well-nourished.  HENT:  Head: Normocephalic and atraumatic.  Right Ear: External ear normal.  Left Ear: External ear normal.  Eyes: Conjunctivae and EOM are normal. Pupils are equal, round, and reactive to light.  Neck: Normal range of motion and phonation normal. Neck supple.  Cardiovascular: Normal rate, regular rhythm, normal heart sounds and intact distal pulses.   Pulmonary/Chest: Effort normal and breath sounds normal.  He exhibits no bony tenderness.  Abdominal: Soft. Normal appearance. There is no tenderness. There is no guarding.  Protuberant abdomen  Musculoskeletal: Normal range of motion. He exhibits edema (3+ bilateral lower extremity). He exhibits no tenderness.  Neurological: He is alert and oriented to person, place, and time. He has normal strength. No cranial nerve deficit or sensory deficit. He exhibits normal muscle tone. Coordination normal.  Skin: Skin is warm, dry and intact.  Psychiatric: He has a normal mood and affect. His behavior is normal. Judgment and thought content normal.    ED Course  Procedures (including critical care time)  Vitals with Age-Percentiles 02/03/2013 02/10/2013 03/06/2013 03/06/2013  Length 175.3 cm 175.3 cm 175.3 cm   Systolic 150 124 960   Diastolic 96 69 78   Pulse 62 65 60   Respiration   22   Weight 98.884 kg 98.431 kg 98.884 kg 100.472 kg  VISIT REPORT      Note: first weight today is stated, second is weighed.  Medications  furosemide (LASIX) tablet 20 mg (20 mg Oral Given 03/06/13 2109)      Date: 09/19/2012  Rate: 64  Rhythm: atrial flutter  QRS Axis: Left axis deviation  PR and QT Intervals: Normal QT  ST/T Wave abnormalities: normal  PR and QRS Conduction Disutrbances:Normal QT,  right bundle branch block  Narrative Interpretation:   Old EKG Reviewed: unchanged   Labs Reviewed  BASIC METABOLIC PANEL - Abnormal; Notable for the following:    GFR calc non Af Amer 52 (*)    GFR calc Af Amer 60 (*)    All other components within normal limits  CBC WITH DIFFERENTIAL - Abnormal; Notable for the following:    RBC 3.57 (*)    Hemoglobin 10.8 (*)    HCT 31.3 (*)    All other components within normal limits  PRO B NATRIURETIC PEPTIDE - Abnormal; Notable for the following:    Pro B Natriuretic peptide (BNP) 1704.0 (*)    All other components within normal limits  URINALYSIS, ROUTINE W REFLEX MICROSCOPIC  POCT I-STAT TROPONIN I   Dg Chest 2  View  03/06/2013  *RADIOLOGY  REPORT*  Clinical Data: Lower extremity edema and history of CHF.  CHEST - 2 VIEW  Comparison: 09/14/2011  Findings: Stable cardiomegaly and radiographic appearance of pacemaker.  The lungs show stable chronic disease and no evidence of overt edema.  No pleural effusions are identified.  Stable degenerative changes are present in the thoracic spine.  IMPRESSION: Stable cardiomegaly.  Chronic lung disease without evidence of pulmonary edema.   Original Report Authenticated By: Irish Lack, M.D.    Nursing Notes Reviewed/ Care Coordinated Applicable Imaging Reviewed. Radiologic imaging report reviewed and images by radiography  - viewed, by me. Interpretation of Laboratory Data incorporated into ED treatment  1. Congestive heart failure       MDM  CHF, with mild exacerbation. Pt on low dose HCTZ, currently. Will d/c on Lasix for 3 days, and have him recheck with PCP in 4 days, for BMET and reassessment. Doubt metabolic instability, serious bacterial infection or impending vascular collapse; the patient is stable for discharge.     Plan: Home Medications- Lasix; Home Treatments- rest; Recommended follow up- PCP for checkup in 4 days   Flint Melter, MD 03/08/13 [phone]

## 2013-03-06 NOTE — ED Notes (Signed)
Pt presents with swelling to bilateral lower extremities - onset unknown.  Pt has hx of heart failure.  Pt wife called PCP today and they instructed her to bring pt to ED.  Lung sounds diminished in all quadrants.  Pitting edema noted to bilateral lower extremities, pedal pulses present.  Pt denies any pain at this time.  Pt on cardiac monitor- will continue to monitor pt.

## 2013-03-09 ENCOUNTER — Telehealth: Payer: Self-pay | Admitting: Cardiology

## 2013-03-09 ENCOUNTER — Encounter: Payer: Medicare Other | Admitting: Physician Assistant

## 2013-03-09 NOTE — Telephone Encounter (Signed)
New Problem:    Patient's wife called in wanting her husband to be seen sooner than 03/11/13 at 12:10pm with Tereso Newcomer.  Please call back.

## 2013-03-09 NOTE — Telephone Encounter (Signed)
When the pt came into the office today for his appointment he refused to see a PA and left the office.  When I spoke with the pt this morning I made him aware that his appointment would be with a PA because Dr Riley Kill is out of the office this week and he accepted the appointment. I called and spoke with the pt's wife and apologized for the confusion.  I made her aware that our PA/NPs are highly competent in decision making and work along side our cardiologist on a daily basis.  At this time the pt refuses to make another appointment and will keep his appointment next week with Dr Riley Kill.  The pt's wife will contact the office with any other questions or concerns.

## 2013-03-09 NOTE — Telephone Encounter (Signed)
I spoke with the [name] and scheduled him to see Tereso Newcomer PA-C today at 11:10.

## 2013-03-10 ENCOUNTER — Other Ambulatory Visit: Payer: Self-pay | Admitting: *Deleted

## 2013-03-10 MED ORDER — CARVEDILOL 12.5 MG PO TABS: 12.5000 mg | ORAL_TABLET | Freq: Two times a day (BID) | ORAL | Status: DC

## 2013-03-11 ENCOUNTER — Encounter: Payer: Medicare Other | Admitting: Physician Assistant

## 2013-03-17 ENCOUNTER — Ambulatory Visit (INDEPENDENT_AMBULATORY_CARE_PROVIDER_SITE_OTHER): Payer: Medicare Other | Admitting: Cardiology

## 2013-03-17 ENCOUNTER — Encounter: Payer: Self-pay | Admitting: Cardiology

## 2013-03-17 VITALS — BP 146/86 | HR 73 | Ht 69.0 in | Wt 225.0 lb

## 2013-03-17 DIAGNOSIS — I4891 Unspecified atrial fibrillation: Secondary | ICD-10-CM

## 2013-03-17 DIAGNOSIS — I509 Heart failure, unspecified: Secondary | ICD-10-CM

## 2013-03-17 DIAGNOSIS — I2510 Atherosclerotic heart disease of native coronary artery without angina pectoris: Secondary | ICD-10-CM

## 2013-03-17 DIAGNOSIS — R55 Syncope and collapse: Secondary | ICD-10-CM

## 2013-03-17 DIAGNOSIS — D649 Anemia, unspecified: Secondary | ICD-10-CM

## 2013-03-17 DIAGNOSIS — I4892 Unspecified atrial flutter: Secondary | ICD-10-CM

## 2013-03-17 DIAGNOSIS — K625 Hemorrhage of anus and rectum: Secondary | ICD-10-CM

## 2013-03-17 LAB — BASIC METABOLIC PANEL
BUN: 27 mg/dL — ABNORMAL HIGH (ref 6–23)
CO2: 24 mEq/L (ref 19–32)
Calcium: 8.4 mg/dL (ref 8.4–10.5)
Glucose, Bld: 113 mg/dL — ABNORMAL HIGH (ref 70–99)
Sodium: 136 mEq/L (ref 135–145)

## 2013-03-17 LAB — CBC WITH DIFFERENTIAL/PLATELET
Basophils Absolute: 0 10*3/uL (ref 0.0–0.1)
Eosinophils Absolute: 0.4 10*3/uL (ref 0.0–0.7)
HCT: 29.7 % — ABNORMAL LOW (ref 39.0–52.0)
Hemoglobin: 10.2 g/dL — ABNORMAL LOW (ref 13.0–17.0)
Lymphocytes Relative: 14.5 % (ref 12.0–46.0)
Lymphs Abs: 1.2 10*3/uL (ref 0.7–4.0)
MCHC: 34.2 g/dL (ref 30.0–36.0)
Neutro Abs: 5.9 10*3/uL (ref 1.4–7.7)
Platelets: 210 10*3/uL (ref 150.0–400.0)
RDW: 14.4 % (ref 11.5–14.6)

## 2013-03-17 MED ORDER — FUROSEMIDE 40 MG PO TABS: 40.0000 mg | ORAL_TABLET | Freq: Every day | ORAL | Status: DC

## 2013-03-17 NOTE — Assessment & Plan Note (Signed)
This is a difficult situation. Fortunately has seen Dr. Graciela Husbands in a little over a week. The patient has atrial flutter.  He likely has some LV dysfunction which will better assess with an echocardiogram. We attempted to start him on anticoagulation, and he immediately bled from his hemorrhoids resulting in prompt discontinuation. Since then, the patient's daughter and has a very large bruise along his left lateral chest as well as other scrapes and bruises elsewhere. He does also have some evidence of volume overload, and this is evidenced by his elevated pro BNP, his lower extremity edema, and his symptoms. We will start him on 40 mg of furosemide on a daily basis, and I will have him see our extenders staffer early next week. He will need electrolytes at that time. In addition he will see Dr. Graciela Husbands to follow that. Whether or not he is a candidate for atrial flutter ablation, is unclear. This might resolve the issue of both risk and symptoms. His last echocardiogram demonstrated preserved overall LV function, so be important to find out where we stand at this point in time.

## 2013-03-17 NOTE — Assessment & Plan Note (Signed)
Patient is continued to have some hemorrhoidal bleeding. Check CBC today.

## 2013-03-17 NOTE — Patient Instructions (Addendum)
Your physician has recommended you make the following change in your medication: STOP HCTZ, START Furosemide 40mg  take one by mouth daily in the mornig  Your physician recommends that you have lab work today: BMP and CBC  Your physician has requested that you have an echocardiogram. Echocardiography is a painless test that uses sound waves to create images of your heart. It provides your doctor with information about the size and shape of your heart and how well your heart's chambers and valves are working. This procedure takes approximately one hour. There are no restrictions for this procedure.  Your physician recommends that you schedule a follow-up appointment on Monday with Tereso Newcomer PA-C.

## 2013-03-17 NOTE — Assessment & Plan Note (Signed)
Recent episode of syncope was likely orthostatic in nature, and is occurred shortly after he stood up.

## 2013-03-17 NOTE — Progress Notes (Signed)
HPI:  Is in for followup. He was seen in emergency room. He has some fluid overload. In addition, he fell the other day after getting up. He got lightheaded went down but he never lost consciousness.  He got multiple bruises and scrapes, and large bruise along his left chest.   He also continues to bleed from his hemorrhoids.  No chest pain.  We had considered a lower dose anticoagulant for him after the last attempt failed, but this makes that issue somewhat more clear.  In the ER, ProBNP was significantly elevated.  He was supposed to be seen back earlier, but refused to see a PA in clinic here.     Current Outpatient Prescriptions  Medication Sig Dispense Refill  . acetaminophen (TYLENOL) 500 MG tablet Take 500 mg by mouth daily as needed for pain.      Marland Kitchen aspirin EC 81 MG tablet Take 81 mg by mouth daily.      Marland Kitchen b complex vitamins tablet Take 1 tablet by mouth every evening.       . beta carotene w/minerals (OCUVITE) tablet Take 1 tablet by mouth daily.      . carvedilol (COREG) 12.5 MG tablet Take 1 tablet (12.5 mg total) by mouth 2 (two) times daily with a meal.  60 tablet  6  . furosemide (LASIX) 20 MG tablet Take 1 tablet (20 mg total) by mouth 2 (two) times daily.  5 tablet  0  . hydrochlorothiazide (MICROZIDE) 12.5 MG capsule Take 1 capsule (12.5 mg total) by mouth daily.  30 capsule  6  . potassium chloride (K-DUR) 10 MEQ tablet Take 10 mEq by mouth every evening.       No current facility-administered medications for this visit.    Allergies  Allergen Reactions  . Amlodipine Other (See Comments)    Feet swelled  . Atorvastatin Other (See Comments)     leg pain  . Carvedilol Other (See Comments)    Excessive weight gain    Past Medical History  Diagnosis Date  . A-fib     paroxysmal, was on coumadin , pt desired to stop it , no longer a candidate per cards  . Tachy-brady syndrome   . CAD (coronary artery disease)     MI angioplasty 62, s/p CABG 01/2006- pacemaker   (12-2006), NSTMI  7-11  . CHF (congestive heart failure)   . Hyperlipidemia   . Hypertension   . Spinal stenosis      has seen Dr Ethelene Hal before, s/p local injection which helped x a while   . GERD (gastroesophageal reflux disease)   . RLS (restless legs syndrome)     ? of  . Vitamin B12 deficiency   . Insomnia   . History of BPH     TURP '96 at [facility] 96, urethoplast 97 ( History of bladder outlet obstruction, chronic incontinence)    Past Surgical History  Procedure Laterality Date  . Coronary artery bypass graft    . Pacemaker insertion      12-2006 PPM Medtr[employer]  . Turp vaporization      x 2  . Inguinal hernia repair      Right  . Replacement total knee  2006    Left  . Carpal tunnel release      bilateral    Family History  Problem Relation Age of Onset  . Heart disease Neg Hx   . Diabetes Neg Hx     History   Social History  .  Marital Status: Married    Spouse Name: N/A    Number of Children: N/A  . Years of Education: N/A   Occupational History  . Not on file.   Social History Main Topics  . Smoking status: Former Games developer  . Smokeless tobacco: Not on file  . Alcohol Use: No  . Drug Use:   . Sexually Active:    Other Topics Concern  . Not on file   Social History Narrative  . No narrative on file    ROS: Please see the HPI.  All other systems reviewed and negative.  PHYSICAL EXAM:  BP 146/86  Pulse 73  Ht 5\' 9"  (1.753 m)  Wt 225 lb (102.059 kg)  BMI 33.21 kg/m2  SpO2 97%  General: Well developed, well nourished, in no acute distress.  Looks more feable Head:  Normocephalic and atraumatic. Neck: mod JVD Lungs: Clear to auscultation and percussion. Heart: irregularly irregular   Chest:  Large 5 by 5 cm bruise, non tender or fluctuant, along the left chest wall.   Abdomen:  Normal bowel sounds; soft; non tender; no organomegaly Pulses: Pulses normal in all 4 extremities. Extremities: No clubbing or cyanosis. Three plus bilateral edema.     Neurologic: Alert and oriented x 3.  EKG:  Atrial flutter, RBBB.  LVH.  No acute changes.    ASSESSMENT AND PLAN:

## 2013-03-17 NOTE — Assessment & Plan Note (Signed)
We will recheck the patient's hemoglobin

## 2013-03-17 NOTE — Assessment & Plan Note (Signed)
The patient has no current chest pain at the present time

## 2013-03-23 ENCOUNTER — Encounter: Payer: Self-pay | Admitting: *Deleted

## 2013-03-23 ENCOUNTER — Encounter: Payer: Self-pay | Admitting: Physician Assistant

## 2013-03-23 ENCOUNTER — Ambulatory Visit (INDEPENDENT_AMBULATORY_CARE_PROVIDER_SITE_OTHER): Payer: Medicare Other | Admitting: Physician Assistant

## 2013-03-23 ENCOUNTER — Ambulatory Visit (HOSPITAL_BASED_OUTPATIENT_CLINIC_OR_DEPARTMENT_OTHER): Payer: Medicare Other | Admitting: Radiology

## 2013-03-23 ENCOUNTER — Inpatient Hospital Stay (HOSPITAL_COMMUNITY)
Admission: AD | Admit: 2013-03-23 | Discharge: 2013-03-27 | DRG: 292 | Disposition: A | Discharge status: Home Health | Payer: Medicare Other | Source: Ambulatory Visit | Attending: Cardiology | Admitting: Cardiology

## 2013-03-23 VITALS — BP 132/84 | HR 70 | Ht 69.0 in | Wt 223.0 lb

## 2013-03-23 DIAGNOSIS — I2510 Atherosclerotic heart disease of native coronary artery without angina pectoris: Secondary | ICD-10-CM

## 2013-03-23 DIAGNOSIS — I509 Heart failure, unspecified: Secondary | ICD-10-CM

## 2013-03-23 DIAGNOSIS — I4891 Unspecified atrial fibrillation: Secondary | ICD-10-CM | POA: Diagnosis present

## 2013-03-23 DIAGNOSIS — Z950 Presence of cardiac pacemaker: Secondary | ICD-10-CM

## 2013-03-23 DIAGNOSIS — D649 Anemia, unspecified: Secondary | ICD-10-CM | POA: Diagnosis present

## 2013-03-23 DIAGNOSIS — G2581 Restless legs syndrome: Secondary | ICD-10-CM | POA: Diagnosis present

## 2013-03-23 DIAGNOSIS — E538 Deficiency of other specified B group vitamins: Secondary | ICD-10-CM | POA: Diagnosis present

## 2013-03-23 DIAGNOSIS — K625 Hemorrhage of anus and rectum: Secondary | ICD-10-CM

## 2013-03-23 DIAGNOSIS — M4800 Spinal stenosis, site unspecified: Secondary | ICD-10-CM | POA: Diagnosis present

## 2013-03-23 DIAGNOSIS — N400 Enlarged prostate without lower urinary tract symptoms: Secondary | ICD-10-CM

## 2013-03-23 DIAGNOSIS — Z96659 Presence of unspecified artificial knee joint: Secondary | ICD-10-CM

## 2013-03-23 DIAGNOSIS — I5030 Unspecified diastolic (congestive) heart failure: Secondary | ICD-10-CM

## 2013-03-23 DIAGNOSIS — I4892 Unspecified atrial flutter: Secondary | ICD-10-CM | POA: Diagnosis present

## 2013-03-23 DIAGNOSIS — I5033 Acute on chronic diastolic (congestive) heart failure: Secondary | ICD-10-CM

## 2013-03-23 DIAGNOSIS — I495 Sick sinus syndrome: Secondary | ICD-10-CM | POA: Diagnosis present

## 2013-03-23 DIAGNOSIS — L02419 Cutaneous abscess of limb, unspecified: Secondary | ICD-10-CM | POA: Diagnosis present

## 2013-03-23 DIAGNOSIS — Z79899 Other long term (current) drug therapy: Secondary | ICD-10-CM

## 2013-03-23 DIAGNOSIS — N179 Acute kidney failure, unspecified: Secondary | ICD-10-CM | POA: Diagnosis not present

## 2013-03-23 DIAGNOSIS — I252 Old myocardial infarction: Secondary | ICD-10-CM

## 2013-03-23 DIAGNOSIS — L539 Erythematous condition, unspecified: Secondary | ICD-10-CM

## 2013-03-23 DIAGNOSIS — K648 Other hemorrhoids: Secondary | ICD-10-CM | POA: Diagnosis present

## 2013-03-23 DIAGNOSIS — Z951 Presence of aortocoronary bypass graft: Secondary | ICD-10-CM

## 2013-03-23 DIAGNOSIS — E785 Hyperlipidemia, unspecified: Secondary | ICD-10-CM | POA: Diagnosis present

## 2013-03-23 DIAGNOSIS — I10 Essential (primary) hypertension: Secondary | ICD-10-CM | POA: Diagnosis present

## 2013-03-23 DIAGNOSIS — R55 Syncope and collapse: Secondary | ICD-10-CM | POA: Diagnosis present

## 2013-03-23 DIAGNOSIS — K219 Gastro-esophageal reflux disease without esophagitis: Secondary | ICD-10-CM | POA: Diagnosis present

## 2013-03-23 HISTORY — DX: Acute myocardial infarction, unspecified: I21.9

## 2013-03-23 HISTORY — DX: Unspecified osteoarthritis, unspecified site: M19.90

## 2013-03-23 LAB — CBC WITH DIFFERENTIAL/PLATELET
Basophils Absolute: 0 10*3/uL (ref 0.0–0.1)
Eosinophils Relative: 4 % (ref 0–5)
HCT: 29.6 % — ABNORMAL LOW (ref 39.0–52.0)
Lymphocytes Relative: 15 % (ref 12–46)
Lymphs Abs: 1.3 10*3/uL (ref 0.7–4.0)
MCV: 88.9 fL (ref 78.0–100.0)
Monocytes Absolute: 0.8 10*3/uL (ref 0.1–1.0)
Neutro Abs: 6 10*3/uL (ref 1.7–7.7)
Platelets: 180 10*3/uL (ref 150–400)
RBC: 3.33 MIL/uL — ABNORMAL LOW (ref 4.22–5.81)
RDW: 14.3 % (ref 11.5–15.5)
WBC: 8.4 10*3/uL (ref 4.0–10.5)

## 2013-03-23 LAB — COMPREHENSIVE METABOLIC PANEL
ALT: 22 U/L (ref 0–53)
AST: 21 U/L (ref 0–37)
CO2: 27 mEq/L (ref 19–32)
Calcium: 8.7 mg/dL (ref 8.4–10.5)
Chloride: 103 mEq/L (ref 96–112)
GFR calc Af Amer: 57 mL/min — ABNORMAL LOW (ref >90)
GFR calc non Af Amer: 49 mL/min — ABNORMAL LOW (ref >90)
Glucose, Bld: 96 mg/dL (ref 70–99)
Sodium: 139 mEq/L (ref 135–145)
Total Bilirubin: 1.4 mg/dL — ABNORMAL HIGH (ref 0.3–1.2)

## 2013-03-23 LAB — PRO B NATRIURETIC PEPTIDE: Pro B Natriuretic peptide (BNP): 2160 pg/mL — ABNORMAL HIGH (ref 0–450)

## 2013-03-23 MED ORDER — ENOXAPARIN SODIUM 40 MG/0.4ML ~~LOC~~ SOLN
40.0000 mg | SUBCUTANEOUS | Status: DC
  Administered 2013-03-23 – 2013-03-24 (×2): 40 mg via SUBCUTANEOUS
  Filled 2013-03-23 (×4): qty 0.4

## 2013-03-23 MED ORDER — POTASSIUM CHLORIDE CRYS ER 20 MEQ PO TBCR
20.0000 meq | EXTENDED_RELEASE_TABLET | Freq: Two times a day (BID) | ORAL | Status: DC
  Administered 2013-03-23: 20 meq via ORAL
  Filled 2013-03-23 (×3): qty 1

## 2013-03-23 MED ORDER — SODIUM CHLORIDE 0.9 % IJ SOLN
3.0000 mL | Freq: Two times a day (BID) | INTRAMUSCULAR | Status: DC
  Administered 2013-03-23 – 2013-03-27 (×8): 3 mL via INTRAVENOUS

## 2013-03-23 MED ORDER — ONDANSETRON HCL 4 MG/2ML IJ SOLN: 4.0000 mg | Freq: Four times a day (QID) | INTRAMUSCULAR | Status: DC | PRN

## 2013-03-23 MED ORDER — CARVEDILOL 12.5 MG PO TABS
12.5000 mg | ORAL_TABLET | Freq: Two times a day (BID) | ORAL | Status: DC
  Administered 2013-03-24 – 2013-03-27 (×8): 12.5 mg via ORAL
  Filled 2013-03-23 (×10): qty 1

## 2013-03-23 MED ORDER — OCUVITE PO TABS
1.0000 | ORAL_TABLET | Freq: Every day | ORAL | Status: DC
  Administered 2013-03-24 – 2013-03-27 (×4): 1 via ORAL
  Filled 2013-03-23 (×6): qty 1

## 2013-03-23 MED ORDER — FUROSEMIDE 10 MG/ML IJ SOLN
60.0000 mg | Freq: Two times a day (BID) | INTRAMUSCULAR | Status: DC
  Administered 2013-03-23 – 2013-03-25 (×4): 60 mg via INTRAVENOUS
  Filled 2013-03-23 (×5): qty 6

## 2013-03-23 MED ORDER — SODIUM CHLORIDE 0.9 % IV SOLN: 250.0000 mL | INTRAVENOUS | Status: DC | PRN

## 2013-03-23 MED ORDER — B COMPLEX-C PO TABS
1.0000 | ORAL_TABLET | Freq: Every evening | ORAL | Status: DC
  Administered 2013-03-24 – 2013-03-27 (×5): 1 via ORAL
  Filled 2013-03-23 (×6): qty 1

## 2013-03-23 MED ORDER — OXYCODONE-ACETAMINOPHEN 5-325 MG PO TABS
1.0000 | ORAL_TABLET | Freq: Three times a day (TID) | ORAL | Status: DC | PRN
  Administered 2013-03-23: 1 via ORAL
  Administered 2013-03-24: 2 via ORAL
  Administered 2013-03-25: 1 via ORAL
  Administered 2013-03-25 – 2013-03-27 (×6): 2 via ORAL
  Filled 2013-03-23: qty 2
  Filled 2013-03-23: qty 1
  Filled 2013-03-23: qty 2
  Filled 2013-03-23: qty 1
  Filled 2013-03-23 (×7): qty 2

## 2013-03-23 MED ORDER — B COMPLEX PO TABS
1.0000 | ORAL_TABLET | Freq: Every evening | ORAL | Status: DC
  Filled 2013-03-23 (×2): qty 1

## 2013-03-23 MED ORDER — ACETAMINOPHEN 325 MG PO TABS
650.0000 mg | ORAL_TABLET | ORAL | Status: DC | PRN
  Administered 2013-03-23 – 2013-03-27 (×6): 650 mg via ORAL
  Filled 2013-03-23 (×7): qty 2

## 2013-03-23 MED ORDER — ASPIRIN EC 81 MG PO TBEC
81.0000 mg | DELAYED_RELEASE_TABLET | Freq: Every day | ORAL | Status: DC
  Administered 2013-03-24 – 2013-03-25 (×2): 81 mg via ORAL
  Filled 2013-03-23 (×3): qty 1

## 2013-03-23 MED ORDER — SODIUM CHLORIDE 0.9 % IJ SOLN
3.0000 mL | INTRAMUSCULAR | Status: DC | PRN
  Administered 2013-03-27: 3 mL via INTRAVENOUS

## 2013-03-23 NOTE — Progress Notes (Signed)
[address], [address] [place], [place]  [address] Phone: [phone] Fax:  [phone]  Date:  03/23/2013   ID:  [name], DOB [birth year], MRN [id number]  PCP:  Willow Ora, MD  Primary Cardiologist:  Dr.  Shawnie Pons   Primary Electrophysiologist:  Dr. [name]    History of Present Illness: [name] is a 79 y.o. male who returns for follow up on CHF.  He has a hx of CAD, s/p CABG in 2007, atrial fibrillation/flutter, tachybradycardia syndrome, s/p pacemaker, diastolic CHF, HTN, HL. Nuclear study 10/2005: EF 62%, mild apical ischemia.  Last LHC in the setting of non-STEMI 06/2010: Patent LIMA-LAD, patent SVG-DX, patent SVG-OM, patent SVG-PDA, subtotally occluded small marginal branch. Medical therapy recommended.   Echocardiogram 06/2010: Mild LVH, EF 65-70%, grade 2 diastolic dysfunction.  Recently seen by Dr. Riley Kill and noted to be back in AFlutter. He was recently placed on anticoagulation for the atrial flutter. He had bleeding from his hemorrhoids and this was discontinued. He is to see Dr. Graciela Husbands later this week in followup. Furthrer rhythm management was to be assessed by Dr. Graciela Husbands as the patient cannot tolerate anticoagulation.  He was seen in the ED recently with volume overload. Dr. Riley Kill placed him on Lasix and had hin followup today.  Patient notes no improvement in his edema since last seen.  Weights here are down only 2 lbs.  He describes NYHA class III dyspnea.  He has been sleeping in a recliner over the last month.  He has noted PND.  No chest pain.  He has had a recent hx of syncope felt to be from orthostatic hypotension.  Of note he has a long hx of bladder outlet obstruction from urethral stricture after a TURP many years ago.  He had a prolonged course at [facility] for corrective surgery that has left him with significant issues with bladder emptying.  Of note, follow up echocardiogram done today is pending.  Labs (3/14):  TSH 3.25 Labs (4/14):   K 4.2, Cr 1.2, pBNP 1704, Hgb 10.2   Wt Readings from Last 3 Encounters:  03/23/13 223 lb (101.152 kg)  03/17/13 225 lb (102.059 kg)  03/06/13 221 lb 8 oz (100.472 kg)     Past Medical History  Diagnosis Date  . A-fib     paroxysmal, was on coumadin , pt desired to stop it , no longer a candidate per cards  . Tachy-brady syndrome   . CAD (coronary artery disease)     MI angioplasty 51, s/p CABG 01/2006- pacemaker  (12-2006), NSTMI  7-11  . CHF (congestive heart failure)   . Hyperlipidemia   . Hypertension   . Spinal stenosis      has seen Dr Ethelene Hal before, s/p local injection which helped x a while   . GERD (gastroesophageal reflux disease)   . RLS (restless legs syndrome)     ? of  . Vitamin B12 deficiency   . Insomnia   . History of BPH     TURP '96 at [facility] 96, urethoplast 97 ( History of bladder outlet obstruction, chronic incontinence)    Past Surgical History  Procedure Laterality Date  . Coronary artery bypass graft    . Pacemaker insertion      12-2006 PPM Medtronic  . Turp vaporization      x 2  . Inguinal hernia repair      Right  . Replacement total knee  2006    Left  .  Carpal tunnel release      bilateral     Current Outpatient Prescriptions  Medication Sig Dispense Refill  . acetaminophen (TYLENOL) 500 MG tablet Take 500 mg by mouth daily as needed for pain.      Marland Kitchen aspirin EC 81 MG tablet Take 81 mg by mouth daily.      Marland Kitchen b complex vitamins tablet Take 1 tablet by mouth every evening.       . beta carotene w/minerals (OCUVITE) tablet Take 1 tablet by mouth daily.      . carvedilol (COREG) 12.5 MG tablet Take 1 tablet (12.5 mg total) by mouth 2 (two) times daily with a meal.  60 tablet  6  . furosemide (LASIX) 40 MG tablet Take 1 tablet (40 mg total) by mouth daily.  30 tablet  6  . potassium chloride (K-DUR) 10 MEQ tablet Take 10 mEq by mouth every evening.       No current facility-administered medications for this visit.    Allergies:    Allergies   Allergen Reactions  . Amlodipine Other (See Comments)    Feet swelled  . Atorvastatin Other (See Comments)     leg pain    Social History:  The patient  reports that he has quit smoking. He does not have any smokeless tobacco history on file. He reports that he does not drink alcohol.   Family History: The patient's family history is negative for Heart disease and Diabetes.   ROS:  Please see the history of present illness.    All other systems reviewed and negative.   PHYSICAL EXAM: VS:  BP 132/84  Pulse 70  Ht 5\' 9"  (1.753 m)  Wt 223 lb (101.152 kg)  BMI 32.92 kg/m2 Well nourished, well developed, in no acute distress HEENT: normal Neck: + JVD at 90 Cardiac:  normal S1, S2; irregularly irregular rhythm; no murmur Lungs:  Decreased breath sounds bilaterally, no wheezing, rhonchi or rales Abd: distended Ext: tight 3+ bilateral LE edema Skin: warm and dry Neuro:  CNs 2-12 intact, no focal abnormalities noted  EKG:  Atrial flutter, HR 70     ASSESSMENT AND PLAN:  1. A/C Diastolic CHF:  Weights are up 10 lbs since 12/2012 which is the last time he was noted to be in NSR.  He has had no improvement with Lasix 40 mg QD.  He is massively volume overloaded.  He has trouble with bladder outlet obstruction due to prior TURP.  He would benefit from admission to the hospital for IV diuresis and Foley cath placement.  Discussed with Dr. Zackery Barefoot who also saw the patient.  Admit to [facility].  Start Lasix 60 mg IV bid.  Add K+ 20 mEq bid.  Follow renal fxn closely.  Echo from today pending. 2. Atrial Flutter:  He was to see Dr. [name] this week.  Will have Dr. Graciela Husbands see him in the hospital to help with management of his rhythm.  Question if AFlutter is in part contributing to volume overload.  He is not on anticoagulation due to significant hemorrhoidal bleeding.  Will cover with DVT dose Lovenox for now. 3. CAD:  No angina.  Continue ASA. 4. Hypertension:  Controlled.  Continue  current therapy.  5. BPH:  Will place foley cath.  If problematic, may need urology to see him. 6. Anemia:  Recent Hgb stable. 7. Syncope:  Recent hx of orthostatic intolerance.  8. S/p Pacemaker:  Will have Dr. Graciela Husbands see in  the hospital. 9. Disposition:  Admit to [facility] today.    Luna Glasgow, PA-C  3:33 PM 03/23/2013

## 2013-03-23 NOTE — Progress Notes (Signed)
Pt c/o 10/10 pain in hip area.  Only pain medicine order is tylenol which the pt says does not work.  Notified Tereso Newcomer PA, order given for 1-2 tab percocet 5-325 q8 hours PRN.  Will continue to monitor pt.

## 2013-03-23 NOTE — Patient Instructions (Addendum)
YOU HAVE BEEN ADMITTED TO [facility] [facility] TODAY

## 2013-03-23 NOTE — Progress Notes (Signed)
Echocardiogram performed.  

## 2013-03-23 NOTE — H&P (Signed)
History and Physical  Date:  03/23/2013   ID:  [name], DOB [birth year], MRN [id number]  PCP:  Willow Ora, MD  Primary Cardiologist:  Dr.  Shawnie Pons   Primary Electrophysiologist:  Dr. [name]    History of Present Illness: [name] is a 79 y.o. male who returns for follow up on CHF.  He has a hx of CAD, s/p CABG in 2007, atrial fibrillation/flutter, tachybradycardia syndrome, s/p pacemaker, diastolic CHF, HTN, HL. Nuclear study 10/2005: EF 62%, mild apical ischemia.  Last LHC in the setting of non-STEMI 06/2010: Patent LIMA-LAD, patent SVG-DX, patent SVG-OM, patent SVG-PDA, subtotally occluded small marginal branch. Medical therapy recommended.   Echocardiogram 06/2010: Mild LVH, EF 65-70%, grade 2 diastolic dysfunction.  Recently seen by Dr. Riley Kill and noted to be back in AFlutter. He was recently placed on anticoagulation for the atrial flutter. He had bleeding from his hemorrhoids and this was discontinued. He is to see Dr. Graciela Husbands later this week in followup. Furthrer rhythm management was to be assessed by Dr. Graciela Husbands as the patient cannot tolerate anticoagulation.  He was seen in the ED recently with volume overload. Dr. Riley Kill placed him on Lasix and had hin followup today.  Patient notes no improvement in his edema since last seen.  Weights here are down only 2 lbs.  He describes NYHA class III dyspnea.  He has been sleeping in a recliner over the last month.  He has noted PND.  No chest pain.  He has had a recent hx of syncope felt to be from orthostatic hypotension.  Of note he has a long hx of bladder outlet obstruction from urethral stricture after a TURP many years ago.  He had a prolonged course at [facility] for corrective surgery that has left him with significant issues with bladder emptying.  Of note, follow up echocardiogram done today is pending.  Labs (3/14):  TSH 3.25 Labs (4/14):  K 4.2, Cr 1.2, pBNP 1704, Hgb 10.2   Wt Readings from Last 3 Encounters:  03/23/13  223 lb (101.152 kg)  03/17/13 225 lb (102.059 kg)  03/06/13 221 lb 8 oz (100.472 kg)     Past Medical History  Diagnosis Date  . A-fib     paroxysmal, was on coumadin , pt desired to stop it , no longer a candidate per cards  . Tachy-brady syndrome   . CAD (coronary artery disease)     MI angioplasty 54, s/p CABG 01/2006- pacemaker  (12-2006), NSTMI  7-11  . CHF (congestive heart failure)   . Hyperlipidemia   . Hypertension   . Spinal stenosis      has seen Dr Ethelene Hal before, s/p local injection which helped x a while   . GERD (gastroesophageal reflux disease)   . RLS (restless legs syndrome)     ? of  . Vitamin B12 deficiency   . Insomnia   . History of BPH     TURP '96 at [facility] 96, urethoplast 97 ( History of bladder outlet obstruction, chronic incontinence)    Past Surgical History  Procedure Laterality Date  . Coronary artery bypass graft    . Pacemaker insertion      12-2006 PPM Medtronic  . Turp vaporization      x 2  . Inguinal hernia repair      Right  . Replacement total knee  2006    Left  . Carpal tunnel release      bilateral  Current Outpatient Prescriptions  Medication Sig Dispense Refill  . acetaminophen (TYLENOL) 500 MG tablet Take 500 mg by mouth daily as needed for pain.      Marland Kitchen aspirin EC 81 MG tablet Take 81 mg by mouth daily.      Marland Kitchen b complex vitamins tablet Take 1 tablet by mouth every evening.       . beta carotene w/minerals (OCUVITE) tablet Take 1 tablet by mouth daily.      . carvedilol (COREG) 12.5 MG tablet Take 1 tablet (12.5 mg total) by mouth 2 (two) times daily with a meal.  60 tablet  6  . furosemide (LASIX) 40 MG tablet Take 1 tablet (40 mg total) by mouth daily.  30 tablet  6  . potassium chloride (K-DUR) 10 MEQ tablet Take 10 mEq by mouth every evening.       No current facility-administered medications for this visit.    Allergies:    Allergies  Allergen Reactions  . Amlodipine Other (See Comments)    Feet swelled  .  Atorvastatin Other (See Comments)     leg pain    Social History:  The patient  reports that he has quit smoking. He does not have any smokeless tobacco history on file. He reports that he does not drink alcohol.   Family History: The patient's family history is negative for Heart disease and Diabetes.   ROS:  Please see the history of present illness.    All other systems reviewed and negative.   PHYSICAL EXAM: VS:  BP 132/84  Pulse 70  Ht 5\' 9"  (1.753 m)  Wt 223 lb (101.152 kg)  BMI 32.92 kg/m2 Well nourished, well developed, in no acute distress HEENT: normal Neck: + JVD at 90 Cardiac:  normal S1, S2; irregularly irregular rhythm; no murmur Lungs:  Decreased breath sounds bilaterally, no wheezing, rhonchi or rales Abd: distended Ext: tight 3+ bilateral LE edema Skin: warm and dry Neuro:  CNs 2-12 intact, no focal abnormalities noted  EKG:  Atrial flutter, HR 70     ASSESSMENT AND PLAN:  1. A/C Diastolic CHF:  Weights are up 10 lbs since 12/2012 which is the last time he was noted to be in NSR.  He has had no improvement with Lasix 40 mg QD.  He is massively volume overloaded.  He has trouble with bladder outlet obstruction due to prior TURP.  He would benefit from admission to the hospital for IV diuresis and Foley cath placement.  Discussed with Dr. Zackery Barefoot who also saw the patient.  Admit to [facility].  Start Lasix 60 mg IV bid.  Add K+ 20 mEq bid.  Follow renal fxn closely.  Echo from today pending. 2. Atrial Flutter:  He was to see Dr. [name] this week.  Will have Dr. Graciela Husbands see him in the hospital to help with management of his rhythm.  Question if AFlutter is in part contributing to volume overload.  He is not on anticoagulation due to significant hemorrhoidal bleeding.  Will cover with DVT dose Lovenox for now. 3. CAD:  No angina.  Continue ASA. 4. Hypertension:  Controlled.  Continue current therapy.  5. BPH:  Will place foley cath.  If problematic, may need  urology to see him. 6. Anemia:  Recent Hgb stable. 7. Syncope:  Recent hx of orthostatic intolerance.  8. S/p Pacemaker:  Will have Dr. Graciela Husbands see in the hospital. 9. Disposition:  Admit to [facility] today.  Signed, Tereso Newcomer, PA-C  3:33 PM 03/23/2013      Patient seen and examined. I agree with the assessment and plan as detailed above. See also my additional thoughts below.   I reviewed all the information with [name]. I saw the patient and examined him and I spoke with him and his family. He needs to be admitted for IV diuresis. We also need to see something definitive can be done about his atrial arrhythmia.  Willa Rough, MD, [facility] 03/23/2013 5:59 PM

## 2013-03-24 ENCOUNTER — Inpatient Hospital Stay (HOSPITAL_COMMUNITY): Payer: Medicare Other

## 2013-03-24 ENCOUNTER — Encounter (HOSPITAL_COMMUNITY): Payer: Self-pay | Admitting: General Practice

## 2013-03-24 ENCOUNTER — Other Ambulatory Visit: Payer: Self-pay

## 2013-03-24 DIAGNOSIS — I5033 Acute on chronic diastolic (congestive) heart failure: Principal | ICD-10-CM

## 2013-03-24 LAB — GLUCOSE, CAPILLARY
Glucose-Capillary: 108 mg/dL — ABNORMAL HIGH (ref 70–99)
Glucose-Capillary: 105 mg/dL — ABNORMAL HIGH (ref 70–99)

## 2013-03-24 LAB — BASIC METABOLIC PANEL
CO2: 27 mEq/L (ref 19–32)
Chloride: 100 mEq/L (ref 96–112)
GFR calc Af Amer: 54 mL/min — ABNORMAL LOW (ref >90)
Sodium: 139 mEq/L (ref 135–145)

## 2013-03-24 NOTE — Progress Notes (Signed)
Utilization Review Completed Laurelyn Terrero J. Tanish Sinkler, RN, BSN, NCM [phone]  

## 2013-03-24 NOTE — Progress Notes (Signed)
Patient had stated he had a fall a week ago in which he stated he did not go to the doctor to be evaluated when it occurred. Patient has a large bruise noted on the left flank side of trunk area 3scratches with scabs and bruise noted on left arm/elbow area. Patient complained of hip pain at times but continues to state that pain is from chronic arthritis. Pt not able to get comfortable in the bed. On one occasion patient did call out for wife and forgot that he was in the hospital. Had had several times of urgency of voiding noted as well during the night. (see previous progress note.) Tylenol was given during the night and two percocet given early this morning. Patient states the percocet seemed to really work a lot better. Patient got up to the chair with one assist then back to bed this morning for a chest xray at change of shift. Currently patient in bed and back from radiology and oncoming nurse given report Nurse signing off at this time.

## 2013-03-24 NOTE — Care Management Note (Signed)
    Page 1 of 1   03/24/2013     10:21:51 AM   CARE MANAGEMENT NOTE 03/24/2013  Patient:  [name], [name]   Account Number:  [id number]  Date Initiated:  03/24/2013  Documentation initiated by:  [facility]  Subjective/Objective Assessment:   79 yo male admitted with diastolic heart failure     Action/Plan:   home with wife/ home with home health   Anticipated DC Date:  03/27/2013   Anticipated DC Plan:  HOME W HOME HEALTH SERVICES      DC Planning Services  CM consult      [facility] Choice  HOME HEALTH   Choice offered to / List presented to:  C-1 Patient        HH arranged  HH-1 RN  HH-10 DISEASE MANAGEMENT      HH agency  [facility]   Status of service:  Completed, signed off Medicare Important Message given?   (If response is "NO", the following Medicare IM given date fields will be blank) Date Medicare IM given:   Date Additional Medicare IM given:    Discharge Disposition:    Per UR Regulation:  Reviewed for med. necessity/level of care/duration of stay  If discussed at Long Length of Stay Meetings, dates discussed:    Comments:  03/24/13 @1000 .Marland KitchenMarland KitchenOletta Cohn, RN, BSN, [employer] [phone] Spoke with pt and wife regarding discharge planning. Offered pt [place] [employer] [facility] (HH) Agencies. Pt chose [facility]) to render services.  Darlin Drop of [facility] notified.  No DME needs identified at this time.

## 2013-03-24 NOTE — Progress Notes (Signed)
Patient ID: [name], male   DOB: [birth year], 79 y.o.   MRN: [id number] Subjective:  Dyspnea and peripheral edema improved but not yet back to baseline  Objective:  Vital Signs in the last 24 hours: Temp:  [97.6 F (36.4 C)-97.9 F (36.6 C)] 97.6 F (36.4 C) (04/22 1308) Pulse Rate:  [64-96] 96 (04/22 1308) Resp:  [18-21] 20 (04/22 1308) BP: (106-154)/(58-84) 118/59 mmHg (04/22 1308) SpO2:  [96 %-99 %] 99 % (04/22 1308) Weight:  [214 lb 3.2 oz (97.16 kg)-223 lb (101.152 kg)] 214 lb 3.2 oz (97.16 kg) (04/22 0557)  Intake/Output from previous day: 04/21 0701 - 04/22 0700 In: -  Out: 550 [Urine:550] Intake/Output from this shift: Total I/O In: 560 [P.O.:560] Out: 725 [Urine:725]  Physical Exam: elderly appearing NAD HEENT: Unremarkable Neck:  8 cm JVD, no thyromegally Back:  No CVA tenderness Lungs:  Clear except for rales in the bases bilaterally HEART:  Regular rate rhythm, no murmurs, no rubs, no clicks Abd:  soft, positive bowel sounds, no organomegally, no rebound, no guarding Ext:  2 plus pulses, 3+ edema bilaterally, no cyanosis, no clubbing Skin:  No rashes no nodules Neuro:  CN II through XII intact, motor grossly intact  Lab Results:  Recent Labs  03/23/13 1721  WBC 8.4  HGB 9.8*  PLT 180    Recent Labs  03/23/13 1721 03/24/13 0620  NA 139 139  K 4.4 5.7*  CL 103 100  CO2 27 27  GLUCOSE 96 104*  BUN 27* 28*  CREATININE 1.26 1.30   No results found for this basename: TROPONINI, CK, MB,  in the last 72 hours Hepatic Function Panel  Recent Labs  03/23/13 1721  PROT 7.1  ALBUMIN 3.5  AST 21  ALT 22  ALKPHOS 63  BILITOT 1.4*   No results found for this basename: CHOL,  in the last 72 hours No results found for this basename: PROTIME,  in the last 72 hours  Imaging: Dg Chest 2 View  03/24/2013  *RADIOLOGY REPORT*  Clinical Data: Congestive heart failure  CHEST - 2 VIEW  Comparison: 03/06/2013  Findings: Left-sided pacemaker overlies  stable enlarged cardiac silhouette.  No effusion, infiltrate, pneumothorax.  Lungs are hyperinflated. Degenerative osteophytosis of the thoracic spine.  IMPRESSION: No acute cardiopulmonary process.   Original Report Authenticated By: Genevive Bi, M.D.     Cardiac Studies: Tele - atrial flutter with a controlled VR Assessment/Plan:  1. Acute on chronic diastolic CHF 2. Atrial fib/flutter with a controlled VR 3. HTN Rec: ContinueIV diuretics, daily weights and watch renal function. I will defer decision about rate vs rhythm control to Dr. Crissie Sickles but it sounds like he is not a good anti-coagulation candidate. Tonjua Rossetti,M.D.  LOS: 1 day    Lewayne Bunting 03/24/2013, 2:56 PM

## 2013-03-24 NOTE — Progress Notes (Signed)
Patient has been restless tonight and explains due to his fall at home about a week ago has caused him not to get sound rest and patient also states from being in the hospital is also contributing to him not getting sound sleep. Patient states,"I just want to get some rest until seven in the mornng." Patient has been up to the recliner then back to bed on 2-3 occassions for he states he can not get comfortable. Called on-call doctor, Dr. Mayford Knife of [facility] and states due to patient's age and the reverse effects of a sleep aid, and will not give order for patient's request for a sleep-aid and would like to for the doctors to address this issue in the morning when making rounds. Will explain to patient and continue to monitor patient as needed to end of shift.

## 2013-03-24 NOTE — Progress Notes (Signed)
Patient did not rest peacefully during the night. The patient has been very worried and concerned about not having a foley catheter in. Patient explained to nurse that he had a complicated surgeries to fix bladder obstruction but was to no success. Explained to patient that we would use a condom cath to measure output. Patient initially did not agree but later agreed to wear one. Nurse applied initial condom cath. Between the nurse and tech, condom cath kept coming off between 6-8 times. The patient requested it to make sure to have one on and therefore we tried to keep it on. After reading Dr. Myrtis Ser notes, it was indicated to insert foley for urinary restricture/bladder obstruction. There is an order to insert foley. Bladder scanned patient.Nurse attempted to insert foley but without success. Retention was met. Notified oncoming nurse of this to make attending doctor aware of this when making rounds. Patient is able and has been voiding but does have some incontinency at times if condom cath not on. Will continue to monitor to end of shift.

## 2013-03-24 NOTE — Progress Notes (Signed)
Patient evaluated for community based chronic disease management services with [facility] Care Management Program as a benefit of patient's [facility] Medicare Insurance.  Spoke with patient's spouse at bedside to explain [facility] Care Management services.  Spouse indicated that they could manage his care currently, but they have focused there attention on relocating to a independent senior living community.  Left contact information and [facility] literature at bedside. Made inpatient Case Manager aware that [facility] Care Management following. Of note, [facility] Care Management services does not replace or interfere with any services that are arranged by inpatient case management or social work.  For additional questions or referrals please contact Anibal Henderson BSN RN [facility] Liaison at [phone].

## 2013-03-24 NOTE — Progress Notes (Signed)
Pt has been missing the urinal and going on floor I&0 not accurate. Please note urine occurences

## 2013-03-25 ENCOUNTER — Encounter (HOSPITAL_COMMUNITY): Payer: Self-pay | Admitting: Physician Assistant

## 2013-03-25 DIAGNOSIS — I4891 Unspecified atrial fibrillation: Secondary | ICD-10-CM

## 2013-03-25 DIAGNOSIS — I10 Essential (primary) hypertension: Secondary | ICD-10-CM

## 2013-03-25 DIAGNOSIS — K625 Hemorrhage of anus and rectum: Secondary | ICD-10-CM

## 2013-03-25 LAB — BASIC METABOLIC PANEL
BUN: 34 mg/dL — ABNORMAL HIGH (ref 6–23)
CO2: 29 mEq/L (ref 19–32)
Chloride: 100 mEq/L (ref 96–112)
Chloride: 99 mEq/L (ref 96–112)
Creatinine, Ser: 1.58 mg/dL — ABNORMAL HIGH (ref 0.50–1.35)
GFR calc Af Amer: 43 mL/min — ABNORMAL LOW (ref >90)
GFR calc Af Amer: 45 mL/min — ABNORMAL LOW (ref >90)
GFR calc non Af Amer: 39 mL/min — ABNORMAL LOW (ref >90)
Potassium: 3.6 mEq/L (ref 3.5–5.1)
Potassium: 3.9 mEq/L (ref 3.5–5.1)
Sodium: 138 mEq/L (ref 135–145)
Sodium: 141 mEq/L (ref 135–145)

## 2013-03-25 MED ORDER — FUROSEMIDE 10 MG/ML IJ SOLN
80.0000 mg | Freq: Two times a day (BID) | INTRAMUSCULAR | Status: DC
  Administered 2013-03-25: 80 mg via INTRAVENOUS
  Filled 2013-03-25 (×3): qty 8

## 2013-03-25 MED ORDER — HYDROCORTISONE ACETATE 25 MG RE SUPP
25.0000 mg | Freq: Two times a day (BID) | RECTAL | Status: DC
  Administered 2013-03-25 – 2013-03-26 (×3): 25 mg via RECTAL
  Filled 2013-03-25 (×5): qty 1

## 2013-03-25 MED ORDER — DEXTROSE 5 % IV SOLN
2.0000 g | Freq: Three times a day (TID) | INTRAVENOUS | Status: DC
  Administered 2013-03-25 – 2013-03-27 (×6): 2 g via INTRAVENOUS
  Filled 2013-03-25 (×9): qty 20

## 2013-03-25 MED ORDER — RIVAROXABAN 15 MG PO TABS
15.0000 mg | ORAL_TABLET | Freq: Every day | ORAL | Status: DC
  Administered 2013-03-25 – 2013-03-27 (×3): 15 mg via ORAL
  Filled 2013-03-25 (×3): qty 1

## 2013-03-25 NOTE — Consult Note (Signed)
ANTIBIOTIC CONSULT NOTE - INITIAL  Pharmacy Consult for Cefazolin Indication: Lower extremity cellulitis  Hospital Problems: Principal Problem:   Acute on chronic diastolic heart failure Active Problems:   ANEMIA, MILD   HYPERTENSION   SYNCOPE   Coronary artery disease   Atrial flutter   BPH (benign prostatic hyperplasia)  Allergies: Allergies  Allergen Reactions  . Amlodipine Other (See Comments)    Feet swelled  . Atorvastatin Other (See Comments)     leg pain   Patient Measurements: Height: 5\' 9"  (175.3 cm) Weight: 210 lb 1.6 oz (95.3 kg) IBW/kg (Calculated) : 70.7  Vital Signs: BP 129/94  Pulse 81  Temp(Src) 97.5 F (36.4 C) (Oral)  Resp 20  Ht 5\' 9"  (1.753 m)  Wt 210 lb 1.6 oz (95.3 kg)  BMI 31.01 kg/m2  SpO2 98%  Labs:  Recent Labs  03/23/13 1721 03/24/13 0620 03/25/13 0001 03/25/13 0645  WBC 8.4  --   --   --   HGB 9.8*  --   --   --   PLT 180  --   --   --   CREATININE 1.26 1.30 1.58* 1.51*   Estimated Creatinine Clearance: 37.8 ml/min (by C-G formula based on Cr of 1.51).   Microbiology: No results found for this or any previous visit (from the past 720 hour(s)).  Medical/Surgical History: Past Medical History  Diagnosis Date  . A-fib     paroxysmal, was on coumadin , pt desired to stop it , no longer a candidate per cards  . Tachy-brady syndrome   . CAD (coronary artery disease)     MI angioplasty 3, s/p CABG 01/2006- pacemaker  (12-2006), NSTMI  7-11  . CHF (congestive heart failure)   . Hyperlipidemia   . Hypertension   . Spinal stenosis      has seen Dr Ethelene Hal before, s/p local injection which helped x a while   . GERD (gastroesophageal reflux disease)   . RLS (restless legs syndrome)     ? of  . Vitamin B12 deficiency   . Insomnia   . History of BPH     TURP '96 at [facility] 96, urethoplast 97 ( History of bladder outlet obstruction, chronic incontinence)  . Myocardial infarction   . Pacemaker   . Shortness of breath   .  Arthritis     back   Past Surgical History  Procedure Laterality Date  . Coronary artery bypass graft    . Pacemaker insertion      12-2006 PPM Medtronic  . Turp vaporization      x 2  . Inguinal hernia repair      Right  . Replacement total knee  2006    Left  . Carpal tunnel release      bilateral  . Insert / replace / remove pacemaker     Medications:  Prescriptions prior to admission  Medication Sig Dispense Refill  . acetaminophen (TYLENOL) 500 MG tablet Take 500 mg by mouth every 8 (eight) hours as needed for pain.      Marland Kitchen aspirin EC 81 MG tablet Take 81 mg by mouth daily.      Marland Kitchen b complex vitamins tablet Take 1 tablet by mouth daily.      . beta carotene w/minerals (OCUVITE) tablet Take 1 tablet by mouth daily.      . carvedilol (COREG) 12.5 MG tablet Take 1 tablet (12.5 mg total) by mouth 2 (two) times daily with a meal.  60 tablet  6  . furosemide (LASIX) 40 MG tablet Take 1 tablet (40 mg total) by mouth daily.  30 tablet  6  . potassium chloride (K-DUR) 10 MEQ tablet Take 10 mEq by mouth every evening.       Scheduled:  . B-complex with vitamin C  1 tablet Oral QPM  . beta carotene w/minerals  1 tablet Oral Daily  . carvedilol  12.5 mg Oral BID WC  . enoxaparin (LOVENOX) injection  40 mg Subcutaneous Q24H  . furosemide  80 mg Intravenous BID  . sodium chloride  3 mL Intravenous Q12H  . [DISCONTINUED] aspirin EC  81 mg Oral Daily  . [DISCONTINUED] furosemide  60 mg Intravenous BID  . [DISCONTINUED] potassium chloride  20 mEq Oral BID   Assessment:  79 y/o male admitted with acute on chronic diastolic CHF who is to be placed on Cefazolin for lower extremity cellulitis.  CrCl 38, Scr 1.5.  WBC 8.4    Goal of Therapy:  Antibiotics selected for infection/cultures and adjusted for renal or hepatic function.    Plan:   Begin Cefazolin 2 gm IV q 8 hours.  Follow up SCr, UOP, cultures, clinical course and adjust as clinically indicated.   Genieve Ramaswamy, Elisha Headland,   Pharm.D.,    4/23/201411:29 AM

## 2013-03-25 NOTE — Progress Notes (Signed)
Patient: [name] Date of Encounter: 03/25/2013, 8:03 AM Admit date: 03/23/2013     Subjective  Mr. Birchall has no new complaints this AM. He denies CP, SOB or palpitations. He asks that I send him home.   Objective  Physical Exam: Vitals: BP 129/94  Pulse 81  Temp(Src) 97.5 F (36.4 C) (Oral)  Resp 20  Ht 5\' 9"  (1.753 m)  Wt 210 lb 1.6 oz (95.3 kg)  BMI 31.01 kg/m2  SpO2 98% General: Well developed, well appearing, elderly 79 year old male in no acute distress. Neck: Supple. +JVD. Lungs: Clear bilaterally to auscultation without wheezes, rales, or rhonchi. Breathing is unlabored. Heart: Regular S1 S2 without murmur, rub or gallop.  Abdomen: Soft, non-distended. Moderate sized left flank hematoma. Extremities: No clubbing or cyanosis. 1+ LE edema to knees bilaterally.   Neuro: Alert and oriented X 3. Moves all extremities spontaneously. No focal deficits. Skin: Left upper chest / PPM implant site well healed.   Intake/Output:  Intake/Output Summary (Last 24 hours) at 03/25/13 0803 Last data filed at 03/24/13 2045  Gross per 24 hour  Intake    783 ml  Output   1025 ml  Net   -242 ml    Inpatient Medications:  . aspirin EC  81 mg Oral Daily  . B-complex with vitamin C  1 tablet Oral QPM  . beta carotene w/minerals  1 tablet Oral Daily  . carvedilol  12.5 mg Oral BID WC  . enoxaparin (LOVENOX) injection  40 mg Subcutaneous Q24H  . furosemide  60 mg Intravenous BID  . sodium chloride  3 mL Intravenous Q12H    Labs:  Recent Labs  03/25/13 0001 03/25/13 0645  NA 138 141  K 3.9 3.6  CL 99 100  CO2 29 31  GLUCOSE 114* 107*  BUN 35* 34*  CREATININE 1.58* 1.51*  CALCIUM 8.8 8.9    Recent Labs  03/23/13 1721  AST 21  ALT 22  ALKPHOS 63  BILITOT 1.4*  PROT 7.1  ALBUMIN 3.5    Recent Labs  03/23/13 1721  WBC 8.4  NEUTROABS 6.0  HGB 9.8*  HCT 29.6*  MCV 88.9  PLT 180    Recent Labs  03/23/13 1721  INR 1.17     Radiology/Studies: Dg Chest 2 View  03/24/2013  *RADIOLOGY REPORT*  Clinical Data: Congestive heart failure  CHEST - 2 VIEW  Comparison: 03/06/2013  Findings: Left-sided pacemaker overlies stable enlarged cardiac silhouette.  No effusion, infiltrate, pneumothorax.  Lungs are hyperinflated. Degenerative osteophytosis of the thoracic spine.  IMPRESSION: No acute cardiopulmonary process.   Original Report Authenticated By: Genevive Bi, M.D.     Echocardiogram: Study Conclusions - Left ventricle: The cavity size was normal. Wall thickness was increased in a pattern of moderate LVH. Systolic function was normal. The estimated ejection fraction was in the range of 50% to 55%. Wall motion was normal; there were no regional wall motion abnormalities. Left ventricular diastolic function parameters were normal. - Mitral valve: Mild regurgitation. - Left atrium: The atrium was moderately dilated. - Right ventricle: The cavity size was mildly dilated. - Right atrium: The atrium was moderately dilated. - Tricuspid valve: Moderate regurgitation.  Telemetry: rate controlled AF    Assessment and Plan  1. Acute on chronic diastolic HF - still on IV diuresis with improved LE edema 2. Atrial fib/flutter - currently rate controlled 3. Tachy-brady syndrome s/p PPM implant - normal PPM function by interrogation today 4. CAD  s/p CABG - stable without anginal symptoms 5. Left flank hematoma s/p fall  Dr. Graciela Husbands to see Signed, EDMISTEN, BROOKE PA-C  Will continue diuresis and would like to think about short term anticoagulation byt has had hemorrhoidal bleeding  Will ask GI for thoughts for 4 week course Begin ABX for lower extrer celluliitis

## 2013-03-25 NOTE — Consult Note (Signed)
[facility] Consult: 12:52 PM 03/25/2013   Referring Provider: Dr Graciela Husbands  Primary Care Physician:  Willow Ora, MD Primary Gastroenterologist:  Dr. Dorinda Hill, remotely  Reason for Consultation:  Bleeding hemorrhoids.    HPI: [name] is a 79 y.o. male.  Has diastolic CHF (EF by echo 4/21: 50% and mild/moderate tricuspid/mitral regurge), a fib/flutter Coumadin stopped due to rectal bleeding attributed to hemorrhoids, CABG 2007, non STEMI 2011, s/p pacemaker.  Bladder outlet obstruction.  Admitted 4/21 with acute on chronic diastolic CHF refractory to outpt medical mgt.  One month of LE edema, DOE, weakness.   Dr Graciela Husbands would like to be able to use 4 weeks course of anti coagulation but pt has multiple years hx of rectal bleeding that seems to have flared when placed on Xarelto last month.     On 3/4 Dr Riley Kill evaluated recurrent flutter and planned to start Xarelto 3/6 after stopping Plavix. pt had recurrence of his episodic rectal bleeding after one dose of Xarelto so it was discontinued.  However pt does not endorse that the bleeding was any worse than previous pattern.  He endorses rectal bleeding lasting about 3 days, sometimes volume is enough to cause commode water to turn medium red.  It stops on its own and may not recur for 2 to 3 weeks,  It is not associated with rectal pain.  He denies requiring transfusions or been prescribed po Iron in recent years.  Last bleeding episode was about 10 days ago.   Dr Drue Novel had arranged referral (to GI ?) for hemorrhoidal bleeding  in 2011 but pt missed appt due to hospitalization.  Had colonoscopies in 1999 and 2003;   at least once polyps of undetermined type were found according to Dr Leta Jungling notes, unable to locate the reports.   No anorexia, no rectal or abdominal pain.  No NSAIDs except 81 mg ASA. Weight overall is up, a lot of it due to edema of LE and expanding abdominal girth. Daily BMs,  does not require laxatives/stool softeners.   Labs remarkable for Hgb of  9.8 today, compared to 10.8 on 4/4.  MCV is WNL.  LFTs, Platelets and PT/INR normal.     Past Medical History  Diagnosis Date  . A-fib     paroxysmal, was on coumadin , pt desired to stop it , no longer a candidate per cards  . Tachy-brady syndrome   . CAD (coronary artery disease)     MI angioplasty 38, s/p CABG 01/2006- pacemaker  (12-2006), NSTMI  7-11  . CHF (congestive heart failure)   . Hyperlipidemia   . Hypertension   . Spinal stenosis      has seen Dr Ethelene Hal before, s/p local injection which helped x a while   . GERD (gastroesophageal reflux disease)   . RLS (restless legs syndrome)     ? of  . Vitamin B12 deficiency   . Insomnia   . History of BPH     TURP '96 at [facility] 96, urethoplast 97 ( History of bladder outlet obstruction, chronic incontinence)  . Myocardial infarction   . Pacemaker   . Shortness of breath   . Arthritis     back    Past Surgical History  Procedure Laterality Date  . Coronary artery bypass graft    . Pacemaker insertion      12-2006 PPM Medtronic  . Turp vaporization      x 2  . Inguinal hernia repair  Right  . Replacement total knee  2005, 2006    right 2005, Left 2006.  Dr  Despina Hick  . Carpal tunnel release  03/2004    bilateral.  Dr Teressa Senter  . Insert / replace / remove pacemaker      Prior to Admission medications   Medication Sig Start Date End Date Taking? Authorizing Provider  acetaminophen (TYLENOL) 500 MG tablet Take 500 mg by mouth every 8 (eight) hours as needed for pain.   Yes Historical Provider, MD  aspirin EC 81 MG tablet Take 81 mg by mouth daily.   Yes Historical Provider, MD  b complex vitamins tablet Take 1 tablet by mouth daily.   Yes Historical Provider, MD  beta carotene w/minerals (OCUVITE) tablet Take 1 tablet by mouth daily.   Yes Historical Provider, MD  carvedilol (COREG) 12.5 MG tablet Take 1 tablet (12.5 mg total) by mouth 2 (two) times  daily with a meal. 03/10/13  Yes Herby Abraham, MD  furosemide (LASIX) 40 MG tablet Take 1 tablet (40 mg total) by mouth daily. 03/17/13  Yes Herby Abraham, MD  potassium chloride (K-DUR) 10 MEQ tablet Take 10 mEq by mouth every evening.   Yes Historical Provider, MD    Scheduled Meds: . B-complex with vitamin C  1 tablet Oral QPM  . beta carotene w/minerals  1 tablet Oral Daily  . carvedilol  12.5 mg Oral BID WC  . ceFAZolin (ANCEF) 2 g IVPB  2 g Intravenous Q8H  . enoxaparin (LOVENOX) injection  40 mg Subcutaneous Q24H  . furosemide  80 mg Intravenous BID  . sodium chloride  3 mL Intravenous Q12H   Infusions:   PRN Meds: sodium chloride, acetaminophen, ondansetron (ZOFRAN) IV, oxyCODONE-acetaminophen, sodium chloride   Allergies as of 03/23/2013 - Review Complete 03/23/2013  Allergen Reaction Noted  . Amlodipine Other (See Comments) 03/06/2013  . Atorvastatin Other (See Comments) 12/23/2008    Family History  Problem Relation Age of Onset  . Heart disease Neg Hx   . Diabetes Neg Hx     History   Social History  . Marital Status: Married    Spouse Name: N/A    Number of Children: N/A  . Years of Education: N/A   Occupational History  . Not on file.   Social History Main Topics  . Smoking status: Former Games developer  . Smokeless tobacco: Never Used  . Alcohol Use: No  . Drug Use: No  . Sexually Active: Not on file   Social History Narrative  Lives at home with his wife   REVIEW OF SYSTEMS: Hx of syncope and falls in past, latest was on 4/13 and he sustained large bruise to his left thorax/back.  Did not seek medical attention. No blurry vision.   No dysphagia.  No hx liver disease Use walker, poor balance/unsteady gait.  No rash,itching or sores. Flu shot up to date. +PND, sleeps in recliner. No chest pain. No nose bleeds.  Urinary incontinence    PHYSICAL EXAM: Vital signs in last 24 hours: Temp:  [97.5 F (36.4 C)-97.8 F (36.6 C)] 97.5 F (36.4  C) (04/23 0541) Pulse Rate:  [72-96] 81 (04/23 0541) Resp:  [20] 20 (04/23 0541) BP: (100-129)/(59-94) 129/94 mmHg (04/23 0541) SpO2:  [96 %-99 %] 98 % (04/23 0541) Weight:  [95.3 kg (210 lb 1.6 oz)] 95.3 kg (210 lb 1.6 oz) (04/23 0550)  General: chronically unwell and frail but obese, aged WM.   Head:  No facial edema.  Eyes:  No icterus or pallor Ears:  Slight HOH  Nose:  No discharge Mouth:  Clear, oink oral MM Neck:  No mass or JVD.  No thyromegaly Lungs:  Clear B.  No labored breathing.  Heart: Irreg, irreg, rate controlled.  Abdomen:  Protuberant but soft, not tender.  Large midline ventral and umbilical hernia.  Active BS.    Rectal: internal hemorrhoids are not bleeding.  Formed stool in vault is brown and FOB negative. GU:  No scrotal edema Musc/Skeltl: no joint swelling or redness.  Weakness of core muscles, difficult for him to get up from chair and onto bed.  Unable to lift legs onto bed.  Extremities:  2 + pedal/LE edema.  Violaceous coloring to lower legs.  Neurologic:  Oriented x 3. No tremor.  Poor balance Skin: large hematoma on left back. Tattoos:  None seen Nodes:  No inguinal adenopathy. Psych:  Pleasant, cooperative, relaxed.   Intake/Output from previous day: 04/22 0701 - 04/23 0700 In: 783 [P.O.:780; I.V.:3] Out: 1025 [Urine:1025] Intake/Output this shift: Total I/O In: 120 [P.O.:120] Out: 200 [Urine:200]  LAB RESULTS:  Recent Labs  03/23/13 1721  WBC 8.4  HGB 9.8*  HCT 29.6*  PLT 180   BMET Lab Results  Component Value Date   NA 141 03/25/2013   NA 138 03/25/2013   NA 139 03/24/2013   K 3.6 03/25/2013   K 3.9 03/25/2013   K 5.7* 03/24/2013   CL 100 03/25/2013   CL 99 03/25/2013   CL 100 03/24/2013   CO2 31 03/25/2013   CO2 29 03/25/2013   CO2 27 03/24/2013   GLUCOSE 107* 03/25/2013   GLUCOSE 114* 03/25/2013   GLUCOSE 104* 03/24/2013   BUN 34* 03/25/2013   BUN 35* 03/25/2013   BUN 28* 03/24/2013   CREATININE 1.51* 03/25/2013   CREATININE 1.58*  03/25/2013   CREATININE 1.30 03/24/2013   CALCIUM 8.9 03/25/2013   CALCIUM 8.8 03/25/2013   CALCIUM 9.0 03/24/2013   LFT  Recent Labs  03/23/13 1721  PROT 7.1  ALBUMIN 3.5  AST 21  ALT 22  ALKPHOS 63  BILITOT 1.4*   PT/INR Lab Results  Component Value Date   INR 1.17 03/23/2013   INR 1.05 06/04/2010   INR 1.0 07/26/2009     RADIOLOGY STUDIES: Dg Chest 2 View 03/24/2013    IMPRESSION: No acute cardiopulmonary process.   Original Report Authenticated By: Genevive Bi, M.D.     ENDOSCOPIC STUDIES: Colonoscopy in 1998-05-07 and 05/07/02.  Polyps found at least once.   IMPRESSION: *  Many years history of hemorrhoidal bleeding, has not led to ABL.  Now with recurrent a flutter which Dr Graciela Husbands would like to treat with short term anticoagulation.  Use of Lovenox since 4/21 admission has not led to recurrent bleeding.  Bleeding was not necessarily worse after taking Xarelto last month. Remote hx unspecified type of colon polyps Pt not a candidate for colonoscopy: too frail . *  Rate controlled A fib/flutter.  *  LE cellulitis, started on Ancef today.   PLAN: *  Per Dr Arlyce Dice   LOS: 2 days   Jennye Moccasin  03/25/2013, 12:52 PM Pager: [phone]  Pt seen and examined.  Rectal bleeding certainly is compatible with hemorrhoidal bleeding although we still must consider a more proximal bleeding source.  Since bleeding is so chronic and stable, would proceed with antiplatelet meds while monitoring Hg every 2 weeks.  In the interim I will place him on suppositories for  presumed hemorrhoidal bleeding and, as an outpatient, obtain a virtual CT which will r/o any significant lesions in the colon (in lieu of a colonoscopy or barium enema).  Ultimately, if bleeding persists despite medical therapy, he would be a candidate for band ligation.  This could be done once he is off antiplatelet meds.

## 2013-03-26 ENCOUNTER — Encounter: Payer: Medicare Other | Admitting: Internal Medicine

## 2013-03-26 DIAGNOSIS — L539 Erythematous condition, unspecified: Secondary | ICD-10-CM

## 2013-03-26 DIAGNOSIS — N179 Acute kidney failure, unspecified: Secondary | ICD-10-CM

## 2013-03-26 LAB — BASIC METABOLIC PANEL
CO2: 32 mEq/L (ref 19–32)
Chloride: 100 mEq/L (ref 96–112)
Glucose, Bld: 105 mg/dL — ABNORMAL HIGH (ref 70–99)
Sodium: 143 mEq/L (ref 135–145)

## 2013-03-26 MED ORDER — SODIUM CHLORIDE 0.9 % IV SOLN
INTRAVENOUS | Status: DC
  Administered 2013-03-26 – 2013-03-27 (×2): via INTRAVENOUS

## 2013-03-26 MED ORDER — FUROSEMIDE 10 MG/ML IJ SOLN
40.0000 mg | Freq: Two times a day (BID) | INTRAMUSCULAR | Status: DC
  Administered 2013-03-26 – 2013-03-27 (×3): 40 mg via INTRAVENOUS
  Filled 2013-03-26 (×2): qty 4

## 2013-03-26 NOTE — Progress Notes (Signed)
Patient Name: [name]      SUBJECTIVE: admitted with volume overload with relativiely new onset atrial fibrillation  Concerns re anticoagulation 2/2 bleeding ? Cause  GI saw yesterday and we started NOAC last pm with anticipation of TEEDCCV in am  Past Medical History  Diagnosis Date  . A-fib     paroxysmal, was on coumadin , pt desired to stop it , no longer a candidate per cards  . Tachy-brady syndrome   . CAD (coronary artery disease)     MI angioplasty 75, s/p CABG 01/2006- pacemaker  (12-2006), NSTMI  7-11  . CHF (congestive heart failure)   . Hyperlipidemia   . Hypertension   . Spinal stenosis      has seen Dr Ethelene Hal before, s/p local injection which helped x a while   . GERD (gastroesophageal reflux disease)   . RLS (restless legs syndrome)     ? of  . Vitamin B12 deficiency   . Insomnia   . History of BPH     TURP '96 at [facility] 96, urethoplast 97 ( History of bladder outlet obstruction, chronic incontinence)  . Myocardial infarction   . Pacemaker   . Shortness of breath   . Arthritis     back    PHYSICAL EXAM Filed Vitals:   03/25/13 0550 03/25/13 1500 03/25/13 2133 03/26/13 0548  BP:  119/65 124/57 133/55  Pulse:  86 88 90  Temp:  97.5 F (36.4 C) 97.6 F (36.4 C) 98 F (36.7 C)  TempSrc:  Oral Oral Oral  Resp:  20 20 20   Height:      Weight: 210 lb 1.6 oz (95.3 kg)   209 lb 1.6 oz (94.847 kg)  SpO2:  97% 99% 94%    Well developed and nourished in no acute distress HENT normal Neck supple with JVP >10 Clear but decreased Irrelularly irregular rate and rhythm, no murmurs or gallops Abd-soft with active BS No Clubbing cyanosis2+r  3+L edema   Skin-warm and dry; Left lower leg assymetrically swollen and warm  A & Oriented  Grossly normal sensory and motor function  TELEMETRY: Reviewed telemetry pt in AF and VPacing:    Intake/Output Summary (Last 24 hours) at 03/26/13 0736 Last data filed at 03/26/13 0657  Gross per 24 hour  Intake    120  ml  Output   1325 ml  Net  -1205 ml    LABS: Basic Metabolic Panel:  Recent Labs Lab 03/23/13 1721 03/24/13 0620 03/25/13 0001 03/25/13 0645 03/26/13 0525  NA 139 139 138 141 143  K 4.4 5.7* 3.9 3.6 3.6  CL 103 100 99 100 100  CO2 27 27 29 31  32  GLUCOSE 96 104* 114* 107* 105*  BUN 27* 28* 35* 34* 40*  CREATININE 1.26 1.30 1.58* 1.51* 1.57*  CALCIUM 8.7 9.0 8.8 8.9 8.9   Cardiac Enzymes: No results found for this basename: CKTOTAL, CKMB, CKMBINDEX, TROPONINI,  in the last 72 hours CBC:  Recent Labs Lab 03/23/13 1721  WBC 8.4  NEUTROABS 6.0  HGB 9.8*  HCT 29.6*  MCV 88.9  PLT 180   PROTIME:  Recent Labs  03/23/13 1721  LABPROT 14.7  INR 1.17   Liver Function Tests:  Recent Labs  03/23/13 1721  AST 21  ALT 22  ALKPHOS 63  BILITOT 1.4*  PROT 7.1  ALBUMIN 3.5   No results found for this basename: LIPASE, AMYLASE,  in the last 72 hours BNP: BNP (last 3  results)  Recent Labs  03/06/13 1850 03/23/13 1720  PROBNP 1704.0* 2160.0*     ASSESSMENT AND PLAN:  Principal Problem:   Acute on chronic diastolic heart failure Active Problems:   Atrial fibrillation   ANEMIA, MILD   HYPERTENSION   SYNCOPE   Coronary artery disease   Acute renal failure   Cellulitis of leg, left  willl have to back of diuretics today 2/2 increasing B/Cr Anticipate TEE-DCCV in am Continue abx  Signed, [name] MD  03/26/2013

## 2013-03-26 NOTE — Progress Notes (Signed)
No further rectal bleeding.  Anusol started.  Plan outpt followup in about 1 month.  Will reassess need for further Rx and w/u for rectal bleeding.

## 2013-03-27 ENCOUNTER — Encounter (HOSPITAL_COMMUNITY): Payer: Self-pay | Admitting: Anesthesiology

## 2013-03-27 ENCOUNTER — Encounter (HOSPITAL_COMMUNITY)
Admission: AD | Disposition: A | Discharge status: Home Health | Payer: Self-pay | Source: Ambulatory Visit | Attending: Cardiology

## 2013-03-27 ENCOUNTER — Encounter (HOSPITAL_COMMUNITY): Payer: Self-pay | Admitting: *Deleted

## 2013-03-27 ENCOUNTER — Inpatient Hospital Stay (HOSPITAL_COMMUNITY): Payer: Medicare Other | Admitting: Anesthesiology

## 2013-03-27 DIAGNOSIS — I4891 Unspecified atrial fibrillation: Secondary | ICD-10-CM

## 2013-03-27 HISTORY — PX: CARDIOVERSION: SHX1299

## 2013-03-27 HISTORY — PX: TEE WITHOUT CARDIOVERSION: SHX5443

## 2013-03-27 LAB — BASIC METABOLIC PANEL
BUN: 36 mg/dL — ABNORMAL HIGH (ref 6–23)
CO2: 33 mEq/L — ABNORMAL HIGH (ref 19–32)
Glucose, Bld: 106 mg/dL — ABNORMAL HIGH (ref 70–99)
Potassium: 3.4 mEq/L — ABNORMAL LOW (ref 3.5–5.1)
Sodium: 141 mEq/L (ref 135–145)

## 2013-03-27 SURGERY — ECHOCARDIOGRAM, TRANSESOPHAGEAL
Anesthesia: General

## 2013-03-27 SURGERY — ECHOCARDIOGRAM, TRANSESOPHAGEAL
Anesthesia: Moderate Sedation

## 2013-03-27 MED ORDER — FUROSEMIDE 40 MG PO TABS: 40.0000 mg | ORAL_TABLET | Freq: Two times a day (BID) | ORAL | Status: DC

## 2013-03-27 MED ORDER — FENTANYL CITRATE 0.05 MG/ML IJ SOLN: INTRAMUSCULAR | Status: DC | PRN

## 2013-03-27 MED ORDER — MIDAZOLAM HCL 5 MG/ML IJ SOLN
INTRAMUSCULAR | Status: AC
  Filled 2013-03-27: qty 2

## 2013-03-27 MED ORDER — CEPHALEXIN 500 MG PO CAPS: 500.0000 mg | ORAL_CAPSULE | Freq: Three times a day (TID) | ORAL | Status: DC

## 2013-03-27 MED ORDER — HYDROCORTISONE ACETATE 25 MG RE SUPP: 25.0000 mg | Freq: Two times a day (BID) | RECTAL | Status: DC

## 2013-03-27 MED ORDER — RIVAROXABAN 15 MG PO TABS: 15.0000 mg | ORAL_TABLET | Freq: Every day | ORAL | Status: DC

## 2013-03-27 MED ORDER — MIDAZOLAM HCL 10 MG/2ML IJ SOLN
INTRAMUSCULAR | Status: DC | PRN
  Administered 2013-03-27: 2 mg via INTRAVENOUS

## 2013-03-27 MED ORDER — SODIUM CHLORIDE 0.9 % IV SOLN
INTRAVENOUS | Status: DC | PRN
  Administered 2013-03-27: 11:00:00 via INTRAVENOUS

## 2013-03-27 MED ORDER — POTASSIUM CHLORIDE CRYS ER 20 MEQ PO TBCR
40.0000 meq | EXTENDED_RELEASE_TABLET | Freq: Once | ORAL | Status: AC
  Administered 2013-03-27: 40 meq via ORAL
  Filled 2013-03-27: qty 2

## 2013-03-27 MED ORDER — FENTANYL CITRATE 0.05 MG/ML IJ SOLN
INTRAMUSCULAR | Status: AC
  Filled 2013-03-27: qty 2

## 2013-03-27 MED ORDER — BUTAMBEN-TETRACAINE-BENZOCAINE 2-2-14 % EX AERO
INHALATION_SPRAY | CUTANEOUS | Status: DC | PRN
  Administered 2013-03-27: 2 via TOPICAL

## 2013-03-27 MED ORDER — PROPOFOL 10 MG/ML IV BOLUS
INTRAVENOUS | Status: DC | PRN
  Administered 2013-03-27: 100 mg via INTRAVENOUS

## 2013-03-27 NOTE — Preoperative (Signed)
Beta Blockers   Reason not to administer Beta Blockers:Not Applicable 

## 2013-03-27 NOTE — Anesthesia Preprocedure Evaluation (Signed)
Anesthesia Evaluation  Patient identified by MRN, date of birth, ID band Patient awake    Reviewed: Allergy & Precautions, H&P , NPO status , Patient's Chart, lab work & pertinent test results, reviewed documented beta blocker date and time   Airway Mallampati: II TM Distance: >3 FB Neck ROM: full    Dental   Pulmonary shortness of breath and with exertion,  breath sounds clear to auscultation        Cardiovascular hypertension, On Medications and On Home Beta Blockers + CAD, + Past MI, + CABG and +CHF negative cardio ROS  + dysrhythmias Atrial Fibrillation and Ventricular Tachycardia + pacemaker Rhythm:regular     Neuro/Psych negative neurological ROS  negative psych ROS   GI/Hepatic negative GI ROS, Neg liver ROS, GERD-  Medicated and Controlled,  Endo/Other  negative endocrine ROS  Renal/GU Renal disease  negative genitourinary   Musculoskeletal   Abdominal   Peds  Hematology  (+) anemia ,   Anesthesia Other Findings See surgeon's H&P   Reproductive/Obstetrics negative OB ROS                           Anesthesia Physical Anesthesia Plan  ASA: III  Anesthesia Plan: General   Post-op Pain Management:    Induction: Intravenous  Airway Management Planned: Mask  Additional Equipment:   Intra-op Plan:   Post-operative Plan:   Informed Consent: I have reviewed the patients History and Physical, chart, labs and discussed the procedure including the risks, benefits and alternatives for the proposed anesthesia with the patient or authorized representative who has indicated his/her understanding and acceptance.   Dental Advisory Given  Plan Discussed with: CRNA and Surgeon  Anesthesia Plan Comments:         Anesthesia Quick Evaluation

## 2013-03-27 NOTE — Progress Notes (Signed)
Pt NPO for TEE-DCCV tomorrow. Pt requesting pain medicine. MD notified and MD stated it is okay to give pt med with sips of water. WIll continue to monitor [name] Hamper, RN 03/27/2013 1:21 AM

## 2013-03-27 NOTE — Progress Notes (Signed)
   CARDIOLOGY ROUNDING NOTE    Patient Name: [name] Date of Encounter: 03-27-2013    SUBJECTIVE:Patient feels like he is close to baseline this morning.  Pending TEE/DCCV this am.  This morning, will receive third dose of Xarelto.  Weight down 1 pound since yesterday.   TELEMETRY: Reviewed telemetry pt in atrial fib/flutter with controlled ventricular response Filed Vitals:   03/26/13 1025 03/26/13 1446 03/26/13 2100 03/27/13 0506  BP: 120/62 126/77 111/57 130/70  Pulse: 90 89 79 78  Temp: 97.7 F (36.5 C) 97.8 F (36.6 C) 98 F (36.7 C) 97.3 F (36.3 C)  TempSrc: Oral Oral Oral Oral  Resp: 20 19 18 18   Height:      Weight:    208 lb 3.2 oz (94.439 kg)  SpO2: 96% 97% 99% 98%    Intake/Output Summary (Last 24 hours) at 03/27/13 0600 Last data filed at 03/27/13 0000  Gross per 24 hour  Intake 1042.83 ml  Output   1251 ml  Net -208.17 ml    LABS: Basic Metabolic Panel:  Recent Labs  16/10/96 0645 03/26/13 0525  NA 141 143  K 3.6 3.6  CL 100 100  CO2 31 32  GLUCOSE 107* 105*  BUN 34* 40*  CREATININE 1.51* 1.57*  CALCIUM 8.9 8.9   Radiology/Studies:  Dg Chest 2 View 03/24/2013  *RADIOLOGY REPORT*  Clinical Data: Congestive heart failure  CHEST - 2 VIEW  Comparison: 03/06/2013  Findings: Left-sided pacemaker overlies stable enlarged cardiac silhouette.  No effusion, infiltrate, pneumothorax.  Lungs are hyperinflated. Degenerative osteophytosis of the thoracic spine.  IMPRESSION: No acute cardiopulmonary process.   Original Report Authenticated By: Genevive Bi, M.D.    PHYSICAL EXAM Well developed and nourished in no acute distress HENT normal Neck supple with JVP-flat Carotids brisk and full without bruits Clear Irregularly irregular rate and rhythm with controlledv ventricular response, no murmurs or gallops Abd-soft with active BS without hepatomegaly No Clubbing cyanosis L>R edema with less warmth Skin-warm and dry A & Oriented  Grossly normal  sensory and motor function    Principal Problem:   Acute on chronic diastolic heart failure Active Problems:   ANEMIA, MILD   HYPERTENSION   Atrial fibrillation   SYNCOPE   Coronary artery disease   Acute renal failure   Cellulitis of leg, left    Will give  Rivaroxaban this am and check BMET  Reviewed w  [facility]   Plan home today afterwads Will need BMET/SW  in one week  LASIX 40 o bid at discharge Keflex x 7 days total

## 2013-03-27 NOTE — Transfer of Care (Addendum)
Immediate Anesthesia Transfer of Care Note  Patient: [name]  Procedure(s) Performed: Procedure(s): TRANSESOPHAGEAL ECHOCARDIOGRAM (TEE) (N/A) CARDIOVERSION (N/A)  Patient Location: Endoscopy Unit  Anesthesia Type:General  Level of Consciousness: sedated  Airway & Oxygen Therapy: Patient Spontanous Breathing and Patient connected to nasal cannula oxygen  Post-op Assessment: Report given to PACU RN and Post -op Vital signs reviewed and stable  Post vital signs: Reviewed and stable  Complications: No apparent anesthesia complications

## 2013-03-27 NOTE — H&P (View-Only) (Signed)
   CARDIOLOGY ROUNDING NOTE    Patient Name: [name] Date of Encounter: 03-27-2013    SUBJECTIVE:Patient feels like he is close to baseline this morning.  Pending TEE/DCCV this am.  This morning, will receive third dose of Xarelto.  Weight down 1 pound since yesterday.   TELEMETRY: Reviewed telemetry pt in atrial fib/flutter with controlled ventricular response Filed Vitals:   03/26/13 1025 03/26/13 1446 03/26/13 2100 03/27/13 0506  BP: 120/62 126/77 111/57 130/70  Pulse: 90 89 79 78  Temp: 97.7 F (36.5 C) 97.8 F (36.6 C) 98 F (36.7 C) 97.3 F (36.3 C)  TempSrc: Oral Oral Oral Oral  Resp: 20 19 18 18  Height:      Weight:    208 lb 3.2 oz (94.439 kg)  SpO2: 96% 97% 99% 98%    Intake/Output Summary (Last 24 hours) at 03/27/13 0600 Last data filed at 03/27/13 0000  Gross per 24 hour  Intake 1042.83 ml  Output   1251 ml  Net -208.17 ml    LABS: Basic Metabolic Panel:  Recent Labs  03/25/13 0645 03/26/13 0525  NA 141 143  K 3.6 3.6  CL 100 100  CO2 31 32  GLUCOSE 107* 105*  BUN 34* 40*  CREATININE 1.51* 1.57*  CALCIUM 8.9 8.9   Radiology/Studies:  Dg Chest 2 View 03/24/2013  *RADIOLOGY REPORT*  Clinical Data: Congestive heart failure  CHEST - 2 VIEW  Comparison: 03/06/2013  Findings: Left-sided pacemaker overlies stable enlarged cardiac silhouette.  No effusion, infiltrate, pneumothorax.  Lungs are hyperinflated. Degenerative osteophytosis of the thoracic spine.  IMPRESSION: No acute cardiopulmonary process.   Original Report Authenticated By: Stewart Edmunds, M.D.    PHYSICAL EXAM Well developed and nourished in no acute distress HENT normal Neck supple with JVP-flat Carotids brisk and full without bruits Clear Irregularly irregular rate and rhythm with controlledv ventricular response, no murmurs or gallops Abd-soft with active BS without hepatomegaly No Clubbing cyanosis L>R edema with less warmth Skin-warm and dry A & Oriented  Grossly normal  sensory and motor function    Principal Problem:   Acute on chronic diastolic heart failure Active Problems:   ANEMIA, MILD   HYPERTENSION   Atrial fibrillation   SYNCOPE   Coronary artery disease   Acute renal failure   Cellulitis of leg, left    Will give  Rivaroxaban this am and check BMET  Reviewed w  [facility]   Plan home today afterwads Will need BMET/SW  in one week  LASIX 40 o bid at discharge Keflex x 7 days total   

## 2013-03-27 NOTE — Interval H&P Note (Signed)
History and Physical Interval Note:  03/27/2013 10:26 AM  [name]  has presented today for surgery, with the diagnosis of afib  The various methods of treatment have been discussed with the patient and family. After consideration of risks, benefits and other options for treatment, the patient has consented to  Procedure(s): TRANSESOPHAGEAL ECHOCARDIOGRAM (TEE) (N/A) CARDIOVERSION (N/A) as a surgical intervention .  The patient's history has been reviewed, patient examined, no change in status, stable for surgery.  I have reviewed the patient's chart and labs.  Questions were answered to the patient's satisfaction.     Olga Millers

## 2013-03-27 NOTE — OR Nursing (Signed)
Anesthesia in to sedate for cardioversion. 

## 2013-03-27 NOTE — Progress Notes (Signed)
Echocardiogram Echocardiogram Transesophageal has been performed.  [name] 03/27/2013, 11:11 AM

## 2013-03-27 NOTE — CV Procedure (Signed)
See full TEE report in [employer]; no LAA thrombus identified; patient subsequently sedated by anesthesia with diprovan 100 mg IV; synchronized DCCV with 120 joules resulted in sinus rhythm. No immediate complications. Continue xeralto. Olga Millers

## 2013-03-27 NOTE — Progress Notes (Signed)
1720 discharge instructions and prescripton given to pt and spouse. Verbalized understanding . To lobby with family member

## 2013-03-27 NOTE — Anesthesia Postprocedure Evaluation (Signed)
  Anesthesia Post-op Note  Patient: [name]  Procedure(s) Performed: Procedure(s): TRANSESOPHAGEAL ECHOCARDIOGRAM (TEE) (N/A) CARDIOVERSION (N/A)  Patient Location: Endoscopy Unit  Anesthesia Type:General  Level of Consciousness: awake, alert , oriented and patient cooperative  Airway and Oxygen Therapy: Patient Spontanous Breathing and Patient connected to nasal cannula oxygen  Post-op Pain: none  Post-op Assessment: Post-op Vital signs reviewed, Patient's Cardiovascular Status Stable, Respiratory Function Stable, Patent Airway and No signs of Nausea or vomiting  Post-op Vital Signs: Reviewed and stable  Complications: No apparent anesthesia complications

## 2013-03-27 NOTE — Anesthesia Postprocedure Evaluation (Signed)
Anesthesia Post Note  Patient: [name]  Procedure(s) Performed: Procedure(s) (LRB): TRANSESOPHAGEAL ECHOCARDIOGRAM (TEE) (N/A) CARDIOVERSION (N/A)  Anesthesia type: General  Patient location: PACU  Post pain: Pain level controlled  Post assessment: Patient's Cardiovascular Status Stable  Last Vitals:  Filed Vitals:   03/27/13 1050  BP: 136/49  Pulse:   Temp:   Resp: 24    Post vital signs: Reviewed and stable  Level of consciousness: alert  Complications: No apparent anesthesia complications

## 2013-03-27 NOTE — Discharge Summary (Signed)
CARDIOLOGY DISCHARGE SUMMARY   Patient ID: [name] MRN: [id number] DOB/AGE: 77/10/1921 77 y.o.  Admit date: 03/23/2013 Discharge date: 03/27/2013  Primary Discharge Diagnosis:   Acute on chronic diastolic heart failure  Secondary Discharge Diagnosis:    ANEMIA, MILD   HYPERTENSION   Atrial fibrillation   SYNCOPE   Coronary artery disease   Acute renal failure   Cellulitis of leg, left  Consults: Gastroenterology, pharmacy, electrophysiology   Procedures: TEE, DCCV  Hospital Course: [name] is a 79 y.o. male with a history of CAD and atrial fib/flutter. When [name] was seen by Dr. Riley Kill on 03/17/2013, [name] was noted to be in a flutter and anticoagulated. However [name] developed hemorrhoidal bleeding and the anti-coagulation was discontinued. [name] was also treated for volume overload and noting class III dyspnea. Dr. Myrtis Ser recommended admission for IV diuresis and EP evaluation.  1. Acute on chronic diastolic heart failure: [name] was diuresed with IV Lasix. [name] lost 14 pounds over the course of his hospital stay. As his volume status improved, his respiratory status improved. By discharge, his oxygen saturation was 96% on room air. His Lasix dose was increased to twice a day and [name] is to have her early followup in the office.  2. Anemia: [name] has a long history of rectal bleeding the layered when [name] was placed on Xarelto. In the hospital, GI was consulted to assess his bleeding risk. Dr. Marzetta Board plan: Rectal bleeding certainly is compatible with hemorrhoidal bleeding although we still must consider a more proximal bleeding source. Since bleeding is so chronic and stable, would proceed with antiplatelet meds while monitoring Hg every 2 weeks. In the interim I will place him on suppositories for presumed hemorrhoidal bleeding and, as an outpatient, obtain a virtual CT which will r/o any significant lesions in the colon (in lieu of a colonoscopy or barium enema). Ultimately, if  bleeding persists despite medical therapy, [name] would be a candidate for band ligation. This could be done once [name] is off antiplatelet meds.  3. Hypertension: His blood pressure was monitored closely during his hospital stay. [name] was generally very well controlled on medications after adjustments were made. [name] has early followup in the office.  4. Atrial fibrillation: BP was consulted to manage his atrial fibrillation. [name] was seen by Dr. Graciela Husbands. Since GI had cleared him for Xarelto, Dr. Graciela Husbands felt that a TEE cardioversion was the best option. This was performed on 03/27/2013. [name] was successfully converted to sinus rhythm and is in sinus rhythm at discharge. TEE results are below.  5. Left lower extremity cellulitis: This left leg at the appearance of cellulitis. [name] was started on Ancef IV and received 6 doses this. His leg was improving and it was felt appropriate to change him to oral medications at discharge. [name] is to followup with primary care if this does not resolve.  6. Acute renal failure: On admission, his BUN was 27 with a creatinine 1.2. With diuresis, it increased to 40/1.57 at discharge. His diuretics are being changed back to oral medications at discharge and this will be followed next week.  7. Syncope: [name] had a recent history of syncope there was felt to be from orthostatic hypotension. [name] did not have any symptoms in the hospital.  8. Coronary artery disease: [name] had no ischemic symptoms in the hospital and his EF is preserved at the TEE. [name] will followup as an outpatient for this.  By 03/27/2013 p.m., [name] had recovered from the  sedation for the TEE cardioversion. [name] had had 3 doses of Xarelto and was tolerating it. Outpatient followup was arranged with cardiology and GI. Dr. Jens Som considered him stable for discharge, to keep all followup appointments.   Labs:  Lab Results  Component Value Date   WBC 8.4 03/23/2013   HGB 9.8* 03/23/2013   HCT 29.6* 03/23/2013   MCV 88.9 03/23/2013    PLT 180 03/23/2013    Recent Labs Lab 03/23/13 1721  03/27/13 1315  NA 139  < > 141  K 4.4  < > 3.4*  CL 103  < > 97  CO2 27  < > 33*  BUN 27*  < > 36*  CREATININE 1.26  < > 1.50*  CALCIUM 8.7  < > 8.9  PROT 7.1  --   --   BILITOT 1.4*  --   --   ALKPHOS 63  --   --   ALT 22  --   --   AST 21  --   --   GLUCOSE 96  < > 106*  < > = values in this interval not displayed.  Pro B Natriuretic peptide (BNP)  Date/Time Value Range Status  03/23/2013  5:20 PM 2160.0* 0 - 450 pg/mL Final  03/06/2013  6:50 PM 1704.0* 0 - 450 pg/mL Final     Radiology:  Dg Chest 2 View 03/06/2013  *RADIOLOGY REPORT*  Clinical Data: Lower extremity edema and history of CHF.  CHEST - 2 VIEW  Comparison: 09/14/2011  Findings: Stable cardiomegaly and radiographic appearance of pacemaker.  The lungs show stable chronic disease and no evidence of overt edema.  No pleural effusions are identified.  Stable degenerative changes are present in the thoracic spine.  IMPRESSION: Stable cardiomegaly.  Chronic lung disease without evidence of pulmonary edema.   Original Report Authenticated By: Irish Lack, M.D.    EKG:  24-Mar-2013 05:50:24  Atrial fibrillation Right bundle branch block Abnormal ECG 76mm/s 56mm/mV 100Hz  8.0.1 12SL 239 CID: 1 Referred by: Unconfirmed Vent. rate 97 BPM PR interval * ms QRS duration 140 ms QT/QTc 392/497 ms P-R-T axes * -29 54  Echo:  03/27/2013 Conclusions - Left ventricle: Systolic function was normal. The estimated ejection fraction was in the range of 50% to 55%. Akinesis of the basal/mid lateral wall. - Aortic valve: No evidence of vegetation. - Mitral valve: No evidence of vegetation. Mild regurgitation. - Left atrium: The atrium was moderately dilated. No evidence of thrombus in the atrial cavity or appendage. There was mild spontaneous echo contrast ("smoke") in the appendage. - Right atrium: The atrium was moderately dilated. - Atrial septum: No defect or patent  foramen ovale was identified. - Tricuspid valve: No evidence of vegetation. Moderate regurgitation. - Pulmonic valve: No evidence of vegetation.   FOLLOW UP PLANS AND APPOINTMENTS Allergies  Allergen Reactions  . Amlodipine Other (See Comments)    Feet swelled  . Atorvastatin Other (See Comments)     leg pain     Medication List    TAKE these medications       acetaminophen 500 MG tablet  Commonly known as:  TYLENOL  Take 500 mg by mouth every 8 (eight) hours as needed for pain.     aspirin EC 81 MG tablet  Take 81 mg by mouth daily.     b complex vitamins tablet  Take 1 tablet by mouth daily.     beta carotene w/minerals tablet  Take 1 tablet by mouth daily.  carvedilol 12.5 MG tablet  Commonly known as:  COREG  Take 1 tablet (12.5 mg total) by mouth 2 (two) times daily with a meal.     cephALEXin 500 MG capsule  Commonly known as:  KEFLEX  Take 1 capsule (500 mg total) by mouth 3 (three) times daily.     furosemide 40 MG tablet  Commonly known as:  LASIX  Take 1 tablet (40 mg total) by mouth 2 (two) times daily.     hydrocortisone 25 MG suppository  Commonly known as:  ANUSOL-HC  Place 1 suppository (25 mg total) rectally 2 (two) times daily.     potassium chloride 10 MEQ tablet  Commonly known as:  K-DUR  Take 10 mEq by mouth every evening.     Rivaroxaban 15 MG Tabs tablet  Commonly known as:  XARELTO  Take 1 tablet (15 mg total) by mouth daily.        Discharge Orders   Future Appointments Provider Department Dept Phone   04/07/2013 2:00 PM Rosalio Macadamia, NP [facility] [place]) [phone]   04/21/2013 9:15 AM Louis Meckel, MD [facility] [phone]   Future Orders Complete By Expires     (HEART FAILURE PATIENTS) Call MD:  Anytime you have any of the following symptoms: 1) 3 pound weight gain in 24 hours or 5 pounds in 1 week 2) shortness of breath, with or without a dry hacking cough 3) swelling in  the hands, feet or stomach 4) if you have to sleep on extra pillows at night in order to breathe.  As directed     Diet - low sodium heart healthy  As directed     Increase activity slowly  As directed       Follow-up Information   Follow up with Norma Fredrickson, NP On 04/07/2013. (See for Dr Graciela Husbands at 2:00 pm)    Contact information:   [address] [place] [address] [phone]       BRING ALL MEDICATIONS WITH YOU TO FOLLOW UP APPOINTMENTS  Time spent with patient to include physician time: 35 min  Signed: Theodore Demark, PA-C 03/27/2013, 4:57 PM Co-Sign MD

## 2013-03-28 NOTE — Discharge Summary (Signed)
See progress note by Dr Marcha Solders

## 2013-03-30 ENCOUNTER — Telehealth: Payer: Self-pay | Admitting: Internal Medicine

## 2013-03-30 ENCOUNTER — Encounter (HOSPITAL_COMMUNITY): Payer: Self-pay | Admitting: Cardiology

## 2013-03-30 DIAGNOSIS — E876 Hypokalemia: Secondary | ICD-10-CM

## 2013-03-30 NOTE — Telephone Encounter (Signed)
New Prob     Pt is experiencing leg pain and cant seem to get relief. Concerned and would like to speak to nurse.

## 2013-03-30 NOTE — Telephone Encounter (Signed)
Spoke with pt wife, the pt was discharged on Friday. He is having cramping in both legs. His current potassium pill is 10 meq once daily. His legs are still swollen, he is taking the antibiotics. She did have some hydrocodone that she gave him last night and it helped only a little. She is unsure if she should call us or his PCP. His potassium at discharge was 3.4, and his renal function was impaired. Will forward to dr Jens Som review

## 2013-03-30 NOTE — Telephone Encounter (Signed)
Discussed with lori gerhardt np, pt has a post hosp follow up with her next week. The pt will take an extra potassium pill tonight and will come to the office tomorrow for labs. Pt wife agreed with this plan.

## 2013-03-30 NOTE — Telephone Encounter (Signed)
Dr Graciela Husbands cared for this patient in hospital; I only performed procedure. Will forward to Dr Graciela Husbands. Olga Millers

## 2013-03-31 ENCOUNTER — Other Ambulatory Visit (INDEPENDENT_AMBULATORY_CARE_PROVIDER_SITE_OTHER): Payer: Medicare Other

## 2013-03-31 DIAGNOSIS — I2510 Atherosclerotic heart disease of native coronary artery without angina pectoris: Secondary | ICD-10-CM

## 2013-03-31 DIAGNOSIS — I10 Essential (primary) hypertension: Secondary | ICD-10-CM

## 2013-03-31 DIAGNOSIS — I4891 Unspecified atrial fibrillation: Secondary | ICD-10-CM

## 2013-03-31 DIAGNOSIS — E876 Hypokalemia: Secondary | ICD-10-CM

## 2013-03-31 LAB — BASIC METABOLIC PANEL
BUN: 37 mg/dL — ABNORMAL HIGH (ref 6–23)
CO2: 30 mEq/L (ref 19–32)
Calcium: 8.6 mg/dL (ref 8.4–10.5)
Chloride: 101 mEq/L (ref 96–112)
Creatinine, Ser: 1.5 mg/dL (ref 0.4–1.5)
GFR: 46.8 mL/min — ABNORMAL LOW (ref >60.00)
Glucose, Bld: 106 mg/dL — ABNORMAL HIGH (ref 70–99)
Potassium: 3.7 mEq/L (ref 3.5–5.1)
Sodium: 140 mEq/L (ref 135–145)

## 2013-03-31 LAB — HEPATIC FUNCTION PANEL
AST: 51 U/L — ABNORMAL HIGH (ref 0–37)
Alkaline Phosphatase: 56 U/L (ref 39–117)
Total Bilirubin: 1.5 mg/dL — ABNORMAL HIGH (ref 0.3–1.2)

## 2013-03-31 LAB — MAGNESIUM: Magnesium: 2.5 mg/dL (ref 1.5–2.5)

## 2013-03-31 IMAGING — CT CT CERVICAL SPINE W/O CM
4 of 5 series · 9 of 20 positions shown, 10 images · non-contrast
Comparison: Cervical radiographs 09/09/2012

CLINICAL DATA: Fall 1 month ago.  Right-sided neck pain

CT CERVICAL SPINE WITHOUT CONTRAST
TECHNIQUE: Multidetector CT imaging of the cervical spine was
performed. Multiplanar CT image reconstructions were also
generated.

[Series 3: c spine bone · axial · 0.23mm/px · z∈[+138,+232]mm · 3 of 76 slices shown, 4 images]
[im 19/76  soft-tissue]
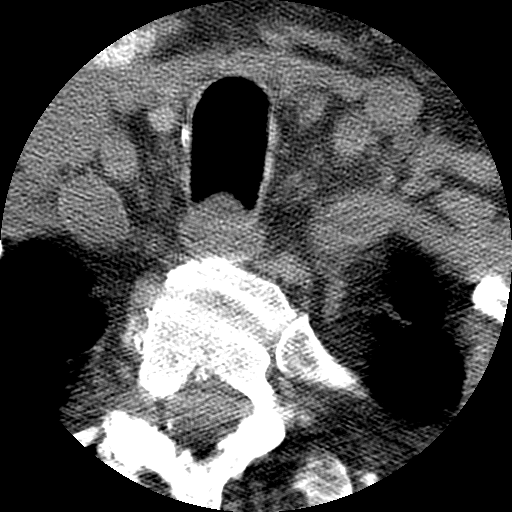
[im 19/76  bone]
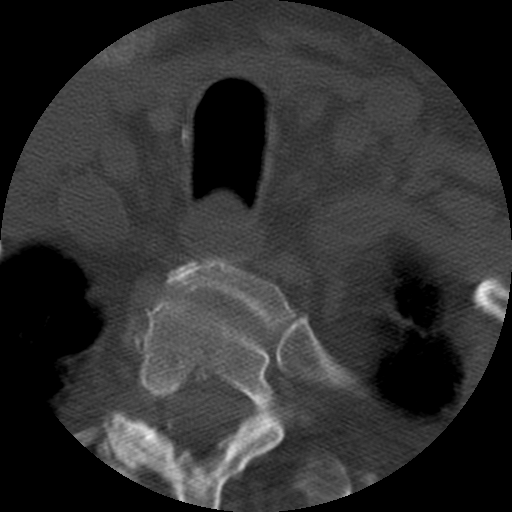
[im 38/76  bone]
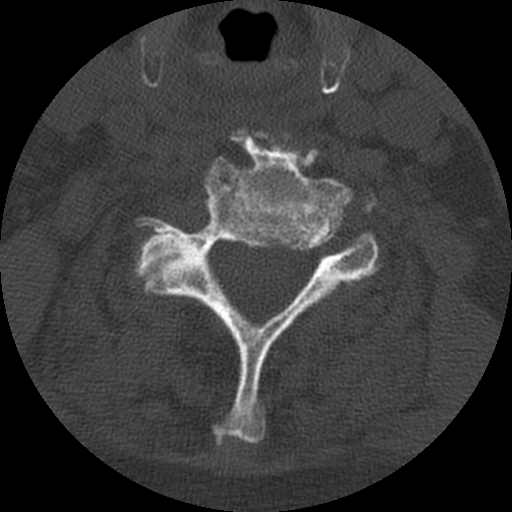
[im 57/76  bone]
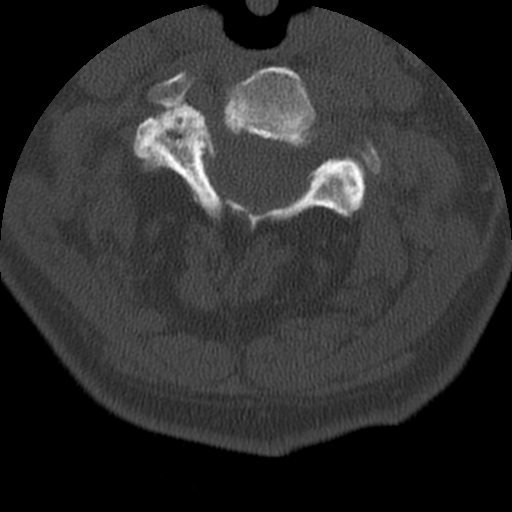

[Series 4: c spine soft · axial · 0.23mm/px · z∈[+155,+218]mm · 2 of 74 slices shown]
[im 25/74  soft-tissue]
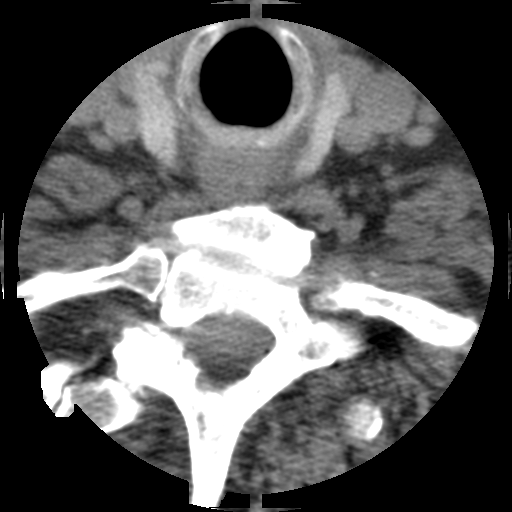
[im 49/74  soft-tissue]
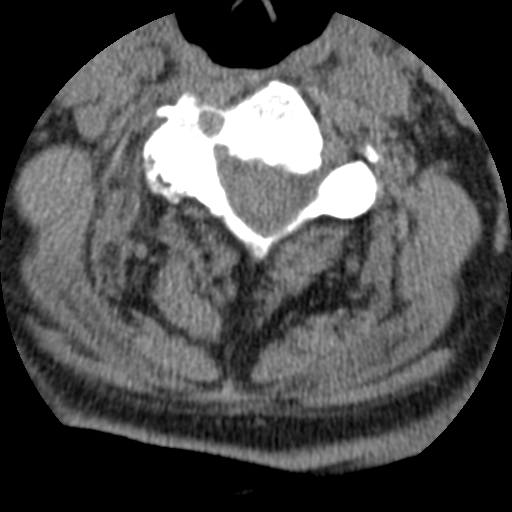

[Series 200: cor · coronal · 0.38mm/px · 1 of 36 slices shown]
[im 18/36  bone]
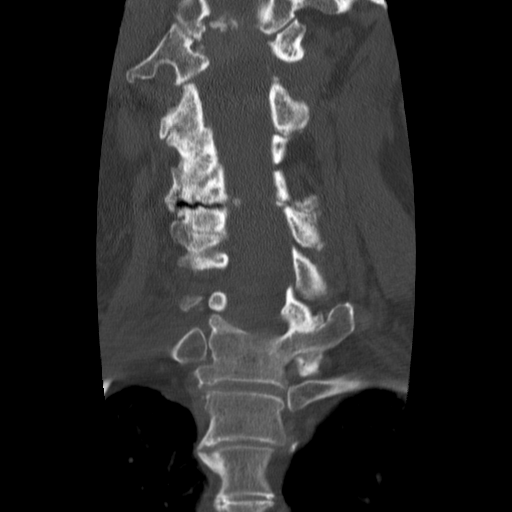

[Series 202: angled axial · axial · 0.23mm/px · z∈[+133,+220]mm · 3 of 95 slices shown]
[im 24/95  bone]
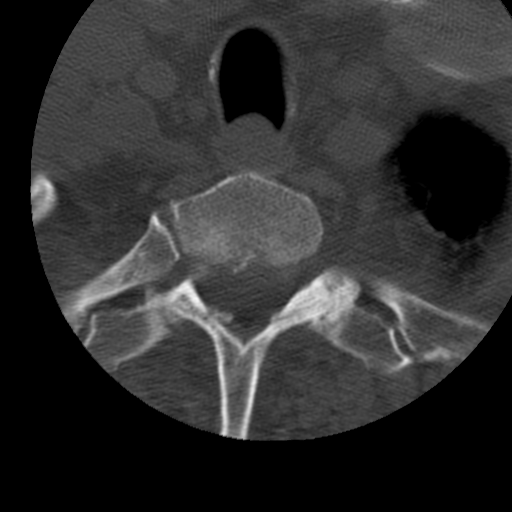
[im 48/95  bone]
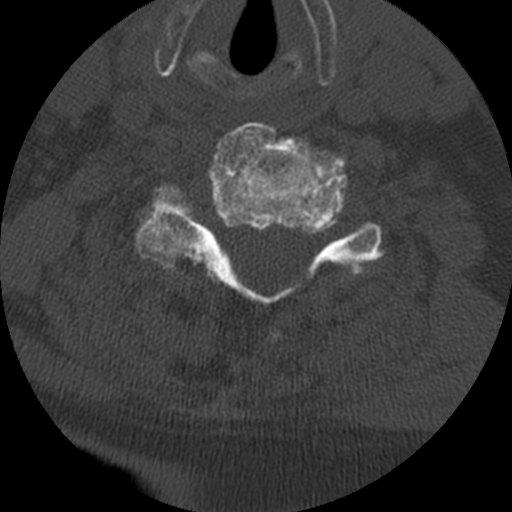
[im 71/95  bone]
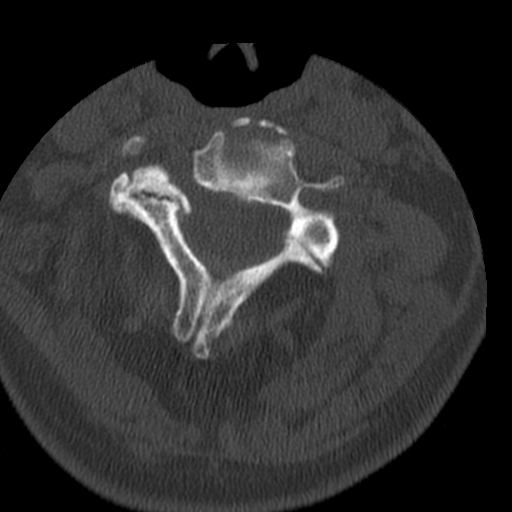

[9 of 20 positions shown; findings below may reference images not displayed]

FINDINGS: Negative for fracture or mass lesion.  There is extensive
degenerative change in the cervical spine.

C1-2:  Irregularity of  the dens with erosions.  There is narrowing
of the atlanto dens interval.  The findings are compatible with
severe arthritic change which could be due to rheumatoid arthritis
or CPPD.  Mild pannus formation is present.

C2-3:  Mild disc degeneration and spondylosis.  There is moderate
to advanced facet hypertrophy on the right without significant
foraminal encroachment.

C3-4:  Disc degeneration and mild spondylosis.  Facet degeneration,
right greater than left without significant spinal stenosis or
foraminal stenosis.

C4-5:  3 mm anterior slip.  There is severe facet degeneration on
the right with right foraminal encroachment.  There is moderate
left facet degeneration.

C5-6:  Disc degeneration and spondylosis with mild foraminal
narrowing bilaterally.  Moderate facet hypertrophy on the right.

C6-7:  Severe disc degeneration with spondylosis but no significant
spinal stenosis.

C7-T1:  3 mm anterior slip with bilateral facet degeneration.
IMPRESSION: Severe degenerative changes in the cervical spine as above.
Negative for fracture.

## 2013-04-03 ENCOUNTER — Telehealth: Payer: Self-pay | Admitting: Internal Medicine

## 2013-04-03 NOTE — Telephone Encounter (Signed)
Needs a check up to be sure sx are RLS, arrange a OV for next week

## 2013-04-03 NOTE — Telephone Encounter (Signed)
Please advise 

## 2013-04-03 NOTE — Telephone Encounter (Signed)
Spoke with pt wife, aware labs are normal. Referred pt to primary care for meds to help with restless leg symptoms and pain.

## 2013-04-03 NOTE — Telephone Encounter (Signed)
New problem   Pt want to know results of his labs and pt is also having extreme pain,swelling both legs and feet. Please call pt

## 2013-04-03 NOTE — Telephone Encounter (Signed)
Patient's wife states the patient needs something for his restless leg syndrome. Dr. Graciela Husbands will not treat it. Pt uses [facility] on [address]

## 2013-04-06 ENCOUNTER — Ambulatory Visit (INDEPENDENT_AMBULATORY_CARE_PROVIDER_SITE_OTHER): Payer: Medicare Other | Admitting: Internal Medicine

## 2013-04-06 ENCOUNTER — Other Ambulatory Visit: Payer: Self-pay | Admitting: *Deleted

## 2013-04-06 VITALS — BP 120/72 | HR 60 | Temp 98.2°F | Wt 212.0 lb

## 2013-04-06 DIAGNOSIS — L03116 Cellulitis of left lower limb: Secondary | ICD-10-CM

## 2013-04-06 DIAGNOSIS — G2581 Restless legs syndrome: Secondary | ICD-10-CM

## 2013-04-06 DIAGNOSIS — L02419 Cutaneous abscess of limb, unspecified: Secondary | ICD-10-CM

## 2013-04-06 DIAGNOSIS — L03119 Cellulitis of unspecified part of limb: Secondary | ICD-10-CM

## 2013-04-06 DIAGNOSIS — D649 Anemia, unspecified: Secondary | ICD-10-CM

## 2013-04-06 DIAGNOSIS — Z79899 Other long term (current) drug therapy: Secondary | ICD-10-CM

## 2013-04-06 DIAGNOSIS — R269 Unspecified abnormalities of gait and mobility: Secondary | ICD-10-CM

## 2013-04-06 MED ORDER — GABAPENTIN 300 MG PO CAPS: 300.0000 mg | ORAL_CAPSULE | Freq: Every day | ORAL | Status: DC

## 2013-04-06 MED ORDER — FERROUS SULFATE 325 (65 FE) MG PO TABS: 325.0000 mg | ORAL_TABLET | Freq: Three times a day (TID) | ORAL | Status: DC

## 2013-04-06 NOTE — Patient Instructions (Addendum)
Start Gabapentin 300 mg one tablet at nighttime. Watch for excessive somnolence. iron tablets 3 times a day. One multivitamin daily. Return the  Hemoccult to this office as soon as possible. Please come back in one week.

## 2013-04-06 NOTE — Assessment & Plan Note (Addendum)
I check point-of-care hemoglobin today : 7.9, almost 2 points lower than last Hg at the hospital despite no recent  rectal bleeding. Case discussed with GI, in the absence of overt bleeding would need to do a Hemoccult and monitor his hemoglobin closely. Plan: Confirmed point-of-care results with a CBC. Also check a thyroid, B12, folic acid. Start iron supplementation. Recheck in one week Hemoccult provided Planning to let  cardiology know about his situation, fortunately despite the anemia he denies chest pain and is not orthostatic.

## 2013-04-06 NOTE — Progress Notes (Signed)
Subjective:    Patient ID: [name], male    DOB: 02/21/1921, 79 y.o.   MRN: [id number]  HPI Last visit 09-2012 Since the last time I saw him, has been closely followed by cardiology for a number of cardiovascular issues.. Was admitted to the hospital 03/23/2013 discharge 4/25: Had CHF, he was diuresed 14 pounds, O2 sats after diuresis ~ 96%. Have Rectal bleeding, felt to be hemorrhoidal, they recommended to check a hemoglobin every 2 weeks. Hypertension was well controlled. Atrial fibrillation, status post TEE, patient was cardioverted. Recent history of syncopes, felt to be orthostatic. Last creatinine 1.5, last hemoglobin 03-23-2013----> 9.8  Past Medical History  Diagnosis Date  . A-fib     paroxysmal, was on coumadin , pt desired to stop it , no longer a candidate per cards  . Tachy-brady syndrome   . CAD (coronary artery disease)     MI angioplasty 81, s/p CABG 01/2006- pacemaker  (12-2006), NSTMI  7-11  . CHF (congestive heart failure)   . Hyperlipidemia   . Hypertension   . Spinal stenosis      has seen Dr Ethelene Hal before, s/p local injection which helped x a while   . GERD (gastroesophageal reflux disease)   . RLS (restless legs syndrome)     ? of  . Vitamin B12 deficiency   . Insomnia   . History of BPH     TURP '96 at [facility] 96, urethoplast 97 ( History of bladder outlet obstruction, chronic incontinence)  . Myocardial infarction   . Pacemaker   . Shortness of breath   . Arthritis     back   Past Surgical History  Procedure Laterality Date  . Coronary artery bypass graft    . Pacemaker insertion      12-2006 PPM Medtronic  . Turp vaporization      x 2  . Inguinal hernia repair      Right  . Replacement total knee  2005, 2006    right 2005, Left 2006.  Dr  Despina Hick  . Carpal tunnel release  03/2004    bilateral.  Dr Teressa Senter  . Insert / replace / remove pacemaker    . Tee without cardioversion N/A 03/27/2013    Procedure: TRANSESOPHAGEAL ECHOCARDIOGRAM  (TEE);  Surgeon: Lewayne Bunting, MD;  Location: [facility] ENDOSCOPY;  Service: Cardiovascular;  Laterality: N/A;  . Cardioversion N/A 03/27/2013    Procedure: CARDIOVERSION;  Surgeon: Lewayne Bunting, MD;  Location: [facility] ENDOSCOPY;  Service: Cardiovascular;  Laterality: N/A;     Review of Systems Chief complaint today is actually RLS, states he has it for years , described as leg discomfort, worse at night, decreased to some extent by moving his legs. As far as the rectal bleeding, sx are "not too bad lately". Denies abdominal pain, nausea, vomiting. No diarrhea, stools are brown, no melena, no actual red blood per rectum recently. He did have a couple of falls since he left the hospital, the wife reports they were mechanical falls, he did bump his head with a door but there was no bleeding, lost consciousness, currently denies headache-neck or back pain. Denies orthostatic dizziness. Also, she was diagnosed with cellulitis, status post antibiotics, reports leg is looking a little better.     Objective:   Physical Exam  General -- alert, well-developed,No apparent distress. BP sitting 122/68, BP standing 116/62  Lungs -- normal respiratory effort, no intercostal retractions, no accessory muscle use, and normal breath sounds.   Heart-- normal  rate, regular rhythm, no murmur, and no gallop.   Abdomen--soft, non-tender  Extremities-- Some edema, slightly worse on the left; L leg also slightly hyperpigmented but no warm or red. Rectal-- No external abnormalities noted. Normal sphincter tone. No rectal masses or tenderness. No mucos,blood or stool found Prostate:  Prostate gland firm and smooth, no enlargement, nodularity, tenderness, mass, asymmetry or induration. Psych-- Cognition and judgment appear intact. Alert and cooperative with normal attention span and concentration.  not anxious appearing and not depressed appearing.       Assessment & Plan:  Today , I spent more than 50 min with the  patient, >50% of the time counseling,  reviewing the chart and labs ordered by other providers

## 2013-04-06 NOTE — Assessment & Plan Note (Addendum)
Sx c/w RLS probably worse due to anemia (see Anemia) Start Neurontin, watch for somnolence

## 2013-04-06 NOTE — Assessment & Plan Note (Signed)
Currently using a cane, strongly recommend to get a walker. Refer to physical therapy for outpatient treatment. Risk for severe injury due to current use of xarelto discussed

## 2013-04-06 NOTE — Assessment & Plan Note (Signed)
Status post antibiotics, legs look better according to the patient

## 2013-04-07 ENCOUNTER — Encounter: Payer: Medicare Other | Admitting: Nurse Practitioner

## 2013-04-07 LAB — CBC WITH DIFFERENTIAL/PLATELET
Basophils Absolute: 0 10*3/uL (ref 0.0–0.1)
Eosinophils Relative: 3.9 % (ref 0.0–5.0)
HCT: 29.9 % — ABNORMAL LOW (ref 39.0–52.0)
Hemoglobin: 10.1 g/dL — ABNORMAL LOW (ref 13.0–17.0)
Lymphs Abs: 0.9 10*3/uL (ref 0.7–4.0)
MCV: 89.5 fl (ref 78.0–100.0)
Monocytes Absolute: 0.8 10*3/uL (ref 0.1–1.0)
Neutro Abs: 7.2 10*3/uL (ref 1.4–7.7)
Platelets: 263 10*3/uL (ref 150.0–400.0)
RDW: 14.9 % — ABNORMAL HIGH (ref 11.5–14.6)

## 2013-04-07 LAB — FOLATE: Folate: >24.8 ng/mL (ref >5.9)

## 2013-04-07 LAB — IRON: Iron: 33 ug/dL — ABNORMAL LOW (ref 42–165)

## 2013-04-08 ENCOUNTER — Encounter: Payer: Self-pay | Admitting: Internal Medicine

## 2013-04-08 ENCOUNTER — Telehealth: Payer: Self-pay | Admitting: General Practice

## 2013-04-08 NOTE — Telephone Encounter (Signed)
Pt wife called stating that pt is having a reaction to Gabapentin. Woke up last night and legs were jumping around, jerking, confused, could not find way to bathroom, could not sleep. Had confusion through breakfast finally started acting like himself around 6:30 or 7:00 am. Please advise.

## 2013-04-08 NOTE — Telephone Encounter (Addendum)
Stop gabapentin Watch him closely, if he Gets confused again let me know. Call in few days and let me know how he is doing, we'll have to think about another medication for his symptoms.

## 2013-04-08 NOTE — Telephone Encounter (Signed)
Pt wife notified, and advised to call us on Friday to inform how pt is doing.

## 2013-04-09 ENCOUNTER — Telehealth: Payer: Self-pay | Admitting: *Deleted

## 2013-04-09 ENCOUNTER — Encounter: Payer: Self-pay | Admitting: Cardiology

## 2013-04-09 ENCOUNTER — Ambulatory Visit (INDEPENDENT_AMBULATORY_CARE_PROVIDER_SITE_OTHER): Payer: Medicare Other | Admitting: Cardiology

## 2013-04-09 VITALS — BP 124/66 | HR 60 | Ht 69.0 in | Wt 211.0 lb

## 2013-04-09 DIAGNOSIS — D649 Anemia, unspecified: Secondary | ICD-10-CM

## 2013-04-09 DIAGNOSIS — I5033 Acute on chronic diastolic (congestive) heart failure: Secondary | ICD-10-CM

## 2013-04-09 DIAGNOSIS — Z950 Presence of cardiac pacemaker: Secondary | ICD-10-CM

## 2013-04-09 DIAGNOSIS — I2510 Atherosclerotic heart disease of native coronary artery without angina pectoris: Secondary | ICD-10-CM

## 2013-04-09 DIAGNOSIS — Z79899 Other long term (current) drug therapy: Secondary | ICD-10-CM

## 2013-04-09 DIAGNOSIS — I4891 Unspecified atrial fibrillation: Secondary | ICD-10-CM

## 2013-04-09 LAB — BASIC METABOLIC PANEL
CO2: 29 mEq/L (ref 19–32)
Calcium: 8.4 mg/dL (ref 8.4–10.5)
Chloride: 101 mEq/L (ref 96–112)
Glucose, Bld: 104 mg/dL — ABNORMAL HIGH (ref 70–99)
Sodium: 140 mEq/L (ref 135–145)

## 2013-04-09 LAB — HEPATIC FUNCTION PANEL
ALT: 20 U/L (ref 0–53)
Albumin: 3.6 g/dL (ref 3.5–5.2)
Alkaline Phosphatase: 52 U/L (ref 39–117)
Bilirubin, Direct: 0.1 mg/dL (ref 0.0–0.3)
Total Protein: 7.1 g/dL (ref 6.0–8.3)

## 2013-04-09 NOTE — Progress Notes (Signed)
HPI:  The patient comes in for follow up.  He was admitted for worsening CHF type symptoms, and was eventually cardioverted.  Prior to that time, he had been placed on a novel anticoagulant, but developed hemorrhoidal bleeding.  With that, the drug was stopped.  When he was cardioverted, he was replaced on Xarelto, and has tolerated relatively well.  He is not having any chest pain at present.  He has not had chest pain, but two days ago felt sleepy, and fell over hitting his head.  His r neck has been mildly stiff, but no other symptoms.  EMS came out and assessed him, decided he was ok.  He has had no change in status.  He said he hit his head on the carpet, and he has not had a severe headache or other major neuro symptoms.  Recent CBC was done in Dr. Leta Jungling office as noted.    Current Outpatient Prescriptions  Medication Sig Dispense Refill  . acetaminophen (TYLENOL) 500 MG tablet Take 500 mg by mouth every 8 (eight) hours as needed for pain.      Marland Kitchen aspirin EC 81 MG tablet Take 81 mg by mouth daily.      Marland Kitchen b complex vitamins tablet Take 1 tablet by mouth daily.      . beta carotene w/minerals (OCUVITE) tablet Take 1 tablet by mouth daily.      . carvedilol (COREG) 12.5 MG tablet Take 1 tablet (12.5 mg total) by mouth 2 (two) times daily with a meal.  60 tablet  6  . ferrous sulfate 325 (65 FE) MG tablet Take 1 tablet (325 mg total) by mouth 3 (three) times daily with meals.  90 tablet  3  . furosemide (LASIX) 40 MG tablet Take 1 tablet (40 mg total) by mouth 2 (two) times daily.  60 tablet  11  . potassium chloride (K-DUR) 10 MEQ tablet Take 10 mEq by mouth every evening.      . Rivaroxaban (XARELTO) 15 MG TABS tablet Take 1 tablet (15 mg total) by mouth daily.  30 tablet  11   No current facility-administered medications for this visit.    Allergies  Allergen Reactions  . Amlodipine Other (See Comments)    Feet swelled  . Atorvastatin Other (See Comments)     leg pain    Past Medical  History  Diagnosis Date  . A-fib     paroxysmal, was on coumadin , pt desired to stop it , no longer a candidate per cards  . Tachy-brady syndrome   . CAD (coronary artery disease)     MI angioplasty 74, s/p CABG 01/2006- pacemaker  (12-2006), NSTMI  7-11  . CHF (congestive heart failure)   . Hyperlipidemia   . Hypertension   . Spinal stenosis      has seen Dr Ethelene Hal before, s/p local injection which helped x a while   . GERD (gastroesophageal reflux disease)   . RLS (restless legs syndrome)     ? of  . Vitamin B12 deficiency   . Insomnia   . History of BPH     TURP '96 at [facility] 96, urethoplast 97 ( History of bladder outlet obstruction, chronic incontinence)  . Myocardial infarction   . Pacemaker   . Shortness of breath   . Arthritis     back    Past Surgical History  Procedure Laterality Date  . Coronary artery bypass graft    . Pacemaker insertion  12-2006 PPM Medtronic  . Turp vaporization      x 2  . Inguinal hernia repair      Right  . Replacement total knee  2005, 2006    right 2005, Left 2006.  Dr  Despina Hick  . Carpal tunnel release  03/2004    bilateral.  Dr Teressa Senter  . Insert / replace / remove pacemaker    . Tee without cardioversion N/A 03/27/2013    Procedure: TRANSESOPHAGEAL ECHOCARDIOGRAM (TEE);  Surgeon: Lewayne Bunting, MD;  Location: [facility] ENDOSCOPY;  Service: Cardiovascular;  Laterality: N/A;  . Cardioversion N/A 03/27/2013    Procedure: CARDIOVERSION;  Surgeon: Lewayne Bunting, MD;  Location: [facility] ENDOSCOPY;  Service: Cardiovascular;  Laterality: N/A;    Family History  Problem Relation Age of Onset  . Heart disease Neg Hx   . Diabetes Neg Hx     History   Social History  . Marital Status: Married    Spouse Name: N/A    Number of Children: N/A  . Years of Education: N/A   Occupational History  . Not on file.   Social History Main Topics  . Smoking status: Former Games developer  . Smokeless tobacco: Never Used  . Alcohol Use: No  . Drug Use: No  .  Sexually Active: Not on file   Other Topics Concern  . Not on file   Social History Narrative  . No narrative on file    ROS: Please see the HPI.  All other systems reviewed and negative.  PHYSICAL EXAM:  BP 124/66  Pulse 60  Ht 5\' 9"  (1.753 m)  Wt 211 lb (95.709 kg)  BMI 31.15 kg/m2  SpO2 98%  General: Well developed, well nourished, in no acute distress. Head:  Normocephalic.  Minimal echymossis on the occiput.  No major hematoma Neck: no JVD Lungs: Clear to auscultation and percussion. Heart: Normal S1 and S2.  No murmur, rubs or gallops.  Abdomen:  Normal bowel sounds; soft; non tender; no organomegaly Pulses: Pulses normal in all 4 extremities. Extremities: No clubbing or cyanosis. Trace LE edema.   Neurologic: Alert and oriented x 3.  EKG:  AV pacing.  TEE:     Study Conclusions  - Left ventricle: Systolic function was normal. The estimated ejection fraction was in the range of 50% to 55%. Akinesis of the basal/mid lateral wall. - Aortic valve: No evidence of vegetation. - Mitral valve: No evidence of vegetation. Mild regurgitation. - Left atrium: The atrium was moderately dilated. No evidence of thrombus in the atrial cavity or appendage. There was mild spontaneous echo contrast ("smoke") in the appendage. - Right atrium: The atrium was moderately dilated. - Atrial septum: No defect or patent foramen ovale was identified. - Tricuspid valve: No evidence of vegetation. Moderate regurgitation. - Pulmonic valve: No evidence of vegetation.    ASSESSMENT AND PLAN:  1.  Atrial fib sp cardioversion on Xarelto 2.  History prior CABG 3.  Advanced age 79.  Recent head trauma from fall with use of anticoagulant.

## 2013-04-09 NOTE — Addendum Note (Signed)
Addended by: Tonita Phoenix on: 04/09/2013 12:42 PM   Modules accepted: Orders

## 2013-04-09 NOTE — Addendum Note (Signed)
Addended by: Iona Coach on: 04/09/2013 12:42 PM   Modules accepted: Orders

## 2013-04-09 NOTE — Telephone Encounter (Signed)
Amber w/ [facility] called requesting verbal orders for PT & OT for pt. Per Dr. Harrel Carina to do PT & OT.

## 2013-04-09 NOTE — Assessment & Plan Note (Signed)
This nice gentleman was recently admitted with an atrial rhythm abnormality and worsening volume overload.  He is doing better after cardioversion and has tolerated his Xarelto.  Of note, he fell the other day, and is at high risk for falls.  In addition, he has had hemorrhoidal bleeding.  I will see him back on June 6, reassess his status, and have made it clear to his wife if he should have any progressive neuro symptoms he should come in promptly.  She understands.  We will also terminated his ASA.

## 2013-04-09 NOTE — Patient Instructions (Addendum)
Your physician has recommended you make the following change in your medication: STOP Aspirin  Your physician recommends that you have lab work today: BMP and LIVER  Your physician recommends that you schedule a follow-up appointment: May 08, 2013 at 10:00 with Dr Riley Kill

## 2013-04-09 NOTE — Assessment & Plan Note (Signed)
Need to monitor closely.  We are stopping ASA as he is on Xarelto.

## 2013-04-09 NOTE — Assessment & Plan Note (Signed)
stable °

## 2013-04-09 NOTE — Assessment & Plan Note (Signed)
This seems improved.  His weight has not changed from dc from the hospital, at 211.  He will remain on twice daily furosemide, and KCL replacement.  He will probably be mildly orthostatic, so weight and volume will need to be monitored.  Have suggested strongly that they begin considering assisted living as I think they are probably a little unsafe together.  We are not changing his dose of diuretics as his weight has remained stable.

## 2013-04-13 ENCOUNTER — Telehealth: Payer: Self-pay | Admitting: Internal Medicine

## 2013-04-13 NOTE — Telephone Encounter (Signed)
Tresa Endo of [facility] is calling.  She saw patient in his home today.  Prior to her arrival, patient fell today while in the restroom, hit his forehead on the sink area creating a small cut that bled quite a bit.  Patient's wife was able to stop the bleeding and dress the wound prior to [facility] arrival.  Patient had prior skin tear on his left arm that was also torn again during this fall.  Tresa Endo states patient is acting fine, alert & oriented.  No neurologic changes.  Please advise.

## 2013-04-13 NOTE — Telephone Encounter (Signed)
I am concerned about the fall and the patient taking xarelto, I discussed the case informally with cardiology (DOD) he agreed that is appropriate to stop xarelto. I spoke with the wife, told her  my concerns:  risk of stroke without Xarelto, risk of a intracranial bleeding or other severe bleeding with it. Fortunately after the fall today he feels normal. We agreed on: Discontinue xarelto and start aspirin 81 mg. --- Please call Tresa Endo at [facility] : Explain her above They need to call us immediately or go to the ER if headache, nausea, vomiting or mental status changes.

## 2013-04-13 NOTE — Telephone Encounter (Signed)
Please advise 

## 2013-04-13 NOTE — Telephone Encounter (Signed)
Discussed with April.  Pt scheduled appt tomorrow w/ Dr. Drue Novel

## 2013-04-14 ENCOUNTER — Ambulatory Visit (INDEPENDENT_AMBULATORY_CARE_PROVIDER_SITE_OTHER): Payer: Medicare Other | Admitting: Internal Medicine

## 2013-04-14 VITALS — BP 120/72 | HR 60 | Temp 98.2°F | Wt 208.0 lb

## 2013-04-14 DIAGNOSIS — G2581 Restless legs syndrome: Secondary | ICD-10-CM

## 2013-04-14 DIAGNOSIS — I4891 Unspecified atrial fibrillation: Secondary | ICD-10-CM

## 2013-04-14 DIAGNOSIS — R269 Unspecified abnormalities of gait and mobility: Secondary | ICD-10-CM

## 2013-04-14 DIAGNOSIS — K625 Hemorrhage of anus and rectum: Secondary | ICD-10-CM

## 2013-04-14 MED ORDER — CLONAZEPAM 0.25 MG PO TBDP: 0.2500 mg | ORAL_TABLET | Freq: Every evening | ORAL | Status: DC | PRN

## 2013-04-14 NOTE — Assessment & Plan Note (Signed)
Couldn't tolerate Neurontin, symptoms are still quite bothersome. Plan: low dose of clonazepam, discontinuine it if severe somnolence, see Instructions.

## 2013-04-14 NOTE — Assessment & Plan Note (Addendum)
Had a fall yesterday, to see physical therapy at home very soon. Again encouraged consistent use of a walker

## 2013-04-14 NOTE — Assessment & Plan Note (Signed)
Minimal bleeding at this point. Anemia is stable. Continue with iron.

## 2013-04-14 NOTE — Patient Instructions (Addendum)
Start clonazepam to help the restless leg. Start one tablet at bedtime, you can go up to 2 tablets if needed. If it is not helping or it causes excessive somnolence, stop clonazepam completely. Next visit with me in 6 weeks. Let me know if the hemorrhoidal bleeding increases.

## 2013-04-14 NOTE — Progress Notes (Signed)
  Subjective:    Patient ID: [name], male    DOB: [birth year], 79 y.o.   MRN: [id number]  HPI Followup from previous visit, here with his wife. RLS, tried Neurontin, couldn't tolerate it, got fidgety and restless. Gait disorder,Yesterday had another fall, hit  his head, xarelto was discontinued, see phone note. Rectal bleeding, doing better, occasionally sees a stain, no frank bleeding. Anemia, good compliance with iron   Past Medical History  Diagnosis Date  . A-fib     paroxysmal, was on coumadin , pt desired to stop it , no longer a candidate per cards  . Tachy-brady syndrome   . CAD (coronary artery disease)     MI angioplasty 75, s/p CABG 01/2006- pacemaker  (12-2006), NSTMI  7-11  . CHF (congestive heart failure)   . Hyperlipidemia   . Hypertension   . Spinal stenosis      has seen Dr Ethelene Hal before, s/p local injection which helped x a while   . GERD (gastroesophageal reflux disease)   . RLS (restless legs syndrome)     ? of  . Vitamin B12 deficiency   . Insomnia   . History of BPH     TURP '96 at [facility] 96, urethoplast 97 ( History of bladder outlet obstruction, chronic incontinence)  . Myocardial infarction   . Pacemaker   . Shortness of breath   . Arthritis     back   Past Surgical History  Procedure Laterality Date  . Coronary artery bypass graft    . Pacemaker insertion      12-2006 PPM Medtronic  . Turp vaporization      x 2  . Inguinal hernia repair      Right  . Replacement total knee  2005, 2006    right 2005, Left 2006.  Dr  Despina Hick  . Carpal tunnel release  03/2004    bilateral.  Dr Teressa Senter  . Insert / replace / remove pacemaker    . Tee without cardioversion N/A 03/27/2013    Procedure: TRANSESOPHAGEAL ECHOCARDIOGRAM (TEE);  Surgeon: Lewayne Bunting, MD;  Location: [place] ENDOSCOPY;  Service: Cardiovascular;  Laterality: N/A;  . Cardioversion N/A 03/27/2013    Procedure: CARDIOVERSION;  Surgeon: Lewayne Bunting, MD;  Location: [facility] ENDOSCOPY;  Service:  Cardiovascular;  Laterality: N/A;     Review of Systems No fever or chills. After the fall developed no headache, neck stiffness, nausea or vomiting.Slightly sore in the area of impact.     Objective:   Physical Exam BP 120/72  Pulse 60  Temp(Src) 98.2 F (36.8 C) (Oral)  Wt 208 lb (94.348 kg)  BMI 30.7 kg/m2  SpO2 97%  General -- alert, well-developed, Using a walker.   Neck--full range of motion Lungs -- normal respiratory effort, no intercostal retractions, no accessory muscle use, and normal breath sounds.   Heart-- normal rate, regular rhythm Extremities-- no pretibial edema bilaterally  Neurologic-- alert & oriented X3 , gait is at baseline,strength normal in all extremities. Psych-- not anxious appearing and not depressed appearing.       Assessment & Plan:

## 2013-04-14 NOTE — Assessment & Plan Note (Addendum)
After a fall yesterday I discontinue xarelto given risk of severe bleeding-injury. He is now on aspirin. I discussed with the patient the reason behind this advise, also the increased risk of strokes.He understood and is in agreement

## 2013-04-15 ENCOUNTER — Ambulatory Visit: Payer: Medicare Other | Admitting: Physical Therapy

## 2013-04-15 ENCOUNTER — Encounter: Payer: Self-pay | Admitting: Internal Medicine

## 2013-04-17 ENCOUNTER — Telehealth: Payer: Self-pay | Admitting: *Deleted

## 2013-04-17 NOTE — Telephone Encounter (Signed)
Thank you. [name], please check on him Monday

## 2013-04-17 NOTE — Telephone Encounter (Signed)
Shaunda from [facility] called to let us know that the pt fell out of his recliner this morning. Clydie Braun states that EMS came to help the pt up but the pt was no injured.   [phone]

## 2013-04-20 ENCOUNTER — Encounter: Payer: Medicare Other | Admitting: Physician Assistant

## 2013-04-20 NOTE — Telephone Encounter (Signed)
Spoke to pt's wife, she states that the pt was doing ok. The pt got a new recliner that is not as easy to fall out of.

## 2013-04-21 ENCOUNTER — Ambulatory Visit (INDEPENDENT_AMBULATORY_CARE_PROVIDER_SITE_OTHER): Payer: Medicare Other | Admitting: Gastroenterology

## 2013-04-21 ENCOUNTER — Encounter: Payer: Self-pay | Admitting: Gastroenterology

## 2013-04-21 VITALS — BP 110/68 | HR 64 | Ht 69.0 in | Wt 207.6 lb

## 2013-04-21 DIAGNOSIS — K625 Hemorrhage of anus and rectum: Secondary | ICD-10-CM

## 2013-04-21 NOTE — Patient Instructions (Addendum)
Your CT Colonoscopy is scheduled on 05/04/2013 at 7:45am at [facility] at [address]  You will need to go by there office and pick up a prep before your test

## 2013-04-21 NOTE — Progress Notes (Signed)
History of Present Illness:  The patient has returned following hospitalization for congestive heart failure. He underwent cardioversion and was placed on anticoagulants which subsequently have been discontinued. While anticoagulated he had been having recurrent rectal bleeding consisting of small amounts of bright red blood . This was attributed to hemorrhoids. Since discontinuing anticoagulants bleeding has been minimal. He may see some very slight red staining in his underclothes. He has had no further frank rectal bleeding. He recently fell because of  lightheadedness which caused an abrasion. The patient has a remote history of colon polyps.    Past Medical History  Diagnosis Date  . A-fib     paroxysmal, was on coumadin , pt desired to stop it , no longer a candidate per cards  . Tachy-brady syndrome   . CAD (coronary artery disease)     MI angioplasty 78, s/p CABG 01/2006- pacemaker  (12-2006), NSTMI  7-11  . CHF (congestive heart failure)   . Hyperlipidemia   . Hypertension   . Spinal stenosis      has seen Dr Ethelene Hal before, s/p local injection which helped x a while   . GERD (gastroesophageal reflux disease)   . RLS (restless legs syndrome)     ? of  . Vitamin B12 deficiency   . Insomnia   . History of BPH     TURP '96 at [facility] 96, urethoplast 97 ( History of bladder outlet obstruction, chronic incontinence)  . Myocardial infarction   . Pacemaker   . Shortness of breath   . Arthritis     back   Past Surgical History  Procedure Laterality Date  . Coronary artery bypass graft    . Pacemaker insertion      12-2006 PPM Medtronic  . Turp vaporization      x 2  . Inguinal hernia repair      Right  . Replacement total knee  2005, 2006    right 2005, Left 2006.  Dr  Despina Hick  . Carpal tunnel release  03/2004    bilateral.  Dr Teressa Senter  . Insert / replace / remove pacemaker    . Tee without cardioversion N/A 03/27/2013    Procedure: TRANSESOPHAGEAL ECHOCARDIOGRAM (TEE);  Surgeon:  Lewayne Bunting, MD;  Location: [facility] ENDOSCOPY;  Service: Cardiovascular;  Laterality: N/A;  . Cardioversion N/A 03/27/2013    Procedure: CARDIOVERSION;  Surgeon: Lewayne Bunting, MD;  Location: [facility] ENDOSCOPY;  Service: Cardiovascular;  Laterality: N/A;   family history is negative for Heart disease and Diabetes. Current Outpatient Prescriptions  Medication Sig Dispense Refill  . acetaminophen (TYLENOL) 500 MG tablet Take 500 mg by mouth every 8 (eight) hours as needed for pain.      Marland Kitchen aspirin 81 MG tablet Take 81 mg by mouth daily.      Marland Kitchen b complex vitamins tablet Take 1 tablet by mouth daily.      . beta carotene w/minerals (OCUVITE) tablet Take 1 tablet by mouth daily.      . carvedilol (COREG) 12.5 MG tablet Take 1 tablet (12.5 mg total) by mouth 2 (two) times daily with a meal.  60 tablet  6  . ferrous sulfate 325 (65 FE) MG tablet Take 1 tablet (325 mg total) by mouth 3 (three) times daily with meals.  90 tablet  3  . furosemide (LASIX) 40 MG tablet Take 1 tablet (40 mg total) by mouth 2 (two) times daily.  60 tablet  11  . potassium chloride (K-DUR) 10 MEQ  tablet Take 10 mEq by mouth every evening.       No current facility-administered medications for this visit.   Allergies as of 04/21/2013 - Review Complete 04/21/2013  Allergen Reaction Noted  . Amlodipine Other (See Comments) 03/06/2013  . Atorvastatin Other (See Comments) 12/23/2008    reports that he has quit smoking. He has never used smokeless tobacco. He reports that he does not drink alcohol or use illicit drugs.     Review of Systems: Pertinent positive and negative review of systems were noted in the above HPI section. All other review of systems were otherwise negative.  Vital signs were reviewed in today's medical record Physical Exam: General: Older link well nourished, no acute distress

## 2013-04-21 NOTE — Assessment & Plan Note (Signed)
Limited rectal bleeding is most likely hemorrhoidal. Patient is asymptomatic since discontinuing anticoagulants.  Recommendations #1 expectant management for bleeding including suppositories; if this becomes a persistent problem I would consider band ligation #2 in view of history of colon polyps I will obtain a virtual CT exam to rule out a more proximal colonic bleeding source

## 2013-04-23 ENCOUNTER — Telehealth: Payer: Self-pay | Admitting: Internal Medicine

## 2013-04-23 NOTE — Telephone Encounter (Signed)
Patient's wife called stating the patient saw Dr. Arlyce [name] and he wants the patient to have a colonoscopy. Pts wife does not think that he is well enough to have this procedure. She would like Dr. Leta [name] opinion on this. CB# [phone]

## 2013-04-23 NOTE — Telephone Encounter (Signed)
Please advise 

## 2013-04-24 NOTE — Telephone Encounter (Signed)
Appointment cancelled

## 2013-04-24 NOTE — Telephone Encounter (Signed)
I discussed the situation with GI and then I called the patient's wife. She is concerned about [name] doing to the virtual colonoscopy prep, she's afraid he will get dehydrated and increase his fall risk, I agree w/ her concerns. I told her  that w/o the test, we can't  be sure there is no polyp or malignancy causing the bleeding and she clearly understands the risk but still think that procedure would be very hard on him. Plan: [name], please cancel the CT The patient will call if bleeding resurface.

## 2013-05-04 ENCOUNTER — Other Ambulatory Visit: Payer: Medicare Other

## 2013-05-08 ENCOUNTER — Encounter: Payer: Self-pay | Admitting: Cardiology

## 2013-05-08 ENCOUNTER — Ambulatory Visit (INDEPENDENT_AMBULATORY_CARE_PROVIDER_SITE_OTHER): Payer: Medicare Other | Admitting: Cardiology

## 2013-05-08 VITALS — BP 120/72 | HR 61 | Ht 69.0 in | Wt 209.9 lb

## 2013-05-08 DIAGNOSIS — I4892 Unspecified atrial flutter: Secondary | ICD-10-CM | POA: Insufficient documentation

## 2013-05-08 DIAGNOSIS — I4891 Unspecified atrial fibrillation: Secondary | ICD-10-CM

## 2013-05-08 DIAGNOSIS — D649 Anemia, unspecified: Secondary | ICD-10-CM

## 2013-05-08 DIAGNOSIS — I5033 Acute on chronic diastolic (congestive) heart failure: Secondary | ICD-10-CM

## 2013-05-08 DIAGNOSIS — R600 Localized edema: Secondary | ICD-10-CM | POA: Insufficient documentation

## 2013-05-08 DIAGNOSIS — K625 Hemorrhage of anus and rectum: Secondary | ICD-10-CM

## 2013-05-08 DIAGNOSIS — I2510 Atherosclerotic heart disease of native coronary artery without angina pectoris: Secondary | ICD-10-CM

## 2013-05-08 DIAGNOSIS — R609 Edema, unspecified: Secondary | ICD-10-CM

## 2013-05-08 LAB — CBC WITH DIFFERENTIAL/PLATELET
Basophils Absolute: 0 10*3/uL (ref 0.0–0.1)
Eosinophils Absolute: 0.2 10*3/uL (ref 0.0–0.7)
Hemoglobin: 11 g/dL — ABNORMAL LOW (ref 13.0–17.0)
Lymphocytes Relative: 10.4 % — ABNORMAL LOW (ref 12.0–46.0)
MCHC: 33.6 g/dL (ref 30.0–36.0)
Monocytes Relative: 7.8 % (ref 3.0–12.0)
Neutro Abs: 9.7 10*3/uL — ABNORMAL HIGH (ref 1.4–7.7)
Neutrophils Relative %: 79.8 % — ABNORMAL HIGH (ref 43.0–77.0)
Platelets: 258 10*3/uL (ref 150.0–400.0)
RDW: 15 % — ABNORMAL HIGH (ref 11.5–14.6)

## 2013-05-08 LAB — BASIC METABOLIC PANEL
CO2: 24 mEq/L (ref 19–32)
Chloride: 105 mEq/L (ref 96–112)
Creatinine, Ser: 1.1 mg/dL (ref 0.4–1.5)
Potassium: 4.2 mEq/L (ref 3.5–5.1)
Sodium: 140 mEq/L (ref 135–145)

## 2013-05-08 NOTE — Progress Notes (Signed)
HPI:  He returns in follow up.  Generally, he is doing well and his wife wants to know what he can do.  In addition, notably, he has fallen again.  Dr. Drue Novel took him off of his Xarelto due to recurrent hemorrhoidal bleeding.  Also, given the fall, it was felt better to get him off of anticoagulation.  No current chest pain.    Current Outpatient Prescriptions  Medication Sig Dispense Refill  . acetaminophen (TYLENOL) 500 MG tablet Take 500 mg by mouth every 8 (eight) hours as needed for pain.      Marland Kitchen aspirin 81 MG tablet Take 81 mg by mouth daily.      Marland Kitchen b complex vitamins tablet Take 1 tablet by mouth daily.      . beta carotene w/minerals (OCUVITE) tablet Take 1 tablet by mouth daily.      . carvedilol (COREG) 12.5 MG tablet Take 1 tablet (12.5 mg total) by mouth 2 (two) times daily with a meal.  60 tablet  6  . ferrous sulfate 325 (65 FE) MG tablet Take 1 tablet (325 mg total) by mouth 3 (three) times daily with meals.  90 tablet  3  . furosemide (LASIX) 40 MG tablet Take 1 tablet (40 mg total) by mouth 2 (two) times daily.  60 tablet  11  . potassium chloride (K-DUR) 10 MEQ tablet Take 10 mEq by mouth every evening.       No current facility-administered medications for this visit.    Allergies  Allergen Reactions  . Amlodipine Other (See Comments)    Feet swelled  . Atorvastatin Other (See Comments)     leg pain    Past Medical History  Diagnosis Date  . A-fib     paroxysmal, was on coumadin , pt desired to stop it , no longer a candidate per cards  . Tachy-brady syndrome   . CAD (coronary artery disease)     MI angioplasty 35, s/p CABG 01/2006- pacemaker  (12-2006), NSTMI  7-11  . CHF (congestive heart failure)   . Hyperlipidemia   . Hypertension   . Spinal stenosis      has seen Dr Ethelene Hal before, s/p local injection which helped x a while   . GERD (gastroesophageal reflux disease)   . RLS (restless legs syndrome)     ? of  . Vitamin B12 deficiency   . Insomnia   .  History of BPH     TURP '96 at [facility] 96, urethoplast 97 ( History of bladder outlet obstruction, chronic incontinence)  . Myocardial infarction   . Pacemaker   . Shortness of breath   . Arthritis     back    Past Surgical History  Procedure Laterality Date  . Coronary artery bypass graft    . Pacemaker insertion      12-2006 PPM Medtronic  . Turp vaporization      x 2  . Inguinal hernia repair      Right  . Replacement total knee  2005, 2006    right 2005, Left 2006.  Dr  Despina Hick  . Carpal tunnel release  03/2004    bilateral.  Dr Teressa Senter  . Insert / replace / remove pacemaker    . Tee without cardioversion N/A 03/27/2013    Procedure: TRANSESOPHAGEAL ECHOCARDIOGRAM (TEE);  Surgeon: Lewayne Bunting, MD;  Location: [facility] ENDOSCOPY;  Service: Cardiovascular;  Laterality: N/A;  . Cardioversion N/A 03/27/2013    Procedure: CARDIOVERSION;  Surgeon: Arlys John  Ludwig Clarks, MD;  Location: [facility] ENDOSCOPY;  Service: Cardiovascular;  Laterality: N/A;    Family History  Problem Relation Age of Onset  . Heart disease Neg Hx   . Diabetes Neg Hx     History   Social History  . Marital Status: Married    Spouse Name: N/A    Number of Children: N/A  . Years of Education: N/A   Occupational History  . Not on file.   Social History Main Topics  . Smoking status: Former Games developer  . Smokeless tobacco: Never Used  . Alcohol Use: No  . Drug Use: No  . Sexually Active: Not on file   Other Topics Concern  . Not on file   Social History Narrative  . No narrative on file    ROS: Please see the HPI.  All other systems reviewed and negative.  PHYSICAL EXAM:  There were no vitals taken for this visit.  General: Well developed, well nourished, in no acute distress. Head:  Normocephalic and atraumatic. Neck: no JVD Lungs: Clear to auscultation and percussion. Heart: Normal S1 and S2. 1-2/6 SEM.  No DM.   Abdomen:  Normal bowel sounds; soft; non tender; no organomegaly Pulses: Pulses normal in  all 4 extremities. Extremities: No clubbing or cyanosis. Trace lower extremity edema. Neurologic: Alert and oriented x 3.  EKG:  Atrioventricular pacing.    ASSESSMENT AND PLAN:  1.  Atrial flutter  -  Currently in NSR 2.  Rectal bleeding due to hemorrhoids.   3.  Lower extremity edema.    Plan   Continue meds. No Xarelto for now--discussed with patient and his wife.  TS Check CBC and BMET FU with Dr. Excell Seltzer in two months.

## 2013-05-08 NOTE — Assessment & Plan Note (Signed)
Improved, got worse with atrial flutter.

## 2013-05-08 NOTE — Assessment & Plan Note (Signed)
Currently stable on rate control.  Paced.  In future, might require other options   --  Ablation, AA therapy.  For now will continue on current approach.

## 2013-05-08 NOTE — Assessment & Plan Note (Signed)
Will recheck BMET since on diuretics and K.

## 2013-05-08 NOTE — Assessment & Plan Note (Signed)
No further bleeding.  Xarelto was stopped by Dr.Paz.  Now on ASA.  Will continue current meds.

## 2013-05-08 NOTE — Addendum Note (Signed)
Addended by: Iona Coach on: 05/08/2013 11:06 AM   Modules accepted: Orders

## 2013-05-08 NOTE — Patient Instructions (Addendum)
Your physician recommends that you schedule a follow-up appointment in: 2 MONTHS with Dr Excell Seltzer  Your physician recommends that you have lab work today: CBC and BMP  Your physician recommends that you continue on your current medications as directed. Please refer to the Current Medication list given to you today.

## 2013-05-12 ENCOUNTER — Telehealth: Payer: Self-pay | Admitting: Internal Medicine

## 2013-05-12 NOTE — Telephone Encounter (Signed)
I recommend to discuss with cardiology Lasix versus HCTZ. Is very common to get constipated with iron, he may like to try  a different formulation call Iron gluconate OTC take only one tablet daily.

## 2013-05-12 NOTE — Telephone Encounter (Signed)
Please advise 

## 2013-05-12 NOTE — Telephone Encounter (Signed)
Discussed with pt's wife. 

## 2013-05-12 NOTE — Telephone Encounter (Signed)
Patient Information:  Caller Name: Jacki Cones  Phone: [phone]  Patient: [name], [name]  Gender: Male  DOB: 05-Feb-1921  Age: 79 Years  PCP: Willow Ora  Office Follow Up:  Does the office need to follow up with this patient?: Yes  Instructions For The Office: Wife/Laurie can also be reached at home #- [phone] . Rrequesting medication changes. requesting Lasix be discontinued and HCTZ be ordered, Requesting substitute for Ferrous Sulfate.   Symptoms  Reason For Call & Symptoms: Laurei states Babak takes Lasix 40 mg. Was ordered Lasix 40 mg po BID on 03/27/13 by Dr. Engineer, water. States Dr. Riley Kill is retiring and Dr. Copper will be his cardiologist. States Yani is only taking Lasix 40 mg once a day "because he is urinating so frequently."States Obert is " urinating frequently and wetting his clothes."  Jacki Cones states "we are unable to go anywhere because he will have an accident."  Asking if Bharath can switch back to Hydrochlorothiazide? States Hensley was ordered Hydrochlorothiazide in ED on 03/06/13. Jacki Cones has not spoken with Cardiology regarding discontinuing Lasix and starting HCTZ.  Also, states Raylyn is supposed to take Ferrous Sulfate 325 mg  one tab TID. States Aayan having severe constipation when taking Ferrous Sulfate with painful hemorrhoids and stopped taking Ferrous Sulfate "last week".  Has tried taking stool softeners with Ferrous Sulfate but states BM" came on too suddenly."Per constipation protocol has see provider within 2 weeks disposition due to constipation is recurrent problem. Asking if there is any medication that can be substituted for Ferrous Sulfate? Wife /Laurie can be reached at home -[phone] or cell [phone].  Reviewed Health History In EMR: Yes  Reviewed Medications In EMR: Yes  Reviewed Allergies In EMR: Yes  Reviewed Surgeries / Procedures: Yes  Date of Onset of Symptoms: 03/27/2013  Guideline(s) Used:  Constipation  No Protocol  Available - Information Only  Disposition Per Guideline:   Discuss with PCP and Callback by Nurse Today  Reason For Disposition Reached:   Nursing judgment  Advice Given:  General Constipation Instructions:  Eat a high fiber diet.  Drink adequate liquids.  Call Back If:  You become worse  Call Back If:  You become worse.  Patient Will Follow Care Advice:  YES

## 2013-05-13 ENCOUNTER — Inpatient Hospital Stay (HOSPITAL_COMMUNITY): Payer: Medicare Other

## 2013-05-13 ENCOUNTER — Telehealth: Payer: Self-pay | Admitting: *Deleted

## 2013-05-13 ENCOUNTER — Encounter (HOSPITAL_COMMUNITY): Payer: Self-pay

## 2013-05-13 ENCOUNTER — Inpatient Hospital Stay (HOSPITAL_COMMUNITY)
Admission: EM | Admit: 2013-05-13 | Discharge: 2013-05-15 | DRG: 312 | Disposition: A | Discharge status: Home / Self Care | Payer: Medicare Other | Attending: Internal Medicine | Admitting: Internal Medicine

## 2013-05-13 DIAGNOSIS — I447 Left bundle-branch block, unspecified: Secondary | ICD-10-CM | POA: Diagnosis present

## 2013-05-13 DIAGNOSIS — I509 Heart failure, unspecified: Secondary | ICD-10-CM

## 2013-05-13 DIAGNOSIS — Z96659 Presence of unspecified artificial knee joint: Secondary | ICD-10-CM

## 2013-05-13 DIAGNOSIS — D649 Anemia, unspecified: Secondary | ICD-10-CM

## 2013-05-13 DIAGNOSIS — L03119 Cellulitis of unspecified part of limb: Secondary | ICD-10-CM | POA: Diagnosis present

## 2013-05-13 DIAGNOSIS — I2510 Atherosclerotic heart disease of native coronary artery without angina pectoris: Secondary | ICD-10-CM

## 2013-05-13 DIAGNOSIS — K219 Gastro-esophageal reflux disease without esophagitis: Secondary | ICD-10-CM

## 2013-05-13 DIAGNOSIS — W19XXXA Unspecified fall, initial encounter: Secondary | ICD-10-CM

## 2013-05-13 DIAGNOSIS — I5032 Chronic diastolic (congestive) heart failure: Secondary | ICD-10-CM | POA: Diagnosis present

## 2013-05-13 DIAGNOSIS — L02419 Cutaneous abscess of limb, unspecified: Secondary | ICD-10-CM | POA: Diagnosis present

## 2013-05-13 DIAGNOSIS — I472 Ventricular tachycardia, unspecified: Secondary | ICD-10-CM

## 2013-05-13 DIAGNOSIS — Z950 Presence of cardiac pacemaker: Secondary | ICD-10-CM

## 2013-05-13 DIAGNOSIS — K625 Hemorrhage of anus and rectum: Secondary | ICD-10-CM

## 2013-05-13 DIAGNOSIS — Z951 Presence of aortocoronary bypass graft: Secondary | ICD-10-CM

## 2013-05-13 DIAGNOSIS — I951 Orthostatic hypotension: Principal | ICD-10-CM

## 2013-05-13 DIAGNOSIS — I495 Sick sinus syndrome: Secondary | ICD-10-CM | POA: Diagnosis present

## 2013-05-13 DIAGNOSIS — I5033 Acute on chronic diastolic (congestive) heart failure: Secondary | ICD-10-CM

## 2013-05-13 DIAGNOSIS — Z66 Do not resuscitate: Secondary | ICD-10-CM | POA: Diagnosis present

## 2013-05-13 DIAGNOSIS — M791 Myalgia, unspecified site: Secondary | ICD-10-CM

## 2013-05-13 DIAGNOSIS — R0609 Other forms of dyspnea: Secondary | ICD-10-CM

## 2013-05-13 DIAGNOSIS — R269 Unspecified abnormalities of gait and mobility: Secondary | ICD-10-CM

## 2013-05-13 DIAGNOSIS — L03116 Cellulitis of left lower limb: Secondary | ICD-10-CM

## 2013-05-13 DIAGNOSIS — G2581 Restless legs syndrome: Secondary | ICD-10-CM

## 2013-05-13 DIAGNOSIS — M549 Dorsalgia, unspecified: Secondary | ICD-10-CM

## 2013-05-13 DIAGNOSIS — Z7982 Long term (current) use of aspirin: Secondary | ICD-10-CM

## 2013-05-13 DIAGNOSIS — I10 Essential (primary) hypertension: Secondary | ICD-10-CM

## 2013-05-13 DIAGNOSIS — D6489 Other specified anemias: Secondary | ICD-10-CM | POA: Diagnosis present

## 2013-05-13 DIAGNOSIS — R55 Syncope and collapse: Secondary | ICD-10-CM

## 2013-05-13 DIAGNOSIS — R296 Repeated falls: Secondary | ICD-10-CM

## 2013-05-13 DIAGNOSIS — I498 Other specified cardiac arrhythmias: Secondary | ICD-10-CM

## 2013-05-13 DIAGNOSIS — E538 Deficiency of other specified B group vitamins: Secondary | ICD-10-CM | POA: Diagnosis present

## 2013-05-13 DIAGNOSIS — I252 Old myocardial infarction: Secondary | ICD-10-CM

## 2013-05-13 DIAGNOSIS — Z79899 Other long term (current) drug therapy: Secondary | ICD-10-CM

## 2013-05-13 DIAGNOSIS — G4700 Insomnia, unspecified: Secondary | ICD-10-CM

## 2013-05-13 DIAGNOSIS — Z9181 History of falling: Secondary | ICD-10-CM

## 2013-05-13 DIAGNOSIS — Z9849 Cataract extraction status, unspecified eye: Secondary | ICD-10-CM

## 2013-05-13 DIAGNOSIS — Z87891 Personal history of nicotine dependence: Secondary | ICD-10-CM

## 2013-05-13 DIAGNOSIS — I25810 Atherosclerosis of coronary artery bypass graft(s) without angina pectoris: Secondary | ICD-10-CM

## 2013-05-13 DIAGNOSIS — I4891 Unspecified atrial fibrillation: Secondary | ICD-10-CM

## 2013-05-13 DIAGNOSIS — Z961 Presence of intraocular lens: Secondary | ICD-10-CM

## 2013-05-13 DIAGNOSIS — N179 Acute kidney failure, unspecified: Secondary | ICD-10-CM

## 2013-05-13 DIAGNOSIS — N400 Enlarged prostate without lower urinary tract symptoms: Secondary | ICD-10-CM

## 2013-05-13 DIAGNOSIS — I4729 Other ventricular tachycardia: Secondary | ICD-10-CM | POA: Diagnosis present

## 2013-05-13 DIAGNOSIS — I4892 Unspecified atrial flutter: Secondary | ICD-10-CM

## 2013-05-13 DIAGNOSIS — I959 Hypotension, unspecified: Secondary | ICD-10-CM

## 2013-05-13 DIAGNOSIS — E785 Hyperlipidemia, unspecified: Secondary | ICD-10-CM

## 2013-05-13 DIAGNOSIS — R7309 Other abnormal glucose: Secondary | ICD-10-CM

## 2013-05-13 DIAGNOSIS — R600 Localized edema: Secondary | ICD-10-CM

## 2013-05-13 DIAGNOSIS — S0003XA Contusion of scalp, initial encounter: Secondary | ICD-10-CM | POA: Diagnosis present

## 2013-05-13 DIAGNOSIS — Z9861 Coronary angioplasty status: Secondary | ICD-10-CM

## 2013-05-13 HISTORY — DX: Vitamin B12 deficiency anemia, unspecified: D51.9

## 2013-05-13 LAB — CBC
MCH: 29.4 pg (ref 26.0–34.0)
MCHC: 33.7 g/dL (ref 30.0–36.0)
Platelets: 238 10*3/uL (ref 150–400)
RBC: 3.78 MIL/uL — ABNORMAL LOW (ref 4.22–5.81)
RDW: 14.3 % (ref 11.5–15.5)

## 2013-05-13 LAB — BASIC METABOLIC PANEL
Calcium: 8.4 mg/dL (ref 8.4–10.5)
Chloride: 107 mEq/L (ref 96–112)
Creatinine, Ser: 1.04 mg/dL (ref 0.50–1.35)
GFR calc Af Amer: 71 mL/min — ABNORMAL LOW (ref >90)
GFR calc non Af Amer: 62 mL/min — ABNORMAL LOW (ref >90)

## 2013-05-13 LAB — CBC WITH DIFFERENTIAL/PLATELET
Basophils Absolute: 0 10*3/uL (ref 0.0–0.1)
Basophils Relative: 0 % (ref 0–1)
Eosinophils Absolute: 0.4 10*3/uL (ref 0.0–0.7)
HCT: 31.1 % — ABNORMAL LOW (ref 39.0–52.0)
MCHC: 32.8 g/dL (ref 30.0–36.0)
Monocytes Absolute: 0.7 10*3/uL (ref 0.1–1.0)
Neutro Abs: 6 10*3/uL (ref 1.7–7.7)
RDW: 14.3 % (ref 11.5–15.5)

## 2013-05-13 LAB — POCT I-STAT TROPONIN I: Troponin i, poc: 0.01 ng/mL (ref 0.00–0.08)

## 2013-05-13 LAB — CREATININE, SERUM: Creatinine, Ser: 0.95 mg/dL (ref 0.50–1.35)

## 2013-05-13 MED ORDER — ENOXAPARIN SODIUM 40 MG/0.4ML ~~LOC~~ SOLN
40.0000 mg | SUBCUTANEOUS | Status: DC
  Administered 2013-05-13 – 2013-05-14 (×2): 40 mg via SUBCUTANEOUS
  Filled 2013-05-13 (×4): qty 0.4

## 2013-05-13 MED ORDER — SODIUM CHLORIDE 0.9 % IJ SOLN
3.0000 mL | Freq: Two times a day (BID) | INTRAMUSCULAR | Status: DC
  Administered 2013-05-13 – 2013-05-15 (×4): 3 mL via INTRAVENOUS

## 2013-05-13 MED ORDER — POTASSIUM CHLORIDE ER 10 MEQ PO TBCR
10.0000 meq | EXTENDED_RELEASE_TABLET | Freq: Every evening | ORAL | Status: DC
  Administered 2013-05-14 – 2013-05-15 (×2): 10 meq via ORAL
  Filled 2013-05-13 (×2): qty 1

## 2013-05-13 MED ORDER — FUROSEMIDE 40 MG PO TABS
40.0000 mg | ORAL_TABLET | Freq: Two times a day (BID) | ORAL | Status: DC
  Administered 2013-05-13 – 2013-05-14 (×2): 40 mg via ORAL
  Filled 2013-05-13 (×3): qty 1

## 2013-05-13 MED ORDER — ASPIRIN 81 MG PO TABS: 81.0000 mg | ORAL_TABLET | Freq: Every day | ORAL | Status: DC

## 2013-05-13 MED ORDER — ASPIRIN EC 81 MG PO TBEC
81.0000 mg | DELAYED_RELEASE_TABLET | Freq: Every day | ORAL | Status: DC
Start: 2013-05-13 — End: 2013-05-15
  Administered 2013-05-13 – 2013-05-15 (×3): 81 mg via ORAL
  Filled 2013-05-13 (×4): qty 1

## 2013-05-13 MED ORDER — ACETAMINOPHEN 500 MG PO TABS
500.0000 mg | ORAL_TABLET | Freq: Three times a day (TID) | ORAL | Status: DC | PRN
  Administered 2013-05-13: 500 mg via ORAL
  Filled 2013-05-13: qty 1

## 2013-05-13 MED ORDER — B COMPLEX-C PO TABS
1.0000 | ORAL_TABLET | Freq: Every day | ORAL | Status: DC
  Administered 2013-05-14 – 2013-05-15 (×2): 1 via ORAL
  Filled 2013-05-13 (×2): qty 1

## 2013-05-13 MED ORDER — CARVEDILOL 12.5 MG PO TABS
12.5000 mg | ORAL_TABLET | Freq: Two times a day (BID) | ORAL | Status: DC
  Administered 2013-05-14 – 2013-05-15 (×4): 12.5 mg via ORAL
  Filled 2013-05-13 (×5): qty 1

## 2013-05-13 MED ORDER — B COMPLEX PO TABS: 1.0000 | ORAL_TABLET | Freq: Every day | ORAL | Status: DC

## 2013-05-13 NOTE — ED Notes (Signed)
Pt passed swallow screen, can eat per EDP. Low salt diet ordered for patient. Pt uncomfortable in bed, repositioned in recliner chair.

## 2013-05-13 NOTE — Telephone Encounter (Signed)
Spoke to [facility] & she stated after she left the pt this morning his wife called & stated that the pt fell again. Amber had them go on to [facility] ER. He is there now.

## 2013-05-13 NOTE — Progress Notes (Signed)
79 yo man had syncopal episode.  He had gotten up to go to the bathroom, lost consciousness and fell, lacerating his scaple and right hand.  He has had several prior falls, not associated with syncope.  He has Atrial fibrillation and has been taken off of Xarelto because of frequent falls.  Exam shows skin tears on the scalp and right hand.  He is awake and alert, seems neurologically intact.  EKG shows a sinus rhythm and axis change.  Lab workup ortherwise unremarkable.  Recommended admission for observation post syncope.

## 2013-05-13 NOTE — ED Notes (Signed)
Lequita Halt, Consulting civil engineer to interrogate pacemaker.

## 2013-05-13 NOTE — ED Provider Notes (Signed)
1:21 PM  Date: 05/13/2013  Rate:66  Rhythm: normal sinus rhythm  QRS Axis: normal  Intervals: normal  ST/T Wave abnormalities: normal  Conduction Disutrbances:left bundle branch block  Narrative Interpretation: Abnormal EKG  Old EKG Reviewed: changes noted--previous EKG on 03/24/2013 showed RBBB and atrial fibrillation.    Carleene Cooper III, MD 05/13/13 1324

## 2013-05-13 NOTE — Telephone Encounter (Signed)
thx

## 2013-05-13 NOTE — ED Notes (Addendum)
Pt sitting comfortably, smiling, in chair. No acute distress noted.

## 2013-05-13 NOTE — H&P (Signed)
[facility] History and Physical  [name] WUJ:[id number] DOB: 01/19/1921 DOA: 05/13/2013  Referring physician: Emergency Department PCP: Willow Ora, MD  Specialists:   Chief Complaint: "I passed out."  HPI: [name] is a 79 y.o. male with a hx of CAD s/p CABG, afib, prior vtach s/p pacemaker, htn, and hx of CHF with EF of 50-55% one month ago. Pt presents with recurrent syncopal episodes described as collapsing "all of a sudden" and "without warning" while attempting to ambulate for a few feet from a sitting position. Pt denies dizziness, vertigo, chest pain or SOB prior to onset of sx. The patient had a similar episode two days prior to admission which also resulted in a head injury. Pt did not go to the hospital at that time.  Review of Systems:  Syncope, all other review of systems reviewed and are negative.  Past Medical History  Diagnosis Date  . A-fib     paroxysmal, was on coumadin , pt desired to stop it , no longer a candidate per cards  . Tachy-brady syndrome   . CAD (coronary artery disease)     MI angioplasty 22, s/p CABG 01/2006- pacemaker  (12-2006), NSTMI  7-11  . CHF (congestive heart failure)   . Hyperlipidemia   . Hypertension   . Spinal stenosis      has seen Dr Ethelene Hal before, s/p local injection which helped x a while   . GERD (gastroesophageal reflux disease)   . RLS (restless legs syndrome)     ? of  . Vitamin B12 deficiency   . Insomnia   . History of BPH     TURP '96 at [facility] 96, urethoplast 97 ( History of bladder outlet obstruction, chronic incontinence)  . Myocardial infarction   . Pacemaker   . Shortness of breath   . Arthritis     back   Past Surgical History  Procedure Laterality Date  . Coronary artery bypass graft    . Pacemaker insertion      12-2006 PPM Medtronic  . Turp vaporization      x 2  . Inguinal hernia repair      Right  . Replacement total knee  2005, 2006    right 2005, Left 2006.  Dr  Despina Hick  . Carpal  tunnel release  03/2004    bilateral.  Dr Teressa Senter  . Insert / replace / remove pacemaker    . Tee without cardioversion N/A 03/27/2013    Procedure: TRANSESOPHAGEAL ECHOCARDIOGRAM (TEE);  Surgeon: Lewayne Bunting, MD;  Location: [facility] ENDOSCOPY;  Service: Cardiovascular;  Laterality: N/A;  . Cardioversion N/A 03/27/2013    Procedure: CARDIOVERSION;  Surgeon: Lewayne Bunting, MD;  Location: [facility] ENDOSCOPY;  Service: Cardiovascular;  Laterality: N/A;   Social History:  reports that he has quit smoking. He has never used smokeless tobacco. He reports that he does not drink alcohol or use illicit drugs.   Allergies  Allergen Reactions  . Amlodipine Other (See Comments)    Feet swelled  . Atorvastatin Other (See Comments)     leg pain    Family History  Problem Relation Age of Onset  . Heart disease Neg Hx   . Diabetes Neg Hx     Prior to Admission medications   Medication Sig Start Date End Date Taking? Authorizing Provider  acetaminophen (TYLENOL) 500 MG tablet Take 500 mg by mouth every 8 (eight) hours as needed for pain.   Yes Historical Provider, MD  aspirin  81 MG tablet Take 81 mg by mouth daily.   Yes Historical Provider, MD  b complex vitamins tablet Take 1 tablet by mouth daily.   Yes Historical Provider, MD  beta carotene w/minerals (OCUVITE) tablet Take 1 tablet by mouth daily.   Yes Historical Provider, MD  carvedilol (COREG) 12.5 MG tablet Take 12.5 mg by mouth 2 (two) times daily with a meal.   Yes Historical Provider, MD  furosemide (LASIX) 40 MG tablet Take 40 mg by mouth 2 (two) times daily.   Yes Historical Provider, MD  potassium chloride (K-DUR) 10 MEQ tablet Take 10 mEq by mouth every evening.   Yes Historical Provider, MD   Physical Exam: Filed Vitals:   05/13/13 1415 05/13/13 1430 05/13/13 1600 05/13/13 1617  BP: 140/59 146/102 147/94   Pulse: 69 78 79   Temp:    97.4 F (36.3 C)  TempSrc:      Resp: 23 19 25    SpO2: 99% 97% 99%      General:  Awake, in  NAD  Eyes: PERRL  ENT: Mucus membranes moist, no exudate  Neck: Trachea midline, neck supple  Cardiovascular: Regular, s1, s2  Respiratory: Normal respiratory effort, no crackles  Abdomen: Soft, nondistended  Skin: Multiple areas of ecchymosis present, no other abnormal skin lesions seen  Musculoskeletal: Perfused distally, s/p B knee replacement, B LE pitting edema L>R  Psychiatric: Appears normal  Neurologic: CN 2-12 grossly intact, strength and sensation intact throughout  Labs on Admission:  Basic Metabolic Panel:  Recent Labs Lab 05/08/13 1052 05/13/13 1409  NA 140 138  K 4.2 4.3  CL 105 107  CO2 24 22  GLUCOSE 99 101*  BUN 18 18  CREATININE 1.1 1.04  CALCIUM 9.0 8.4   Liver Function Tests: No results found for this basename: AST, ALT, ALKPHOS, BILITOT, PROT, ALBUMIN,  in the last 168 hours No results found for this basename: LIPASE, AMYLASE,  in the last 168 hours No results found for this basename: AMMONIA,  in the last 168 hours CBC:  Recent Labs Lab 05/08/13 1052 05/13/13 1409  WBC 12.2* 8.5  NEUTROABS 9.7* 6.0  HGB 11.0* 10.2*  HCT 32.7* 31.1*  MCV 89.3 87.9  PLT 258.0 222   Cardiac Enzymes: No results found for this basename: CKTOTAL, CKMB, CKMBINDEX, TROPONINI,  in the last 168 hours  BNP (last 3 results)  Recent Labs  03/06/13 1850 03/23/13 1720  PROBNP 1704.0* 2160.0*   CBG: No results found for this basename: GLUCAP,  in the last 168 hours  Radiological Exams on Admission: No results found.  EKG: Independently reviewed. Paced rhythm  Assessment/Plan Principal Problem:   SYNCOPE Active Problems:   HYPERTENSION   CAD, ARTERY BYPASS GRAFT   VENTRICULAR TACHYCARDIA   Atrial fibrillation   CONGESTIVE HEART FAILURE   Syncope: - Pt with presenting sx suggestive of neurocardiogenic syncope - Pacemaker interrogated in the ED w/o acute events recorded - Will admit and monitor on tele - Given hx of CAD, will cycle cardiac  enzymes - Await head CT w/o contrast to r/o acute intracranial process CAD s/p CABG - Stable - Presently asymptomatic - Await serial cardiac enzymes - Pt was previously followed by Dr. Riley Kill Chronic compensated chf: - Last documented EF of 50-55% in 4/14 - Continue diuretics as tolerated Atrial Fibrillation: - Rate controlled - Off anticoagulation secondary to hx of recurrent falls DVT prophylaxis: - Lovenox for prophylaxis for now.    Code Status: DNR (must indicate code  status--if unknown or must be presumed, indicate so) Family Communication: Pt and wife in room (indicate person spoken with, if applicable, with phone number if by telephone) Disposition Plan: Pending (indicate anticipated LOS)  Time spent:  CHIU, Scheryl Marten [facility] Pager [phone]  If 7PM-7AM, please contact night-coverage [email or web address] Password [facility] 05/13/2013, 5:35 PM

## 2013-05-13 NOTE — Telephone Encounter (Signed)
Okay to continue PT, continue encouraging to take Lasix, may like to use a diaper if he has incontinence Next visit with me is at the end of June, if he needs to be seen sooner don't hesitate to arrange it.

## 2013-05-13 NOTE — ED Provider Notes (Signed)
History     CSN: [id number] Arrival date & time 05/13/13  1250 First MD Initiated Contact with Patient 05/13/13 1302      Chief Complaint  Patient presents with  . Loss of Consciousness   HPI  79 year old with complex medical history including CAD with history CABG, history tachy-brady s/p pacemaker, CHF, recurrent falls presenting after a syncopal episode at home.   Patient was getting up to walk to the bathroom today. States he was having "one of his good days" and denied chest pain, shortness of breath, palpitations, worsening edema, recent weight gain.  He had stood up and taken 4-5 steps then got his walker and preceded to go to restroom. When he got into hall way he states that he passed out without warning and fell to the side and hit his head. Wife could not get him off the ground and EMS was called. He did wake up and had no complaints such as focal weakness, blurry vision, headache after his fall. He had a large abrasion on his scalp which he states he had fallen a few days ago and scraped up (that time from losing balance). He has a history of recurrent falls from multiple different etiologies and he has been taken off his xarelto for paroxysmal a fib as a result. He remains on aspirin.   Past Medical History  Diagnosis Date  . A-fib     paroxysmal, was on coumadin , pt desired to stop it , no longer a candidate per cards  . Tachy-brady syndrome   . CAD (coronary artery disease)     MI angioplasty 59, s/p CABG 01/2006- pacemaker  (12-2006), NSTMI  7-11  . CHF (congestive heart failure)   . Hyperlipidemia   . Hypertension   . Spinal stenosis      has seen Dr Ethelene Hal before, s/p local injection which helped x a while   . GERD (gastroesophageal reflux disease)   . RLS (restless legs syndrome)     ? of  . Vitamin B12 deficiency   . Insomnia   . History of BPH     TURP '96 at [facility] 96, urethoplast 97 ( History of bladder outlet obstruction, chronic incontinence)  . Myocardial  infarction   . Pacemaker   . Shortness of breath   . Arthritis     back    Past Surgical History  Procedure Laterality Date  . Coronary artery bypass graft    . Pacemaker insertion      12-2006 PPM [employer]  . Turp vaporization      x 2  . Inguinal hernia repair      Right  . Replacement total knee  2005, 2006    right 2005, Left 2006.  Dr  Despina Hick  . Carpal tunnel release  03/2004    bilateral.  Dr Teressa Senter  . Insert / replace / remove pacemaker    . Tee without cardioversion N/A 03/27/2013    Procedure: TRANSESOPHAGEAL ECHOCARDIOGRAM (TEE);  Surgeon: Lewayne Bunting, MD;  Location: [facility] ENDOSCOPY;  Service: Cardiovascular;  Laterality: N/A;  . Cardioversion N/A 03/27/2013    Procedure: CARDIOVERSION;  Surgeon: Lewayne Bunting, MD;  Location: [facility] ENDOSCOPY;  Service: Cardiovascular;  Laterality: N/A;    Family History  Problem Relation Age of Onset  . Heart disease Neg Hx   . Diabetes Neg Hx     History  Substance Use Topics  . Smoking status: Former Games developer  . Smokeless tobacco: Never Used  .  Alcohol Use: No     Review of Systems A full 10 point review of symptoms was performed and was negative except as noted in HPI.   Allergies  Amlodipine and Atorvastatin  Home Medications   Current Outpatient Rx  Name  Route  Sig  Dispense  Refill  . acetaminophen (TYLENOL) 500 MG tablet   Oral   Take 500 mg by mouth every 8 (eight) hours as needed for pain.         Marland Kitchen aspirin 81 MG tablet   Oral   Take 81 mg by mouth daily.         Marland Kitchen b complex vitamins tablet   Oral   Take 1 tablet by mouth daily.         . beta carotene w/minerals (OCUVITE) tablet   Oral   Take 1 tablet by mouth daily.         . carvedilol (COREG) 12.5 MG tablet   Oral   Take 12.5 mg by mouth 2 (two) times daily with a meal.         . furosemide (LASIX) 40 MG tablet   Oral   Take 40 mg by mouth 2 (two) times daily.         . potassium chloride (K-DUR) 10 MEQ tablet   Oral   Take  10 mEq by mouth every evening.           BP 146/102  Pulse 78  Temp(Src) 97.8 F (36.6 C) (Oral)  Resp 19  SpO2 97%  Physical Exam  Constitutional: He is oriented to person, place, and time. He appears well-developed. No distress.  Obese elderly gentleman with abrasions with slight bleeding through dressings on top of head and on right hand at knuckle between digits 2+3.   HENT:  Mouth/Throat: Oropharynx is clear and moist.  Traumatic scalp as noted above without any depression in skull.   Eyes: EOM are normal. Pupils are equal, round, and reactive to light.  Neck: Normal range of motion. Neck supple.  Cardiovascular: Normal rate and regular rhythm.  Exam reveals no gallop and no friction rub.   Murmur (2/6 SEM at RUSB and LUSB) heard. Pulmonary/Chest: Effort normal and breath sounds normal. He has no wheezes. He has no rales.  Abdominal: Soft. Bowel sounds are normal. There is no tenderness. There is no rebound.  Musculoskeletal: Normal range of motion. He exhibits edema (1+ bilateral).  Venous stasis changes bilaterally with some mild erythema throughout left leg (previously treated for cellulitis without any obvious signs of infection other than color)   Neurological: He is alert and oriented to person, place, and time. No cranial nerve deficit. He exhibits normal muscle tone. Coordination normal.  5/5 strength in upper and lower extremities. Intact distal sensation.   Skin: Skin is warm and dry.  Psychiatric: He has a normal mood and affect. His behavior is normal.     ED Course  Procedures (including critical care time)  Labs Reviewed  CBC WITH DIFFERENTIAL - Abnormal; Notable for the following:    RBC 3.54 (*)    Hemoglobin 10.2 (*)    HCT 31.1 (*)    All other components within normal limits  BASIC METABOLIC PANEL - Abnormal; Notable for the following:    Glucose, Bld 101 (*)    GFR calc non Af Amer 62 (*)    GFR calc Af Amer 71 (*)    All other components within  normal limits  POCT I-STAT TROPONIN  I   No results found.  1. Syncope   2. Fall, initial encounter     MDM  79 year old with complex medical history including CAD with history CABG, history tachy-brady s/p pacemaker, CHF, recurrent falls presenting after a syncopal episode at home and subsequent fall.   Had patient's pacemaker interogated and per discussion with [place] of [employer], patient without any recorded events except for a few mode changes which would signify increased atrial activity for <30 seconds without corresponding high ventricular rate episodes. EKG shows new LBBB and patient will need to have troponins cycled but he is without chest pain or shortness of breath. POC troponin negative.   Nonfocal neurological exam and initial decision was to not CT head. Patient was bandaged up and had right hand dermabonded for 3cm laceration.   Decision made to admit patient to [facility]. Upon further discussion with Dr. Deno Etienne, patient will have CT head to rule out bleed and then be admitted to telemetry.    Shelva Majestic, MD 05/13/13 [phone]

## 2013-05-13 NOTE — ED Notes (Signed)
PER EMS: pt was at home today and fell onto carpet. +LOC. Pt also fell this past Friday and there is skin tear located on head from that fall but the fall today seemed to re-open the laceration. Bandaged by EMS. Also there is a skin tear noted on right hand that is new, bandaged by EMS. Pt has internal pacemaker. Pedal edema noted bilaterally. Umbilical hernia also noted upon arrival to this room. Pt A&O x 4. No CP, SOB, denies pain.

## 2013-05-13 NOTE — Telephone Encounter (Signed)
Amber w/ [facility] called to state that the pt fell over the weekend & yesterday in the home. Pt was seen by EMS in the home. Pt dose have an abrasion on forehead but has been bandaged up. Pt currently weighs 205lbs lowest has been 200lbs. Pt's left leg is swelling more than right leg. Pt has not taking his 2nd dose of lasix because of incontinence. Amber stated that she explained to the pt how important it is to take that second dose of lasix. She also stated that the pt has finished with physical therapy but would like another order to continue PT. Please advise.

## 2013-05-14 ENCOUNTER — Encounter (HOSPITAL_COMMUNITY): Payer: Self-pay | Admitting: General Practice

## 2013-05-14 DIAGNOSIS — I509 Heart failure, unspecified: Secondary | ICD-10-CM

## 2013-05-14 DIAGNOSIS — R296 Repeated falls: Secondary | ICD-10-CM

## 2013-05-14 DIAGNOSIS — R609 Edema, unspecified: Secondary | ICD-10-CM

## 2013-05-14 DIAGNOSIS — R55 Syncope and collapse: Secondary | ICD-10-CM

## 2013-05-14 DIAGNOSIS — Z9181 History of falling: Secondary | ICD-10-CM

## 2013-05-14 DIAGNOSIS — I4891 Unspecified atrial fibrillation: Secondary | ICD-10-CM

## 2013-05-14 LAB — TROPONIN I: Troponin I: <0.3 ng/mL (ref <0.30)

## 2013-05-14 MED ORDER — ZOLPIDEM TARTRATE 5 MG PO TABS
5.0000 mg | ORAL_TABLET | Freq: Once | ORAL | Status: AC
  Administered 2013-05-14: 5 mg via ORAL
  Filled 2013-05-14: qty 1

## 2013-05-14 MED ORDER — FUROSEMIDE 40 MG PO TABS
40.0000 mg | ORAL_TABLET | Freq: Every day | ORAL | Status: DC
  Administered 2013-05-15: 40 mg via ORAL
  Filled 2013-05-14: qty 1

## 2013-05-14 NOTE — Progress Notes (Signed)
Cardiology consult today. Pacemaker rate increased per tech to rate of 75. Ambulated several times in room tolerated well with walker.

## 2013-05-14 NOTE — Consult Note (Signed)
CONSULT NOTE  Date: 05/14/2013               Patient Name:  [name] MRN: [id number]  DOB: 11/29/1921 Age / Sex: 79 y.o., male        PCP: Willow Ora Primary Cardiologist: Arelia Sneddon            Referring Physician: Rickey Barbara              Reason for Consult: syncope           History of Present Illness: Patient is a 79 y.o. male with a PMHx of CAD, CABG, chronic leg edema, tachy-brady syndrome , s/p pacer, HTN, who was admitted to [facility] on 05/13/2013 for evaluation of syncope.   [name] has a history of orthostatic hypotension. He has fallen in the past. These episodes of syncope occur typically right after he's got up from a seated position. He has never had any syncope while sitting down.  He doesn't have much warning. These episodes are not associated with any chest pain or shortness of breath.   Medications: Outpatient medications: Prescriptions prior to admission  Medication Sig Dispense Refill  . acetaminophen (TYLENOL) 500 MG tablet Take 500 mg by mouth every 8 (eight) hours as needed for pain.      Marland Kitchen aspirin 81 MG tablet Take 81 mg by mouth daily.      Marland Kitchen b complex vitamins tablet Take 1 tablet by mouth daily.      . beta carotene w/minerals (OCUVITE) tablet Take 1 tablet by mouth daily.      . carvedilol (COREG) 12.5 MG tablet Take 12.5 mg by mouth 2 (two) times daily with a meal.      . furosemide (LASIX) 40 MG tablet Take 40 mg by mouth 2 (two) times daily.      . potassium chloride (K-DUR) 10 MEQ tablet Take 10 mEq by mouth every evening.        Current medications: Current Facility-Administered Medications  Medication Dose Route Frequency Provider Last Rate Last Dose  . acetaminophen (TYLENOL) tablet 500 mg  500 mg Oral Q8H PRN Jerald Kief, MD   500 mg at 05/13/13 2320  . aspirin EC tablet 81 mg  81 mg Oral Daily Jerald Kief, MD   81 mg at 05/14/13 1021  . B-complex with vitamin C tablet 1 tablet  1 tablet Oral Daily Carleene Cooper III, MD    1 tablet at 05/14/13 1021  . carvedilol (COREG) tablet 12.5 mg  12.5 mg Oral BID WC Jerald Kief, MD   12.5 mg at 05/14/13 0729  . enoxaparin (LOVENOX) injection 40 mg  40 mg Subcutaneous Q24H Jerald Kief, MD   40 mg at 05/13/13 2149  . furosemide (LASIX) tablet 40 mg  40 mg Oral BID Jerald Kief, MD   40 mg at 05/14/13 1021  . potassium chloride (K-DUR) CR tablet 10 mEq  10 mEq Oral QPM Jerald Kief, MD      . sodium chloride 0.9 % injection 3 mL  3 mL Intravenous Q12H Jerald Kief, MD   3 mL at 05/14/13 1021     Allergies  Allergen Reactions  . Amlodipine Other (See Comments)    Feet swelled  . Atorvastatin Other (See Comments)     leg pain     Past Medical History  Diagnosis Date  . A-fib     paroxysmal, was on coumadin , pt desired  to stop it , no longer a candidate per cards  . Tachy-brady syndrome   . CAD (coronary artery disease)     MI angioplasty 32, s/p CABG 01/2006- pacemaker  (12-2006), NSTMI  7-11  . CHF (congestive heart failure)   . Hyperlipidemia   . Hypertension   . Spinal stenosis      has seen Dr Ethelene Hal before, s/p local injection which helped x a while   . GERD (gastroesophageal reflux disease)   . RLS (restless legs syndrome)     ? of  . Insomnia   . History of BPH     TURP '96 at [facility] 96, urethoplast 97 ( History of bladder outlet obstruction, chronic incontinence)  . Pacemaker   . B12 deficiency anemia   . Heart murmur   . Anginal pain     "not in a long time" (05/14/2013)  . Myocardial infarction 01/1992; 06/2010  . Exertional shortness of breath   . Arthritis     back    Past Surgical History  Procedure Laterality Date  . Coronary artery bypass graft      "CABG X4" (05/14/2013)  . Cystoscopy with urethral dilatation  1997    "fixed strictures from TURP" (05/14/2013)  . Inguinal hernia repair Right   . Carpal tunnel release Bilateral ~ 2005    Dr Teressa Senter  . Insert / replace / remove pacemaker  12/2006    PPM [employer]  . Tee without  cardioversion N/A 03/27/2013    Procedure: TRANSESOPHAGEAL ECHOCARDIOGRAM (TEE);  Surgeon: Lewayne Bunting, MD;  Location: [facility] ENDOSCOPY;  Service: Cardiovascular;  Laterality: N/A;  . Cardioversion N/A 03/27/2013    Procedure: CARDIOVERSION;  Surgeon: Lewayne Bunting, MD;  Location: [facility] ENDOSCOPY;  Service: Cardiovascular;  Laterality: N/A;  . Total knee arthroplasty Bilateral     right 2005, Left 2006.  Dr  Despina Hick  . Coronary angioplasty      "Dr. Riley Kill"  . Cataract extraction w/ intraocular lens  implant, bilateral Bilateral 2012  . Transurethral resection of prostate  1996    Family History  Problem Relation Age of Onset  . Heart disease Neg Hx   . Diabetes Neg Hx     Social History:  reports that he quit smoking about 54 years ago. His smoking use included Cigarettes. He has a 30 pack-year smoking history. He has never used smokeless tobacco. He reports that he does not drink alcohol or use illicit drugs.   Review of Systems: Constitutional:  denies fever, chills, diaphoresis, appetite change and fatigue.  HEENT: denies photophobia, eye pain, redness, hearing loss, ear pain, congestion, sore throat, rhinorrhea, sneezing, neck pain, neck stiffness and tinnitus.  Respiratory: denies SOB, DOE, cough, chest tightness, and wheezing.  Cardiovascular: denies chest pain, palpitations and leg swelling.  Gastrointestinal: denies nausea, vomiting, abdominal pain, diarrhea, constipation, blood in stool.  Genitourinary: denies dysuria, urgency, frequency, hematuria, flank pain and difficulty urinating.  Musculoskeletal: denies  myalgias, back pain, joint swelling, arthralgias and gait problem.   Skin: denies pallor, rash and wound.  Neurological: admits to   syncope,  light-headedness / orthostatic hypotension  Hematological: denies adenopathy, easy bruising, personal or family bleeding history.  Psychiatric/ Behavioral: denies suicidal ideation, mood changes, confusion, nervousness, sleep  disturbance and agitation.    Physical Exam: BP 115/55  Pulse 62  Temp(Src) 97.8 F (36.6 C) (Oral)  Resp 19  Ht 5\' 9"  (1.753 m)  Wt 201 lb (91.173 kg)  BMI 29.67 kg/m2  SpO2 96%  General: Vital signs reviewed and noted. Well-developed, well-nourished, in no acute distress; alert, appropriate and cooperative throughout examination.  Head: Normocephalic,  sclera anicteric, mucus membranes are moist, bruises below both eyes due to falling  Neck: Supple. Negative for carotid bruits. JVD not elevated.  Lungs:  Clear bilaterally to auscultation without wheezes, rales, or rhonchi. Breathing is unlabored.  Heart: RRR with S1 S2.   Abdomen:  Soft, non-tender, non-distended with normoactive bowel sounds. No hepatomegaly. No rebound/guarding. No obvious abdominal masses  MSK: Strength and the appear normal for age.  Extremities: No clubbing or cyanosis. No edema.  Distal pedal pulses are 2+ and equal bilaterally.  Neurologic: Alert and oriented X 3. Moves all extremities spontaneously.  Psych: Responds to questions appropriately with a normal affect.    Lab results: Basic Metabolic Panel:  Recent Labs Lab 05/08/13 1052 05/13/13 1409 05/13/13 1835  NA 140 138  --   K 4.2 4.3  --   CL 105 107  --   CO2 24 22  --   GLUCOSE 99 101*  --   BUN 18 18  --   CREATININE 1.1 1.04 0.95  CALCIUM 9.0 8.4  --     Liver Function Tests: No results found for this basename: AST, ALT, ALKPHOS, BILITOT, PROT, ALBUMIN,  in the last 168 hours No results found for this basename: LIPASE, AMYLASE,  in the last 168 hours No results found for this basename: AMMONIA,  in the last 168 hours  CBC:  Recent Labs Lab 05/08/13 1052 05/13/13 1409 05/13/13 1835  WBC 12.2* 8.5 8.8  NEUTROABS 9.7* 6.0  --   HGB 11.0* 10.2* 11.1*  HCT 32.7* 31.1* 32.9*  MCV 89.3 87.9 87.0  PLT 258.0 222 238    Cardiac Enzymes:  Recent Labs Lab 05/13/13 1835 05/13/13 2307 05/14/13 0521  TROPONINI <0.30 <0.30 <0.30     BNP: No components found with this basename: POCBNP,   CBG: No results found for this basename: GLUCAP,  in the last 168 hours  Coagulation Studies: No results found for this basename: LABPROT, INR,  in the last 72 hours   Other results:  Tele:  AV pacing   Echo:  Study Conclusions  - Left ventricle: The cavity size was normal. Wall thickness was increased in a pattern of moderate LVH. Systolic function was normal. The estimated ejection fraction was in the range of 50% to 55%. Wall motion was normal; there were no regional wall motion abnormalities. Left ventricular diastolic function parameters were normal. - Mitral valve: Mild regurgitation. - Left atrium: The atrium was moderately dilated. - Right ventricle: The cavity size was mildly dilated. - Right atrium: The atrium was moderately dilated. - Tricuspid valve: Moderate regurgitation       Assessment & Plan:  1. syncope: I suspect that he is having symptoms of orthostatic hypotension. All of his falls occurred right after he stood up. He is pacemaker is set with a lower rate limit of 60. I suspect that he would do better with a higher rate-wrap 75.  He is on Lasix for chronic leg edema.  He has normal left ventricular systolic function. His most recent echocardiogram in April reveals normal diastolic function as well. He does have moderate left ventricular hypertrophy. He has mild mitral regurgitation and moderate tricuspid regurgitation.. I think that he would do better on once daily Lasix. It's quite likely that he is somewhat volume depleted. He does have significant leg edema but I think he is overall  intravascularly volume depleted.  We will reprogram his pacemaker and set his lower rate limit to 75. We will enable the "sleep" mode where his heart rate will go back down to 60 at night but will return to the lower rate of 75 during the day.  We will decrease his furosemide dose to 40 mg once a day. I've  advised him to elevate his legs regularly through the day.  We could recommend the Lounge Dr. Leg rest.   I have recommended that you elevate your legs.  An ideal way is to get a Lounge Doctor Leg rest - invented by one of our local vascular surgeons.  Website:  [email or web address]        I suspect that he could go home safely today or perhaps tomorrow.    Vesta Mixer, Montez Hageman., MD, [facility] 05/14/2013, 11:14 AM

## 2013-05-14 NOTE — Plan of Care (Signed)
Problem: Phase I Progression Outcomes Goal: OOB as tolerated unless otherwise ordered Outcome: Progressing Pt OOB with asst x1, pt weak on feet Goal: Voiding-avoid urinary catheter unless indicated Outcome: Completed/Met Date Met:  05/14/13 Pt voiding with use of condom catheter

## 2013-05-14 NOTE — Evaluation (Signed)
Physical Therapy Evaluation Patient Details Name: [name] MRN: [id number] DOB: 01/14/1921 Today's Date: 05/14/2013 Time: 4098-1191 PT Time Calculation (min): 21 min  PT Assessment / Plan / Recommendation Clinical Impression  Pt is a 79 yo male with a significant fall history reporting he "blacks out" and falls. Pt requires minG for transfers and minA for ambulation. Pt would benefit from acute PT to improve mobility status. Pt would require 24/7 supervision for OOB mobility to reduce the risk of future injury from falls. Pt would benefit from HHPT to improve mobility safety.     PT Assessment  Patient needs continued PT services    Follow Up Recommendations  Supervision/Assistance - 24 hour;Supervision for mobility/OOB;Home health PT    Does the patient have the potential to tolerate intense rehabilitation      Barriers to Discharge        Equipment Recommendations       Recommendations for Other Services     Frequency Min 3X/week    Precautions / Restrictions Precautions Precautions: Fall Precaution Comments: Has fallen x2 recently Restrictions Weight Bearing Restrictions: No   Pertinent Vitals/Pain 0/10 Seated BP: 108/44 Standing BP: 103/65      Mobility  Bed Mobility Bed Mobility: Not assessed Details for Bed Mobility Assistance: Pt in recliner upon arrival Transfers Sit to Stand: 4: Min guard;With upper extremity assist;With armrests;From chair/3-in-1 Stand to Sit: 4: Min guard;With upper extremity assist;To chair/3-in-1 Ambulation/Gait Ambulation/Gait Assistance: 4: Min assist Ambulation Distance (Feet): 75 Feet Assistive device: Rolling walker Ambulation/Gait Assistance Details: Pt with minA for safety  Gait Pattern: Step-through pattern;Decreased stride length;Trunk flexed Gait velocity: decreased General Gait Details: Pt reporting no dizziness with ambulation, educated pt on the importance of LE exercises prior to standing to reduce risk of  orthostatic hypotension    Exercises     PT Diagnosis: Difficulty walking  PT Problem List: Decreased activity tolerance;Decreased balance;Decreased mobility;Decreased knowledge of use of DME PT Treatment Interventions: DME instruction;Stair training;Gait training;Functional mobility training;Therapeutic exercise;Therapeutic activities;Balance training;Patient/family education   PT Goals Acute Rehab PT Goals PT Goal Formulation: With patient Time For Goal Achievement: 05/28/13 Potential to Achieve Goals: Good Pt will go Supine/Side to Sit: with modified independence;with HOB 0 degrees PT Goal: Supine/Side to Sit - Progress: Goal set today Pt will go Sit to Stand: with supervision;with upper extremity assist PT Goal: Sit to Stand - Progress: Goal set today Pt will Transfer Bed to Chair/Chair to Bed: with supervision (with RW) PT Transfer Goal: Bed to Chair/Chair to Bed - Progress: Goal set today Pt will Ambulate: 51 - 150 feet;with modified independence;with least restrictive assistive device PT Goal: Ambulate - Progress: Goal set today Pt will Go Up / Down Stairs: 3-5 stairs;with supervision;with least restrictive assistive device PT Goal: Up/Down Stairs - Progress: Goal set today  Visit Information  Last PT Received On: 05/14/13 Assistance Needed: +1    Subjective Data  Subjective: Pt recieved in chair, agreeable to PT   Prior Functioning  Home Living Lives With: Spouse Available Help at Discharge: Family;Available 24 hours/day Type of Home: House Home Access: Stairs to enter [employer] of Steps: 4 Entrance Stairs-Rails: Right;Left (He goes up them sideways holding onto one rail) Home Layout: One level Bathroom Shower/Tub: Walk-in shower;Door (takes walker in with him) Firefighter: Standard Bathroom Accessibility: Yes How Accessible: Accessible via walker Home Adaptive Equipment: Walker - rolling Prior Function Level of Independence: Needs  assistance;Independent with assistive device(s) (RW) Needs Assistance: Meal Prep;Light Housekeeping Meal Prep: Total  Light Housekeeping: Total Able to Take Stairs?: Yes (sideways with one rail) Driving: Yes (but not lately) Vocation: Retired Comments: HHPT Communication Communication: No difficulties Dominant Hand: Right    Cognition  Cognition Arousal/Alertness: Awake/alert Behavior During Therapy: WFL for tasks assessed/performed Overall Cognitive Status: Within Functional Limits for tasks assessed    Extremity/Trunk Assessment Right Upper Extremity Assessment RUE ROM/Strength/Tone: Within functional levels Left Upper Extremity Assessment LUE ROM/Strength/Tone: Within functional levels Right Lower Extremity Assessment RLE ROM/Strength/Tone: WFL for tasks assessed Left Lower Extremity Assessment LLE ROM/Strength/Tone: WFL for tasks assessed   Balance    End of Session PT - End of Session Equipment Utilized During Treatment: Gait belt Activity Tolerance: Patient tolerated treatment well Patient left: in chair;with chair alarm set;with family/visitor present  GP     05/14/2013, 4:12 PM Marvis Moeller, Student Physical Therapist Office #: [phone]

## 2013-05-14 NOTE — Evaluation (Signed)
Occupational Therapy Evaluation Patient Details Name: [name] MRN: [id number] DOB: [birth year] Today's Date: 05/14/2013 Time: 0818-0900 OT Time Calculation (min): 42 min  OT Assessment / Plan / Recommendation Clinical Impression  This 79 yo male admitted with syncope (2 recent falls) presents to acute OT with problems below. Will benefit from acute OT with follow up HHOT.    OT Assessment  Patient needs continued OT Services    Follow Up Recommendations  Home health OT    Barriers to Discharge None    Equipment Recommendations  None recommended by OT       Frequency  Min 2X/week    Precautions / Restrictions Precautions Precautions: Fall Precaution Comments: Has fallen x2 recently Restrictions Weight Bearing Restrictions: No   Pertinent Vitals/Pain BP sitting 92/49; BP standing 115/55    ADL  Eating/Feeding: Simulated;Independent Where Assessed - Eating/Feeding: Chair Grooming: Set up;Performed;Wash/dry hands Where Assessed - Grooming: Unsupported standing Upper Body Bathing: Simulated;Set up Where Assessed - Upper Body Bathing: Unsupported sitting Lower Body Bathing: Simulated;Min guard Where Assessed - Lower Body Bathing: Unsupported sit to stand Upper Body Dressing: Simulated;Set up Where Assessed - Upper Body Dressing: Unsupported sitting Lower Body Dressing: Performed;Minimal assistance (don socks) Where Assessed - Lower Body Dressing: Unsupported sit to stand Toilet Transfer: Min guard;Performed Toilet Transfer Method: Sit to Barista: Regular height toilet;Grab bars (has sink at home he pulls up on) Toileting - Architect and Hygiene: Min guard Where Assessed - Engineer, mining and Hygiene: Standing Equipment Used: Rolling walker;Gait belt Transfers/Ambulation Related to ADLs: Min guard A for all with RW ADL Comments: Wife usually A him with donning his socks, otherwise he is Independent/ Mod I with  BADLs. Talked with pt about possibly a shower seat in shower and grab bars instead of his RW--he was open to the grab bars but not really the seat--will try this with him next session to see if he is any more open to this option for safety    OT Diagnosis: Generalized weakness  OT Problem List: Decreased safety awareness;Impaired balance (sitting and/or standing) OT Treatment Interventions: Self-care/ADL training;Patient/family education;DME and/or AE instruction   OT Goals Acute Rehab OT Goals OT Goal Formulation: With patient Time For Goal Achievement: 05/21/13 Potential to Achieve Goals: Good ADL Goals Pt Will Perform Tub/Shower Transfer: with supervision;Ambulation;with DME (3n1 in shower or shower seat) ADL Goal: Tub/Shower Transfer - Progress: Goal set today  Visit Information  Last OT Received On: 05/14/13 Assistance Needed: +1    Subjective Data  Subjective: "I remember all of it starting to fall and falling; however I don't know what caused me to fall" Patient Stated Goal: Go home today   Prior Functioning     Home Living Lives With: Spouse Available Help at Discharge: Family;Available 24 hours/day Type of Home: House Home Access: Stairs to enter [employer] of Steps: 4 Entrance Stairs-Rails: Right;Left (He goes up them sideways holding onto one rail) Home Layout: One level Bathroom Shower/Tub: Walk-in shower;Door (takes walker in with him) Firefighter: Standard Bathroom Accessibility: Yes How Accessible: Accessible via walker Home Adaptive Equipment: Walker - rolling;Hand-held shower hose Prior Function Level of Independence: Needs assistance;Independent with assistive device(s) (RW) Needs Assistance: Meal Prep;Light Housekeeping Meal Prep: Total Light Housekeeping: Total Able to Take Stairs?: Yes (sideways with one rail) Driving: Yes (but not lately) Vocation: Retired Musician: No difficulties Dominant Hand: Right          Vision/Perception Vision - History Baseline Vision: Wears  glasses all the time Patient Visual Report: No change from baseline   Cognition  Cognition Arousal/Alertness: Awake/alert Behavior During Therapy: WFL for tasks assessed/performed Overall Cognitive Status: Within Functional Limits for tasks assessed    Extremity/Trunk Assessment Right Upper Extremity Assessment RUE ROM/Strength/Tone: Within functional levels Left Upper Extremity Assessment LUE ROM/Strength/Tone: Within functional levels     Mobility Bed Mobility Details for Bed Mobility Assistance: Pt in recliner upon arrival Transfers Transfers: Sit to Stand;Stand to Sit Sit to Stand: 4: Min guard;With upper extremity assist;With armrests;From chair/3-in-1 Stand to Sit: 4: Min guard;With upper extremity assist;To toilet (with grab bar)           End of Session OT - End of Session Equipment Utilized During Treatment: Gait belt Activity Tolerance: Patient tolerated treatment well Patient left: in chair;with call bell/phone within reach;with chair alarm set;with family/visitor present Nurse Communication: Mobility status (+1 min guard)       Evette Georges [phone] 05/14/2013, 9:17 AM

## 2013-05-14 NOTE — Evaluation (Signed)
Agree with PT evaluation.  Shatara Stanek, PT DPT [phone]  

## 2013-05-14 NOTE — Progress Notes (Signed)
[facility] PROGRESS NOTE  [name] ZOX:[id number] DOB: November 09, 1921 DOA: 05/13/2013 PCP: Willow Ora, MD  Brief narrative 79 year old male, PMH of CAD, CABG, chronic leg edema, tachybradycardia syndrome, status post PPM, HTN, A. fib, chronic diastolic CHF, prior V. tach, recurrent falls (at least 6 falls this year alone), admitted to [facility] on 05/13/13 with 2 episodes of transient syncope followed by fall and head injury. Both these episodes occurred when he got up from bed and took a few steps with his walker. No premonitory symptoms, chest pain, palpitations, dizziness or lightheadedness. In the ED, CT head showed frontal scalp hematoma, pacemaker interoperation was apparently normal and hospitalist admission was requested.   Assessment/Plan: 1. Syncope: May be secondary to hypotension/orthostatic hypotension in the context of relative intravascular volume depletion from diuresis. Blood pressure checks this morning were negative for orthostatic changes-does not mean that he doesn't have it at home. [place] cardiology was consulted to see if he would need any further evaluation/heart monitor or changes to his medications. Cardiology input appreciated-plan to re\re program his pacemaker and set his lower rate limit to 75 and reduce Lasix to 40 mg once daily and elevate legs as much as possible. 2. Recurrent falls/head injury this admission: Patient and spouse were advised that if he has recurrent falls, it will put him at high risk for increased morbidity and mortality. Do not think that spouse will be able to manage them by himself during the fall. Discussed option of NH- neither are clean about it but will think. 3. Chronic diastolic CHF: Management per cardiology. Lasix reduced to once daily. 4. Atrial fibrillation/PPM/CAD/CABG/chronic leg edema: Not candidate for anticoagulation secondary to recurrent falls and rectal bleeding. Management per cardiology. 5. Hypertension: Soft blood  pressures. 6. Chronic anemia: Stable.  Code Status: DO NOT RESUSCITATE Family Communication: Discussed with spouse at bedside Disposition Plan: Monitor overnight and consider DC on 6/13   Consultants:  [place] cardiology  Procedures:  None  Antibiotics:  None   HPI/Subjective: "I feel fine". Denies dizziness, lightheadedness, chest pain, palpitations or dyspnea. Ambulating in room with help of walker and assistance.  Objective: Filed Vitals:   05/14/13 0729 05/14/13 0821 05/14/13 0824 05/14/13 1351  BP: 102/60 92/49 115/55 130/72  Pulse: 62   74  Temp:    98.5 F (36.9 C)  TempSrc:    Oral  Resp:    18  Height:      Weight:      SpO2:    96%    Intake/Output Summary (Last 24 hours) at 05/14/13 1435 Last data filed at 05/14/13 1351  Gross per 24 hour  Intake    720 ml  Output   1525 ml  Net   -805 ml   Filed Weights   05/13/13 1811 05/14/13 0456  Weight: 92.897 kg (204 lb 12.8 oz) 91.173 kg (201 lb)    Exam:   General exam: Comfortable. Ambulating in room with walker.  Respiratory system: Clear. No increased work of breathing.  Cardiovascular system: S1 & S2 heard, RRR. No JVD, murmurs, gallops. 2+ pitting bilateral leg edema. Telemetry: AV paced rhythm in the 60s.  Gastrointestinal system: Abdomen is nondistended, soft and nontender. Normal bowel sounds heard.  Central nervous system: Alert and oriented. No focal neurological deficits.  Extremities: Symmetric 5 x 5 power.  Head, eyes and ENT: Dressing on the frontal hematoma: Clean dry and intact. Infraorbital ecchymosis bilaterally.   Data Reviewed: Basic Metabolic Panel:  Recent Labs Lab 05/08/13 1052 05/13/13 1409  05/13/13 1835  NA 140 138  --   K 4.2 4.3  --   CL 105 107  --   CO2 24 22  --   GLUCOSE 99 101*  --   BUN 18 18  --   CREATININE 1.1 1.04 0.95  CALCIUM 9.0 8.4  --    Liver Function Tests: No results found for this basename: AST, ALT, ALKPHOS, BILITOT, PROT, ALBUMIN,   in the last 168 hours No results found for this basename: LIPASE, AMYLASE,  in the last 168 hours No results found for this basename: AMMONIA,  in the last 168 hours CBC:  Recent Labs Lab 05/08/13 1052 05/13/13 1409 05/13/13 1835  WBC 12.2* 8.5 8.8  NEUTROABS 9.7* 6.0  --   HGB 11.0* 10.2* 11.1*  HCT 32.7* 31.1* 32.9*  MCV 89.3 87.9 87.0  PLT 258.0 222 238   Cardiac Enzymes:  Recent Labs Lab 05/13/13 1835 05/13/13 2307 05/14/13 0521  TROPONINI <0.30 <0.30 <0.30   BNP (last 3 results)  Recent Labs  03/06/13 1850 03/23/13 1720  PROBNP 1704.0* 2160.0*   CBG: No results found for this basename: GLUCAP,  in the last 168 hours  No results found for this or any previous visit (from the past 240 hour(s)).   Studies: Ct Head Wo Contrast  05/13/2013   *RADIOLOGY REPORT*  Clinical Data: Syncopal episode with fall.  CT HEAD WITHOUT CONTRAST  Technique:  Contiguous axial images were obtained from the base of the skull through the vertex without contrast.  Comparison: 07/26/2009.  Findings: Broad-based frontal hematoma without underlying fracture or intracranial hemorrhage.  Small vessel disease type changes without CT evidence of large acute infarct.  Atrophy.  Ventricular prominence may be related to atrophy.  Mild hydrocephalus not entirely excluded.  Appearance is without change.  Minimal mucosal thickening ethmoid sinus air cells and sphenoid sinus air cells.  IMPRESSION: Broad-based frontal hematoma without underlying fracture or intracranial hemorrhage.  Small vessel disease type changes without CT evidence of large acute infarct.  Atrophy.  Ventricular prominence may be related to atrophy.  Mild hydrocephalus not entirely excluded.  Appearance is without change.   Original Report Authenticated By: Lacy Duverney, M.D.     Additional labs:   Scheduled Meds: . aspirin EC  81 mg Oral Daily  . B-complex with vitamin C  1 tablet Oral Daily  . carvedilol  12.5 mg Oral BID WC  .  enoxaparin (LOVENOX) injection  40 mg Subcutaneous Q24H  . furosemide  40 mg Oral BID  . potassium chloride  10 mEq Oral QPM  . sodium chloride  3 mL Intravenous Q12H   Continuous Infusions:   Principal Problem:   SYNCOPE Active Problems:   HYPERTENSION   CAD, ARTERY BYPASS GRAFT   VENTRICULAR TACHYCARDIA   Atrial fibrillation   CONGESTIVE HEART FAILURE    Time spent: 40 minutes.    [facility]  [facility] Pager [phone].   If 8PM-8AM, please contact night-coverage at [email or web address], password [email or web address] 05/14/2013, 2:35 PM  LOS: 1 day

## 2013-05-14 NOTE — ED Provider Notes (Signed)
I saw and evaluated the patient, reviewed the resident's note and I agree with the findings and plan. 79 yo man had syncopal episode. He had gotten up to go to the bathroom, lost consciousness and fell, lacerating his scaple and right hand. He has had several prior falls, not associated with syncope. He has Atrial fibrillation and has been taken off of Xarelto because of frequent falls. Exam shows skin tears on the scalp and right hand. He is awake and alert, seems neurologically intact. EKG shows a sinus rhythm and axis change. Lab workup ortherwise unremarkable. Recommended admission for observation post syncope.       Carleene Cooper III, MD 05/14/13 [phone]

## 2013-05-14 NOTE — Progress Notes (Signed)
Per MD note, pt to be on once a day lasix. Pt state he has already had lasix today and he is "too dry". Benedetto Coons on call notified and changed order to lasix 1x/day per cardiologist note today. Baron Hamper, RN 05/14/2013

## 2013-05-14 NOTE — Progress Notes (Signed)
UR COMPLETED  

## 2013-05-15 DIAGNOSIS — I951 Orthostatic hypotension: Principal | ICD-10-CM | POA: Diagnosis not present

## 2013-05-15 MED ORDER — FUROSEMIDE 40 MG PO TABS: 40.0000 mg | ORAL_TABLET | Freq: Every day | ORAL | Status: DC

## 2013-05-15 MED ORDER — CEPHALEXIN 250 MG PO CAPS
250.0000 mg | ORAL_CAPSULE | Freq: Three times a day (TID) | ORAL | Status: DC
  Administered 2013-05-15: 250 mg via ORAL
  Filled 2013-05-15 (×3): qty 1

## 2013-05-15 MED ORDER — CEPHALEXIN 250 MG PO CAPS: 250.0000 mg | ORAL_CAPSULE | Freq: Three times a day (TID) | ORAL | Status: DC

## 2013-05-15 NOTE — Progress Notes (Signed)
Patient: [name] Date of Encounter: 05/15/2013, 8:56 AM Admit date: 05/13/2013     Subjective  [name] feels better this AM. He denies CP, SOB, palpitations or dizziness.   Objective  Physical Exam: Vitals: BP 134/67  Pulse 86  Temp(Src) 97.8 F (36.6 C) (Oral)  Resp 18  Ht 5\' 9"  (1.753 m)  Wt 199 lb 1.6 oz (90.311 kg)  BMI 29.39 kg/m2  SpO2 96% General: Well developed, well appearing 79 year old male in no acute distress. Neck: Supple. JVD not elevated. Lungs: Clear bilaterally to auscultation without wheezes, rales, or rhonchi. Breathing is unlabored. Heart: RRR S1 S2 with II/VI systolic murmu. No rub or gallop.  Abdomen: Soft, non-distended. Extremities: No clubbing or cyanosis. 1+ pedal edema bilaterally with venous stasis changes.  Erythema left lwoer leg  Neuro: Alert and oriented X 3. Moves all extremities spontaneously. No focal deficits.  Intake/Output:  Intake/Output Summary (Last 24 hours) at 05/15/13 0856 Last data filed at 05/15/13 0835  Gross per 24 hour  Intake    840 ml  Output   1300 ml  Net   -460 ml    Inpatient Medications:  . aspirin EC  81 mg Oral Daily  . B-complex with vitamin C  1 tablet Oral Daily  . carvedilol  12.5 mg Oral BID WC  . enoxaparin (LOVENOX) injection  40 mg Subcutaneous Q24H  . furosemide  40 mg Oral Daily  . potassium chloride  10 mEq Oral QPM  . sodium chloride  3 mL Intravenous Q12H    Labs:  Recent Labs  05/13/13 1409 05/13/13 1835  NA 138  --   K 4.3  --   CL 107  --   CO2 22  --   GLUCOSE 101*  --   BUN 18  --   CREATININE 1.04 0.95  CALCIUM 8.4  --     Recent Labs  05/13/13 1409 05/13/13 1835  WBC 8.5 8.8  NEUTROABS 6.0  --   HGB 10.2* 11.1*  HCT 31.1* 32.9*  MCV 87.9 87.0  PLT 222 238    Recent Labs  05/13/13 1835 05/13/13 2307 05/14/13 0521  TROPONINI <0.30 <0.30 <0.30    Radiology/Studies: Ct Head Wo Contrast  05/13/2013   *RADIOLOGY REPORT*  Clinical Data: Syncopal  episode with fall.  CT HEAD WITHOUT CONTRAST  Technique:  Contiguous axial images were obtained from the base of the skull through the vertex without contrast.  Comparison: 07/26/2009.  Findings: Broad-based frontal hematoma without underlying fracture or intracranial hemorrhage.  Small vessel disease type changes without CT evidence of large acute infarct.  Atrophy.  Ventricular prominence may be related to atrophy.  Mild hydrocephalus not entirely excluded.  Appearance is without change.  Minimal mucosal thickening ethmoid sinus air cells and sphenoid sinus air cells.  IMPRESSION: Broad-based frontal hematoma without underlying fracture or intracranial hemorrhage.  Small vessel disease type changes without CT evidence of large acute infarct.  Atrophy.  Ventricular prominence may be related to atrophy.  Mild hydrocephalus not entirely excluded.  Appearance is without change.   Original Report Authenticated By: [name], M.D.    Telemetry: AV paced Device interrogation: performed by industry shows normal PPM function with stable lead measurements and battery longevity 2 years. No AHR or VHR episodes.    Assessment and Plan  1. Syncope Suspected orthostasis aggravated by intravascular volume depletion No arrhythmias documented on device interrogation  Lasix dose reduced yesterday Recommend compression stockings be  worn daily 2. Recurrent falls Xarelto discontinued recently 3. PAF Maintaining SR; continue BB As above, off anticoagulation currently (see recent note by Dr. Riley Kill) due to falls  Dr. Graciela Husbands to see Signed, EDMISTEN, BROOKE PA-C  As above  fordischarge today Issue of anticoagulation and falls remains tricky Antibiotics for cellulitsi

## 2013-05-15 NOTE — Progress Notes (Signed)
Physical Therapy Treatment Patient Details Name: [name] MRN: [id number] DOB: [birth year] Today's Date: 05/15/2013 Time: 4098-1191 PT Time Calculation (min): 15 min  PT Assessment / Plan / Recommendation Comments on Treatment Session  Pt moving well.  No reports of dizziness with ambulation.  He was able to increase ambulation distance as well as go up/down 4 steps today.      Follow Up Recommendations  Supervision/Assistance - 24 hour;Supervision for mobility/OOB;Home health PT     Does the patient have the potential to tolerate intense rehabilitation     Barriers to Discharge        Equipment Recommendations       Recommendations for Other Services    Frequency Min 3X/week   Plan Discharge plan remains appropriate;Frequency remains appropriate    Precautions / Restrictions Precautions Precautions: Fall Precaution Comments: Has fallen x2 recently Restrictions Weight Bearing Restrictions: No       Mobility  Bed Mobility Bed Mobility: Not assessed Transfers Transfers: Sit to Stand;Stand to Sit Sit to Stand: 4: Min guard;With upper extremity assist;With armrests;From chair/3-in-1 Stand to Sit: 5: Supervision;With upper extremity assist;With armrests;To chair/3-in-1 Details for Transfer Assistance: Pt requesting for a "boost" but he was able to perform by himself.   Ambulation/Gait Ambulation/Gait Assistance: 4: Min guard Ambulation Distance (Feet): 150 Feet Assistive device: Rolling walker Ambulation/Gait Assistance Details: Cues for tall posture.   Pt steady.   Gait Pattern: Step-through pattern;Decreased stride length Stairs: Yes Stairs Assistance: 4: Min guard Stair Management Technique: One rail Left;Sideways;Step to pattern Number of Stairs: 4     PT Goals Acute Rehab PT Goals Time For Goal Achievement: 05/28/13 Potential to Achieve Goals: Good Pt will go Supine/Side to Sit: with modified independence;with HOB 0 degrees Pt will go Sit to Stand:  with supervision;with upper extremity assist PT Goal: Sit to Stand - Progress: Progressing toward goal Pt will Transfer Bed to Chair/Chair to Bed: with supervision Pt will Ambulate: 51 - 150 feet;with modified independence;with least restrictive assistive device PT Goal: Ambulate - Progress: Progressing toward goal Pt will Go Up / Down Stairs: 3-5 stairs;with supervision;with least restrictive assistive device PT Goal: Up/Down Stairs - Progress: Progressing toward goal  Visit Information  Last PT Received On: 05/15/13 Assistance Needed: +1    Subjective Data      Cognition  Cognition Arousal/Alertness: Awake/alert Behavior During Therapy: WFL for tasks assessed/performed Overall Cognitive Status: Within Functional Limits for tasks assessed    Balance     End of Session PT - End of Session Equipment Utilized During Treatment: Gait belt Activity Tolerance: Patient tolerated treatment well Patient left: in chair;with call bell/phone within reach Nurse Communication: Mobility status     Verdell Face, [place] [phone] 05/15/2013

## 2013-05-15 NOTE — Progress Notes (Signed)
Occupational Therapy Treatment Patient Details Name: [name] MRN: [id number] DOB: July 27, 1921 Today's Date: 05/15/2013 Time: 6962-9528 OT Time Calculation (min): 19 min  OT Assessment / Plan / Recommendation Comments on Treatment Session Pt overall min guard assist to close supervision for transfers, including shower transfers.  Pt open to getting grab bars in his shower but still does not seem to understand the need for a shower seat even though he has had other falls at home.  His wife plans to purchase a small platic chair at discharge however.    Follow Up Recommendations  Home health OT       Equipment Recommendations  None recommended by OT       Frequency Min 2X/week   Plan Discharge plan remains appropriate    Precautions / Restrictions Precautions Precautions: Fall Precaution Comments: Has fallen x2 recently Restrictions Weight Bearing Restrictions: No   Pertinent Vitals/Pain No report of pain or dizziness    ADL  Tub/Shower Transfer: Simulated;Supervision/safety Tub/Shower Transfer Method: Science writer: Walk in shower Equipment Used: Rolling walker Transfers/Ambulation Related to ADLs: Pt close supervision for mobility with use of the RW. ADL Comments: [name] present for session.  Therapist discussed options for shower transfers and use of a plastic shower seat for energy conservation and from a safety standpoint.  Also discussed placement of grab bars to assist with transfer.  Pt still reluctant to hve a shower seat and still wants to take his walker in the shower and stand.  [name] understanding of need for seat and will try to get him to use one.       OT Goals ADL Goals Pt Will Perform Tub/Shower Transfer: with supervision;Ambulation;with DME ADL Goal: Tub/Shower Transfer - Progress: Met  Visit Information  Last OT Received On: 05/15/13 Assistance Needed: +1    Subjective Data  Subjective: I take my walker in the  shower with me. Patient Stated Goal: Pt did not state.      Cognition  Cognition Arousal/Alertness: Awake/alert Behavior During Therapy: WFL for tasks assessed/performed Overall Cognitive Status: Impaired/Different from baseline Area of Impairment: Safety/judgement Safety/Judgement: Decreased awareness of safety General Comments: Pt not understanding the need for a shower seat at this time.    Mobility  Bed Mobility Bed Mobility: Not assessed Transfers Transfers: Sit to Stand Sit to Stand: 4: Min guard;With upper extremity assist;From chair/3-in-1 Stand to Sit: 4: Min guard;With upper extremity assist;To chair/3-in-1 Details for Transfer Assistance: Pt needed 2 attempts to complete standing from chair without physical assist.  Also needs min instructional cueing to reach back to the chair and turn the walker with him before sitting.         Balance Balance Balance Assessed: Yes Dynamic Standing Balance Dynamic Standing - Balance Support: Right upper extremity supported;Left upper extremity supported Dynamic Standing - Level of Assistance: 5: Stand by assistance   End of Session OT - End of Session Activity Tolerance: Patient tolerated treatment well Patient left: in chair;with call bell/phone within reach;with family/visitor present Nurse Communication: Mobility status     [name] OTR/L Pager number [phone] 05/15/2013, 10:38 AM

## 2013-05-15 NOTE — Progress Notes (Signed)
[facility]  Patient Status: Active (receiving services up to time of hospitalization)  [facility] is providing the following services: RN.  PT & OT will be added back at the time of discharge per MD order.  If patient discharges after hours, please call [phone].   Wynelle Bourgeois 05/15/2013, 2:54 PM

## 2013-05-15 NOTE — Care Management Note (Signed)
    Page 1 of 1   05/15/2013     2:45:51 PM   CARE MANAGEMENT NOTE 05/15/2013  Patient:  [name], [name]   Account Number:  [id number]  Date Initiated:  05/15/2013  Documentation initiated by:  Tera Mater  Subjective/Objective Assessment:   79yo male admitted with Syncope and S/P fall.  Pt. lives with spouse.     Action/Plan:   discharge planning   Anticipated DC Date:  05/15/2013   Anticipated DC Plan:  HOME W HOME HEALTH SERVICES         [facility]  Resumption Of Svcs/PTA Provider   Choice offered to / List presented to:  C-1 Patient        HH arranged  HH-1 RN  HH-2 PT  HH-3 OT      [facility] agency  [facility]   Status of service:  Completed, signed off Medicare Important Message given?   (If response is "NO", the following Medicare IM given date fields will be blank) Date Medicare IM given:   Date Additional Medicare IM given:    Discharge Disposition:  HOME W HOME HEALTH SERVICES  Per UR Regulation:  Reviewed for med. necessity/level of care/duration of stay  If discussed at Long Length of Stay Meetings, dates discussed:    Comments:  05/15/13 1430 In to speak with pt. about home health services.  Pt. sleeping at this time, and spouse at bedside.  Spouse states pt. is being seen by [facility] PTA.  TC to Lupita Leash, with [facility], to give resumption of care orders for [facility] RN, and new referral for [facility] PT/OT.  Pt. to dc home today. Tera Mater, RN, BSN NCM [phone]

## 2013-05-15 NOTE — Discharge Summary (Signed)
Physician Discharge Summary  [name] ZOX:[id number] DOB: September 22, 1921 DOA: 05/13/2013  PCP: Willow Ora, MD  Admit date: 05/13/2013 Discharge date: 05/15/2013  Time spent: Greater than 30 minutes  Recommendations for Outpatient Follow-up:  1. Dr. Willow Ora, PCP in 5 days. 2. Dr. Tonny Bollman, Cardiology in 2 weeks. 3. Home Health PT & OT. 4. Lower extremity compression stockings.  Discharge Diagnoses:  Principal Problem:   SYNCOPE Active Problems:   HYPERTENSION   CAD, ARTERY BYPASS GRAFT   VENTRICULAR TACHYCARDIA   Atrial fibrillation   CONGESTIVE HEART FAILURE   Recurrent falls   Discharge Condition: Improved & Stable  Diet recommendation: Heart Healthy diet.  Filed Weights   05/13/13 1811 05/14/13 0456 05/15/13 0645  Weight: 92.897 kg (204 lb 12.8 oz) 91.173 kg (201 lb) 90.311 kg (199 lb 1.6 oz)    History of present illness:  79 year old male, PMH of CAD, CABG, chronic leg edema, tachybradycardia syndrome, status post PPM, HTN, A. fib, chronic diastolic CHF, prior V. tach, recurrent falls (at least 6 falls this year alone), admitted to [facility] on 05/13/13 with 2 episodes of transient syncope followed by fall and head injury. Both these episodes occurred when he got up from bed and took a few steps with his walker. No premonitory symptoms, chest pain, palpitations, dizziness or lightheadedness. In the ED, CT head showed frontal scalp hematoma, pacemaker interoperation was apparently normal and hospitalist admission was requested.  Hospital Course:  1. Syncope: Suspected do to orthostatic hypotension aggravated by intravascular volume depletion from diuretics. Orthostatic blood pressure checks in the hospital however were negative which does not mean that he doesn't have orthostatic changes at home. Cardiology consulted. They decreased Lasix dose and recommended compression stockings and lounge doctor rest. They also changed settings on his pacemaker to a higher rate. No  arrhythmias documented on device interrogation. He has been cleared by cardiology for discharge home. PT OT has seen him and recommend home health services. Patient's wife provides 24/7 supervision and is looking into adding assistance at home.  2. Recurrent falls/head injury this admission: Patient and spouse were advised that if he has recurrent falls, it will put him at high risk for increased morbidity and mortality. Do not think that spouse will be able to manage them by himself during the fall. Discussed option of NH- neither are keen about it and at this time have decided to return home. 3. Chronic diastolic CHF: Management per cardiology. Lasix reduced to once daily. 4. Atrial fibrillation/PPM/CAD/CABG/chronic leg edema: Not candidate for anticoagulation secondary to recurrent falls and rectal bleeding. Management per cardiology. Continue beta blockers. 5. Hypertension: Soft blood pressures. 6. Chronic anemia: Stable. 7. ? Left leg cellulitis: Complete 5 days of oral Keflex.   Procedures:  None   Consultations:  Cardiology  Discharge Exam:  Complaints: Denies complaints and eager to go home. Denies dizziness or lightheadedness on ambulation. No chest pain, dyspnea or palpitations.  Filed Vitals:   05/14/13 2013 05/14/13 2014 05/15/13 0645 05/15/13 1319  BP: 112/59 123/64 134/67 119/57  Pulse: 77 77 86 78  Temp:   97.8 F (36.6 C) 97.4 F (36.3 C)  TempSrc:   Oral Oral  Resp:   18 18  Height:      Weight:   90.311 kg (199 lb 1.6 oz)   SpO2:   96% 94%     General exam: Comfortable.   Respiratory system: Clear. No increased work of breathing.   Cardiovascular system: S1 & S2 heard, RRR.  No JVD, murmurs, gallops. 2+ pitting bilateral leg edema. Telemetry: AV paced rhythm in the 60s.   Gastrointestinal system: Abdomen is nondistended, soft and nontender. Normal bowel sounds heard.   Central nervous system: Alert and oriented. No focal neurological deficits.    Extremities: Symmetric 5 x 5 power. Mild redness of left leg but without increased warmth or tenderness. Has superficial skin tear on right dorsal fingers.  Head, eyes and ENT: Superficial skin tear on frontal scalp with no active bleeding. Infraorbital ecchymosis bilaterally.   Discharge Instructions      Discharge Orders   Future Appointments Provider Department Dept Phone   05/26/2013 2:30 PM Wanda Plump, MD [facility]  [place] [phone]   07/15/2013 2:30 PM Tonny Bollman, MD [facility] Main Office [place]) [phone]   Future Orders Complete By Expires     (HEART FAILURE PATIENTS) Call MD:  Anytime you have any of the following symptoms: 1) 3 pound weight gain in 24 hours or 5 pounds in 1 week 2) shortness of breath, with or without a dry hacking cough 3) swelling in the hands, feet or stomach 4) if you have to sleep on extra pillows at night in order to breathe.  As directed     Call MD for:  difficulty breathing, headache or visual disturbances  As directed     Call MD for:  extreme fatigue  As directed     Call MD for:  persistant dizziness or light-headedness  As directed     Call MD for:  redness, tenderness, or signs of infection (pain, swelling, redness, odor or green/yellow discharge around incision site)  As directed     Call MD for:  severe uncontrolled pain  As directed     Call MD for:  temperature >100.4  As directed     Diet - low sodium heart healthy  As directed     Discharge instructions  As directed     Comments:      1. Use lower extremity compression stockings, especially when ambulating.  2. Also recommend Lounge doctor Leg rest (discussed with you by the cardiologist).    Increase activity slowly  As directed         Medication List    TAKE these medications       acetaminophen 500 MG tablet  Commonly known as:  TYLENOL  Take 500 mg by mouth every 8 (eight) hours as needed for pain.     aspirin 81 MG tablet  Take 81  mg by mouth daily.     b complex vitamins tablet  Take 1 tablet by mouth daily.     beta carotene w/minerals tablet  Take 1 tablet by mouth daily.     carvedilol 12.5 MG tablet  Commonly known as:  COREG  Take 12.5 mg by mouth 2 (two) times daily with a meal.     cephALEXin 250 MG capsule  Commonly known as:  KEFLEX  Take 1 capsule (250 mg total) by mouth every 8 (eight) hours.     furosemide 40 MG tablet  Commonly known as:  LASIX  Take 1 tablet (40 mg total) by mouth daily.     potassium chloride 10 MEQ tablet  Commonly known as:  K-DUR  Take 10 mEq by mouth every evening.       Follow-up Information   Follow up with Willow Ora, MD. Schedule an appointment as soon as possible for a visit in 5 days.   Contact  information:   [address] [facility] [address] [place] [address] [phone]       Follow up with Tonny Bollman, MD. Schedule an appointment as soon as possible for a visit in 2 weeks.   Contact information:   [address] [place] [address] [phone]        The results of significant diagnostics from this hospitalization (including imaging, microbiology, ancillary and laboratory) are listed below for reference.    Significant Diagnostic Studies: Ct Head Wo Contrast  05/13/2013   *RADIOLOGY REPORT*  Clinical Data: Syncopal episode with fall.  CT HEAD WITHOUT CONTRAST  Technique:  Contiguous axial images were obtained from the base of the skull through the vertex without contrast.  Comparison: 07/26/2009.  Findings: Broad-based frontal hematoma without underlying fracture or intracranial hemorrhage.  Small vessel disease type changes without CT evidence of large acute infarct.  Atrophy.  Ventricular prominence may be related to atrophy.  Mild hydrocephalus not entirely excluded.  Appearance is without change.  Minimal mucosal thickening ethmoid sinus air cells and sphenoid sinus air cells.  IMPRESSION: Broad-based frontal  hematoma without underlying fracture or intracranial hemorrhage.  Small vessel disease type changes without CT evidence of large acute infarct.  Atrophy.  Ventricular prominence may be related to atrophy.  Mild hydrocephalus not entirely excluded.  Appearance is without change.   Original Report Authenticated By: Lacy Duverney, M.D.    Microbiology: No results found for this or any previous visit (from the past 240 hour(s)).   Labs: Basic Metabolic Panel:  Recent Labs Lab 05/13/13 1409 05/13/13 1835  NA 138  --   K 4.3  --   CL 107  --   CO2 22  --   GLUCOSE 101*  --   BUN 18  --   CREATININE 1.04 0.95  CALCIUM 8.4  --    Liver Function Tests: No results found for this basename: AST, ALT, ALKPHOS, BILITOT, PROT, ALBUMIN,  in the last 168 hours No results found for this basename: LIPASE, AMYLASE,  in the last 168 hours No results found for this basename: AMMONIA,  in the last 168 hours CBC:  Recent Labs Lab 05/13/13 1409 05/13/13 1835  WBC 8.5 8.8  NEUTROABS 6.0  --   HGB 10.2* 11.1*  HCT 31.1* 32.9*  MCV 87.9 87.0  PLT 222 238   Cardiac Enzymes:  Recent Labs Lab 05/13/13 1835 05/13/13 2307 05/14/13 0521  TROPONINI <0.30 <0.30 <0.30   BNP: BNP (last 3 results)  Recent Labs  03/06/13 1850 03/23/13 1720  PROBNP 1704.0* 2160.0*   CBG: No results found for this basename: GLUCAP,  in the last 168 hours  Additional labs:    Signed:  HONGALGI,ANAND  [facility] 05/15/2013, 2:34 PM

## 2013-05-16 ENCOUNTER — Telehealth: Payer: Self-pay | Admitting: Internal Medicine

## 2013-05-16 NOTE — Telephone Encounter (Signed)
Needs hospital followup this week, please arrange, overbook okay.

## 2013-05-18 ENCOUNTER — Telehealth: Payer: Self-pay | Admitting: Cardiovascular Disease

## 2013-05-18 ENCOUNTER — Ambulatory Visit (INDEPENDENT_AMBULATORY_CARE_PROVIDER_SITE_OTHER): Payer: Medicare Other | Admitting: *Deleted

## 2013-05-18 DIAGNOSIS — R55 Syncope and collapse: Secondary | ICD-10-CM

## 2013-05-18 NOTE — Progress Notes (Signed)
Pt concerned that lower rate was not changed at hosptial from 60 to 75. Interrogated ppm and lower rate had been changed. Although sleep is turned on with sleep rate at 60 from 1145p to 630a. Will speak with SK about sleep being on and turn off feature if requested.

## 2013-05-18 NOTE — Telephone Encounter (Signed)
New problem   Amber/[facility] stated pt had a fall this morning around 9:30 and BP is 122/78 hr 80 sitting and 102/61 hr 76 while standing. Please call [facility]

## 2013-05-18 NOTE — Telephone Encounter (Signed)
LMTCB

## 2013-05-18 NOTE — Telephone Encounter (Signed)
Pt scheduled 05/21/13

## 2013-05-19 ENCOUNTER — Telehealth: Payer: Self-pay | Admitting: *Deleted

## 2013-05-19 NOTE — Telephone Encounter (Signed)
Noted, he has a followup with me in 2 days

## 2013-05-19 NOTE — Telephone Encounter (Signed)
Nepal w/ [facility] called stating that the pt fell around 3am this morning. EMS came to the pt's house to check on the pt & the pt was fine, no injuries.

## 2013-05-20 ENCOUNTER — Other Ambulatory Visit: Payer: Self-pay

## 2013-05-20 NOTE — Telephone Encounter (Signed)
Amber from [facility] calls back today b/c pt has had 3 falls this week.   He has not fallen today nor has he been orthostatic according to [facility] ? Because he ran out of his Carvedilol yesterday.  States pt has also lost 15 pounds recently.  Would like Dr. Excell Seltzer to know pt is usually orthostatic between 3am & 9am daily.  Pt was orthostatic Monday when she saw him: Sitting: 122/78   Standing: 102/68  Tuesday: pt fell & EMS responded orthostatic & heart rate (pacer) at 60  Today his blood pressures are:  Sitting: 112/74    Standing:  112/70 Pt has appt with Dr. Excell Seltzer tomorrow & will not be taking his Carvedilol until after he has seen MD as he ran out of this yesterday. Mylo Red RN

## 2013-05-20 NOTE — Telephone Encounter (Signed)
Follow up   Pt has had 3 falls this week in the home. Pt has had ortho static BP of 122/78 sitting and 102/68 standing. Hr 75.Please call [facility]

## 2013-05-21 ENCOUNTER — Encounter: Payer: Self-pay | Admitting: Internal Medicine

## 2013-05-21 ENCOUNTER — Ambulatory Visit (INDEPENDENT_AMBULATORY_CARE_PROVIDER_SITE_OTHER): Payer: Medicare Other | Admitting: Cardiology

## 2013-05-21 ENCOUNTER — Encounter: Payer: Self-pay | Admitting: Cardiology

## 2013-05-21 ENCOUNTER — Ambulatory Visit (INDEPENDENT_AMBULATORY_CARE_PROVIDER_SITE_OTHER): Payer: Medicare Other | Admitting: Internal Medicine

## 2013-05-21 VITALS — BP 136/84 | HR 82 | Temp 97.7°F | Wt 203.0 lb

## 2013-05-21 VITALS — BP 120/67 | HR 74 | Ht 69.0 in | Wt 203.0 lb

## 2013-05-21 DIAGNOSIS — I5032 Chronic diastolic (congestive) heart failure: Secondary | ICD-10-CM

## 2013-05-21 DIAGNOSIS — I495 Sick sinus syndrome: Secondary | ICD-10-CM

## 2013-05-21 DIAGNOSIS — R269 Unspecified abnormalities of gait and mobility: Secondary | ICD-10-CM

## 2013-05-21 DIAGNOSIS — L03116 Cellulitis of left lower limb: Secondary | ICD-10-CM

## 2013-05-21 DIAGNOSIS — R55 Syncope and collapse: Secondary | ICD-10-CM

## 2013-05-21 DIAGNOSIS — Z950 Presence of cardiac pacemaker: Secondary | ICD-10-CM

## 2013-05-21 DIAGNOSIS — L02419 Cutaneous abscess of limb, unspecified: Secondary | ICD-10-CM

## 2013-05-21 DIAGNOSIS — I10 Essential (primary) hypertension: Secondary | ICD-10-CM

## 2013-05-21 DIAGNOSIS — I951 Orthostatic hypotension: Secondary | ICD-10-CM

## 2013-05-21 DIAGNOSIS — I4891 Unspecified atrial fibrillation: Secondary | ICD-10-CM

## 2013-05-21 LAB — PACEMAKER DEVICE OBSERVATION
AL THRESHOLD: 0.5 V
ATRIAL PACING PM: 90.5
RV LEAD IMPEDENCE PM: 415 Ohm
RV LEAD THRESHOLD: 0.75 V

## 2013-05-21 MED ORDER — DOXYCYCLINE HYCLATE 100 MG PO TABS: 100.0000 mg | ORAL_TABLET | Freq: Two times a day (BID) | ORAL | Status: DC

## 2013-05-21 MED ORDER — CARVEDILOL 6.25 MG PO TABS: 6.2500 mg | ORAL_TABLET | Freq: Two times a day (BID) | ORAL | Status: DC

## 2013-05-21 NOTE — Assessment & Plan Note (Signed)
Cellulitis, left pretibial area seems slightly red and warm, was discharged from the hospital on Keflex, I am switching him to doxycycline. He will let me know if he is not improving.

## 2013-05-21 NOTE — Assessment & Plan Note (Signed)
Syncope, see history of present illness, recommend to keep the appointment to see cardiology this afternoon as syncope could be cardiovascular related.

## 2013-05-21 NOTE — Patient Instructions (Addendum)
See cardiology as planned this afternoon Stop Keflex, start doxycycline 1 tablet twice a day for one week Keep the legs elevated to prevent swelling, if the redness in the left leg is not getting better let me know. Next visit here one month

## 2013-05-21 NOTE — Assessment & Plan Note (Signed)
BP is stable today

## 2013-05-21 NOTE — Patient Instructions (Addendum)
CALL OFFICE IN 1-2 WEEKS TO CHECK IN AND CALL OFFICE SOONER IF YOU FALL TO LET us KNOW  Your physician recommends that you schedule a follow-up appointment in: 2 MONTHS OFFICE VISIT WITH DR. Graciela Husbands  Your physician has recommended you make the following change in your medication:   DECREASE YOUR COREG TO 6.25 MG TWICE A DAY (TWELVE HOURS APART)  KEEP YOUR APPOINTMENT IN [place]

## 2013-05-21 NOTE — Assessment & Plan Note (Signed)
frequent falls, most of the falls happen at night when she goes to the bathroom, I recommend the use of that urine all other a bedside commode but the patient is quite reluctant.

## 2013-05-21 NOTE — Progress Notes (Signed)
Subjective:    Patient ID: [name], male    DOB: 07-Feb-1921, 79 y.o.   MRN: [id number]  HPI Admitted to the hospital 05/13/2012 after a syncopal episode, chart reviewed, see below.  Syncope: Suspected do to orthostatic. Orthostatic blood pressure checks in the hospital however were negative which does not mean that he doesn't have orthostatic changes at home.  Cardiology consulted. They decreased Lasix , changed settings on his pacemaker to a higher rate. No arrhythmias documented on device interrogation.   Recurrent falls/head injury this admission:  Discussed option of NH but the pt  and wife  decided to return home With PT OT.  Chronic diastolic CHF: Management per cardiology. Lasix reduced to once daily.  Atrial fibrillation/PPM/CAD/CABG/chronic leg edema: Not candidate for anticoagulation secondary to recurrent falls and rectal bleeding.   Hypertension: Soft blood pressures.  Chronic anemia: Stable.  ? Left leg cellulitis: Complete 5 days of oral Keflex.    Labs and x-ray:  BMP stable, hemoglobin 11.1 which is  good for him. CT head: Broad-based frontal hematoma without underlying fracture or  intracranial hemorrhage.  Small vessel disease type changes without CT evidence of large  acute infarct.  Atrophy. Ventricular prominence may be related to atrophy. Mild  hydrocephalus not entirely excluded. Appearance is without change.   Past Medical History  Diagnosis Date  . A-fib     paroxysmal, was on coumadin , pt desired to stop it , no longer a candidate per cards  . Tachy-brady syndrome   . CAD (coronary artery disease)     MI angioplasty 17, s/p CABG 01/2006- pacemaker  (12-2006), NSTMI  7-11  . CHF (congestive heart failure)   . Hyperlipidemia   . Hypertension   . Spinal stenosis      has seen Dr Ethelene Hal before, s/p local injection which helped x a while   . GERD (gastroesophageal reflux disease)   . RLS (restless legs syndrome)     ? of  . Insomnia   . History  of BPH     TURP '96 at [facility] 96, urethoplast 97 ( History of bladder outlet obstruction, chronic incontinence)  . Pacemaker   . B12 deficiency anemia   . Heart murmur   . Anginal pain     "not in a long time" (05/14/2013)  . Myocardial infarction 01/1992; 06/2010  . Exertional shortness of breath   . Arthritis     back   Past Surgical History  Procedure Laterality Date  . Coronary artery bypass graft      "CABG X4" (05/14/2013)  . Cystoscopy with urethral dilatation  1997    "fixed strictures from TURP" (05/14/2013)  . Inguinal hernia repair Right   . Carpal tunnel release Bilateral ~ 2005    Dr Teressa Senter  . Insert / replace / remove pacemaker  12/2006    PPM [employer]  . Tee without cardioversion N/A 03/27/2013    Procedure: TRANSESOPHAGEAL ECHOCARDIOGRAM (TEE);  Surgeon: Lewayne Bunting, MD;  Location: [facility] ENDOSCOPY;  Service: Cardiovascular;  Laterality: N/A;  . Cardioversion N/A 03/27/2013    Procedure: CARDIOVERSION;  Surgeon: Lewayne Bunting, MD;  Location: [facility] ENDOSCOPY;  Service: Cardiovascular;  Laterality: N/A;  . Total knee arthroplasty Bilateral     right 2005, Left 2006.  Dr  Despina Hick  . Coronary angioplasty      "Dr. Riley Kill"  . Cataract extraction w/ intraocular lens  implant, bilateral Bilateral 2012  . Transurethral resection of prostate  1996  Review of Systems  Is here with his wife. Since he left the hospital he had 2 additional falls,  typically what happens is that he simply blacks out  and as soon as he hits the floor he wakes up. No evidence of seizures, bladder or bowel incontinence or chest pain. The patient reports that his pacemaker has been adjusted.    Objective:   Physical Exam  Skin:       General -- alert, well-developed,Nad.    Lungs -- normal respiratory effort, no intercostal retractions, no accessory muscle use, and normal breath sounds.   Heart-- normal rate, regular rhythm, no murmur, and no gallop.   Psych-- Cognition and judgment appear  intact. Alert and cooperative with normal attention span and concentration.  not anxious appearing and not depressed appearing.       Assessment & Plan:

## 2013-05-21 NOTE — Progress Notes (Signed)
ELECTROPHYSIOLOGY OFFICE NOTE  Patient ID: [name] MRN: [id number], DOB/AGE: Apr 30, 1921   Date of Visit: 05/21/2013  Primary Physician: Willow Ora, MD Primary Cardiologist / EP: Riley Kill, MD / Graciela Husbands, MD Reason for Visit: Hospital follow-up  History of Present Illness  [name] is an 79 year old man who returns for hospital follow-up. He has CAD s/p CABG in 2007, atrial fibrillation/flutter, tachy-brady syndrome, s/p PPM, diastolic HF and HTN. Last LHC in the setting of non-STEMI 06/2010: Patent LIMA-LAD, patent SVG-DX, patent SVG-OM, patent SVG-PDA, subtotally occluded small marginal branch. Medical therapy recommended. More recently he has been troubled with recurrent falls and orthostasis, aggravated by intravascular volume depletion from diuretic therapy. He was hospitalized 2 weeks ago due to the above and his Lasix dose was decreased. In addition, his PPM was reprogrammed to higher LRL of 75 bpm with sleep mode on (sleep rate at 60 bpm from 11:45 PM - 6:30 AM).  He presents today for hospital followup. Since discharge, he has fallen again in the middle of the night when he got up to go the bathroom. This episode was similar to all of his others. According to Dr. Graciela Husbands, this may be related to sleep mode function as he may require constant rate 75 bpm to increase cardiac output and hopefully lessen his orthostatic hypotensive response. He denies CP, SOB or palpitations. He denies LE swelling, orthopnea or PND.  Past Medical History Past Medical History  Diagnosis Date  . A-fib     paroxysmal, was on coumadin , pt desired to stop it , no longer a candidate per cards  . Tachy-brady syndrome   . CAD (coronary artery disease)     MI angioplasty 61, s/p CABG 01/2006- pacemaker  (12-2006), NSTMI  7-11  . CHF (congestive heart failure)   . Hyperlipidemia   . Hypertension   . Spinal stenosis      has seen Dr Ethelene Hal before, s/p local injection which helped x a while   . GERD  (gastroesophageal reflux disease)   . RLS (restless legs syndrome)     ? of  . Insomnia   . History of BPH     TURP '96 at [facility] 96, urethoplast 97 ( History of bladder outlet obstruction, chronic incontinence)  . Pacemaker   . B12 deficiency anemia   . Heart murmur   . Anginal pain     "not in a long time" (05/14/2013)  . Myocardial infarction 01/1992; 06/2010  . Exertional shortness of breath   . Arthritis     back    Past Surgical History Past Surgical History  Procedure Laterality Date  . Coronary artery bypass graft      "CABG X4" (05/14/2013)  . Cystoscopy with urethral dilatation  1997    "fixed strictures from TURP" (05/14/2013)  . Inguinal hernia repair Right   . Carpal tunnel release Bilateral ~ 2005    Dr Teressa Senter  . Insert / replace / remove pacemaker  12/2006    PPM [employer]  . Tee without cardioversion N/A 03/27/2013    Procedure: TRANSESOPHAGEAL ECHOCARDIOGRAM (TEE);  Surgeon: Lewayne Bunting, MD;  Location: [facility] ENDOSCOPY;  Service: Cardiovascular;  Laterality: N/A;  . Cardioversion N/A 03/27/2013    Procedure: CARDIOVERSION;  Surgeon: Lewayne Bunting, MD;  Location: [facility] ENDOSCOPY;  Service: Cardiovascular;  Laterality: N/A;  . Total knee arthroplasty Bilateral     right 2005, Left 2006.  Dr  Despina Hick  . Coronary angioplasty      "  Dr. Riley Kill"  . Cataract extraction w/ intraocular lens  implant, bilateral Bilateral 2012  . Transurethral resection of prostate  1996    Allergies/Intolerances Allergies  Allergen Reactions  . Amlodipine Other (See Comments)    Feet swelled  . Atorvastatin Other (See Comments)     leg pain   Current Home Medications Current Outpatient Prescriptions  Medication Sig Dispense Refill  . acetaminophen (TYLENOL) 500 MG tablet Take 500 mg by mouth every 8 (eight) hours as needed for pain.      Marland Kitchen aspirin 81 MG tablet Take 81 mg by mouth daily.      Marland Kitchen b complex vitamins tablet Take 1 tablet by mouth daily.      . beta carotene w/minerals  (OCUVITE) tablet Take 1 tablet by mouth daily.      . carvedilol (COREG) 12.5 MG tablet Take 12.5 mg by mouth 2 (two) times daily with a meal.      . doxycycline (VIBRA-TABS) 100 MG tablet Take 1 tablet (100 mg total) by mouth 2 (two) times daily.  14 tablet  0  . furosemide (LASIX) 40 MG tablet Take 1 tablet (40 mg total) by mouth daily.  30 tablet  0  . potassium chloride (K-DUR) 10 MEQ tablet Take 10 mEq by mouth every evening.       No current facility-administered medications for this visit.   Social History History   Social History  . Marital Status: Married    Spouse Name: N/A    Number of Children: N/A  . Years of Education: N/A   Occupational History  . Not on file.   Social History Main Topics  . Smoking status: Former Smoker -- 2.00 packs/day for 15 years    Types: Cigarettes    Quit date: 02/01/1959  . Smokeless tobacco: Never Used  . Alcohol Use: No  . Drug Use: No  . Sexually Active: Not Currently   Other Topics Concern  . Not on file   Social History Narrative  . No narrative on file    Review of Systems General: No chills, fever, night sweats or weight changes Cardiovascular: No chest pain, dyspnea on exertion, edema, orthopnea, palpitations, paroxysmal nocturnal dyspnea Dermatological: No rash, lesions or masses Respiratory: No cough, dyspnea Urologic: No hematuria, dysuria Abdominal: No nausea, vomiting, diarrhea, bright red blood per rectum, melena, or hematemesis Neurologic: No visual changes, weakness, changes in mental status All other systems reviewed and are otherwise negative except as noted above.  Physical Exam Blood pressure 120/67, pulse 74, height 5\' 9"  (1.753 m), weight 203 lb (92.08 kg).  General: Well developed, elderly 79 year old male in no acute distress. HEENT: Normocephalic, atraumatic. EOMs intact. Sclera nonicteric. Oropharynx clear.  Neck: Supple. No JVD. Lungs: Respirations regular and unlabored, CTA bilaterally. No wheezes,  rales or rhonchi. Heart: Regular. S1, S2 present. II/VI systolic murmur. No rub, S3 or S4. Abdomen: Soft, non-distended.   Extremities: No clubbing, cyanosis or edema. PT/Radials 2+ and equal bilaterally. Psych: Normal affect. Neuro: Alert and oriented X 3. Moves all extremities spontaneously.   Diagnostics Orthostatics Lying BP 120/74 HR 78 Sitting BP 125/73 HR 78 Standing 0 minutes BP 125/74 HR 79                2 minutes BP 129/68 HR 76                5 minutes BP 120/67 HR 74  Echocardiogram April 2014 Study Conclusions - Left ventricle: The cavity  size was normal. Wall thickness was increased in a pattern of moderate LVH. Systolic function was normal. The estimated ejection fraction was in the range of 50% to 55%. Wall motion was normal; there were no regional wall motion abnormalities. Left ventricular diastolic function parameters were normal. - Aortic valve: Trileaflet; mildly calcified leaflets. Mobility was  not restricted. Doppler: Transvalvular velocity was within the normal range. There was no stenosis. - Mitral valve: Mild regurgitation. - Left atrium: The atrium was moderately dilated. - Right ventricle: The cavity size was mildly dilated. - Right atrium: The atrium was moderately dilated. - Tricuspid valve: Moderate regurgitation.  Device interrogation today - Normal device function. Thresholds, sensing, impedances consistent with previous measurements. Device programmed to maximize longevity. No mode switch or high ventricular rates noted. Device programmed at appropriate safety margins. Histogram distribution appropriate for patient activity level. Device programmed to optimize intrinsic conduction. Estimated longevity 2 years.  Assessment and Plan 1. Recurrent falls with orthostatic hypotension PPM sleep mode has been turned off At home, home health nurse reports SBP frequently low ~100 Mr. Pelzel is not orthostatic currently but we will try down-titrating  his carvedilol dose to 6.25 mg twice daily Follow-up phone call in 1 week Follow-up office visit with Dr. Graciela Husbands in 6-8 weeks 2. Tachy-brady syndrome s/p PPM implant Normal device function No arrhythmias Sleep mode has been turned off Otherwise no programming changes made Continue remote PPM checks every 3 months 3. PAF Stable Continue current regimen 4. Chronic diastolic HF Stable; Mr. Lampe denies HF symptoms Continue current regimen   Signed, Rick Duff, PA-C 05/21/2013, 5:13 PM

## 2013-05-26 ENCOUNTER — Ambulatory Visit: Payer: Medicare Other | Admitting: Internal Medicine

## 2013-05-29 LAB — PACEMAKER DEVICE OBSERVATION

## 2013-06-03 ENCOUNTER — Telehealth: Payer: Self-pay | Admitting: Cardiovascular Disease

## 2013-06-03 NOTE — Telephone Encounter (Signed)
I spoke with the pt's wife and the pt has not been wearing his compression stockings on a regular basis. They have been watching the salt intake in the diet and will continue to work on decreasing sodium. At this time the pt has been instructed to: 1. Wear compression stockings on a daily basis 2. Take Furosemide and potassium like normal in the morning and then take an extra dose of Furosemide 40mg  and Potassium Chloride 10 mEq at 1:00 tomorrow afternoon, then return to normal doses 3. Weigh daily 4. Call the office with any other questions or concerns  The pt's wife verbalized understanding and will call our on-call service over the weekend with any issues. Dr Excell Seltzer aware of plan and agreed.

## 2013-06-03 NOTE — Telephone Encounter (Signed)
New Problem:    Patient has had a 5 lbs weight gain over 1 week, +2 edema in his legs, lungs are clear and has applied compression hose.  Please call back.

## 2013-06-04 ENCOUNTER — Encounter: Payer: Self-pay | Admitting: Internal Medicine

## 2013-06-07 ENCOUNTER — Telehealth: Payer: Self-pay | Admitting: Physician Assistant

## 2013-06-07 NOTE — Telephone Encounter (Signed)
[name] is a 79 y.o. male CAD, s/p CABG, AFib/Flutter, tachy-brady syndrome, s/p pacemaker, diastolic CHF, HTN and orthostatic hypotension.  He has been taken off anticoagulation due to hx of falls from orthostatic hypotension of late.  His wife called today b/c he got up to go to the BR around 3 am and passed out.  EMS was called.  BP and HR were "ok."  BP reportedly 140/90.  Patient did not want to go to the ED.  He was walking with his walker and fell on a fabric chair.  He scraped his forehead.  Denies HA, CP, SOB.  It sounds like he became orthostatic and had syncope.  I reviewed with is wife that he should get up slowly and pump his calves prior to standing.  He will continue to wear compression stockings.  I advised his wife that he should go to the ED if he has lethargy, HA or blurry vision today.  She agrees.  Will send note to Dr. Colvin Caroli RN.  Will see if he can get in for a sooner appt.  Has an appt in 07/2013.  Tereso Newcomer, PA-C   06/07/2013 10:06 AM

## 2013-06-09 ENCOUNTER — Ambulatory Visit: Payer: Medicare Other | Admitting: Physician Assistant

## 2013-06-09 NOTE — Telephone Encounter (Signed)
I spoke with the pt's wife and she said the pt is doing fine since he passed out. The pt is wearing compression stockings as directed and I reiterated the instruction of getting up slowly and pumping calves prior to standing.  I have scheduled an earlier follow-up appointment with Dr Excell Seltzer on 06/22/13. The pt's wife will call the office with any other questions or concerns.

## 2013-06-22 ENCOUNTER — Ambulatory Visit (INDEPENDENT_AMBULATORY_CARE_PROVIDER_SITE_OTHER): Payer: Medicare Other | Admitting: Cardiovascular Disease

## 2013-06-22 ENCOUNTER — Encounter: Payer: Self-pay | Admitting: Cardiovascular Disease

## 2013-06-22 VITALS — BP 136/80 | HR 75 | Ht 68.0 in | Wt 211.0 lb

## 2013-06-22 DIAGNOSIS — I2510 Atherosclerotic heart disease of native coronary artery without angina pectoris: Secondary | ICD-10-CM

## 2013-06-22 DIAGNOSIS — I951 Orthostatic hypotension: Secondary | ICD-10-CM

## 2013-06-22 DIAGNOSIS — I495 Sick sinus syndrome: Secondary | ICD-10-CM

## 2013-06-22 DIAGNOSIS — I509 Heart failure, unspecified: Secondary | ICD-10-CM

## 2013-06-22 DIAGNOSIS — Z950 Presence of cardiac pacemaker: Secondary | ICD-10-CM

## 2013-06-22 DIAGNOSIS — I5032 Chronic diastolic (congestive) heart failure: Secondary | ICD-10-CM

## 2013-06-22 DIAGNOSIS — R55 Syncope and collapse: Secondary | ICD-10-CM

## 2013-06-22 DIAGNOSIS — I4891 Unspecified atrial fibrillation: Secondary | ICD-10-CM

## 2013-06-22 MED ORDER — CARVEDILOL 6.25 MG PO TABS: 6.2500 mg | ORAL_TABLET | Freq: Two times a day (BID) | ORAL | Status: DC

## 2013-06-22 NOTE — Patient Instructions (Addendum)
Your physician recommends that you schedule a follow-up appointment in: 3 MONTHS with Dr Excell Seltzer  Please increase your Furosemide to 40mg  take two tablets by mouth in the morning and increase Potassium Chloride to take two tablets by mouth daily for the next 3 days and then go back to Furosemide 40mg  daily and Potassium Chloride daily

## 2013-06-22 NOTE — Progress Notes (Signed)
HPI:  79 year old gentleman presenting for followup evaluation. The patient has coronary artery disease status post CABG in 2007. He also has a history of atrial fibrillation/flutter and tachycardia-bradycardia syndrome status post permanent pacemaker. He underwent cardiac catheterization in 2011 when he presented with a non-ST elevation infarction. This demonstrated patency of his LIMA to LAD graft as well as the vein graft to diagonal, obtuse marginal, and PDA. He had subtotal occlusion of a small OM branch and medical therapy was recommended. The patient's most recent problems have been related to orthostatic hypotension and recurrent falls. He was hospitalized in June with syncope. His medications were adjusted. Furosemide was reduced from twice daily to once daily dosing.  The patient complains of leg weakness and gait unsteadiness. He sits most of the day. He and his wife both admit that he is eating way too much. He tries to avoid salty foods. He also drinks a lot of soda. He denies chest pain or pressure, dyspnea, orthopnea, or PND. He's had no recurrence of syncope. He is pacemaker rate was adjusted to 75 beats per minute and he feels this has improved things.  Outpatient Encounter Prescriptions as of 06/22/2013  Medication Sig Dispense Refill  . acetaminophen (TYLENOL) 500 MG tablet Take 500 mg by mouth every 8 (eight) hours as needed for pain.      Marland Kitchen aspirin 81 MG tablet Take 81 mg by mouth daily.      Marland Kitchen b complex vitamins tablet Take 1 tablet by mouth daily.      . beta carotene w/minerals (OCUVITE) tablet Take 1 tablet by mouth daily.      . carvedilol (COREG) 6.25 MG tablet Take 1 tablet (6.25 mg total) by mouth 2 (two) times daily with a meal.  30 tablet  3  . furosemide (LASIX) 40 MG tablet Take 1 tablet (40 mg total) by mouth daily.  30 tablet  0  . potassium chloride (K-DUR) 10 MEQ tablet Take 10 mEq by mouth every evening.      . [DISCONTINUED] doxycycline (VIBRA-TABS) 100 MG  tablet Take 1 tablet (100 mg total) by mouth 2 (two) times daily.  14 tablet  0   No facility-administered encounter medications on file as of 06/22/2013.    Allergies  Allergen Reactions  . Amlodipine Other (See Comments)    Feet swelled  . Atorvastatin Other (See Comments)     leg pain    Past Medical History  Diagnosis Date  . A-fib     paroxysmal, was on coumadin , pt desired to stop it , no longer a candidate per cards  . Tachy-brady syndrome   . CAD (coronary artery disease)     MI angioplasty 62, s/p CABG 01/2006- pacemaker  (12-2006), NSTMI  7-11  . CHF (congestive heart failure)   . Hyperlipidemia   . Hypertension   . Spinal stenosis      has seen Dr Ethelene Hal before, s/p local injection which helped x a while   . GERD (gastroesophageal reflux disease)   . RLS (restless legs syndrome)     ? of  . Insomnia   . History of BPH     TURP '96 at [facility] 96, urethoplast 97 ( History of bladder outlet obstruction, chronic incontinence)  . Pacemaker   . B12 deficiency anemia   . Heart murmur   . Anginal pain     "not in a long time" (05/14/2013)  . Myocardial infarction 01/1992; 06/2010  . Exertional shortness of breath   .  Arthritis     back    ROS: Negative except as per HPI  BP 136/80  Pulse 75  Ht 5\' 8"  (1.727 m)  Wt 211 lb (95.709 kg)  BMI 32.09 kg/m2  PHYSICAL EXAM: Pt is alert and oriented, pleasant elderly male in NAD HEENT: normal Neck: JVP - modestly elevated, carotids 2+= without bruits Lungs: CTA bilaterally CV: RRR with soft systolic ejection murmur at the left sternal border Abd: soft, NT, Positive BS, no hepatomegaly Ext: 1+ bilateral pretibial edema, distal pulses intact and equal Skin: warm/dry no rash  EKG:  AV sequential pacing 75 beats per minute  2D Echo 03/23/2013: Left ventricle: The cavity size was normal. Wall thickness was increased in a pattern of moderate LVH. Systolic function was normal. The estimated ejection fraction was in the range  of 50% to 55%. Wall motion was normal; there were no regional wall motion abnormalities. The transmitral flow pattern was normal. The deceleration time of the early transmitral flow velocity was normal. The pulmonary vein flow pattern was normal. The tissue Doppler parameters were normal. Left ventricular diastolic function parameters were normal.  ------------------------------------------------------------ Aortic valve: Trileaflet; mildly calcified leaflets. Mobility was not restricted. Doppler: Transvalvular velocity was within the normal range. There was no stenosis. No regurgitation.  ------------------------------------------------------------ Aorta: Aortic root: The aortic root was normal in size.  ------------------------------------------------------------ Mitral valve: Structurally normal valve. Mobility was not restricted. Doppler: Transvalvular velocity was within the normal range. There was no evidence for stenosis. Mild regurgitation. Peak gradient: 8mm Hg (D).  ------------------------------------------------------------ Left atrium: The atrium was moderately dilated.  ------------------------------------------------------------ Right ventricle: The cavity size was mildly dilated. Pacer wire or catheter noted in right ventricle. Systolic function was normal.  ------------------------------------------------------------ Pulmonic valve: Doppler: Transvalvular velocity was within the normal range. There was no evidence for stenosis. Trivial regurgitation.  ------------------------------------------------------------ Tricuspid valve: Structurally normal valve. Doppler: Transvalvular velocity was within the normal range. Moderate regurgitation.  ------------------------------------------------------------ Pulmonary artery: Systolic pressure was within the normal range.  ------------------------------------------------------------ Right atrium: The atrium was  moderately dilated. Pacer wire or catheter noted in right atrium.  ------------------------------------------------------------ Pericardium: There was no pericardial effusion.  ASSESSMENT AND PLAN: 1. CAD status post CABG. No ischemic symptoms. Continue current medical therapy.  2. Paroxysmal atrial fibrillation. The patient is AV sequentially paced. He is off of anticoagulation because of hemorrhoidal bleeding and frequent falls. He's been on aspirin. Will continue the same.  3. Recurrent syncope. Has done better on reduced dose of diuretics.  4. Chronic diastolic heart failure. His weight is up and he has more leg edema. I've advised him to increase furosemide to 40 mg twice daily for 3 days. He will also increase K-Dur for this duration. Will resume 40 mg once daily for long-term treatment because of his problems with syncope. Most recent echocardiogram was reviewed that shows normal left ventricular systolic function. He was counseled regarding his need to make major dietary changes. He will continue to weigh himself daily.  For followup, he sees Dr. Graciela Husbands August 19. I will schedule followup appointment in 3 months.  Tonny Bollman 06/22/2013 1:01 PM

## 2013-06-30 ENCOUNTER — Ambulatory Visit (INDEPENDENT_AMBULATORY_CARE_PROVIDER_SITE_OTHER): Payer: Medicare Other | Admitting: Internal Medicine

## 2013-06-30 ENCOUNTER — Encounter: Payer: Self-pay | Admitting: Internal Medicine

## 2013-06-30 VITALS — BP 120/75 | HR 79 | Temp 98.1°F | Wt 205.0 lb

## 2013-06-30 DIAGNOSIS — L03119 Cellulitis of unspecified part of limb: Secondary | ICD-10-CM

## 2013-06-30 DIAGNOSIS — L02419 Cutaneous abscess of limb, unspecified: Secondary | ICD-10-CM

## 2013-06-30 DIAGNOSIS — I5033 Acute on chronic diastolic (congestive) heart failure: Secondary | ICD-10-CM

## 2013-06-30 DIAGNOSIS — Z9181 History of falling: Secondary | ICD-10-CM

## 2013-06-30 DIAGNOSIS — R296 Repeated falls: Secondary | ICD-10-CM

## 2013-06-30 DIAGNOSIS — L03116 Cellulitis of left lower limb: Secondary | ICD-10-CM

## 2013-06-30 NOTE — Progress Notes (Signed)
  Subjective:    Patient ID: [name], male    DOB: 09-21-1921, 79 y.o.   MRN: [id number]  HPI Routine checkup Since the last time he was here, he saw cardiology, Lasix was temporarily increased due to weight gain, he is now back on a regular dose, watching his weight and wt is  stable at home. Follows a very good low-salt diet. Labs reviewed, all stable.  History   Social History  . Marital Status: Married    Spouse Name: N/A    Number of Children: 2  . Years of Education: N/A   Occupational History  . retired     Social History Main Topics  . Smoking status: Former Smoker -- 2.00 packs/day for 15 years    Types: Cigarettes    Quit date: 02/01/1959  . Smokeless tobacco: Never Used  . Alcohol Use: No  . Drug Use: No  . Sexually Active: Not Currently   Other Topics Concern  . Not on file   Social History Narrative   Lives at home  w/ wife    Review of Systems No fever or chills No chest pain or shortness or breath Since the last time he was here ---> no further  syncope or fall. Still has some redness in the left leg, denies any pain    Objective:   Physical Exam BP 120/75  Pulse 79  Temp(Src) 98.1 F (36.7 C) (Oral)  Wt 205 lb (92.987 kg)  BMI 31.18 kg/m2  SpO2 96%  General -- alert, well-developed, NAD   Lungs -- normal respiratory effort, no intercostal retractions, no accessory muscle use, and normal breath sounds.   Heart-- normal rate, regular rhythm, no murmur, and no gallop.   Extremities-- +/+++ pitting edema, symmetric, Continue with L pretibial redness as described last time he was here.  Psych-- in good spirits, not anxious appearing and not depressed appearing.       Assessment & Plan:

## 2013-06-30 NOTE — Assessment & Plan Note (Addendum)
Cellulitis? Status post antibiotics, L leg continued to be slightly red and warm, not getting worse, he is afebrile, white cells normal. I am hesitant to prescribe more antibiotics as he can develop complications (C diff). Recommend observation for now. Will call if the redness increases

## 2013-06-30 NOTE — Patient Instructions (Addendum)

## 2013-06-30 NOTE — Assessment & Plan Note (Addendum)
Seems to be doing well,  Lasix was increased temporarily, lost several pounds. Plan: No change, followup with cardiology as recommended

## 2013-06-30 NOTE — Assessment & Plan Note (Signed)
Fortunately no further falls, prevention discussed

## 2013-07-15 ENCOUNTER — Ambulatory Visit: Payer: Medicare Other | Admitting: Cardiovascular Disease

## 2013-07-21 ENCOUNTER — Encounter: Payer: Self-pay | Admitting: Internal Medicine

## 2013-07-21 ENCOUNTER — Ambulatory Visit (INDEPENDENT_AMBULATORY_CARE_PROVIDER_SITE_OTHER): Payer: Medicare Other | Admitting: Internal Medicine

## 2013-07-21 VITALS — BP 148/75 | HR 81 | Ht 69.0 in | Wt 211.6 lb

## 2013-07-21 DIAGNOSIS — I472 Ventricular tachycardia: Secondary | ICD-10-CM

## 2013-07-21 DIAGNOSIS — Z950 Presence of cardiac pacemaker: Secondary | ICD-10-CM

## 2013-07-21 DIAGNOSIS — R600 Localized edema: Secondary | ICD-10-CM

## 2013-07-21 DIAGNOSIS — I951 Orthostatic hypotension: Secondary | ICD-10-CM

## 2013-07-21 DIAGNOSIS — I5032 Chronic diastolic (congestive) heart failure: Secondary | ICD-10-CM

## 2013-07-21 DIAGNOSIS — I4891 Unspecified atrial fibrillation: Secondary | ICD-10-CM

## 2013-07-21 DIAGNOSIS — R609 Edema, unspecified: Secondary | ICD-10-CM

## 2013-07-21 LAB — PACEMAKER DEVICE OBSERVATION
AL IMPEDENCE PM: 423 Ohm
AL THRESHOLD: 0.75 V
RV LEAD AMPLITUDE: 4 mv
RV LEAD IMPEDENCE PM: 399 Ohm
RV LEAD THRESHOLD: 0.5 V

## 2013-07-21 MED ORDER — FUROSEMIDE 40 MG PO TABS: ORAL_TABLET | ORAL | Status: DC

## 2013-07-21 MED ORDER — POTASSIUM CHLORIDE ER 10 MEQ PO TBCR: EXTENDED_RELEASE_TABLET | ORAL | Status: DC

## 2013-07-21 NOTE — Assessment & Plan Note (Signed)
No intercurrent Ventricular tachycardia  

## 2013-07-21 NOTE — Progress Notes (Signed)
@   3 mins - no sx

## 2013-07-21 NOTE — Progress Notes (Signed)
Patient Care Team: Wanda Plump, MD as PCP - General   HPI  [name] is a 79 y.o. male Seen in followup for a pacemaker implanted for tachybradycardia syndrome. He also has a history of nonsustained ventricular tachycardia  He has a history of coronary artery disease with prior bypass grafting.  He was hospitalized in June for syncope which was attributed to orthostasis. Diuretics were decreased and compression stockings were recommended. Anticoagulation was discontinued.  There is a significant concern about his ability to get around. He describes heaviness in his feet after he walks 100 feet or so. He describes his pain as being full of sand paper. His wife is concerned about his lack of activity. He used to play golf but doesn't dothat at all anymore. His workshop in the basement he doesn't go there much anymore either.  We have reviewed his diet is quite deplete of sodium, but his volume intake is significant.  He was on amiodarone for atrial fibrillation but this was stopped because of leg weakness.  He underwent cardiac catheterization in 2011 when he presented with a non-ST elevation infarction. This demonstrated patency of his LIMA to LAD graft as well as the vein graft to diagonal, obtuse marginal, and PDA. He had subtotal occlusion of a small OM branch and medical therapy was recommended.   Echo showed normalization of LV function   Past Medical History  Diagnosis Date  . A-fib     paroxysmal, was on coumadin , pt desired to stop it , no longer a candidate per cards  . Tachy-brady syndrome   . CAD (coronary artery disease)     MI angioplasty 42, s/p CABG 01/2006- pacemaker  (12-2006), NSTMI  7-11  . CHF (congestive heart failure)   . Hyperlipidemia   . Hypertension   . Spinal stenosis      has seen Dr Ethelene Hal before, s/p local injection which helped x a while   . GERD (gastroesophageal reflux disease)   . RLS (restless legs syndrome)     ? of  . Insomnia   . History  of BPH     TURP '96 at [facility] 96, urethoplast 97 ( History of bladder outlet obstruction, chronic incontinence)  . Pacemaker   . B12 deficiency anemia   . Heart murmur   . Anginal pain     "not in a long time" (05/14/2013)  . Myocardial infarction 01/1992; 06/2010  . Exertional shortness of breath   . Arthritis     back    Past Surgical History  Procedure Laterality Date  . Coronary artery bypass graft      "CABG X4" (05/14/2013)  . Cystoscopy with urethral dilatation  1997    "fixed strictures from TURP" (05/14/2013)  . Inguinal hernia repair Right   . Carpal tunnel release Bilateral ~ 2005    Dr Teressa Senter  . Insert / replace / remove pacemaker  12/2006    PPM [employer]tr[employer]  . Tee without cardioversion N/A 03/27/2013    Procedure: TRANSESOPHAGEAL ECHOCARDIOGRAM (TEE);  Surgeon: Lewayne Bunting, MD;  Location: [facility] ENDOSCOPY;  Service: Cardiovascular;  Laterality: N/A;  . Cardioversion N/A 03/27/2013    Procedure: CARDIOVERSION;  Surgeon: Lewayne Bunting, MD;  Location: [facility] ENDOSCOPY;  Service: Cardiovascular;  Laterality: N/A;  . Total knee arthroplasty Bilateral     right 2005, Left 2006.  Dr  Despina Hick  . Coronary angioplasty      "Dr. Riley Kill"  . Cataract extraction w/ intraocular lens  implant, bilateral Bilateral 2012  . Transurethral resection of prostate  1996    Current Outpatient Prescriptions  Medication Sig Dispense Refill  . acetaminophen (TYLENOL) 500 MG tablet Take 500 mg by mouth every 8 (eight) hours as needed for pain.      Marland Kitchen aspirin 81 MG tablet Take 81 mg by mouth daily.      Marland Kitchen b complex vitamins tablet Take 1 tablet by mouth daily.      . beta carotene w/minerals (OCUVITE) tablet Take 1 tablet by mouth daily.      . carvedilol (COREG) 6.25 MG tablet Take 1 tablet (6.25 mg total) by mouth 2 (two) times daily with a meal.  60 tablet  6  . furosemide (LASIX) 40 MG tablet Take 1 tablet (40 mg total) by mouth daily.  30 tablet  0  . potassium chloride (K-DUR) 10 MEQ tablet Take  10 mEq by mouth every evening.       No current facility-administered medications for this visit.    Allergies  Allergen Reactions  . Amlodipine Other (See Comments)    Feet swelled  . Atorvastatin Other (See Comments)     leg pain    Review of Systems negative except from HPI and PMH  Physical Exam BP 148/75  Pulse 81  Ht 5\' 9"  (1.753 m)  Wt 211 lb 9.6 oz (95.981 kg)  BMI 31.23 kg/m2 Well developed and well nourished in no acute distress HENT normal E scleral and icterus clear Neck Supple JVP flat; carotids brisk and full Clear to ausculation Device pocket well healed; without hematoma or erythema.  There is no tethering  regular rate and rhythm, no murmurs gallops or rub Soft with active bowel sounds No clubbing cyanosis 3+ Edema Alert and oriented, grossly normal motor and sensory function Skin Warm and Dry    Assessment and  Plan

## 2013-07-21 NOTE — Assessment & Plan Note (Signed)
No intercurrent atrial fibrillation since last seen in June. He did have significant atrial fibrillation March and April which may have contributed to congestive heart failure

## 2013-07-21 NOTE — Assessment & Plan Note (Signed)
He is more stable at this time so we will increase hisdiuretic to try and get rid of edema.  i suspect he will be able to ambulate better with less edema.  i have also encouraged him to work on physical therapy

## 2013-07-21 NOTE — Patient Instructions (Signed)
Your physician has recommended you make the following change in your medication:  1) increase lasix to 40 mg one tablet by mouth twice daily 3-4 times a week, all other days take one tablet daily. 2) increase potassium to 10 meq one tablet by mouth twice daily 3-4 times a week, all other days take one tablet daily.  Your physician recommends that you schedule a follow-up appointment in: 2-3 weeks with the nurse for orthostatic blood pressures.  Your physician wants you to follow-up in: 1 year with Dr. Graciela Husbands. You will receive a reminder letter in the mail two months in advance. If you don't receive a letter, please call our office to schedule the follow-up appointment.

## 2013-07-21 NOTE — Assessment & Plan Note (Signed)
There is significant volume overload. We'll try and increase his diuretics from daily to twice daily 3 or 4 days a week. I'm concerned about the potential impact on orthostatic hypotension so we will check that in about 3 weeks. I've also encouraged to decrease his by mouth fluid intake.

## 2013-07-21 NOTE — Progress Notes (Signed)
@   2 mins no sx

## 2013-07-21 NOTE — Assessment & Plan Note (Signed)
The patient's device was interrogated.  The information was reviewed. No changes were made in the programming.    

## 2013-07-21 NOTE — Assessment & Plan Note (Signed)
As above.

## 2013-07-28 ENCOUNTER — Telehealth: Payer: Self-pay | Admitting: Internal Medicine

## 2013-07-28 NOTE — Telephone Encounter (Signed)
New problem   Amber/[facility] has done orthostatic bp on pt.....sitting 118/72, hr 74..standing 114/74 hr 78. Wt 200.2. He is having increase in weakness and not sleeping well.

## 2013-07-28 NOTE — Telephone Encounter (Signed)
Left message for Amber to call.

## 2013-08-05 ENCOUNTER — Telehealth: Payer: Self-pay | Admitting: Internal Medicine

## 2013-08-05 MED ORDER — PRAMIPEXOLE DIHYDROCHLORIDE 0.125 MG PO TABS: 0.1250 mg | ORAL_TABLET | Freq: Every evening | ORAL | Status: DC | PRN

## 2013-08-05 NOTE — Telephone Encounter (Signed)
Caller: [name]/Patient; Phone: [phone]; Reason for Call: Amber, RN with [facility] calling to report pt fell this morning again due to improper use of walker.  Nurse also requesting order for Physical Therapy Re-evaluation.  Also requesting a call back from Dr Drue Novel nurse concerning the patient continuing to fall asleep all the time.  PLEASE F/U WITH NURSE, THANK YOU.

## 2013-08-05 NOTE — Telephone Encounter (Signed)
Left a detailed message on the home phone informing pt wife. Also spoke with [facility] home health nurse and informed her of the new medication.

## 2013-08-05 NOTE — Telephone Encounter (Signed)
Spoke with [facility] who stated that pt complains of RLS keeping him awake at night and cannot sleep. He keeps falling asleep during the day. Nurse states that it has gotten progressively worse. Nurse states that the only change is an increase to his Lasix on 7/23  40mg  daily and 80mg   EOD in addition to regular dosing by Dr. Clide Cliff. Pt is no longer sleeping in the bed instead sleeps in a recliner. Please advise.

## 2013-08-05 NOTE — Telephone Encounter (Signed)
Advise patient, I sent a prescription to Mirapex, take one tablet 2 hours before bedtime ( may increase to 2 tablets if needed) Let me know if that works.

## 2013-08-06 ENCOUNTER — Encounter (HOSPITAL_COMMUNITY): Payer: Self-pay | Admitting: Emergency Medicine

## 2013-08-06 ENCOUNTER — Emergency Department (HOSPITAL_COMMUNITY): Payer: Medicare Other

## 2013-08-06 ENCOUNTER — Emergency Department (HOSPITAL_COMMUNITY)
Admission: EM | Admit: 2013-08-06 | Discharge: 2013-08-06 | Disposition: A | Discharge status: Home / Self Care | Payer: Medicare Other | Attending: Emergency Medicine | Admitting: Emergency Medicine

## 2013-08-06 DIAGNOSIS — R5381 Other malaise: Secondary | ICD-10-CM | POA: Insufficient documentation

## 2013-08-06 DIAGNOSIS — Z7982 Long term (current) use of aspirin: Secondary | ICD-10-CM | POA: Insufficient documentation

## 2013-08-06 DIAGNOSIS — Z79899 Other long term (current) drug therapy: Secondary | ICD-10-CM | POA: Insufficient documentation

## 2013-08-06 DIAGNOSIS — M129 Arthropathy, unspecified: Secondary | ICD-10-CM | POA: Insufficient documentation

## 2013-08-06 DIAGNOSIS — G2581 Restless legs syndrome: Secondary | ICD-10-CM | POA: Insufficient documentation

## 2013-08-06 DIAGNOSIS — Z87891 Personal history of nicotine dependence: Secondary | ICD-10-CM | POA: Insufficient documentation

## 2013-08-06 DIAGNOSIS — I4891 Unspecified atrial fibrillation: Secondary | ICD-10-CM | POA: Insufficient documentation

## 2013-08-06 DIAGNOSIS — Z950 Presence of cardiac pacemaker: Secondary | ICD-10-CM | POA: Insufficient documentation

## 2013-08-06 DIAGNOSIS — I509 Heart failure, unspecified: Secondary | ICD-10-CM | POA: Insufficient documentation

## 2013-08-06 DIAGNOSIS — R011 Cardiac murmur, unspecified: Secondary | ICD-10-CM | POA: Insufficient documentation

## 2013-08-06 DIAGNOSIS — Z862 Personal history of diseases of the blood and blood-forming organs and certain disorders involving the immune mechanism: Secondary | ICD-10-CM | POA: Insufficient documentation

## 2013-08-06 DIAGNOSIS — Z8639 Personal history of other endocrine, nutritional and metabolic disease: Secondary | ICD-10-CM | POA: Insufficient documentation

## 2013-08-06 DIAGNOSIS — I10 Essential (primary) hypertension: Secondary | ICD-10-CM | POA: Insufficient documentation

## 2013-08-06 DIAGNOSIS — Z951 Presence of aortocoronary bypass graft: Secondary | ICD-10-CM | POA: Insufficient documentation

## 2013-08-06 DIAGNOSIS — Z8719 Personal history of other diseases of the digestive system: Secondary | ICD-10-CM | POA: Insufficient documentation

## 2013-08-06 DIAGNOSIS — R531 Weakness: Secondary | ICD-10-CM

## 2013-08-06 DIAGNOSIS — I2510 Atherosclerotic heart disease of native coronary artery without angina pectoris: Secondary | ICD-10-CM | POA: Insufficient documentation

## 2013-08-06 DIAGNOSIS — I252 Old myocardial infarction: Secondary | ICD-10-CM | POA: Insufficient documentation

## 2013-08-06 LAB — URINALYSIS, ROUTINE W REFLEX MICROSCOPIC
Bilirubin Urine: NEGATIVE
Glucose, UA: NEGATIVE mg/dL
Hgb urine dipstick: NEGATIVE
Protein, ur: NEGATIVE mg/dL
Urobilinogen, UA: 0.2 mg/dL (ref 0.0–1.0)

## 2013-08-06 LAB — COMPREHENSIVE METABOLIC PANEL
ALT: 14 U/L (ref 0–53)
AST: 19 U/L (ref 0–37)
Albumin: 3.5 g/dL (ref 3.5–5.2)
CO2: 27 mEq/L (ref 19–32)
Chloride: 104 mEq/L (ref 96–112)
GFR calc non Af Amer: 52 mL/min — ABNORMAL LOW (ref >90)
Potassium: 3.8 mEq/L (ref 3.5–5.1)
Sodium: 139 mEq/L (ref 135–145)
Total Bilirubin: 0.9 mg/dL (ref 0.3–1.2)

## 2013-08-06 LAB — CBC WITH DIFFERENTIAL/PLATELET
Basophils Absolute: 0 10*3/uL (ref 0.0–0.1)
HCT: 34.1 % — ABNORMAL LOW (ref 39.0–52.0)
Lymphocytes Relative: 20 % (ref 12–46)
Neutro Abs: 4.2 10*3/uL (ref 1.7–7.7)
Neutrophils Relative %: 68 % (ref 43–77)
Platelets: 229 10*3/uL (ref 150–400)
RDW: 14.6 % (ref 11.5–15.5)
WBC: 6.2 10*3/uL (ref 4.0–10.5)

## 2013-08-06 MED ORDER — SODIUM CHLORIDE 0.9 % IV SOLN
Freq: Once | INTRAVENOUS | Status: AC
  Administered 2013-08-06: 14:00:00 via INTRAVENOUS

## 2013-08-06 NOTE — ED Provider Notes (Signed)
CSN: [id number]     Arrival date & time 08/06/13  1129 History   First MD Initiated Contact with Patient 08/06/13 1152     Chief Complaint  Patient presents with  . Hypotension   (Consider location/radiation/quality/duration/timing/severity/associated sxs/prior Treatment) HPI Patient presents after a home health nurse found him to have orthostatic hypotension. Patient's of his complaints, states that he feels fine. She does have multiple medical problems, including symptomatic dysrhythmia, with recent placement of pacemaker. Patient has one new medication, Mirapex, started yesterday for restless leg syndrome. Currently he denies pain, lightheadedness, confusion, fevers, chills. His wife concurs that the patient seems well. After home health visit today demonstrated hypotension, possible hypoxia, the patient was referred here for evaluation. Past Medical History  Diagnosis Date  . A-fib     paroxysmal, was on coumadin , pt desired to stop it , no longer a candidate per cards  . Tachy-brady syndrome   . CAD (coronary artery disease)     MI angioplasty 39, s/p CABG 01/2006- pacemaker  (12-2006), NSTMI  7-11  . CHF (congestive heart failure)   . Hyperlipidemia   . Hypertension   . Spinal stenosis      has seen Dr Ethelene Hal before, s/p local injection which helped x a while   . GERD (gastroesophageal reflux disease)   . RLS (restless legs syndrome)     ? of  . Insomnia   . History of BPH     TURP '96 at [facility] 96, urethoplast 97 ( History of bladder outlet obstruction, chronic incontinence)  . Pacemaker   . B12 deficiency anemia   . Heart murmur   . Anginal pain     "not in a long time" (05/14/2013)  . Myocardial infarction 01/1992; 06/2010  . Exertional shortness of breath   . Arthritis     back   Past Surgical History  Procedure Laterality Date  . Coronary artery bypass graft      "CABG X4" (05/14/2013)  . Cystoscopy with urethral dilatation  1997    "fixed strictures from TURP"  (05/14/2013)  . Inguinal hernia repair Right   . Carpal tunnel release Bilateral ~ 2005    Dr Teressa Senter  . Insert / replace / remove pacemaker  12/2006    PPM [employer]  . Tee without cardioversion N/A 03/27/2013    Procedure: TRANSESOPHAGEAL ECHOCARDIOGRAM (TEE);  Surgeon: Lewayne Bunting, MD;  Location: [facility] ENDOSCOPY;  Service: Cardiovascular;  Laterality: N/A;  . Cardioversion N/A 03/27/2013    Procedure: CARDIOVERSION;  Surgeon: Lewayne Bunting, MD;  Location: [facility] ENDOSCOPY;  Service: Cardiovascular;  Laterality: N/A;  . Total knee arthroplasty Bilateral     right 2005, Left 2006.  Dr  Despina Hick  . Coronary angioplasty      "Dr. Riley Kill"  . Cataract extraction w/ intraocular lens  implant, bilateral Bilateral 2012  . Transurethral resection of prostate  1996   Family History  Problem Relation Age of Onset  . Heart disease Neg Hx   . Diabetes Neg Hx    History  Substance Use Topics  . Smoking status: Former Smoker -- 2.00 packs/day for 15 years    Types: Cigarettes    Quit date: 02/01/1959  . Smokeless tobacco: Never Used  . Alcohol Use: No    Review of Systems  Constitutional:       Per HPI, otherwise negative  HENT:       Per HPI, otherwise negative  Respiratory:       Per HPI,  otherwise negative  Cardiovascular:       Per HPI, otherwise negative  Gastrointestinal: Negative for vomiting.  Endocrine:       Negative aside from HPI  Genitourinary:       Neg aside from HPI   Musculoskeletal:       Per HPI, otherwise negative  Skin: Negative.   Neurological: Negative for syncope.    Allergies  Amlodipine and Atorvastatin  Home Medications   Current Outpatient Rx  Name  Route  Sig  Dispense  Refill  . acetaminophen (TYLENOL) 500 MG tablet   Oral   Take 500 mg by mouth every 8 (eight) hours as needed for pain.         Marland Kitchen aspirin EC 81 MG tablet   Oral   Take 81 mg by mouth daily.         . B Complex-C (B-COMPLEX WITH VITAMIN C) tablet   Oral   Take 1 tablet  by mouth daily.         . beta carotene w/minerals (OCUVITE) tablet   Oral   Take 1 tablet by mouth daily.         . carvedilol (COREG) 6.25 MG tablet   Oral   Take 1 tablet (6.25 mg total) by mouth 2 (two) times daily with a meal.   60 tablet   6   . furosemide (LASIX) 40 MG tablet   Oral   Take 40 mg by mouth 2 (two) times daily as needed for fluid. Takes 1-2 times daily, varies.         . potassium chloride (K-DUR,KLOR-CON) 10 MEQ tablet   Oral   Take 10 mEq by mouth 2 (two) times daily as needed (taken with Lasix). Takes 1-2 times daily, varies.         . pramipexole (MIRAPEX) 0.125 MG tablet   Oral   Take 0.125-0.25 mg by mouth at bedtime as needed (for restless leg).          BP 118/71  Pulse 77  Temp(Src) 98 F (36.7 C) (Oral)  Resp 16  SpO2 98% Physical Exam  Nursing note and vitals reviewed. Constitutional: He is oriented to person, place, and time. He appears well-developed. No distress.  HENT:  Head: Normocephalic and atraumatic.  Eyes: Conjunctivae and EOM are normal.  Cardiovascular: Normal rate and regular rhythm.   Pulmonary/Chest: Effort normal. No stridor. No respiratory distress.  Pacemaker in place, no erythema, no swelling  Abdominal: He exhibits no distension.  Musculoskeletal: He exhibits no edema and no tenderness.  Neurological: He is alert and oriented to person, place, and time.  Skin: Skin is warm and dry.  Psychiatric: He has a normal mood and affect.    ED Course  Procedures (including critical care time) Labs Review Labs Reviewed  CBC WITH DIFFERENTIAL - Abnormal; Notable for the following:    RBC 3.86 (*)    Hemoglobin 11.3 (*)    HCT 34.1 (*)    All other components within normal limits  COMPREHENSIVE METABOLIC PANEL - Abnormal; Notable for the following:    Glucose, Bld 101 (*)    GFR calc non Af Amer 52 (*)    GFR calc Af Amer 61 (*)    All other components within normal limits  URINALYSIS, ROUTINE W REFLEX  MICROSCOPIC  CG4 I-STAT (LACTIC ACID)   Imaging Review Dg Chest 2 View  08/06/2013   *RADIOLOGY REPORT*  Clinical Data: Chest discomfort; recent trauma  CHEST -  2 VIEW  Comparison: March 24, 2013  Findings: There is no edema or consolidation.  Heart is mildly enlarged with normal pulmonary vascularity.  Pacemaker leads are attached to the right atrium and right ventricle.  The patient is status post coronary artery bypass grafting.  No adenopathy.  No pneumothorax.  There is degenerative change in the thoracic spine.  IMPRESSION: No edema or consolidation. Mild cardiac enlargement.   Original Report Authenticated By: Bretta Bang, M.D.   Cardiac monitor 76-based abnormal Cardiac 90% room air normal   I reviewed the patient's chart after the initial evaluation.  3:20 PM Patient appears well on repeat exam.  Vital signs remained stable.  Labs essentially unremarkable.  MDM  No diagnosis found. Patient presents after home health visit raised concern of possible hypotension.  Notably, the patient has no complaints, is afebrile, hemodynamic stable here.  With reassuring labs, x-ray, there is low suspicion for occult infection.  With no new medication, there is low suspicion for consistent hypo-tension.  After a period of monitoring in the emergency department the patient was appropriate for discharge with close outpatient followup.    Gerhard Munch, MD 08/06/13 [phone]

## 2013-08-06 NOTE — ED Notes (Signed)
x1 no answer 

## 2013-08-06 NOTE — ED Notes (Signed)
Pt from home, home health stated BP was lower than normal, hearing diminished lower lobe lung sounds, and a decrease in BP with standing.. Pt and wife also state that pt has been falling a lot lately. Larey Seat last week out the chair eating breakfast and yesterday pt fell out of chair and later on that day pt states he blacked out and fell. Pt denies any pain at the moment or any other symptoms.

## 2013-08-11 ENCOUNTER — Encounter: Payer: Self-pay | Admitting: Internal Medicine

## 2013-08-11 ENCOUNTER — Ambulatory Visit (INDEPENDENT_AMBULATORY_CARE_PROVIDER_SITE_OTHER): Payer: Medicare Other | Admitting: Internal Medicine

## 2013-08-11 VITALS — BP 110/70 | HR 77 | Temp 98.7°F | Wt 205.2 lb

## 2013-08-11 DIAGNOSIS — I951 Orthostatic hypotension: Secondary | ICD-10-CM

## 2013-08-11 DIAGNOSIS — G2581 Restless legs syndrome: Secondary | ICD-10-CM

## 2013-08-11 DIAGNOSIS — R296 Repeated falls: Secondary | ICD-10-CM

## 2013-08-11 DIAGNOSIS — I5032 Chronic diastolic (congestive) heart failure: Secondary | ICD-10-CM

## 2013-08-11 DIAGNOSIS — Z9181 History of falling: Secondary | ICD-10-CM

## 2013-08-11 NOTE — Assessment & Plan Note (Signed)
physical therapy visit him qd, encouraged to un clutter his house to prevent falls.

## 2013-08-11 NOTE — Progress Notes (Signed)
  Subjective:    Patient ID: [name], male    DOB: 15-Feb-1921, 79 y.o.   MRN: [id number]  HPI Routine office visit Since the last time  he was here, he saw cardiology, note reviewed. Taking a extra tablet of Lasix to 4 times a week. RLS, he requested Mirapex few days ago, symptoms have definitely improved. Went to the ER 08/06/2013 with a question of orthostatic hypotension, he was asymptomatic, chart reviewed  labs were normal. Chest x-ray showed no edema.  Past Medical History  Diagnosis Date  . A-fib     paroxysmal, was on coumadin , pt desired to stop it , no longer a candidate per cards  . Tachy-brady syndrome   . CAD (coronary artery disease)     MI angioplasty 10, s/p CABG 01/2006- pacemaker  (12-2006), NSTMI  7-11  . CHF (congestive heart failure)   . Hyperlipidemia   . Hypertension   . Spinal stenosis      has seen Dr Ethelene Hal before, s/p local injection which helped x a while   . GERD (gastroesophageal reflux disease)   . RLS (restless legs syndrome)     ? of  . Insomnia   . History of BPH     TURP '96 at [facility] 96, urethoplast 97 ( History of bladder outlet obstruction, chronic incontinence)  . Pacemaker   . B12 deficiency anemia   . Heart murmur   . Anginal pain     "not in a long time" (05/14/2013)  . Myocardial infarction 01/1992; 06/2010  . Exertional shortness of breath   . Arthritis     back   Past Surgical History  Procedure Laterality Date  . Coronary artery bypass graft      "CABG X4" (05/14/2013)  . Cystoscopy with urethral dilatation  1997    "fixed strictures from TURP" (05/14/2013)  . Inguinal hernia repair Right   . Carpal tunnel release Bilateral ~ 2005    Dr Teressa Senter  . Insert / replace / remove pacemaker  12/2006    PPM [employer]  . Tee without cardioversion N/A 03/27/2013    Procedure: TRANSESOPHAGEAL ECHOCARDIOGRAM (TEE);  Surgeon: Lewayne Bunting, MD;  Location: [facility] ENDOSCOPY;  Service: Cardiovascular;  Laterality: N/A;  . Cardioversion N/A  03/27/2013    Procedure: CARDIOVERSION;  Surgeon: Lewayne Bunting, MD;  Location: [facility] ENDOSCOPY;  Service: Cardiovascular;  Laterality: N/A;  . Total knee arthroplasty Bilateral     right 2005, Left 2006.  Dr  Despina Hick  . Coronary angioplasty      "Dr. Riley Kill"  . Cataract extraction w/ intraocular lens  implant, bilateral Bilateral 2012  . Transurethral resection of prostate  1996     Review of Systems Lower extremity edema well-controlled per patient. Had a mechanical fall yesterday, has physical therapy at home daily.     Objective:   Physical Exam BP 110/70  Pulse 77  Temp(Src) 98.7 F (37.1 C)  Wt 205 lb 3.2 oz (93.078 kg)  BMI 30.29 kg/m2  SpO2 97%   General -- alert, well-developed, NAD    Lungs -- normal respiratory effort, no intercostal retractions, no accessory muscle use, and normal breath sounds.    Heart-- normal rate, regular rhythm, no murmur, and no gallop.   Extremities-- +/+++ pitting edema, symmetric  Psych-- Cognition and judgment appear intact. Alert and cooperative with normal attention span and concentration. not anxious appearing and not depressed appearing.       Assessment & Plan:

## 2013-08-11 NOTE — Assessment & Plan Note (Signed)
Seems stable with extra dose of Lasix 3 to 4 times a week, recent BMP normal, recommend no change. See instructions.

## 2013-08-11 NOTE — Assessment & Plan Note (Signed)
Went to the ER few days ago, labs normal, he is asymptomatic. Recommend observation

## 2013-08-11 NOTE — Assessment & Plan Note (Signed)
Just  started Mirapex, very good results. No change.

## 2013-08-11 NOTE — Patient Instructions (Signed)
Continue taking the same medications. Weight yourself daily, if you gain more than 4 or 5 pounds call me or cardiology Next visit in 6 weeks

## 2013-08-19 ENCOUNTER — Ambulatory Visit (INDEPENDENT_AMBULATORY_CARE_PROVIDER_SITE_OTHER): Payer: Medicare Other | Admitting: Nurse Practitioner

## 2013-08-19 VITALS — BP 140/100 | HR 77 | Resp 18

## 2013-08-19 DIAGNOSIS — I951 Orthostatic hypotension: Secondary | ICD-10-CM

## 2013-08-19 NOTE — Progress Notes (Signed)
Patient presents ambulatory from lobby with cane and with assistance from his wife.  Patient denies dizziness or light headedness.  Patient is alert and oriented to person, place, time; skin is warm dry and acyanotic.  Patient questioned why he was here.  I explained to patient that Dr. Graciela Husbands wants orthostatic blood pressures checked in the office by a nurse so that we can ensure that they are done accurately.  Patient was assisted onto an exam table and made comfortable so that he could lay for 10 minutes before starting.  Patient tolerated procedure well and BP/HR readings were: Lying:     HR 77 BP 140/100 Sitting:   HR 75 BP 144/104 Standing @ 0 min:  HR 78 BP 144/88 Standing @ 2 min:  HR 75 BP 150/98 Standing @ 5 min:  HR 74 BP 144/98  Patient asymptomatic during assessment of orthostatic blood pressure measurements. Results phoned to Dory Horn, RN and Dr. Graciela Husbands in [place].  I reported assessment of patient's bilateral lower extremities per Dr. Odessa Fleming request. Dr. Graciela Husbands advises patient continue current medications and f/u with one of the extenders within the next few weeks.  Appointment scheduled for 9/30 @ 10:30 with Rick Duff, PA-C.  Patient escorted to discharge in no acute distress.

## 2013-09-01 ENCOUNTER — Encounter: Payer: Self-pay | Admitting: Internal Medicine

## 2013-09-01 ENCOUNTER — Other Ambulatory Visit: Payer: Self-pay

## 2013-09-01 ENCOUNTER — Ambulatory Visit (INDEPENDENT_AMBULATORY_CARE_PROVIDER_SITE_OTHER): Payer: Medicare Other | Admitting: Cardiology

## 2013-09-01 ENCOUNTER — Encounter: Payer: Self-pay | Admitting: Cardiology

## 2013-09-01 VITALS — BP 128/73 | HR 79 | Ht 69.0 in | Wt 212.0 lb

## 2013-09-01 DIAGNOSIS — M7989 Other specified soft tissue disorders: Secondary | ICD-10-CM

## 2013-09-01 DIAGNOSIS — Z950 Presence of cardiac pacemaker: Secondary | ICD-10-CM

## 2013-09-01 DIAGNOSIS — I951 Orthostatic hypotension: Secondary | ICD-10-CM

## 2013-09-01 DIAGNOSIS — I4891 Unspecified atrial fibrillation: Secondary | ICD-10-CM

## 2013-09-01 DIAGNOSIS — I495 Sick sinus syndrome: Secondary | ICD-10-CM

## 2013-09-01 LAB — PACEMAKER DEVICE OBSERVATION
AL IMPEDENCE PM: 437 Ohm
AL THRESHOLD: 0.5 V
ATRIAL PACING PM: 86.7
BAMS-0001: 175 {beats}/min
BATTERY VOLTAGE: 2.71 V
RV LEAD AMPLITUDE: 4 mv

## 2013-09-01 NOTE — Progress Notes (Signed)
ELECTROPHYSIOLOGY OFFICE NOTE  Patient ID: [name] MRN: [id number], DOB/AGE: 06-11-1921   Date of Visit: 09/01/2013  Primary Physician: Willow Ora, MD Primary Cardiologist: Excell Seltzer, MD / Graciela Husbands, MD Reason for Visit: Follow-up LE edema  History of Present Illness  [name] is a 79 y.o. male with CAD s/p CABG in 2007, atrial fibrillation/flutter, tachy-brady syndrome s/p PPM, preserved LV function, HTN and recurrent falls.   Last LHC in the setting of non-STEMI 06/2010: patent LIMA-LAD, patent SVG-DX, patent SVG-OM, patent SVG-PDA, subtotally occluded small marginal branch. Medical therapy recommended.   More recently he has been troubled with recurrent falls and orthostatic hypotension, aggravated by intravascular volume depletion from diuretic therapy. He was hospitalized due to the above and his Lasix dose was decreased. In August this year, he was reevaluated by Dr. Graciela Husbands and had 3+ LE edema. His Lasix dose was up-titrated.   Since last being seen in our clinic, he reports he is doing well and has no complaints other than persistent LE swelling. However, this is much improved from his last visit with Dr. Graciela Husbands in August. Compression stockings help. He is accompanied by his wife who agrees. He denies chest pain or shortness of breath. He denies palpitations, dizziness, near syncope or syncope. He denies orthopnea, PND or recent weight gain. He is compliant with medications.  Past Medical History Past Medical History  Diagnosis Date  . A-fib     paroxysmal, was on coumadin , pt desired to stop it , no longer a candidate per cards  . Tachy-brady syndrome   . CAD (coronary artery disease)     MI angioplasty 7, s/p CABG 01/2006- pacemaker  (12-2006), NSTMI  7-11  . CHF (congestive heart failure)   . Hyperlipidemia   . Hypertension   . Spinal stenosis      has seen Dr Ethelene Hal before, s/p local injection which helped x a while   . GERD (gastroesophageal reflux disease)   . RLS  (restless legs syndrome)     ? of  . Insomnia   . History of BPH     TURP '96 at [facility] 96, urethoplast 97 ( History of bladder outlet obstruction, chronic incontinence)  . Pacemaker   . B12 deficiency anemia   . Heart murmur   . Anginal pain     "not in a long time" (05/14/2013)  . Myocardial infarction 01/1992; 06/2010  . Exertional shortness of breath   . Arthritis     back    Past Surgical History Past Surgical History  Procedure Laterality Date  . Coronary artery bypass graft      "CABG X4" (05/14/2013)  . Cystoscopy with urethral dilatation  1997    "fixed strictures from TURP" (05/14/2013)  . Inguinal hernia repair Right   . Carpal tunnel release Bilateral ~ 2005    Dr Teressa Senter  . Insert / replace / remove pacemaker  12/2006    PPM [employer]tr[employer]  . Tee without cardioversion N/A 03/27/2013    Procedure: TRANSESOPHAGEAL ECHOCARDIOGRAM (TEE);  Surgeon: Lewayne Bunting, MD;  Location: [facility] ENDOSCOPY;  Service: Cardiovascular;  Laterality: N/A;  . Cardioversion N/A 03/27/2013    Procedure: CARDIOVERSION;  Surgeon: Lewayne Bunting, MD;  Location: [facility] ENDOSCOPY;  Service: Cardiovascular;  Laterality: N/A;  . Total knee arthroplasty Bilateral     right 2005, Left 2006.  Dr  Despina Hick  . Coronary angioplasty      "Dr. Riley Kill"  . Cataract extraction w/ intraocular lens  implant,  bilateral Bilateral 2012  . Transurethral resection of prostate  1996    Allergies/Intolerances Allergies  Allergen Reactions  . Amlodipine Other (See Comments)    Feet swelled  . Atorvastatin Other (See Comments)     leg pain    Current Home Medications Current Outpatient Prescriptions  Medication Sig Dispense Refill  . acetaminophen (TYLENOL) 500 MG tablet Take 500 mg by mouth every 8 (eight) hours as needed for pain.      Marland Kitchen aspirin EC 81 MG tablet Take 81 mg by mouth daily.      . B Complex-C (B-COMPLEX WITH VITAMIN C) tablet Take 1 tablet by mouth daily.      . beta carotene w/minerals (OCUVITE) tablet Take  1 tablet by mouth daily.      . carvedilol (COREG) 6.25 MG tablet Take 1 tablet (6.25 mg total) by mouth 2 (two) times daily with a meal.  60 tablet  6  . furosemide (LASIX) 40 MG tablet Take 40 mg by mouth. 1 tablet daily, but 3 times a week takes 2 tablets      . potassium chloride (K-DUR,KLOR-CON) 10 MEQ tablet Take 10 mEq by mouth 2 (two) times daily as needed (taken with Lasix). Takes 1-2 times daily, varies.      . pramipexole (MIRAPEX) 0.125 MG tablet Take 0.125-0.25 mg by mouth at bedtime as needed (for restless leg).       No current facility-administered medications for this visit.    Social History History   Social History  . Marital Status: Married    Spouse Name: N/A    Number of Children: 2  . Years of Education: N/A   Occupational History  . retired     Social History Main Topics  . Smoking status: Former Smoker -- 2.00 packs/day for 15 years    Types: Cigarettes    Quit date: 02/01/1959  . Smokeless tobacco: Never Used  . Alcohol Use: No  . Drug Use: No  . Sexual Activity: Not Currently   Other Topics Concern  . Not on file   Social History Narrative   Lives at home  w/ wife     Review of Systems General: No chills, fever, night sweats or weight changes Cardiovascular: No chest pain, dyspnea on exertion, edema, orthopnea, palpitations, paroxysmal nocturnal dyspnea Dermatological: No rash, lesions or masses Respiratory: No cough, dyspnea Urologic: No hematuria, dysuria Abdominal: No nausea, vomiting, diarrhea, bright red blood per rectum, melena, or hematemesis Neurologic: No visual changes, weakness, changes in mental status All other systems reviewed and are otherwise negative except as noted above.  Physical Exam Vitals: Blood pressure 128/73, pulse 79, height 5\' 9"  (1.753 m), weight 212 lb (96.163 kg).  General: Well developed, well appearing 79 y.o. male in no acute distress. HEENT: Normocephalic, atraumatic. EOMs intact. Sclera nonicteric.  Oropharynx clear.  Neck: Supple without bruits. No JVD. Lungs: Respirations regular and unlabored, CTA bilaterally. No wheezes, rales or rhonchi. Heart: RRR. S1, S2 present. No murmurs, rub, S3 or S4. Abdomen: Soft, non-tender, non-distended. BS present x 4 quadrants. No hepatosplenomegaly.  Extremities: No clubbing, cyanosis or edema. DP/PT/Radials 2+ and equal bilaterally. Psych: Normal affect. Neuro: Alert and oriented X 3. Moves all extremities spontaneously.   Diagnostics Echocardiogram April 2014  Study Conclusions - Left ventricle: The cavity size was normal. Wall thickness was increased in a pattern of moderate LVH. Systolic function was normal. The estimated ejection fraction was in the range of 50% to 55%. Wall motion was normal;  there were no regional wall motion abnormalities. Left ventricular diastolic function parameters were normal.  - Aortic valve: Trileaflet; mildly calcified leaflets. Mobility was  not restricted. Doppler: Transvalvular velocity was within the  normal range. There was no stenosis. - Mitral valve: Mild regurgitation. - Left atrium: The atrium was moderately dilated. - Right ventricle: The cavity size was mildly dilated. - Right atrium: The atrium was moderately dilated. - Tricuspid valve: Moderate regurgitation. Orthostatics 08/19/2013 Lying: HR 77 BP 140/100  Sitting: HR 75 BP 144/104  Standing @ 0 min: HR 78 BP 144/88  Standing @ 2 min: HR 75 BP 150/98  Standing @ 5 min: HR 74 BP 144/98  Device interrogation today - Normal device function. Thresholds, sensing, impedances consistent with previous measurements. Device programmed to maximize longevity. No atrial high rate episodes. No high ventricular rates noted. Device programmed at appropriate safety margins. Histogram distribution appropriate for patient activity level. Device programmed to optimize intrinsic conduction. Estimated longevity 2 years.   Assessment and Plan 1. LE swelling Recent  echo shows normal LV function Improved with increased Lasix dosing, compression stockings and elevation Follow-up with Dr. Drue Novel 2. Recurrent falls with orthostatic hypotension  Mr. Biglow is not orthostatic currently  No syncope  Encouraged continued physical therapy 2. Tachy-brady syndrome s/p PPM implant  Normal device function  No arrhythmias  Continue remote PPM checks every 3 months  Follow-up with Dr. Graciela Husbands as scheduled in one year  3. PAF  Stable  Continue current regimen  Signed, Magally Vahle, PA-C 09/01/2013, 11:13 AM

## 2013-09-01 NOTE — Patient Instructions (Signed)
Your physician recommends that you continue on your current medications as directed. Please refer to the Current Medication list given to you today.  Keep your upcoming appointments

## 2013-09-10 ENCOUNTER — Telehealth: Payer: Self-pay | Admitting: Internal Medicine

## 2013-09-10 ENCOUNTER — Other Ambulatory Visit: Payer: Self-pay | Admitting: *Deleted

## 2013-09-10 ENCOUNTER — Ambulatory Visit (HOSPITAL_COMMUNITY): Payer: Medicare Other | Attending: Internal Medicine

## 2013-09-10 DIAGNOSIS — M7989 Other specified soft tissue disorders: Secondary | ICD-10-CM | POA: Insufficient documentation

## 2013-09-10 DIAGNOSIS — I2510 Atherosclerotic heart disease of native coronary artery without angina pectoris: Secondary | ICD-10-CM | POA: Insufficient documentation

## 2013-09-10 DIAGNOSIS — M79609 Pain in unspecified limb: Secondary | ICD-10-CM

## 2013-09-10 DIAGNOSIS — M25562 Pain in left knee: Secondary | ICD-10-CM

## 2013-09-10 DIAGNOSIS — I10 Essential (primary) hypertension: Secondary | ICD-10-CM | POA: Insufficient documentation

## 2013-09-10 DIAGNOSIS — R609 Edema, unspecified: Secondary | ICD-10-CM

## 2013-09-10 DIAGNOSIS — E785 Hyperlipidemia, unspecified: Secondary | ICD-10-CM | POA: Insufficient documentation

## 2013-09-10 NOTE — Telephone Encounter (Signed)
Pt going to get venous doppler this afternoon @ [facility].

## 2013-09-10 NOTE — Telephone Encounter (Signed)
Unfortunately, I can't see him this afternoon. Please call the patient,  her needs an ultrasound , I just put the order in for it, please be sure that happened today. Arrange office visit for  tomorrow afternoon.

## 2013-09-10 NOTE — Telephone Encounter (Signed)
Called and spoke with patient. appt was made for tomorrow

## 2013-09-10 NOTE — Telephone Encounter (Signed)
Received call from [facility], RN for [facility]. She stated that patient's left leg has increased swelling and redness compared to last week. She said that patient informed her that since Sunday he has been experiencing pain in upper left thigh when he is up walking. She is concerned that he may have a possible DVT. Please advise.

## 2013-09-10 NOTE — Telephone Encounter (Signed)
Preliminary report negative, advise patient no DVT

## 2013-09-11 ENCOUNTER — Encounter: Payer: Self-pay | Admitting: Internal Medicine

## 2013-09-11 ENCOUNTER — Ambulatory Visit (INDEPENDENT_AMBULATORY_CARE_PROVIDER_SITE_OTHER): Payer: Medicare Other | Admitting: Internal Medicine

## 2013-09-11 VITALS — BP 122/70 | HR 74 | Temp 98.2°F | Wt 206.2 lb

## 2013-09-11 DIAGNOSIS — G2581 Restless legs syndrome: Secondary | ICD-10-CM

## 2013-09-11 DIAGNOSIS — Z23 Encounter for immunization: Secondary | ICD-10-CM

## 2013-09-11 DIAGNOSIS — R600 Localized edema: Secondary | ICD-10-CM

## 2013-09-11 DIAGNOSIS — R609 Edema, unspecified: Secondary | ICD-10-CM

## 2013-09-11 NOTE — Progress Notes (Signed)
Subjective:    Patient ID: [name], male    DOB: 06-04-1921, 79 y.o.   MRN: [id number]  HPI Acute visit Home health nurse called yesterday, she was concerned about a left leg DVT. The patient reports the swelling of the left leg was slightly worse yesterday, today looks better. He has a persisting left pretibial area redness which is unchanged from baseline. What is new is for the last 10 days has developed pain at that posterior left thigh, mostly at night, sharp, almost like a cramp Also would like his ear is to be checked for wax.  Past Medical History  Diagnosis Date  . A-fib     paroxysmal, was on coumadin , pt desired to stop it , no longer a candidate per cards  . Tachy-brady syndrome   . CAD (coronary artery disease)     MI angioplasty 30, s/p CABG 01/2006- pacemaker  (12-2006), NSTMI  7-11  . CHF (congestive heart failure)   . Hyperlipidemia   . Hypertension   . Spinal stenosis      has seen Dr Ethelene Hal before, s/p local injection which helped x a while   . GERD (gastroesophageal reflux disease)   . RLS (restless legs syndrome)     ? of  . Insomnia   . History of BPH     TURP '96 at [facility] 96, urethoplast 97 ( History of bladder outlet obstruction, chronic incontinence)  . Pacemaker   . B12 deficiency anemia   . Heart murmur   . Anginal pain     "not in a long time" (05/14/2013)  . Myocardial infarction 01/1992; 06/2010  . Exertional shortness of breath   . Arthritis     back   Past Surgical History  Procedure Laterality Date  . Coronary artery bypass graft      "CABG X4" (05/14/2013)  . Cystoscopy with urethral dilatation  1997    "fixed strictures from TURP" (05/14/2013)  . Inguinal hernia repair Right   . Carpal tunnel release Bilateral ~ 2005    Dr Teressa Senter  . Insert / replace / remove pacemaker  12/2006    PPM [employer]  . Tee without cardioversion N/A 03/27/2013    Procedure: TRANSESOPHAGEAL ECHOCARDIOGRAM (TEE);  Surgeon: Lewayne Bunting, MD;  Location:  [facility] ENDOSCOPY;  Service: Cardiovascular;  Laterality: N/A;  . Cardioversion N/A 03/27/2013    Procedure: CARDIOVERSION;  Surgeon: Lewayne Bunting, MD;  Location: [facility] ENDOSCOPY;  Service: Cardiovascular;  Laterality: N/A;  . Total knee arthroplasty Bilateral     right 2005, Left 2006.  Dr  Despina Hick  . Coronary angioplasty      "Dr. Riley Kill"  . Cataract extraction w/ intraocular lens  implant, bilateral Bilateral 2012  . Transurethral resection of prostate  1996   History   Social History  . Marital Status: Married    Spouse Name: N/A    Number of Children: 2  . Years of Education: N/A   Occupational History  . retired     Social History Main Topics  . Smoking status: Former Smoker -- 2.00 packs/day for 15 years    Types: Cigarettes    Quit date: 02/01/1959  . Smokeless tobacco: Never Used  . Alcohol Use: No  . Drug Use: No  . Sexual Activity: Not Currently   Other Topics Concern  . Not on file   Social History Narrative   Lives at home  w/ wife, wife drives      Review of Systems  Denies fever or chills No recent falls Taking Mirapex mostly one tablet at night, has helped tremendously with RLS and sleep in general.     Objective:   Physical Exam BP 122/70  Pulse 74  Temp(Src) 98.2 F (36.8 C)  Wt 206 lb 3.2 oz (93.532 kg)  BMI 30.44 kg/m2  SpO2 76% General -- alert, well-developed, NAD.  Ears-- modate amount of wax noted, tympanic membranes normal Extremities--  Edema left pretibial redness seems at baseline. Palpation of the left thigh showed no  redness, swelling, mass. Thighs are symmetric. No rash. Trochanteric bursas are not tender.  Psych-- Cognition and judgment appear intact. Cooperative with normal attention span and concentration. No anxious appearing , no depressed appearing.      Assessment & Plan:    Cerumen impaction, most of the wax removed with a spoon, recommend peroxide as needed   Today , I spent more than 25 min with the patient, >50% of  the time counseling, and  reviewing the chart

## 2013-09-11 NOTE — Assessment & Plan Note (Signed)
Getting very good relief with Mirapex, usually takes one tablet at night.

## 2013-09-11 NOTE — Assessment & Plan Note (Addendum)
See last phone note and history of present illness, HH nurse was concerned about a DVT. Ultrasound of the left leg showed no DVT. Edema on chronic left pretibial redness at baseline, doubt cellulitis. He has developed a new problem which is the left thigh pain described almost like a cramp for 10 days, could be dystonia related to Mirapex. The patient is getting great relief with Mirapex for his RLS consequently I don't like to discontinue it unless we have to. Recommend a warm compress, Tylenol and observation.If not better will consider maybe a low dose of Flexeril but I'm hesitant as may cause drowsiness.

## 2013-09-11 NOTE — Patient Instructions (Addendum)
Get a warm compress to the left leg at night Before  bed time take Tylenol 500 mg 2 tablets Call if the pain persists in the next few days, call anytime if the pain is severe

## 2013-09-11 NOTE — Telephone Encounter (Signed)
Informed patient that doppler preliminary report was negative and no DVT

## 2013-09-12 ENCOUNTER — Encounter: Payer: Self-pay | Admitting: Internal Medicine

## 2013-09-14 ENCOUNTER — Other Ambulatory Visit: Payer: Self-pay | Admitting: *Deleted

## 2013-09-14 MED ORDER — POTASSIUM CHLORIDE ER 10 MEQ PO CPCR: ORAL_CAPSULE | ORAL | Status: DC

## 2013-09-18 ENCOUNTER — Ambulatory Visit: Payer: Medicare Other | Admitting: Internal Medicine

## 2013-09-23 ENCOUNTER — Ambulatory Visit: Payer: Medicare Other | Admitting: Cardiovascular Disease

## 2013-09-25 ENCOUNTER — Encounter: Payer: Self-pay | Admitting: Cardiovascular Disease

## 2013-09-25 NOTE — Telephone Encounter (Signed)
New Problem: ° °[facility] states they are waiting on the Doctor to sign some new orders and they want to know the status. Please advise °

## 2013-09-30 ENCOUNTER — Ambulatory Visit (INDEPENDENT_AMBULATORY_CARE_PROVIDER_SITE_OTHER): Payer: Medicare Other | Admitting: Cardiovascular Disease

## 2013-09-30 ENCOUNTER — Encounter (INDEPENDENT_AMBULATORY_CARE_PROVIDER_SITE_OTHER): Payer: Self-pay

## 2013-09-30 ENCOUNTER — Encounter: Payer: Self-pay | Admitting: Cardiovascular Disease

## 2013-09-30 VITALS — BP 116/80 | HR 80 | Ht 69.0 in | Wt 211.0 lb

## 2013-09-30 DIAGNOSIS — I2510 Atherosclerotic heart disease of native coronary artery without angina pectoris: Secondary | ICD-10-CM

## 2013-09-30 NOTE — Progress Notes (Signed)
HPI:  79 year old gentleman presenting for followup evaluation. The patient has coronary artery disease status post CABG in 2007. He also has a history of atrial fibrillation/flutter and tachycardia-bradycardia syndrome status post permanent pacemaker. He underwent cardiac catheterization in 2011 when he presented with a non-ST elevation infarction. This demonstrated patency of his LIMA to LAD graft as well as the vein graft to diagonal, obtuse marginal, and PDA. He had subtotal occlusion of a small OM branch and medical therapy was recommended. The patient was hospitalized earlier this year with syncope. Medication adjustments were made at that time. He's also had chronic leg swelling requiring maintenance diuretic therapy.  The patient is doing fairly well. His major complaint is lack of energy. He has leg swelling but this is improved from his baseline. He denies chest pain, chest pressure, or shortness of breath. He does have a history of syncope but no recent episodes. He denies postural symptoms.  Outpatient Encounter Prescriptions as of 09/30/2013  Medication Sig Dispense Refill  . acetaminophen (TYLENOL) 500 MG tablet Take 500 mg by mouth every 8 (eight) hours as needed for pain.      Marland Kitchen aspirin EC 81 MG tablet Take 81 mg by mouth daily.      . B Complex-C (B-COMPLEX WITH VITAMIN C) tablet Take 1 tablet by mouth daily.      . beta carotene w/minerals (OCUVITE) tablet Take 1 tablet by mouth daily.      . carvedilol (COREG) 6.25 MG tablet Take 1 tablet (6.25 mg total) by mouth 2 (two) times daily with a meal.  60 tablet  6  . furosemide (LASIX) 40 MG tablet Take 40 mg by mouth. 1 tablet daily, but 3 times a week takes 2 tablets      . potassium chloride (K-DUR,KLOR-CON) 10 MEQ tablet Take 10 mEq by mouth 2 (two) times daily as needed (taken with Lasix). Takes 1-2 times daily, varies.      . pramipexole (MIRAPEX) 0.125 MG tablet Take 0.125-0.25 mg by mouth at bedtime as needed (for restless  leg).      . [DISCONTINUED] potassium chloride (MICRO-K) 10 MEQ CR capsule take one tablet by mouth twice daily 3-4 times a week, all other days take one capsule daily  30 capsule  1   No facility-administered encounter medications on file as of 09/30/2013.    Allergies  Allergen Reactions  . Amlodipine Other (See Comments)    Feet swelled  . Atorvastatin Other (See Comments)     leg pain    Past Medical History  Diagnosis Date  . A-fib     paroxysmal, was on coumadin , pt desired to stop it , no longer a candidate per cards  . Tachy-brady syndrome   . CAD (coronary artery disease)     MI angioplasty 80, s/p CABG 01/2006- pacemaker  (12-2006), NSTMI  7-11  . CHF (congestive heart failure)   . Hyperlipidemia   . Hypertension   . Spinal stenosis      has seen Dr Ethelene Hal before, s/p local injection which helped x a while   . GERD (gastroesophageal reflux disease)   . RLS (restless legs syndrome)     ? of  . Insomnia   . History of BPH     TURP '96 at [facility] 96, urethoplast 97 ( History of bladder outlet obstruction, chronic incontinence)  . Pacemaker   . B12 deficiency anemia   . Heart murmur   . Anginal pain     "  not in a long time" (05/14/2013)  . Myocardial infarction 01/1992; 06/2010  . Exertional shortness of breath   . Arthritis     back    ROS: Negative except as per HPI  BP 116/80  Pulse 80  Ht 5\' 9"  (1.753 m)  Wt 211 lb (95.709 kg)  BMI 31.15 kg/m2  PHYSICAL EXAM: Pt is alert and oriented, pleasant elderly male in NAD HEENT: normal Neck: JVP - normal, carotids 2+= without bruits Lungs: CTA bilaterally CV: RRR with grade 2/6 systolic ejection murmur at the upper sternal border Abd: soft, NT, Positive BS, no hepatomegaly Ext: 2+ pretibial edema bilaterally, distal pulses intact and equal Skin: warm/dry no rash  ASSESSMENT AND PLAN: 1. Coronary artery disease status post CABG. Stable without angina. Continue aspirin for antiplatelet therapy and carvedilol. He  is statin intolerant. Followup in 6 months.  2. Paroxysmal atrial fibrillation. The patient is off of anticoagulation because of recurrent falls.  3. Chronic leg edema. Essentially unchanged. He will continue on his current diuretic regimen and he will use support stockings as tolerated  4. Recurrent syncope. Seems improved over the last few months after medication adjustments were made.  For followup I will see him back in 6 months.  Tonny Bollman 09/30/2013 2:14 PM

## 2013-09-30 NOTE — Patient Instructions (Signed)
Your physician wants you to follow-up in: 6 MONTHS with Dr Cooper.  You will receive a reminder letter in the mail two months in advance. If you don't receive a letter, please call our office to schedule the follow-up appointment.  Your physician recommends that you continue on your current medications as directed. Please refer to the Current Medication list given to you today.  

## 2013-10-01 NOTE — Telephone Encounter (Signed)
This encounter was created in error - please disregard.

## 2013-10-26 ENCOUNTER — Ambulatory Visit (INDEPENDENT_AMBULATORY_CARE_PROVIDER_SITE_OTHER): Payer: Medicare Other | Admitting: *Deleted

## 2013-10-26 DIAGNOSIS — I472 Ventricular tachycardia, unspecified: Secondary | ICD-10-CM

## 2013-10-26 DIAGNOSIS — I4891 Unspecified atrial fibrillation: Secondary | ICD-10-CM

## 2013-11-02 LAB — MDC_IDC_ENUM_SESS_TYPE_REMOTE
Battery Impedance: 1963 Ohm
Battery Voltage: 2.7 V
Brady Statistic AP VP Percent: 89 %
Brady Statistic AP VS Percent: 0 %
Brady Statistic AS VP Percent: 11 %
Brady Statistic AS VS Percent: 0 %
Lead Channel Impedance Value: 444 Ohm
Lead Channel Pacing Threshold Amplitude: 0.625 V
Lead Channel Pacing Threshold Pulse Width: 0.4 ms
Lead Channel Pacing Threshold Pulse Width: 0.4 ms
Lead Channel Setting Pacing Amplitude: 2 V
Lead Channel Setting Pacing Pulse Width: 0.4 ms

## 2013-11-03 ENCOUNTER — Telehealth: Payer: Self-pay | Admitting: *Deleted

## 2013-11-03 ENCOUNTER — Other Ambulatory Visit: Payer: Self-pay | Admitting: *Deleted

## 2013-11-03 MED ORDER — PRAMIPEXOLE DIHYDROCHLORIDE 0.125 MG PO TABS: 0.1250 mg | ORAL_TABLET | Freq: Every evening | ORAL | Status: DC | PRN

## 2013-11-03 NOTE — Telephone Encounter (Signed)
10/28/2013 Received Home Health Certification and Plan of Care by fax from [facility].  Billing form attached, sent to [facility]. 11/03/2013 Faxed completed forms to [facility], sent copy to batch, sent copy to billing.  bw

## 2013-11-03 NOTE — Telephone Encounter (Signed)
11/03/2013  Pt came by office requesting refill on pramipexole (MIRAPEX) 0.125 MG tablet.    Please call in to [facility].  Thank you.  bw

## 2013-11-03 NOTE — Telephone Encounter (Signed)
Mirapex refill sent to [facility]

## 2013-11-11 ENCOUNTER — Encounter: Payer: Self-pay | Admitting: *Deleted

## 2013-11-16 ENCOUNTER — Encounter: Payer: Self-pay | Admitting: Internal Medicine

## 2013-12-08 ENCOUNTER — Ambulatory Visit (INDEPENDENT_AMBULATORY_CARE_PROVIDER_SITE_OTHER): Payer: Medicare Other | Admitting: Internal Medicine

## 2013-12-08 ENCOUNTER — Encounter: Payer: Self-pay | Admitting: Internal Medicine

## 2013-12-08 VITALS — BP 136/84 | HR 82 | Temp 97.6°F | Wt 227.0 lb

## 2013-12-08 DIAGNOSIS — R600 Localized edema: Secondary | ICD-10-CM

## 2013-12-08 DIAGNOSIS — R609 Edema, unspecified: Secondary | ICD-10-CM

## 2013-12-08 NOTE — Progress Notes (Signed)
Pre visit review using our clinic review tool, if applicable. No additional management support is needed unless otherwise documented below in the visit note. 

## 2013-12-08 NOTE — Patient Instructions (Signed)
Get your blood work before you leave  Next visit is for routine check up  in 3 weeks  No need to come back fasting   Take Lasix 40 mg one tablet daily   On Mondays Wednesdays and Fridays take 2 tablets Weight yourself daily, call me with your weight readings in one week   Elevated your legs at least 1 hour at midmorning and one hour at mid afternoon

## 2013-12-08 NOTE — Assessment & Plan Note (Addendum)
Pt's main concern today is oozing from the lower extremities, see history of present illness. I notice significant increase in weight, He denies increase in any respiratory symptoms. Plan: Continue following a low salt diet Leg elevation BMP Increase Lasix temporarily (currently taking only one a day), see instructions Recheck in 3 weeks Watch for signs of cellulitis such as fever, purulent discharge from the legs, tenderness etc.

## 2013-12-08 NOTE — Progress Notes (Signed)
Subjective:    Patient ID: [name], male    DOB: Mar 07, 1921, 80 y.o.   MRN: [id number]  HPI Today we discussed the following issues: Patient's main concern is increased lower extremity edema, that has been a gradual process over the last few weeks, today he is even more concerned because he is oozing some clear fluid from the back of the left leg. He takes his medications regularly including Lasix daily, rarely takes a double dose of Lasix because it makes him urinate too much. He does follow a low-salt diet although the wife reports he tends to over eat sometimes  Past Medical History  Diagnosis Date  . A-fib     paroxysmal, was on coumadin , pt desired to stop it , no longer a candidate per cards  . Tachy-brady syndrome   . CAD (coronary artery disease)     MI angioplasty 78, s/p CABG 01/2006- pacemaker  (12-2006), NSTMI  7-11  . CHF (congestive heart failure)   . Hyperlipidemia   . Hypertension   . Spinal stenosis      has seen Dr Ethelene Hal before, s/p local injection which helped x a while   . GERD (gastroesophageal reflux disease)   . RLS (restless legs syndrome)     ? of  . Insomnia   . History of BPH     TURP '96 at [facility] 96, urethoplast 97 ( History of bladder outlet obstruction, chronic incontinence)  . Pacemaker   . B12 deficiency anemia   . Heart murmur   . Anginal pain     "not in a long time" (05/14/2013)  . Myocardial infarction 01/1992; 06/2010  . Exertional shortness of breath   . Arthritis     back   Past Surgical History  Procedure Laterality Date  . Coronary artery bypass graft      "CABG X4" (05/14/2013)  . Cystoscopy with urethral dilatation  1997    "fixed strictures from TURP" (05/14/2013)  . Inguinal hernia repair Right   . Carpal tunnel release Bilateral ~ 2005    Dr Teressa Senter  . Insert / replace / remove pacemaker  12/2006    PPM [employer]  . Tee without cardioversion N/A 03/27/2013    Procedure: TRANSESOPHAGEAL ECHOCARDIOGRAM (TEE);  Surgeon:  Lewayne Bunting, MD;  Location: [facility] ENDOSCOPY;  Service: Cardiovascular;  Laterality: N/A;  . Cardioversion N/A 03/27/2013    Procedure: CARDIOVERSION;  Surgeon: Lewayne Bunting, MD;  Location: [facility] ENDOSCOPY;  Service: Cardiovascular;  Laterality: N/A;  . Total knee arthroplasty Bilateral     right 2005, Left 2006.  Dr  Despina Hick  . Coronary angioplasty      "Dr. Riley Kill"  . Cataract extraction w/ intraocular lens  implant, bilateral Bilateral 2012  . Transurethral resection of prostate  1996     Review of Systems Denies any fever or chills No chest pain, able to all his activities of daily living as usual,  no increased shortness of breath. He is sleeping in the recliner, has been doing that for months.     Objective:   Physical Exam BP 136/84  Pulse 82  Temp(Src) 97.6 F (36.4 C)  Wt 227 lb (102.967 kg)  SpO2 98%  General -- alert, well-developed, NAD.  Lungs -- normal respiratory effort, no intercostal retractions, no accessory muscle use, and normal breath sounds.  Heart-- normal rate, regular rhythm, no murmur.   Extremities--  +++/+++ pitting edema L>R. Pretibial area is erythematous on the left, at baseline,  not tender or particularly warm. He has several superficial excoriations, the ones at the posterior aspect of the leg oozing clear fluid. Neurologic--  alert & oriented X3. Speech normal, gait normal, strength normal in all extremities.  Psych-- Cognition and judgment appear intact. Cooperative with normal attention span and concentration. No anxious or depressed appearing.     Assessment & Plan:

## 2013-12-09 LAB — BASIC METABOLIC PANEL
BUN: 22 mg/dL (ref 6–23)
CHLORIDE: 107 meq/L (ref 96–112)
CO2: 28 meq/L (ref 19–32)
Calcium: 8.8 mg/dL (ref 8.4–10.5)
Creatinine, Ser: 1.2 mg/dL (ref 0.4–1.5)
GFR: 60.45 mL/min (ref >60.00)
Glucose, Bld: 90 mg/dL (ref 70–99)
Potassium: 4.6 mEq/L (ref 3.5–5.1)
SODIUM: 140 meq/L (ref 135–145)

## 2013-12-15 ENCOUNTER — Telehealth: Payer: Self-pay | Admitting: *Deleted

## 2013-12-15 DIAGNOSIS — S81809A Unspecified open wound, unspecified lower leg, initial encounter: Secondary | ICD-10-CM

## 2013-12-15 NOTE — Telephone Encounter (Signed)
He is doing great with weight loss. As far as antibiotics, if he is having fever, chills, redness or discharge from the wound that looks like pus then antibiotics are appropriate but the last time I saw him few days ago it didn't seem like he needed antibiotics. Ask wife to call me in one week with patient's weight.

## 2013-12-15 NOTE — Telephone Encounter (Signed)
Patient wife called and stated that the patient weight has when from 214 to 209. Wife also states that he has open lesions on his leg and would like to know if she could get an antibiotic for his leg. Please advise. SW

## 2013-12-16 NOTE — Telephone Encounter (Signed)
See answer below from [facility]. [name], try another home health agency  ----- "unfortunately, this pt was discharged from our services on 09/22/13 and would now be considered a new referral. [facility] plan that I can't accept from community referral sources right now due to staffing.  Sorry we can't help this pt this time. "

## 2013-12-16 NOTE — Telephone Encounter (Signed)
Unable to request [facility] care team from [facility]. See staff message.

## 2013-12-16 NOTE — Telephone Encounter (Signed)
Spoke with pt's wife. Denies fever / chills or pus coming from wounds. She states that he's still having pain / liquid discharge and difficulties elevating legs do to couple raw spots in the back of his leg. Pt wants to know if theres anything he can do for the pain and discharge. Please advise.

## 2013-12-16 NOTE — Telephone Encounter (Signed)
Tylenol as needed for pain, use pillows to help w/ pain when he elevates the legs. the drainage will decrease once swelling improves Please request [facility] to send the [facility] care team  to the patient's house.

## 2013-12-16 NOTE — Addendum Note (Signed)
Addended by: Eustace QuailEABOLD, Jaidence Geisler J on: 12/16/2013 01:42 PM   Modules accepted: Orders

## 2013-12-17 ENCOUNTER — Other Ambulatory Visit: Payer: Self-pay | Admitting: Internal Medicine

## 2013-12-17 NOTE — Telephone Encounter (Signed)
Referral sent to [facility]      KP

## 2013-12-18 ENCOUNTER — Telehealth: Payer: Self-pay | Admitting: *Deleted

## 2013-12-18 NOTE — Telephone Encounter (Signed)
[name] from [facility] called and stated that patient is out of network and will not be able to be seen by them. [facility] and they agreed to start treating patient Monday or Tuesday of next week. JG//CMA

## 2013-12-21 ENCOUNTER — Other Ambulatory Visit: Payer: Self-pay | Admitting: Internal Medicine

## 2013-12-24 ENCOUNTER — Telehealth: Payer: Self-pay | Admitting: *Deleted

## 2013-12-24 NOTE — Telephone Encounter (Signed)
pts wife called regarding referral for wound care, states that pt never received a phone call or received treatment. [facility] , states that they never received referral even though Shanda BumpsJessica confirmed with them on 12/18/13 (phone note). re faxed referral to [facility] will f/u to make sure they received referral.

## 2014-01-05 ENCOUNTER — Encounter: Payer: Self-pay | Admitting: Internal Medicine

## 2014-01-05 ENCOUNTER — Ambulatory Visit (INDEPENDENT_AMBULATORY_CARE_PROVIDER_SITE_OTHER): Payer: Medicare Other | Admitting: Internal Medicine

## 2014-01-05 VITALS — BP 98/63 | HR 78 | Temp 97.9°F | Wt 220.0 lb

## 2014-01-05 DIAGNOSIS — N400 Enlarged prostate without lower urinary tract symptoms: Secondary | ICD-10-CM

## 2014-01-05 DIAGNOSIS — I10 Essential (primary) hypertension: Secondary | ICD-10-CM

## 2014-01-05 DIAGNOSIS — I5032 Chronic diastolic (congestive) heart failure: Secondary | ICD-10-CM

## 2014-01-05 DIAGNOSIS — R609 Edema, unspecified: Secondary | ICD-10-CM

## 2014-01-05 DIAGNOSIS — R600 Localized edema: Secondary | ICD-10-CM

## 2014-01-05 MED ORDER — TRIAMCINOLONE ACETONIDE 0.1 % EX LOTN: 1.0000 "application " | TOPICAL_LOTION | Freq: Two times a day (BID) | CUTANEOUS | Status: DC

## 2014-01-05 MED ORDER — HYDROCHLOROTHIAZIDE 12.5 MG PO TABS: 12.5000 mg | ORAL_TABLET | Freq: Every day | ORAL | Status: DC

## 2014-01-05 NOTE — Patient Instructions (Signed)
Get your blood work before you leave   Next visit is for routine check up in 2-3 weeks   Continue the same medications and add hydrochlorothiazide every morning

## 2014-01-05 NOTE — Progress Notes (Signed)
Subjective:    Patient ID: [name], male    DOB: [birth year], 80 y.o.   MRN: [id number]  HPI Acute visit Here because he is having itchiness and a rash in the upper extremities distal from the elbow for several days. He is taking the same medications, the only thing new is that he is getting his left leg wrapped with zinc by the home health agency. On further review of the chart, he has decreased his weight from 227 pounds to 220 taking Lasix 40 mg 2 tablets a day. His BP is noted to be low today however at home is checked by the home health agency and is around 120/88. Also complains of difficulty urinating, this is going on for many years but is gradually getting worse.   Past Medical History  Diagnosis Date  . A-fib     paroxysmal, was on coumadin , pt desired to stop it , no longer a candidate per cards  . Tachy-brady syndrome   . CAD (coronary artery disease)     MI angioplasty 67, s/p CABG 01/2006- pacemaker  (12-2006), NSTMI  7-11  . CHF (congestive heart failure)   . Hyperlipidemia   . Hypertension   . Spinal stenosis      has seen Dr Ethelene Hal before, s/p local injection which helped x a while   . GERD (gastroesophageal reflux disease)   . RLS (restless legs syndrome)     ? of  . Insomnia   . History of BPH     TURP '96 at [facility] 96, urethoplast 97 ( History of bladder outlet obstruction, chronic incontinence)  . Pacemaker   . B12 deficiency anemia   . Heart murmur   . Anginal pain     "not in a long time" (05/14/2013)  . Myocardial infarction 01/1992; 06/2010  . Exertional shortness of breath   . Arthritis     back   Past Surgical History  Procedure Laterality Date  . Coronary artery bypass graft      "CABG X4" (05/14/2013)  . Cystoscopy with urethral dilatation  1997    "fixed strictures from TURP" (05/14/2013)  . Inguinal hernia repair Right   . Carpal tunnel release Bilateral ~ 2005    Dr Teressa Senter  . Insert / replace / remove pacemaker  12/2006    PPM  [employer]  . Tee without cardioversion N/A 03/27/2013    Procedure: TRANSESOPHAGEAL ECHOCARDIOGRAM (TEE);  Surgeon: Lewayne Bunting, MD;  Location: [facility] ENDOSCOPY;  Service: Cardiovascular;  Laterality: N/A;  . Cardioversion N/A 03/27/2013    Procedure: CARDIOVERSION;  Surgeon: Lewayne Bunting, MD;  Location: [facility] ENDOSCOPY;  Service: Cardiovascular;  Laterality: N/A;  . Total knee arthroplasty Bilateral     right 2005, Left 2006.  Dr  Despina Hick  . Coronary angioplasty      "Dr. Riley Kill"  . Cataract extraction w/ intraocular lens  implant, bilateral Bilateral 2012  . Transurethral resection of prostate  1996     Review of Systems Denies chest pain, breathing is at baseline, no orthopnea. No fever, chills, dysuria or gross hematuria. Some urinary frequency. Denies suprapubic pain.     Objective:   Physical Exam BP 98/63  Pulse 78  Temp(Src) 97.9 F (36.6 C)  Wt 220 lb (99.791 kg)  SpO2 98% General -- alert, well-developed, elderly gentleman in no apparent distress.  Lungs -- normal respiratory effort, no intercostal retractions, no accessory muscle use, and normal breath sounds.  Heart-- normal rate, regular rhythm,  no murmur.  Extremities--  Upper extremities, he has a rash from the elbows down, it is patchy, erythematous, dry and slightly scaly. Very few superficial ulcers. Right leg with mild edema, slightly erythematous. Left leg: leg is wrapped, has some edema worse than the right, slightly red, slightly tender, he has a small excoriation at the calf; leg looks similar to previous exams Neurologic--  alert & oriented X3.   Psych--   No anxious or depressed appearing.      Assessment & Plan:  Rash, upper extremities, unclear etiology, trial with Kenalog lotion

## 2014-01-05 NOTE — Assessment & Plan Note (Signed)
BP low today however patient reports he is very good at home around 120/80, it is checked by the home health assistant

## 2014-01-05 NOTE — Assessment & Plan Note (Addendum)
Weight in October was 211 pounds, early in January was 227, Lasix was increased and today is 220. On Lasix 40 mg 2 tablets daily Plan: BMP Refer to cardiology, he is losing weight, but is far from his previous 211 pounds. Low dose of HCTZ 12.5 mg noting his BP to be low today but usually better at home

## 2014-01-05 NOTE — Progress Notes (Signed)
Pre visit review using our clinic review tool, if applicable. No additional management support is needed unless otherwise documented below in the visit note. 

## 2014-01-05 NOTE — Assessment & Plan Note (Signed)
Edema seems to be at baseline, room for improvement, see CHF comments: we are adding HCTZ and referr  to cardiology. Continue with LE Wrapping

## 2014-01-05 NOTE — Assessment & Plan Note (Addendum)
ong history of prostate problems, difficulty urinating getting slightly worse, we'll ask urology to see. [facility]

## 2014-01-06 ENCOUNTER — Telehealth: Payer: Self-pay | Admitting: Internal Medicine

## 2014-01-06 LAB — BASIC METABOLIC PANEL
BUN: 24 mg/dL — AB (ref 6–23)
CHLORIDE: 105 meq/L (ref 96–112)
CO2: 26 mEq/L (ref 19–32)
Calcium: 9 mg/dL (ref 8.4–10.5)
Creatinine, Ser: 1.2 mg/dL (ref 0.4–1.5)
GFR: 61.03 mL/min (ref >60.00)
GLUCOSE: 91 mg/dL (ref 70–99)
POTASSIUM: 4.6 meq/L (ref 3.5–5.1)
Sodium: 140 mEq/L (ref 135–145)

## 2014-01-06 NOTE — Telephone Encounter (Signed)
Relevant patient education assigned to patient using Emmi. ° °

## 2014-01-08 ENCOUNTER — Telehealth: Payer: Self-pay | Admitting: *Deleted

## 2014-01-08 MED ORDER — FLUTICASONE PROPIONATE 0.05 % EX LOTN: TOPICAL_LOTION | CUTANEOUS | Status: DC

## 2014-01-08 NOTE — Telephone Encounter (Signed)
Harvie Heckandy, home health nurse, from [facility], called and stated that patient would like to have a different cream for the rash on his arm because Kenalog is too expensive. Patient also believes that the dressing that is used for his left lower leg is causing a rash. Harvie HeckRandy would like to know if this can be changed as well. Please advise.  CB# [phone]

## 2014-01-08 NOTE — Telephone Encounter (Signed)
LM for CB from home health nurse.

## 2014-01-08 NOTE — Telephone Encounter (Addendum)
Change to fluticasone lotion--- rx sent  Continue with  Legs wrapping,  use instead White petroleum jelly

## 2014-01-08 NOTE — Telephone Encounter (Signed)
Gave verbal to nurse. Faxed letter/order to office at [phone]--Attn Gentry Rochebbie Simpson and Malachi BondsGloria

## 2014-01-08 NOTE — Telephone Encounter (Signed)
Nurse returned call. Left detailed message on vm and to cb to confirm order received.

## 2014-01-11 ENCOUNTER — Encounter: Payer: Self-pay | Admitting: Physician Assistant

## 2014-01-11 ENCOUNTER — Ambulatory Visit (INDEPENDENT_AMBULATORY_CARE_PROVIDER_SITE_OTHER): Payer: Medicare Other | Admitting: Physician Assistant

## 2014-01-11 VITALS — BP 122/70 | HR 79 | Ht 67.0 in | Wt 210.0 lb

## 2014-01-11 DIAGNOSIS — I951 Orthostatic hypotension: Secondary | ICD-10-CM

## 2014-01-11 DIAGNOSIS — I4891 Unspecified atrial fibrillation: Secondary | ICD-10-CM

## 2014-01-11 DIAGNOSIS — I10 Essential (primary) hypertension: Secondary | ICD-10-CM

## 2014-01-11 DIAGNOSIS — R0602 Shortness of breath: Secondary | ICD-10-CM

## 2014-01-11 DIAGNOSIS — I5032 Chronic diastolic (congestive) heart failure: Secondary | ICD-10-CM

## 2014-01-11 DIAGNOSIS — I2510 Atherosclerotic heart disease of native coronary artery without angina pectoris: Secondary | ICD-10-CM

## 2014-01-11 DIAGNOSIS — R609 Edema, unspecified: Secondary | ICD-10-CM

## 2014-01-11 LAB — BASIC METABOLIC PANEL
BUN: 27 mg/dL — ABNORMAL HIGH (ref 6–23)
CALCIUM: 8.8 mg/dL (ref 8.4–10.5)
CO2: 25 meq/L (ref 19–32)
CREATININE: 1.3 mg/dL (ref 0.4–1.5)
Chloride: 105 mEq/L (ref 96–112)
GFR: 56.61 mL/min — ABNORMAL LOW (ref >60.00)
GLUCOSE: 95 mg/dL (ref 70–99)
Potassium: 4.3 mEq/L (ref 3.5–5.1)
SODIUM: 140 meq/L (ref 135–145)

## 2014-01-11 LAB — BRAIN NATRIURETIC PEPTIDE: Pro B Natriuretic peptide (BNP): 146 pg/mL — ABNORMAL HIGH (ref 0.0–100.0)

## 2014-01-11 NOTE — Patient Instructions (Signed)
LAB WORK TODAY; BMET, BNP  PER SCOTT WEAVER, PAC ONCE YOU ARE DONE WRAPPING YOUR LEGS HE WOULD LIKE FOR YOU TO GET SOME OTC COMPRESSION STOCKINGS  Your physician recommends that you schedule a follow-up appointment in: April 2015; WE WILL SEND OUT A REMINDER LETTER TO MAKE YOUR APPT

## 2014-01-11 NOTE — Progress Notes (Signed)
[address] [place], [place]  [address] Phone: [phone] Fax:  [phone]  Date:  01/11/2014   ID:  [name], DOB 1920-12-16, MRN [id number]  PCP:  Willow Ora, MD  Cardiologist:  Dr. Tonny Bollman   Electrophysiologist:  Dr. [name]    History of Present Illness: [name] is a 80 y.o. male with a hx of CAD, s/p CABG in 2007, atrial fibrillation/flutter, tachybradycardia syndrome, s/p pacemaker, diastolic CHF, HTN, HL, bladder outlet obstruction 2/2 to urethral stricture from TURP many years ago, orthostatic hypotension with syncope and recurrent falls.  Last cath in 06/2010 in setting of NSTEMI demonstrated patent grafts and subtotal occlusion of marginal branch treated medically.  He is not on anticoagulation due to hemorrhoidal bleeding and frequent falls (Xarelto stopped in 2014).   Last seen by Dr. Excell Seltzer 09/2013. Recently seen by primary care with volume excess (weight up 16 lbs).  His furosemide was adjusted.  He was seen last week in follow up. His weight was marginally improved. He was placed on HCTZ in addition to his Lasix.    Patient notes increased edema over several months.  He does not feel that his edema is any worse recently.  Weight at home was 189 6-8 mos ago.  He had gotten up to 216 recently but tells me his weight has come down to 210 today (220 by our scales).  He denies any significant change in his breathing.  He is NYHA 2b.  Denies orthopnea, PND.  No chest pain or syncope.  His L leg is always bigger than his right.  He had a wound on his post L leg recently and has been getting it wrapped with home health.    Recent Labs: 02/03/2013: TSH 3.25  03/23/2013: Pro B Natriuretic peptide (BNP) 2160.0*  08/06/2013: ALT 14; Hemoglobin 11.3*  01/05/2014: Creatinine 1.2; Potassium 4.6   Wt Readings from Last 3 Encounters:  01/11/14 210 lb (95.255 kg)  01/05/14 220 lb (99.791 kg)  12/08/13 227 lb (102.967 kg)     Past Medical History    Diagnosis Date  . A-fib     paroxysmal, was on coumadin , pt desired to stop it , no longer a candidate per cards  . Tachy-brady syndrome   . CAD (coronary artery disease)     MI angioplasty 56, s/p CABG 01/2006- pacemaker  (12-2006), NSTMI  7-11  . CHF (congestive heart failure)   . Hyperlipidemia   . Hypertension   . Spinal stenosis      has seen Dr Ethelene Hal before, s/p local injection which helped x a while   . GERD (gastroesophageal reflux disease)   . RLS (restless legs syndrome)     ? of  . Insomnia   . History of BPH     TURP '96 at [facility] 96, urethoplast 97 ( History of bladder outlet obstruction, chronic incontinence)  . Pacemaker   . B12 deficiency anemia   . Heart murmur   . Anginal pain     "not in a long time" (05/14/2013)  . Myocardial infarction 01/1992; 06/2010  . Exertional shortness of breath   . Arthritis     back    Current Outpatient Prescriptions  Medication Sig Dispense Refill  . acetaminophen (TYLENOL) 500 MG tablet Take 500 mg by mouth every 8 (eight) hours as needed for pain.      Marland Kitchen aspirin EC 81 MG tablet Take 81 mg by mouth daily.      Marland Kitchen  B Complex-C (B-COMPLEX WITH VITAMIN C) tablet Take 1 tablet by mouth daily.      . beta carotene w/minerals (OCUVITE) tablet Take 1 tablet by mouth daily.      . carvedilol (COREG) 6.25 MG tablet Take 1 tablet (6.25 mg total) by mouth 2 (two) times daily with a meal.  60 tablet  6  . furosemide (LASIX) 40 MG tablet Take 40 mg by mouth 2 (two) times daily.       . hydrochlorothiazide (HYDRODIURIL) 12.5 MG tablet Take 1 tablet (12.5 mg total) by mouth daily.  30 tablet  0  . potassium chloride (MICRO-K) 10 MEQ CR capsule TAKE 1 TABLET TWICE DAIL 3 TO 4 TIMES A WEEK ALL OTHER DAYS TAKE 1 CAPSULE  30 capsule  1  . pramipexole (MIRAPEX) 0.125 MG tablet Take 1-2 tablets (0.125-0.25 mg total) by mouth at bedtime as needed (for restless leg).  60 tablet  2  . triamcinolone lotion (KENALOG) 0.1 % Apply 1 application topically 2 (two)  times daily.  120 mL  0   No current facility-administered medications for this visit.    Allergies:   Amlodipine and Atorvastatin   Social History:  The patient  reports that he quit smoking about 54 years ago. His smoking use included Cigarettes. He has a 30 pack-year smoking history. He has never used smokeless tobacco. He reports that he does not drink alcohol or use illicit drugs.   Family History:  The patient's family history is negative for Heart disease and Diabetes.   ROS:  Please see the history of present illness.   He has occasional scant BRBPR from hemorrhoids.   All other systems reviewed and negative.   PHYSICAL EXAM: VS:  BP 122/70  Pulse 79  Ht 5\' 7"  (1.702 m)  Wt 210 lb (95.255 kg)  BMI 32.88 kg/m2 Well nourished, well developed, in no acute distress HEENT: normal Neck: no JVD Cardiac:  normal S1, S2; RRR; 2/6 systolic murmurRUSB Lungs:  clear to auscultation bilaterally, no wheezing, rhonchi or rales Abd: soft, nontender, no hepatomegaly Ext: 2+ bilateral LE edema, L>R (L leg wrapped) Skin: warm and dry Neuro:  CNs 2-12 intact, no focal abnormalities noted  EKG:  AV paced, HR 79     ASSESSMENT AND PLAN:  1. Edema:  I suspect his edema is multifactorial and related to chronic diastolic CHF, elevated R heart pressures (mod TR), venous insufficiency, inactivity.  I had a long discussion with the patient today.  We discussed that his edema will likely be hard to treat.  Will continue with current diuretic regimen.  Check BMET today. Continue to elevate legs, watch NaCl intake.  Patient will donn LE compression stockings once his leg wrap is removed.   2. Chronic Diastolic CHF:  Continue current Rx. Volume appears stable.  Check BMET and BNP today.  If BNP is high, increase Lasix. 3. CAD:  No angina.  Continue aspirin. He is intolerant to statins. Continue beta blocker. 4. Atrial Fibrillation/Flutter:  Maintaining NSR. He is not on anticoagulation due to increased  bleeding risk and increased fall risk. 5. Tachy-Brady Syndrome, s/p Pacemaker:  Follow up with EP as planned. 6. Hypertension:  Controlled. 7. Orthostatic Intolerance:  No apparent recurrence. 8. Hyperlipidemia:  Intol to statins.   9. Disposition:  Follow up with Dr. Excell Seltzerooper or me in 2 months.  Signed, Tereso NewcomerScott Asta Corbridge, PA-C  01/11/2014 11:16 AM

## 2014-01-12 ENCOUNTER — Telehealth: Payer: Self-pay | Admitting: *Deleted

## 2014-01-12 DIAGNOSIS — I10 Essential (primary) hypertension: Secondary | ICD-10-CM

## 2014-01-12 DIAGNOSIS — I2510 Atherosclerotic heart disease of native coronary artery without angina pectoris: Secondary | ICD-10-CM

## 2014-01-12 NOTE — Telephone Encounter (Signed)
s/w pt's wife who has been notified about lab results and to have pt d/c HCTZ, bmet to be done 01/25/14. Wife verbalized understanding to me with verbal read back as well to Plan of Care

## 2014-01-25 ENCOUNTER — Telehealth: Payer: Self-pay | Admitting: *Deleted

## 2014-01-25 ENCOUNTER — Other Ambulatory Visit (INDEPENDENT_AMBULATORY_CARE_PROVIDER_SITE_OTHER): Payer: Medicare Other

## 2014-01-25 DIAGNOSIS — I10 Essential (primary) hypertension: Secondary | ICD-10-CM

## 2014-01-25 DIAGNOSIS — I2510 Atherosclerotic heart disease of native coronary artery without angina pectoris: Secondary | ICD-10-CM

## 2014-01-25 LAB — BASIC METABOLIC PANEL
BUN: 24 mg/dL — AB (ref 6–23)
CO2: 26 meq/L (ref 19–32)
Calcium: 8.7 mg/dL (ref 8.4–10.5)
Chloride: 104 mEq/L (ref 96–112)
Creatinine, Ser: 1.2 mg/dL (ref 0.4–1.5)
GFR: 58.19 mL/min — AB (ref >60.00)
GLUCOSE: 94 mg/dL (ref 70–99)
POTASSIUM: 4.4 meq/L (ref 3.5–5.1)
Sodium: 137 mEq/L (ref 135–145)

## 2014-01-25 NOTE — Telephone Encounter (Signed)
Home Health Certification paperwork received. Billing sheet attached and placed in blue folder for Dr. Drue NovelPaz. JG//CMA

## 2014-01-26 DIAGNOSIS — S81809A Unspecified open wound, unspecified lower leg, initial encounter: Secondary | ICD-10-CM

## 2014-01-26 DIAGNOSIS — S81009A Unspecified open wound, unspecified knee, initial encounter: Secondary | ICD-10-CM

## 2014-01-26 DIAGNOSIS — S91009A Unspecified open wound, unspecified ankle, initial encounter: Secondary | ICD-10-CM

## 2014-01-27 ENCOUNTER — Telehealth: Payer: Self-pay | Admitting: *Deleted

## 2014-01-27 ENCOUNTER — Other Ambulatory Visit: Payer: Self-pay | Admitting: *Deleted

## 2014-01-27 MED ORDER — HYDROCHLOROTHIAZIDE 12.5 MG PO CAPS: ORAL_CAPSULE | ORAL | Status: DC

## 2014-01-27 NOTE — Telephone Encounter (Signed)
Harvie Heckandy, [facility] called and stated that during his visit with patient on 01/26/2014, he noticed that patient's left leg had 3+ swelling and a small amount of weeping of open wound on left leg. Right leg was swollen, but not as much as left. He states that patient continues to have redness on both legs. He stated that he dressed left leg with compression wrap/coban. Next visit with patient by Harvie HeckRandy will be tomorrow.   CB#: [phone]

## 2014-01-27 NOTE — Telephone Encounter (Signed)
By the way the legs are described, he is close to baseline except for increased swelling. Recommend to continue with Lasix 40 mg 2 tablets twice a day, re start HCTZ 12.5 mg 3 times a week (Monday Wednesday and Friday), call #[phone], no RF Leg elevation 1-2 hours twice a day   Is key to help with the swelling Leg wrapping  needs to continue If he has more redness, fever, chills or purulent discharge he needs to be seen Schedule a OV in 10-to 14 days

## 2014-01-27 NOTE — Telephone Encounter (Signed)
Received signed forms back from Dr. Drue NovelPaz. All forms [facility] to [facility] at [phone]. Billing sheet placed in interoffice folder. JG//CMA

## 2014-01-27 NOTE — Telephone Encounter (Signed)
Called and informed [name] of Dr. Leta JunglingPaz's recommendations. A copy will be faxed to him at [phone].  I also spoke with patient's wife, [name]. She was informed of recommendations as well and she verbalized understanding. Medication was e-scribed to pharmacy and a follow up appt was made for Monday March 9th at 9 am. JG//CMA

## 2014-01-29 ENCOUNTER — Ambulatory Visit (INDEPENDENT_AMBULATORY_CARE_PROVIDER_SITE_OTHER): Payer: Medicare Other | Admitting: *Deleted

## 2014-01-29 DIAGNOSIS — I498 Other specified cardiac arrhythmias: Secondary | ICD-10-CM

## 2014-01-29 DIAGNOSIS — I4891 Unspecified atrial fibrillation: Secondary | ICD-10-CM

## 2014-01-29 DIAGNOSIS — I472 Ventricular tachycardia, unspecified: Secondary | ICD-10-CM

## 2014-01-29 DIAGNOSIS — I4729 Other ventricular tachycardia: Secondary | ICD-10-CM

## 2014-01-29 DIAGNOSIS — I495 Sick sinus syndrome: Secondary | ICD-10-CM

## 2014-02-01 ENCOUNTER — Telehealth: Payer: Self-pay

## 2014-02-01 NOTE — Telephone Encounter (Signed)
Message left from [place] with [facility] concerning patient taking his lasix and HCTZ. LM for [facility]  Called patient who states that he has been taking the Lasix 40 mg bid, has not been taking the HCTZ. He went to cardiology and they instructed him not to take the HCTZ.

## 2014-02-01 NOTE — Telephone Encounter (Signed)
Patient contacted and scheduled to see Tereso NewcomerScott Weaver, PA, for OV on Thursday, 02/04/14, at 12:10 pm.

## 2014-02-01 NOTE — Telephone Encounter (Signed)
See notes from primary care.  HCTZ was stopped by me 01/12/14 (increasing BUN; near normal BNP).  We have not made any other recommendations regarding HCTZ since that time.  Dr. Drue NovelPaz recommended resuming HCTZ recently.  HHRN has had concerns about swelling and medications.  Please contact HHRN for updates.  Have patient follow up this week for recheck on legs with Dr. Tonny BollmanMichael Cooper or me on a day Dr. Excell Seltzerooper is here.  Tereso NewcomerScott Praise Stennett, PA-C   02/01/2014 1:28 PM

## 2014-02-01 NOTE — Telephone Encounter (Signed)
I noted that but he has worsening edema. I will send this note to Tereso NewcomerScott Weaver at cardiology For further advise

## 2014-02-03 ENCOUNTER — Other Ambulatory Visit: Payer: Self-pay | Admitting: Internal Medicine

## 2014-02-04 ENCOUNTER — Encounter: Payer: Self-pay | Admitting: Physician Assistant

## 2014-02-04 ENCOUNTER — Ambulatory Visit (INDEPENDENT_AMBULATORY_CARE_PROVIDER_SITE_OTHER): Payer: Medicare Other | Admitting: Physician Assistant

## 2014-02-04 VITALS — BP 130/80 | HR 78 | Ht 67.0 in | Wt 225.0 lb

## 2014-02-04 DIAGNOSIS — I4891 Unspecified atrial fibrillation: Secondary | ICD-10-CM

## 2014-02-04 DIAGNOSIS — R609 Edema, unspecified: Secondary | ICD-10-CM

## 2014-02-04 DIAGNOSIS — I10 Essential (primary) hypertension: Secondary | ICD-10-CM

## 2014-02-04 DIAGNOSIS — E785 Hyperlipidemia, unspecified: Secondary | ICD-10-CM

## 2014-02-04 DIAGNOSIS — I2510 Atherosclerotic heart disease of native coronary artery without angina pectoris: Secondary | ICD-10-CM

## 2014-02-04 DIAGNOSIS — I5032 Chronic diastolic (congestive) heart failure: Secondary | ICD-10-CM

## 2014-02-04 LAB — BASIC METABOLIC PANEL
BUN: 19 mg/dL (ref 6–23)
CALCIUM: 8.5 mg/dL (ref 8.4–10.5)
CO2: 26 meq/L (ref 19–32)
CREATININE: 1.1 mg/dL (ref 0.4–1.5)
Chloride: 110 mEq/L (ref 96–112)
GFR: 67.52 mL/min (ref >60.00)
Glucose, Bld: 90 mg/dL (ref 70–99)
Potassium: 4.6 mEq/L (ref 3.5–5.1)
Sodium: 139 mEq/L (ref 135–145)

## 2014-02-04 MED ORDER — TORSEMIDE 20 MG PO TABS: 20.0000 mg | ORAL_TABLET | Freq: Two times a day (BID) | ORAL | Status: DC

## 2014-02-04 NOTE — Patient Instructions (Signed)
Your physician has recommended you make the following change in your medication:   1. Stop Lasix 2. Start Torsemide 20 mg twice daily  Your physician recommends that you have labs today: BMET  Your physician recommends that you return for lab work in: 1 week , BMET  Your physician recommends that you follow-up as scheduled.

## 2014-02-04 NOTE — Progress Notes (Signed)
[address], [address] [place], [place]  [address] Phone: [phone] Fax:  [phone]  Date:  02/04/2014   ID:  [name], DOB 1921/08/30, MRN [id number]  PCP:  Willow Ora, MD  Cardiologist:  Dr. Tonny Bollman   Electrophysiologist:  Dr. [name]    History of Present Illness: [name] is a 80 y.o. male with a hx of CAD, s/p CABG in 2007, atrial fibrillation/flutter, tachybradycardia syndrome, s/p pacemaker, diastolic CHF, HTN, HL, bladder outlet obstruction 2/2 to urethral stricture from TURP many years ago, orthostatic hypotension with syncope and recurrent falls.  Last cath in 06/2010 in setting of NSTEMI demonstrated patent grafts and subtotal occlusion of marginal branch treated medically.  He is not on anticoagulation due to hemorrhoidal bleeding and frequent falls (Xarelto stopped in 2014).   Last seen by Dr. Excell Seltzer 09/2013. Recently seen by primary care with volume excess (weight up 16 lbs).  His furosemide was adjusted.  He had marginal response and HCTZ was added.    I saw him 2/9 for evaluation of LE edema.  I felt his edema was multifactorial and was not convinced that we should push his diuretics further. His BNP was near normal and his BUN was increased and I stopped his HCTZ.  HHRN has contacted his PCP recently.  The HCTZ was resumed.  He returns for f/u.  He continues to have increased LE edema. This has been worse over the last several months. He does note increasing dyspnea. He describes NYHA class III symptoms. He sleeps in a recliner for comfort but not for breathing. He denies PND. He denies chest pain or syncope.  Echo (03/2013):  Mod LVH, EF 50-55%, mild MR, mod LAE, mild RVE, mod RAE, mod TR  Recent Labs: 08/06/2013: ALT 14; Hemoglobin 11.3*  01/11/2014: Pro B Natriuretic peptide (BNP) 146.0*  01/25/2014: Creatinine 1.2; Potassium 4.4   Wt Readings from Last 3 Encounters:  02/04/14 225 lb (102.059 kg)  01/11/14 210 lb (95.255 kg)  01/05/14 220  lb (99.791 kg)     Past Medical History  Diagnosis Date  . A-fib     paroxysmal, s/p TEE-DCCV 03/2013;  was on coumadin , pt desired to stop it , no longer a candidate per cards (hemorrhoidal bleeding; falls)  . Tachy-brady syndrome     s/p pacemaker  . CAD (coronary artery disease)     a. MI angioplasty 93, b. s/p CABG 01/2006;   c.  NSTEMI 06/2010 - [facility]:  L-LAD ok, S-Dx ok, S-OM ok, S-PDA ok, small OM sub total occluded - Med Rx  . Chronic diastolic heart failure   . Hyperlipidemia   . Hypertension   . Spinal stenosis      has seen Dr Ethelene Hal before, s/p local injection which helped x a while   . GERD (gastroesophageal reflux disease)   . RLS (restless legs syndrome)     ? of  . Insomnia   . History of BPH     TURP '96 at [facility] 96, urethoplast 97 ( History of bladder outlet obstruction, chronic incontinence)  . Pacemaker   . B12 deficiency anemia   . Myocardial infarction 01/1992; 06/2010  . Arthritis     back  . Hx of cardiovascular stress test     a.  Myoview (10/2005):  EF 62%, mild apical ischemia  . Hx of echocardiogram     a.  Echo (03/23/13):  mod LVH, EF 50-55%, no WMA, mild MR, mod LAE,  mild RVE, mod RAE, mod TR  . Atrial flutter     Current Outpatient Prescriptions  Medication Sig Dispense Refill  . acetaminophen (TYLENOL) 500 MG tablet Take 500 mg by mouth every 8 (eight) hours as needed for pain.      Marland Kitchen aspirin EC 81 MG tablet Take 81 mg by mouth daily.      . B Complex-C (B-COMPLEX WITH VITAMIN C) tablet Take 1 tablet by mouth daily.      . beta carotene w/minerals (OCUVITE) tablet Take 1 tablet by mouth daily.      . carvedilol (COREG) 6.25 MG tablet Take 1 tablet (6.25 mg total) by mouth 2 (two) times daily with a meal.  60 tablet  6  . furosemide (LASIX) 40 MG tablet Take 40 mg by mouth 2 (two) times daily.       . potassium chloride (MICRO-K) 10 MEQ CR capsule TAKE 1 TABLET TWICE DAIL 3 TO 4 TIMES A WEEK ALL OTHER DAYS TAKE 1 CAPSULE  30 capsule  1  . pramipexole  (MIRAPEX) 0.125 MG tablet TAKE 1 TO 2 TABLETS BY MOUTH AT BEDTIME AS NEEDED FOR RESTLESS LEGS  60 tablet  1  . triamcinolone lotion (KENALOG) 0.1 % Apply 1 application topically 2 (two) times daily.  120 mL  0   No current facility-administered medications for this visit.    Allergies:   Amlodipine and Atorvastatin   Social History:  The patient  reports that he quit smoking about 55 years ago. His smoking use included Cigarettes. He has a 30 pack-year smoking history. He has never used smokeless tobacco. He reports that he does not drink alcohol or use illicit drugs.   Family History:  The patient's family history is negative for Heart disease and Diabetes.   ROS:  Please see the history of present illness.     All other systems reviewed and negative.   PHYSICAL EXAM: VS:  BP 130/80  Pulse 78  Ht 5\' 7"  (1.702 m)  Wt 225 lb (102.059 kg)  BMI 35.23 kg/m2 Well nourished, well developed, in no acute distress HEENT: normal Neck: no JVDat 90 Cardiac:  normal S1, S2; RRR; 2/6 systolic murmurRUSB Lungs:  clear to auscultation bilaterally, no wheezing, rhonchi or rales Abd: soft, nontender, no hepatomegaly Ext: 2+ bilateral LE edema, L>R  Skin: warm and dry Neuro:  CNs 2-12 intact, no focal abnormalities noted  EKG:   AV paced, HR 78     ASSESSMENT AND PLAN:  1. Edema:  I still suspect that this is multifactorial and related to diastolic CHF, venous insufficiency, deconditioning. He has moderate TR on recent echocardiogram as well as mild RVE. I suspect he has an element of right-sided HF as well. He may do better with torsemide instead of furosemide. I had a long discussion with him and his wife regarding this. I will stop Lasix and start him on torsemide 20 mg twice a day. Check a basic metabolic panel today and repeat a basic metabolic panel in one week. 2. Chronic Diastolic CHF:  Adjusted diuresis as noted. 3. CAD:  No angina.  Continue aspirin. He is intolerant to statins. Continue  beta blocker. 4. Atrial Fibrillation/Flutter:  Maintaining NSR. He is not on anticoagulation due to increased bleeding risk and increased fall risk. 5. Tachy-Brady Syndrome, s/p Pacemaker:  Follow up with EP as planned. 6. Hypertension:  Controlled.  7. Orthostatic Intolerance:  No apparent recurrence. 8. Hyperlipidemia:  Intol to statins.   9. Disposition:  Follow up with Dr. Tonny BollmanMichael Cooper as planned next month.   Signed, Tereso NewcomerScott Miki Labuda, PA-C  02/04/2014 1:49 PM

## 2014-02-05 LAB — MDC_IDC_ENUM_SESS_TYPE_REMOTE
Brady Statistic AP VS Percent: 0 %
Brady Statistic AS VS Percent: 0 %
Lead Channel Pacing Threshold Amplitude: 0.5 V
Lead Channel Pacing Threshold Amplitude: 0.625 V
Lead Channel Pacing Threshold Pulse Width: 0.4 ms
Lead Channel Pacing Threshold Pulse Width: 0.4 ms
MDC IDC MSMT BATTERY IMPEDANCE: 2116 Ohm
MDC IDC MSMT BATTERY REMAINING LONGEVITY: 19 mo
MDC IDC MSMT BATTERY VOLTAGE: 2.7 V
MDC IDC MSMT LEADCHNL RA IMPEDANCE VALUE: 445 Ohm
MDC IDC MSMT LEADCHNL RV IMPEDANCE VALUE: 422 Ohm
MDC IDC SESS DTM: 20150227234641
MDC IDC SET LEADCHNL RA PACING AMPLITUDE: 2 V
MDC IDC SET LEADCHNL RV PACING AMPLITUDE: 2.5 V
MDC IDC SET LEADCHNL RV PACING PULSEWIDTH: 0.4 ms
MDC IDC SET LEADCHNL RV SENSING SENSITIVITY: 1.4 mV
MDC IDC STAT BRADY AP VP PERCENT: 89 %
MDC IDC STAT BRADY AS VP PERCENT: 11 %

## 2014-02-08 ENCOUNTER — Ambulatory Visit (INDEPENDENT_AMBULATORY_CARE_PROVIDER_SITE_OTHER): Payer: Medicare Other | Admitting: Internal Medicine

## 2014-02-08 ENCOUNTER — Encounter: Payer: Self-pay | Admitting: Internal Medicine

## 2014-02-08 VITALS — BP 105/62 | HR 78 | Temp 97.7°F | Wt 218.0 lb

## 2014-02-08 DIAGNOSIS — R600 Localized edema: Secondary | ICD-10-CM

## 2014-02-08 DIAGNOSIS — L258 Unspecified contact dermatitis due to other agents: Secondary | ICD-10-CM

## 2014-02-08 DIAGNOSIS — L03116 Cellulitis of left lower limb: Secondary | ICD-10-CM

## 2014-02-08 DIAGNOSIS — R609 Edema, unspecified: Secondary | ICD-10-CM

## 2014-02-08 DIAGNOSIS — L853 Xerosis cutis: Secondary | ICD-10-CM

## 2014-02-08 NOTE — Assessment & Plan Note (Signed)
Since last time he was here, he saw cardiology, Lasix was switched to torsemide last week. Weight has decreased from 225 pounds to 218 pounds, has a BMP scheduled for this week. In addition to diuretics I reminded patient about leg elevation and a compression stocking. From this point on, I will ask the patient to manage his diuretics through cardiology.

## 2014-02-08 NOTE — Assessment & Plan Note (Signed)
Dry skin in the upper extremities, unable to afford Kenalog. Plan: OTC hydrocortisone 1% + Aveeno

## 2014-02-08 NOTE — Patient Instructions (Signed)
Hydrocortisone 1% cream OTC and Aveeno moisturizing lotion: Used twice a day every day  Next visit is for routine check up   in 2 months No need to come back fasting Please make an appointment

## 2014-02-08 NOTE — Progress Notes (Signed)
Subjective:    Patient ID: [name], male    DOB: 08/19/1921, 80 y.o.   MRN: [id number]  DOS:  02/08/2014 Type of  visit: Followup from previous visit  Since the last time he was here, saw cardiology, diuretics were adjusted. Cardiology note reviewed. Weights reviewed. Continue with dry skin, mostly at his hands, unable to afford any of the creams I prescribed.  ROS   Past Medical History  Diagnosis Date  . A-fib     paroxysmal, s/p TEE-DCCV 03/2013;  was on coumadin , pt desired to stop it , no longer a candidate per cards (hemorrhoidal bleeding; falls)  . Tachy-brady syndrome     s/p pacemaker  . CAD (coronary artery disease)     a. MI angioplasty 93, b. s/p CABG 01/2006;   c.  NSTEMI 06/2010 - [facility]:  L-LAD ok, S-Dx ok, S-OM ok, S-PDA ok, small OM sub total occluded - Med Rx  . Chronic diastolic heart failure   . Hyperlipidemia   . Hypertension   . Spinal stenosis      has seen Dr Ethelene Hal before, s/p local injection which helped x a while   . GERD (gastroesophageal reflux disease)   . RLS (restless legs syndrome)     ? of  . Insomnia   . History of BPH     TURP '96 at [facility] 96, urethoplast 97 ( History of bladder outlet obstruction, chronic incontinence)  . Pacemaker   . B12 deficiency anemia   . Myocardial infarction 01/1992; 06/2010  . Arthritis     back  . Hx of cardiovascular stress test     a.  Myoview (10/2005):  EF 62%, mild apical ischemia  . Hx of echocardiogram     a.  Echo (03/23/13):  mod LVH, EF 50-55%, no WMA, mild MR, mod LAE, mild RVE, mod RAE, mod TR  . Atrial flutter     Past Surgical History  Procedure Laterality Date  . Coronary artery bypass graft      "CABG X4" (05/14/2013)  . Cystoscopy with urethral dilatation  1997    "fixed strictures from TURP" (05/14/2013)  . Inguinal hernia repair Right   . Carpal tunnel release Bilateral ~ 2005    Dr Teressa Senter  . Insert / replace / remove pacemaker  12/2006    PPM [employer]  . Tee without cardioversion  N/A 03/27/2013    Procedure: TRANSESOPHAGEAL ECHOCARDIOGRAM (TEE);  Surgeon: Lewayne Bunting, MD;  Location: [facility] ENDOSCOPY;  Service: Cardiovascular;  Laterality: N/A;  . Cardioversion N/A 03/27/2013    Procedure: CARDIOVERSION;  Surgeon: Lewayne Bunting, MD;  Location: [facility] ENDOSCOPY;  Service: Cardiovascular;  Laterality: N/A;  . Total knee arthroplasty Bilateral     right 2005, Left 2006.  Dr  Despina Hick  . Coronary angioplasty      "Dr. Riley Kill"  . Cataract extraction w/ intraocular lens  implant, bilateral Bilateral 2012  . Transurethral resection of prostate  1996    History   Social History  . Marital Status: Married    Spouse Name: N/A    Number of Children: 2  . Years of Education: N/A   Occupational History  . retired     Social History Main Topics  . Smoking status: Former Smoker -- 2.00 packs/day for 15 years    Types: Cigarettes    Quit date: 02/01/1959  . Smokeless tobacco: Never Used  . Alcohol Use: No  . Drug Use: No  . Sexual Activity: Not  Currently   Other Topics Concern  . Not on file   Social History Narrative   Lives at home  w/ wife, wife drives         Medication List       This list is accurate as of: 02/08/14  6:02 PM.  Always use your most recent med list.               acetaminophen 500 MG tablet  Commonly known as:  TYLENOL  Take 500 mg by mouth every 8 (eight) hours as needed for pain.     aspirin EC 81 MG tablet  Take 81 mg by mouth daily.     B-complex with vitamin C tablet  Take 1 tablet by mouth daily.     beta carotene w/minerals tablet  Take 1 tablet by mouth daily.     carvedilol 6.25 MG tablet  Commonly known as:  COREG  Take 1 tablet (6.25 mg total) by mouth 2 (two) times daily with a meal.     potassium chloride 10 MEQ CR capsule  Commonly known as:  MICRO-K  TAKE 1 TABLET TWICE DAIL 3 TO 4 TIMES A WEEK ALL OTHER DAYS TAKE 1 CAPSULE     pramipexole 0.125 MG tablet  Commonly known as:  MIRAPEX  TAKE 1 TO 2 TABLETS  BY MOUTH AT BEDTIME AS NEEDED FOR RESTLESS LEGS     torsemide 20 MG tablet  Commonly known as:  DEMADEX  Take 1 tablet (20 mg total) by mouth 2 (two) times daily.           Objective:   Physical Exam BP 105/62  Pulse 78  Temp(Src) 97.7 F (36.5 C)  Wt 218 lb (98.884 kg)  SpO2 98%  General -- alert, well-developed, NAD.   Lungs -- normal respiratory effort, no intercostal retractions, no accessory muscle use, and normal breath sounds.  Heart-- normal rate, regular rhythm, syst  Murmur?Marland Kitchen.  Extremities--  In general has less leg edema. Still has redness-warmness up to the mid left pretibial area. Oozing wound @ left calf ---drying up  Neurologic--  alert & oriented X3. Speech normal Psych-- Cognition and judgment appear intact. Cooperative with normal attention span and concentration. No anxious or depressed appearing.     Assessment & Plan:  Today , I spent more than 15   min with the patient, >50% of the time counseling (leg elevation, skin care)

## 2014-02-08 NOTE — Assessment & Plan Note (Addendum)
Left leg looks at baseline(has  redness up to the mid pretibial area and some tenderness) [facility] for a oozing wound at the L calf ----> looks better.

## 2014-02-08 NOTE — Progress Notes (Signed)
Pre visit review using our clinic review tool, if applicable. No additional management support is needed unless otherwise documented below in the visit note. 

## 2014-02-11 ENCOUNTER — Ambulatory Visit (INDEPENDENT_AMBULATORY_CARE_PROVIDER_SITE_OTHER): Payer: Medicare Other | Admitting: *Deleted

## 2014-02-11 ENCOUNTER — Telehealth: Payer: Self-pay | Admitting: *Deleted

## 2014-02-11 DIAGNOSIS — I5032 Chronic diastolic (congestive) heart failure: Secondary | ICD-10-CM

## 2014-02-11 DIAGNOSIS — I4891 Unspecified atrial fibrillation: Secondary | ICD-10-CM

## 2014-02-11 LAB — BASIC METABOLIC PANEL
BUN: 26 mg/dL — ABNORMAL HIGH (ref 6–23)
CALCIUM: 8.7 mg/dL (ref 8.4–10.5)
CHLORIDE: 99 meq/L (ref 96–112)
CO2: 31 meq/L (ref 19–32)
CREATININE: 1.6 mg/dL — AB (ref 0.4–1.5)
GFR: 42.74 mL/min — ABNORMAL LOW (ref >60.00)
GLUCOSE: 88 mg/dL (ref 70–99)
Potassium: 4 mEq/L (ref 3.5–5.1)
Sodium: 138 mEq/L (ref 135–145)

## 2014-02-11 MED ORDER — TORSEMIDE 20 MG PO TABS: 20.0000 mg | ORAL_TABLET | Freq: Every day | ORAL | Status: DC

## 2014-02-11 NOTE — Telephone Encounter (Signed)
pt notified about lab results and to decrease torsemide to 20 mg daily, bmet 3/20, pt verbalized understanding to Plan of Care.

## 2014-02-12 ENCOUNTER — Telehealth: Payer: Self-pay | Admitting: Internal Medicine

## 2014-02-12 ENCOUNTER — Other Ambulatory Visit: Payer: Self-pay | Admitting: Cardiovascular Disease

## 2014-02-12 NOTE — Telephone Encounter (Signed)
Demika from [facility] called to see if dr Drue NovelPaz will extend his orders until March 24 th because patient's wound is still open on his left leg. Patient is currently experiencing some wheezing in his right upper lobe.  Also Demika wanted to inform Dr Drue NovelPaz that Karren BurlyDwight stated that he weighed 207 yesterday with out clothes and today he weighed 209 with clothes.

## 2014-02-12 NOTE — Telephone Encounter (Signed)
Left message for call back from home health.  Non identifiable Talked with patient who states that he did not know he was wheezing. He feels fine with no fever, chills or cough. Advised to let us know if anything changes or go to ED. Spoke with home health and extended order per Dr Drue NovelPaz and they will check on patient Monday to be sure nothing is progressing in his lungs.  Patient states that he understands the symptoms to look for.

## 2014-02-13 ENCOUNTER — Inpatient Hospital Stay (HOSPITAL_COMMUNITY)
Admission: EM | Admit: 2014-02-13 | Discharge: 2014-02-14 | DRG: 603 | Disposition: A | Discharge status: Home Health | Payer: Medicare Other | Attending: Internal Medicine | Admitting: Internal Medicine

## 2014-02-13 ENCOUNTER — Encounter (HOSPITAL_COMMUNITY): Payer: Self-pay | Admitting: Emergency Medicine

## 2014-02-13 DIAGNOSIS — L02512 Cutaneous abscess of left hand: Secondary | ICD-10-CM

## 2014-02-13 DIAGNOSIS — E785 Hyperlipidemia, unspecified: Secondary | ICD-10-CM | POA: Diagnosis present

## 2014-02-13 DIAGNOSIS — Z950 Presence of cardiac pacemaker: Secondary | ICD-10-CM

## 2014-02-13 DIAGNOSIS — G2581 Restless legs syndrome: Secondary | ICD-10-CM | POA: Diagnosis present

## 2014-02-13 DIAGNOSIS — Z79899 Other long term (current) drug therapy: Secondary | ICD-10-CM

## 2014-02-13 DIAGNOSIS — I4891 Unspecified atrial fibrillation: Secondary | ICD-10-CM

## 2014-02-13 DIAGNOSIS — L03114 Cellulitis of left upper limb: Secondary | ICD-10-CM

## 2014-02-13 DIAGNOSIS — I25810 Atherosclerosis of coronary artery bypass graft(s) without angina pectoris: Secondary | ICD-10-CM

## 2014-02-13 DIAGNOSIS — I5032 Chronic diastolic (congestive) heart failure: Secondary | ICD-10-CM | POA: Diagnosis present

## 2014-02-13 DIAGNOSIS — Z7982 Long term (current) use of aspirin: Secondary | ICD-10-CM

## 2014-02-13 DIAGNOSIS — Z951 Presence of aortocoronary bypass graft: Secondary | ICD-10-CM

## 2014-02-13 DIAGNOSIS — L03119 Cellulitis of unspecified part of limb: Principal | ICD-10-CM

## 2014-02-13 DIAGNOSIS — K219 Gastro-esophageal reflux disease without esophagitis: Secondary | ICD-10-CM | POA: Diagnosis present

## 2014-02-13 DIAGNOSIS — I252 Old myocardial infarction: Secondary | ICD-10-CM

## 2014-02-13 DIAGNOSIS — I509 Heart failure, unspecified: Secondary | ICD-10-CM | POA: Diagnosis present

## 2014-02-13 DIAGNOSIS — M129 Arthropathy, unspecified: Secondary | ICD-10-CM | POA: Diagnosis present

## 2014-02-13 DIAGNOSIS — I10 Essential (primary) hypertension: Secondary | ICD-10-CM | POA: Diagnosis present

## 2014-02-13 DIAGNOSIS — G4700 Insomnia, unspecified: Secondary | ICD-10-CM | POA: Diagnosis present

## 2014-02-13 DIAGNOSIS — Z87891 Personal history of nicotine dependence: Secondary | ICD-10-CM

## 2014-02-13 DIAGNOSIS — I2510 Atherosclerotic heart disease of native coronary artery without angina pectoris: Secondary | ICD-10-CM | POA: Diagnosis present

## 2014-02-13 DIAGNOSIS — Z96659 Presence of unspecified artificial knee joint: Secondary | ICD-10-CM

## 2014-02-13 DIAGNOSIS — D518 Other vitamin B12 deficiency anemias: Secondary | ICD-10-CM | POA: Diagnosis present

## 2014-02-13 DIAGNOSIS — L02519 Cutaneous abscess of unspecified hand: Principal | ICD-10-CM

## 2014-02-13 DIAGNOSIS — Z888 Allergy status to other drugs, medicaments and biological substances status: Secondary | ICD-10-CM

## 2014-02-13 DIAGNOSIS — N179 Acute kidney failure, unspecified: Secondary | ICD-10-CM

## 2014-02-13 DIAGNOSIS — E876 Hypokalemia: Secondary | ICD-10-CM

## 2014-02-13 LAB — CBC WITH DIFFERENTIAL/PLATELET
BASOS ABS: 0 10*3/uL (ref 0.0–0.1)
Basophils Relative: 0 % (ref 0–1)
EOS PCT: 2 % (ref 0–5)
Eosinophils Absolute: 0.3 10*3/uL (ref 0.0–0.7)
HCT: 36.8 % — ABNORMAL LOW (ref 39.0–52.0)
Hemoglobin: 12.4 g/dL — ABNORMAL LOW (ref 13.0–17.0)
LYMPHS PCT: 12 % (ref 12–46)
Lymphs Abs: 1.5 10*3/uL (ref 0.7–4.0)
MCH: 31.2 pg (ref 26.0–34.0)
MCHC: 33.7 g/dL (ref 30.0–36.0)
MCV: 92.5 fL (ref 78.0–100.0)
Monocytes Absolute: 0.9 10*3/uL (ref 0.1–1.0)
Monocytes Relative: 7 % (ref 3–12)
NEUTROS PCT: 80 % — AB (ref 43–77)
Neutro Abs: 10.6 10*3/uL — ABNORMAL HIGH (ref 1.7–7.7)
PLATELETS: 253 10*3/uL (ref 150–400)
RBC: 3.98 MIL/uL — ABNORMAL LOW (ref 4.22–5.81)
RDW: 13.5 % (ref 11.5–15.5)
WBC: 13.3 10*3/uL — AB (ref 4.0–10.5)

## 2014-02-13 LAB — BASIC METABOLIC PANEL
BUN: 32 mg/dL — ABNORMAL HIGH (ref 6–23)
CALCIUM: 9.3 mg/dL (ref 8.4–10.5)
CO2: 29 meq/L (ref 19–32)
Chloride: 96 mEq/L (ref 96–112)
Creatinine, Ser: 1.47 mg/dL — ABNORMAL HIGH (ref 0.50–1.35)
GFR calc Af Amer: 47 mL/min — ABNORMAL LOW (ref >90)
GFR calc non Af Amer: 40 mL/min — ABNORMAL LOW (ref >90)
Glucose, Bld: 108 mg/dL — ABNORMAL HIGH (ref 70–99)
Potassium: 3.9 mEq/L (ref 3.7–5.3)
Sodium: 139 mEq/L (ref 137–147)

## 2014-02-13 MED ORDER — CARVEDILOL 6.25 MG PO TABS
6.2500 mg | ORAL_TABLET | Freq: Two times a day (BID) | ORAL | Status: DC
  Filled 2014-02-13 (×2): qty 1

## 2014-02-13 MED ORDER — PRAMIPEXOLE DIHYDROCHLORIDE 0.25 MG PO TABS
0.2500 mg | ORAL_TABLET | Freq: Every evening | ORAL | Status: DC | PRN
  Administered 2014-02-13: 0.25 mg via ORAL
  Filled 2014-02-13 (×2): qty 1

## 2014-02-13 MED ORDER — OCUVITE PO TABS
1.0000 | ORAL_TABLET | Freq: Every day | ORAL | Status: DC
  Administered 2014-02-14: 1 via ORAL
  Filled 2014-02-13: qty 1

## 2014-02-13 MED ORDER — ONDANSETRON HCL 4 MG/2ML IJ SOLN: 4.0000 mg | Freq: Four times a day (QID) | INTRAMUSCULAR | Status: DC | PRN

## 2014-02-13 MED ORDER — SODIUM CHLORIDE 0.9 % IJ SOLN
3.0000 mL | Freq: Two times a day (BID) | INTRAMUSCULAR | Status: DC
Start: 2014-02-13 — End: 2014-02-14

## 2014-02-13 MED ORDER — SODIUM CHLORIDE 0.9 % IV SOLN
250.0000 mL | INTRAVENOUS | Status: DC | PRN
Start: 2014-02-13 — End: 2014-02-14
  Administered 2014-02-13: 250 mL via INTRAVENOUS

## 2014-02-13 MED ORDER — ACETAMINOPHEN 650 MG RE SUPP: 650.0000 mg | Freq: Four times a day (QID) | RECTAL | Status: DC | PRN

## 2014-02-13 MED ORDER — OXYCODONE HCL 5 MG PO TABS: 5.0000 mg | ORAL_TABLET | ORAL | Status: DC | PRN

## 2014-02-13 MED ORDER — TORSEMIDE 20 MG PO TABS
20.0000 mg | ORAL_TABLET | Freq: Every day | ORAL | Status: DC
  Filled 2014-02-13: qty 1

## 2014-02-13 MED ORDER — ACETAMINOPHEN 325 MG PO TABS: 650.0000 mg | ORAL_TABLET | Freq: Four times a day (QID) | ORAL | Status: DC | PRN

## 2014-02-13 MED ORDER — SODIUM CHLORIDE 0.9 % IJ SOLN
3.0000 mL | INTRAMUSCULAR | Status: DC | PRN
Start: 2014-02-13 — End: 2014-02-14

## 2014-02-13 MED ORDER — SENNOSIDES-DOCUSATE SODIUM 8.6-50 MG PO TABS: 1.0000 | ORAL_TABLET | Freq: Every evening | ORAL | Status: DC | PRN

## 2014-02-13 MED ORDER — ASPIRIN EC 81 MG PO TBEC
81.0000 mg | DELAYED_RELEASE_TABLET | Freq: Every day | ORAL | Status: DC
  Filled 2014-02-13: qty 1

## 2014-02-13 MED ORDER — PIPERACILLIN-TAZOBACTAM 3.375 G IVPB: 3.3750 g | Freq: Once | INTRAVENOUS | Status: DC

## 2014-02-13 MED ORDER — PIPERACILLIN-TAZOBACTAM 3.375 G IVPB 30 MIN
3.3750 g | INTRAVENOUS | Status: AC
  Administered 2014-02-13: 3.375 g via INTRAVENOUS
  Filled 2014-02-13: qty 50

## 2014-02-13 MED ORDER — ONDANSETRON HCL 4 MG PO TABS: 4.0000 mg | ORAL_TABLET | Freq: Four times a day (QID) | ORAL | Status: DC | PRN

## 2014-02-13 MED ORDER — CLINDAMYCIN PHOSPHATE 600 MG/50ML IV SOLN
600.0000 mg | Freq: Three times a day (TID) | INTRAVENOUS | Status: DC
  Administered 2014-02-13 – 2014-02-14 (×2): 600 mg via INTRAVENOUS
  Filled 2014-02-13 (×4): qty 50

## 2014-02-13 MED ORDER — B COMPLEX-C PO TABS
1.0000 | ORAL_TABLET | Freq: Every day | ORAL | Status: DC
  Administered 2014-02-14: 1 via ORAL
  Filled 2014-02-13: qty 1

## 2014-02-13 MED ORDER — CARVEDILOL 6.25 MG PO TABS
6.2500 mg | ORAL_TABLET | Freq: Two times a day (BID) | ORAL | Status: DC
  Administered 2014-02-13 – 2014-02-14 (×2): 6.25 mg via ORAL
  Filled 2014-02-13 (×4): qty 1

## 2014-02-13 MED ORDER — ZOLPIDEM TARTRATE 5 MG PO TABS
5.0000 mg | ORAL_TABLET | Freq: Every evening | ORAL | Status: DC | PRN
  Administered 2014-02-13: 5 mg via ORAL
  Filled 2014-02-13: qty 1

## 2014-02-13 MED ORDER — SODIUM CHLORIDE 0.9 % IV SOLN
Freq: Once | INTRAVENOUS | Status: AC
  Administered 2014-02-13: 20 mL/h via INTRAVENOUS

## 2014-02-13 NOTE — ED Provider Notes (Signed)
CSN: [id number]     Arrival date & time 02/13/14  1228 History   First MD Initiated Contact with Patient 02/13/14 1310     Chief Complaint  Patient presents with  . Abscess     (Consider location/radiation/quality/duration/timing/severity/associated sxs/prior Treatment) Patient is a 80 y.o. male presenting with abscess. The history is provided by the patient and the spouse. No language interpreter was used.  Abscess Location:  Hand Hand abscess location:  L hand Associated symptoms: no fever   Associated symptoms comment:  He started with symptoms of pain in dorsal left hand associated with redness and warmth. He denies injury to the hand prior to when symptoms started, which was 2 days ago. No fever. Today when he woke, he found an area overlying the 2nd MCP joint that was grossly swollen and blister-like. He denies symptoms of pain or redness that extend beyond the hand.    Past Medical History  Diagnosis Date  . A-fib     paroxysmal, s/p TEE-DCCV 03/2013;  was on coumadin , pt desired to stop it , no longer a candidate per cards (hemorrhoidal bleeding; falls)  . Tachy-brady syndrome     s/p pacemaker  . CAD (coronary artery disease)     a. MI angioplasty 93, b. s/p CABG 01/2006;   c.  NSTEMI 06/2010 - [facility]:  L-LAD ok, S-Dx ok, S-OM ok, S-PDA ok, small OM sub total occluded - Med Rx  . Chronic diastolic heart failure   . Hyperlipidemia   . Hypertension   . Spinal stenosis      has seen Dr Ethelene Hal before, s/p local injection which helped x a while   . GERD (gastroesophageal reflux disease)   . RLS (restless legs syndrome)     ? of  . Insomnia   . History of BPH     TURP '96 at [facility] 96, urethoplast 97 ( History of bladder outlet obstruction, chronic incontinence)  . Pacemaker   . B12 deficiency anemia   . Myocardial infarction 01/1992; 06/2010  . Arthritis     back  . Hx of cardiovascular stress test     a.  Myoview (10/2005):  EF 62%, mild apical ischemia  . Hx of echocardiogram      a.  Echo (03/23/13):  mod LVH, EF 50-55%, no WMA, mild MR, mod LAE, mild RVE, mod RAE, mod TR  . Atrial flutter    Past Surgical History  Procedure Laterality Date  . Coronary artery bypass graft      "CABG X4" (05/14/2013)  . Cystoscopy with urethral dilatation  1997    "fixed strictures from TURP" (05/14/2013)  . Inguinal hernia repair Right   . Carpal tunnel release Bilateral ~ 2005    Dr Teressa Senter  . Insert / replace / remove pacemaker  12/2006    PPM [employer]  . Tee without cardioversion N/A 03/27/2013    Procedure: TRANSESOPHAGEAL ECHOCARDIOGRAM (TEE);  Surgeon: Lewayne Bunting, MD;  Location: [facility] ENDOSCOPY;  Service: Cardiovascular;  Laterality: N/A;  . Cardioversion N/A 03/27/2013    Procedure: CARDIOVERSION;  Surgeon: Lewayne Bunting, MD;  Location: [facility] ENDOSCOPY;  Service: Cardiovascular;  Laterality: N/A;  . Total knee arthroplasty Bilateral     right 2005, Left 2006.  Dr  Despina Hick  . Coronary angioplasty      "Dr. Riley Kill"  . Cataract extraction w/ intraocular lens  implant, bilateral Bilateral 2012  . Transurethral resection of prostate  1996   Family History  Problem Relation Age  of Onset  . Heart disease Neg Hx   . Diabetes Neg Hx    History  Substance Use Topics  . Smoking status: Former Smoker -- 2.00 packs/day for 15 years    Types: Cigarettes    Quit date: 02/01/1959  . Smokeless tobacco: Never Used  . Alcohol Use: No    Review of Systems  Constitutional: Negative for fever and chills.  HENT: Negative.   Respiratory: Negative.   Cardiovascular: Negative.   Gastrointestinal: Negative.   Musculoskeletal: Negative.   Skin: Negative.   Neurological: Negative.       Allergies  Amlodipine; Atorvastatin; and Zinc  Home Medications   Current Outpatient Rx  Name  Route  Sig  Dispense  Refill  . acetaminophen (TYLENOL) 500 MG tablet   Oral   Take 500 mg by mouth every 8 (eight) hours as needed for pain.         Marland Kitchen. aspirin EC 81 MG tablet   Oral    Take 81 mg by mouth daily.         . B Complex-C (B-COMPLEX WITH VITAMIN C) tablet   Oral   Take 1 tablet by mouth daily.         . beta carotene w/minerals (OCUVITE) tablet   Oral   Take 1 tablet by mouth daily.         . carvedilol (COREG) 6.25 MG tablet   Oral   Take 6.25 mg by mouth 2 (two) times daily with a meal.         . potassium chloride (MICRO-K) 10 MEQ CR capsule      Take 2 capsules by mouth every other day, alternating with 1 capsule daily.         . pramipexole (MIRAPEX) 0.125 MG tablet   Oral   Take 0.25 mg by mouth at bedtime as needed (restless legs).         . torsemide (DEMADEX) 20 MG tablet   Oral   Take 20 mg by mouth daily.          BP 119/69  Pulse 74  Temp(Src) 97.6 F (36.4 C) (Oral)  Resp 16  Wt 206 lb (93.441 kg)  SpO2 99% Physical Exam  Constitutional: He is oriented to person, place, and time. He appears well-developed and well-nourished. No distress.  HENT:  Head: Normocephalic.  Eyes: Conjunctivae are normal.  Neck: Normal range of motion.  Pulmonary/Chest: Effort normal.  Musculoskeletal: Normal range of motion. He exhibits tenderness.  Neurological: He is alert and oriented to person, place, and time.  Skin: Skin is warm. There is erythema.  2 cm x 3 cm fluctuant area dorsum left hand. Tender. C/w cutaneous abscess. There is warm redness over entire dorsum, base of fingers 2-5. No confluent or streaking of redness on forearm.   Psychiatric: He has a normal mood and affect.    ED Course  Procedures (including critical care time) Labs Review Labs Reviewed  CBC WITH DIFFERENTIAL - Abnormal; Notable for the following:    WBC 13.3 (*)    RBC 3.98 (*)    Hemoglobin 12.4 (*)    HCT 36.8 (*)    Neutrophils Relative % 80 (*)    Neutro Abs 10.6 (*)    All other components within normal limits  BASIC METABOLIC PANEL   Results for orders placed during the hospital encounter of 02/13/14  CBC WITH DIFFERENTIAL       Result Value Ref Range   WBC  13.3 (*) 4.0 - 10.5 K/uL   RBC 3.98 (*) 4.22 - 5.81 MIL/uL   Hemoglobin 12.4 (*) 13.0 - 17.0 g/dL   HCT 16.1 (*) 09.6 - 04.5 %   MCV 92.5  78.0 - 100.0 fL   MCH 31.2  26.0 - 34.0 pg   MCHC 33.7  30.0 - 36.0 g/dL   RDW 40.9  81.1 - 91.4 %   Platelets 253  150 - 400 K/uL   Neutrophils Relative % 80 (*) 43 - 77 %   Neutro Abs 10.6 (*) 1.7 - 7.7 K/uL   Lymphocytes Relative 12  12 - 46 %   Lymphs Abs 1.5  0.7 - 4.0 K/uL   Monocytes Relative 7  3 - 12 %   Monocytes Absolute 0.9  0.1 - 1.0 K/uL   Eosinophils Relative 2  0 - 5 %   Eosinophils Absolute 0.3  0.0 - 0.7 K/uL   Basophils Relative 0  0 - 1 %   Basophils Absolute 0.0  0.0 - 0.1 K/uL  BASIC METABOLIC PANEL      Result Value Ref Range   Sodium 139  137 - 147 mEq/L   Potassium 3.9  3.7 - 5.3 mEq/L   Chloride 96  96 - 112 mEq/L   CO2 29  19 - 32 mEq/L   Glucose, Bld 108 (*) 70 - 99 mg/dL   BUN 32 (*) 6 - 23 mg/dL   Creatinine, Ser 7.82 (*) 0.50 - 1.35 mg/dL   Calcium 9.3  8.4 - 95.6 mg/dL   GFR calc non Af Amer 40 (*) >90 mL/min   GFR calc Af Amer 47 (*) >90 mL/min    Imaging Review No results found.   EKG Interpretation None      MDM   Final diagnoses:  None    1. Left hand cellulitis 2. Left hand abscess  The patient is very well appearing with infection limited to left hand. Given his age, leukocytosis and hand infection that is pregressive, will plan on admission. Discussed with Dr. Ardyth Harps of [facility] who accepts patient for admission. Dr. Janee Morn ([facility]) contacted for consultation on further treatment of hand infection.    Arnoldo Hooker, PA-C 02/13/14 1540

## 2014-02-13 NOTE — ED Provider Notes (Signed)
Medical screening examination/treatment/procedure(s) were conducted as a shared visit with non-physician practitioner(s) and myself.  I personally evaluated the patient during the encounter.  Pt has hand cellulitis with small superficial abscess.  In the ED, appears well however the erythema is up to his wrist.  Plan on I&D , IV abx.  Celene KrasJon R Junella Domke, MD 02/13/14 [phone]

## 2014-02-13 NOTE — H&P (Signed)
[facility]          History and Physical    PCP:   Kathlene November, MD   Chief Complaint:  Left hand swelling and redness  HPI: Patient is a very pleasant 80 year old white man with past medical history significant for atrial fibrillation not maintained on chronic anticoagulation, coronary artery disease, chronic diastolic CHF, hypertension, hyperlipidemia, tachybradycardia syndrome status post permanent pacemaker who presents to the hospital today with the above-mentioned complaints. He states that this began about 2 days ago. He denies any injury or insect bite to the dorsum of his left hand. No fevers or chills. Today when he woke up there was an area overlying the second MCP joint that was grossly swollen and blisterlike. This prompted him to come to the hospital for further  evaluation and management. ED physician performed a bedside I&D, Dr. Grandville Silos with hand surgery has been consulted and will see patient.  Allergies:   Allergies  Allergen Reactions  . Amlodipine Other (See Comments)    Feet swelled  . Atorvastatin Other (See Comments)     leg pain  . Zinc     Scaly rash      Past Medical History  Diagnosis Date  . A-fib     paroxysmal, s/p TEE-DCCV 03/2013;  was on coumadin , pt desired to stop it , no longer a candidate per cards (hemorrhoidal bleeding; falls)  . Tachy-brady syndrome     s/p pacemaker  . CAD (coronary artery disease)     a. MI angioplasty 93, b. s/p CABG 01/2006;   c.  NSTEMI 06/2010 - [facility]:  L-LAD ok, S-Dx ok, S-OM ok, S-PDA ok, small OM sub total occluded - Med Rx  . Chronic diastolic heart failure   . Hyperlipidemia   . Hypertension   . Spinal stenosis      has seen Dr Nelva Bush before, s/p local injection which helped x a while   . GERD (gastroesophageal reflux disease)   . RLS (restless legs syndrome)     ? of  . Insomnia   . History of BPH     TURP '96 at [facility] 96, urethoplast 97 ( History of bladder outlet obstruction, chronic  incontinence)  . Pacemaker   . B12 deficiency anemia   . Myocardial infarction 01/1992; 06/2010  . Arthritis     back  . Hx of cardiovascular stress test     a.  Myoview (10/2005):  EF 62%, mild apical ischemia  . Hx of echocardiogram     a.  Echo (03/23/13):  mod LVH, EF 50-55%, no WMA, mild MR, mod LAE, mild RVE, mod RAE, mod TR  . Atrial flutter     Past Surgical History  Procedure Laterality Date  . Coronary artery bypass graft      "CABG X4" (05/14/2013)  . Cystoscopy with urethral dilatation  1997    "fixed strictures from TURP" (05/14/2013)  . Inguinal hernia repair Right   . Carpal tunnel release Bilateral ~ 2005    Dr Daylene Katayama  . Insert / replace / remove pacemaker  12/2006    PPM [employer]  . Tee without cardioversion N/A 03/27/2013    Procedure: TRANSESOPHAGEAL ECHOCARDIOGRAM (TEE);  Surgeon: Lelon Perla, MD;  Location: [place];  Service: Cardiovascular;  Laterality: N/A;  . Cardioversion N/A 03/27/2013    Procedure: CARDIOVERSION;  Surgeon: Lelon Perla, MD;  Location: [place];  Service: Cardiovascular;  Laterality: N/A;  . Total knee arthroplasty Bilateral  right 2005, Left 2006.  Dr  Alusio  . Coronary angioplasty      "Dr. Stuckey"  . Cataract extraction w/ intraocular lens  implant, bilateral Bilateral 2012  . Transurethral resection of prostate  1996    Prior to Admission medications   Medication Sig Start Date End Date Taking? Authorizing Provider  acetaminophen (TYLENOL) 500 MG tablet Take 500 mg by mouth every 8 (eight) hours as needed for pain.   Yes Historical Provider, MD  aspirin EC 81 MG tablet Take 81 mg by mouth daily.   Yes Historical Provider, MD  B Complex-C (B-COMPLEX WITH VITAMIN C) tablet Take 1 tablet by mouth daily.   Yes Historical Provider, MD  beta carotene w/minerals (OCUVITE) tablet Take 1 tablet by mouth daily.   Yes Historical Provider, MD  carvedilol (COREG) 6.25 MG tablet Take 6.25 mg by mouth 2 (two) times daily with a  meal.   Yes Historical Provider, MD  potassium chloride (MICRO-K) 10 MEQ CR capsule Take 2 capsules by mouth every other day, alternating with 1 capsule daily.   Yes Historical Provider, MD  pramipexole (MIRAPEX) 0.125 MG tablet Take 0.25 mg by mouth at bedtime as needed (restless legs).   Yes Historical Provider, MD  torsemide (DEMADEX) 20 MG tablet Take 20 mg by mouth daily.   Yes Historical Provider, MD    Social History:  reports that he quit smoking about 55 years ago. His smoking use included Cigarettes. He has a 30 pack-year smoking history. He has never used smokeless tobacco. He reports that he does not drink alcohol or use illicit drugs.  Family History  Problem Relation Age of Onset  . Heart disease Neg Hx   . Diabetes Neg Hx     Review of Systems:  Constitutional: Denies fever, chills, diaphoresis, appetite change and fatigue.  HEENT: Denies photophobia, eye pain, redness, hearing loss, ear pain, congestion, sore throat, rhinorrhea, sneezing, mouth sores, trouble swallowing, neck pain, neck stiffness and tinnitus.   Respiratory: Denies SOB, DOE, cough, chest tightness,  and wheezing.   Cardiovascular: Denies chest pain, palpitations and leg swelling.  Gastrointestinal: Denies nausea, vomiting, abdominal pain, diarrhea, constipation, blood in stool and abdominal distention.  Genitourinary: Denies dysuria, urgency, frequency, hematuria, flank pain and difficulty urinating.  Endocrine: Denies: hot or cold intolerance, sweats, changes in hair or nails, polyuria, polydipsia. Musculoskeletal: Denies myalgias, back pain and gait problem.  Skin: Denies pallor, rash and wound.  Neurological: Denies dizziness, seizures, syncope, weakness, light-headedness, numbness and headaches.  Hematological: Denies adenopathy. Easy bruising, personal or family bleeding history  Psychiatric/Behavioral: Denies suicidal ideation, mood changes, confusion, nervousness, sleep disturbance and  agitation   Physical Exam: Blood pressure 120/74, pulse 80, temperature 97.3 F (36.3 C), temperature source Oral, resp. rate 12, weight 93.441 kg (206 lb), SpO2 97.00%. General: Alert, awake, oriented x3, in no distress, pleasant and cooperative with exam. HEENT: Normocephalic, atraumatic, moist mucous membranes. Neck: Supple, no JVD, no lymphadenopathy, no bruits, no goiter. Cardiovascular: Irregular, no murmurs rubs or gallops. Lungs: Clear to auscultation bilaterally. Abdomen: Soft, nontender, nondistended, positive bowel sounds, no masses or organomegaly noted. Extremities: No clubbing, cyanosis or edema, positive pulses. Left hand has already been dressed. Neuro: Grossly intact and nonfocal. I have seen him ambulating with a cane.  Labs on Admission:  Results for orders placed during the hospital encounter of 02/13/14 (from the past 48 hour(s))  CBC WITH DIFFERENTIAL     Status: Abnormal   Collection Time    02/13/14    2:25 PM      Result Value Ref Range   WBC 13.3 (*) 4.0 - 10.5 K/uL   RBC 3.98 (*) 4.22 - 5.81 MIL/uL   Hemoglobin 12.4 (*) 13.0 - 17.0 g/dL   HCT 36.8 (*) 39.0 - 52.0 %   MCV 92.5  78.0 - 100.0 fL   MCH 31.2  26.0 - 34.0 pg   MCHC 33.7  30.0 - 36.0 g/dL   RDW 13.5  11.5 - 15.5 %   Platelets 253  150 - 400 K/uL   Neutrophils Relative % 80 (*) 43 - 77 %   Neutro Abs 10.6 (*) 1.7 - 7.7 K/uL   Lymphocytes Relative 12  12 - 46 %   Lymphs Abs 1.5  0.7 - 4.0 K/uL   Monocytes Relative 7  3 - 12 %   Monocytes Absolute 0.9  0.1 - 1.0 K/uL   Eosinophils Relative 2  0 - 5 %   Eosinophils Absolute 0.3  0.0 - 0.7 K/uL   Basophils Relative 0  0 - 1 %   Basophils Absolute 0.0  0.0 - 0.1 K/uL  BASIC METABOLIC PANEL     Status: Abnormal   Collection Time    02/13/14  2:25 PM      Result Value Ref Range   Sodium 139  137 - 147 mEq/L   Potassium 3.9  3.7 - 5.3 mEq/L   Chloride 96  96 - 112 mEq/L   CO2 29  19 - 32 mEq/L   Glucose, Bld 108 (*) 70 - 99 mg/dL   BUN 32 (*)  6 - 23 mg/dL   Creatinine, Ser 1.47 (*) 0.50 - 1.35 mg/dL   Calcium 9.3  8.4 - 10.5 mg/dL   GFR calc non Af Amer 40 (*) >90 mL/min   GFR calc Af Amer 47 (*) >90 mL/min   Comment: (NOTE)     The eGFR has been calculated using the CKD EPI equation.     This calculation has not been validated in all clinical situations.     eGFR's persistently <90 mL/min signify possible Chronic Kidney     Disease.    Radiological Exams on Admission: No results found.  Assessment/Plan Principal Problem:   Cellulitis and abscess of hand Active Problems:   HYPERTENSION   CAD, ARTERY BYPASS GRAFT   Chronic diastolic heart failure   A-fib   Cellulitis and abscess of left hand -Bedside I and D has been performed by the emergency department physician at the urging of the hand surgeon. -Dr. Grandville Silos, hand surgeon to see patient in consultation. -Is not a diabetic, I believe clindamycin would be sufficient for treatment at this time.  Atrial fibrillation -Rate controlled. -Not candidate for Coumadin given history of rectal bleeding and age.  Chronic diastolic CHF -Compensated at this time  Code Status -Full Code  Time Spent on Admission: 70 minutes  HERNANDEZ ACOSTA,ESTELA [facility] Pager: [phone] 02/13/2014, 6:08 PM

## 2014-02-13 NOTE — ED Provider Notes (Signed)
Per Hand Surgery- ED bedside I&d was performed  INCISION AND DRAINAGE Performed by: Gerhard MunchLOCKWOOD, Aisia Correira Consent: Verbal consent obtained. Risks and benefits: risks, benefits and alternatives were discussed Type: abscess  Body area: dorsum of L hand   Anesthesia: local infiltration  Incision was made with a 11 blade scalpel.  Local anesthetic: lidocaine 1%   Anesthetic total: 3ml   Drainage: purulent - copious  Packing material: 1/4 in iodoform gauze  Patient tolerance: Patient tolerated the procedure well with no immediate complications.     Gerhard Munchobert Halden Phegley, MD 02/13/14 [phone]

## 2014-02-13 NOTE — ED Notes (Signed)
He states he has had a pruritic rash at various areas of his body x ~ 3 weeks which was precipitated by a reaction to the application of an UNA boot.  He is here today with an area of edema and purulence at post. Left hand with surrounding erythema.  He is in no distress and no fever or red streaking noted.

## 2014-02-13 NOTE — ED Notes (Signed)
Gave pt water per PA 

## 2014-02-14 ENCOUNTER — Encounter (HOSPITAL_COMMUNITY): Payer: Self-pay

## 2014-02-14 ENCOUNTER — Other Ambulatory Visit: Payer: Self-pay | Admitting: Internal Medicine

## 2014-02-14 DIAGNOSIS — E876 Hypokalemia: Secondary | ICD-10-CM | POA: Diagnosis present

## 2014-02-14 DIAGNOSIS — N179 Acute kidney failure, unspecified: Secondary | ICD-10-CM

## 2014-02-14 LAB — CBC
HEMATOCRIT: 30.8 % — AB (ref 39.0–52.0)
Hemoglobin: 10.4 g/dL — ABNORMAL LOW (ref 13.0–17.0)
MCH: 31.1 pg (ref 26.0–34.0)
MCHC: 33.8 g/dL (ref 30.0–36.0)
MCV: 92.2 fL (ref 78.0–100.0)
Platelets: 206 10*3/uL (ref 150–400)
RBC: 3.34 MIL/uL — AB (ref 4.22–5.81)
RDW: 13.4 % (ref 11.5–15.5)
WBC: 8.7 10*3/uL (ref 4.0–10.5)

## 2014-02-14 LAB — BASIC METABOLIC PANEL
BUN: 32 mg/dL — ABNORMAL HIGH (ref 6–23)
CO2: 28 mEq/L (ref 19–32)
CREATININE: 1.62 mg/dL — AB (ref 0.50–1.35)
Calcium: 8.4 mg/dL (ref 8.4–10.5)
Chloride: 99 mEq/L (ref 96–112)
GFR calc Af Amer: 41 mL/min — ABNORMAL LOW (ref >90)
GFR, EST NON AFRICAN AMERICAN: 36 mL/min — AB (ref >90)
Glucose, Bld: 106 mg/dL — ABNORMAL HIGH (ref 70–99)
Potassium: 3.5 mEq/L — ABNORMAL LOW (ref 3.7–5.3)
Sodium: 139 mEq/L (ref 137–147)

## 2014-02-14 MED ORDER — SULFAMETHOXAZOLE-TMP DS 800-160 MG PO TABS: 1.0000 | ORAL_TABLET | Freq: Two times a day (BID) | ORAL | Status: DC

## 2014-02-14 MED ORDER — POTASSIUM CHLORIDE CRYS ER 20 MEQ PO TBCR: 20.0000 meq | EXTENDED_RELEASE_TABLET | Freq: Two times a day (BID) | ORAL | Status: DC

## 2014-02-14 MED ORDER — POTASSIUM CHLORIDE CRYS ER 20 MEQ PO TBCR
20.0000 meq | EXTENDED_RELEASE_TABLET | Freq: Two times a day (BID) | ORAL | Status: DC
Start: 2014-02-14 — End: 2014-02-14
  Administered 2014-02-14: 20 meq via ORAL
  Filled 2014-02-14 (×2): qty 1

## 2014-02-14 MED ORDER — ASPIRIN EC 81 MG PO TBEC
81.0000 mg | DELAYED_RELEASE_TABLET | Freq: Every day | ORAL | Status: DC
  Filled 2014-02-14: qty 1

## 2014-02-14 MED ORDER — CEPHALEXIN 500 MG PO CAPS: 500.0000 mg | ORAL_CAPSULE | Freq: Four times a day (QID) | ORAL | Status: DC

## 2014-02-14 NOTE — Discharge Summary (Signed)
Physician Discharge Summary  [name] CoveDwight L [name] UJW:[id number]:[id number] DOB: [birth year] DOA: 02/13/2014  PCP: Willow OraJose Paz, MD  Admit date: 02/13/2014 Discharge date: 02/14/2014  Recommendations for Outpatient Follow-up:  1. Recommend close follow up of renal function. 2. F/U with Dr. Janee Mornhompson in 3-4 days.  Discharge Diagnoses:  Principal Problem:    Cellulitis and abscess of hand Active Problems:    HYPERTENSION    CAD, ARTERY BYPASS GRAFT    Chronic diastolic heart failure    A-fib    Hypokalemia    Acute renal failure   Discharge Condition: Improved.  Diet recommendation: Low sodium, heart healthy.  History of present illness:  [name] is an 80 y.o. male with a PMH of atrial fibrillation (not on chronic anticoagulation), CAD, chronic diastolic CHF, hypertension, hyperlipidemia, tachycardia bradycardia syndrome status post permanent pacemaker who was admitted on 02/13/14 with left hand swelling and erythema consistent with cellulitis. A bedside I & D was performed by the ED physician and hand surgery was consulted.  Hospital Course by problem:  Principal Problem:  Cellulitis and abscess of hand  Status post bedside incision and drainage. Dr. Janee Mornhompson saw the patient this morning and cleared him for discharge.  Will see him in office in 3-4 days to follow up. Unfortunately, it does not appear that cultures were sent from the procedure. Treated with IV Clindamycin while in the hospital, however we'll discharge home on a seven-day course of Bactrim and Keflex to cover MRSA and strep.  Active Problems:  Acute renal failure  Patient's baseline creatinine is 1.1-1.2. Current creatinine 1.6. Hold Demadex for 2 days. Recommend close followup with PCP to reassess renal function. Hypokalemia  Resume potassium supplementation.  HYPERTENSION  Continue carvedilol but hold Demadex given rise in creatinine over baseline values.  CAD, ARTERY BYPASS GRAFT  Continue aspirin and carvedilol.   Chronic diastolic heart failure  Currently compensated.  A-fib  Rate controlled, not a candidate for Coumadin therapy secondary to history of rectal bleeding and advanced age.  Procedures:  Incision and drainage of left hand abscess done by ED physician 02/13/14.  Consultations:  Dr. Mack Hookavid Thompson, Hand Surgery.  Discharge Exam: Filed Vitals:   02/14/14 0759  BP: 108/65  Pulse: 75  Temp:   Resp: 16   Filed Vitals:   02/13/14 1848 02/13/14 2056 02/14/14 0503 02/14/14 0759  BP: 119/65 110/57 102/55 108/65  Pulse: 81 77 75 75  Temp: 98.1 F (36.7 C) 97.9 F (36.6 C) 98.2 F (36.8 C)   TempSrc: Oral Oral Oral   Resp: 16 16 14 16   Weight: 94.9 kg (209 lb 3.5 oz)     SpO2: 100% 100% 96% 98%    Gen:  NAD Cardiovascular:  RRR, No M/R/G Respiratory: Lungs CTAB Gastrointestinal: Abdomen soft, NT/ND with normal active bowel sounds. Extremities: No C/E/C, left hand abscess cavity packed with iodoform.   Discharge Instructions  Discharge Orders   Future Appointments Provider Department Dept Phone   02/19/2014 12:00 PM [facility] [address] Office [phone]   03/15/2014 3:00 PM Tonny BollmanMichael Cooper, MD [facility] [address] [facility] [phone]   05/03/2014 9:50 AM [facility] [address] [facility] [phone]   Future Orders Complete By Expires   Activity as tolerated - No restrictions  As directed    Call MD for:  severe uncontrolled pain  As directed    Call MD for:  temperature >100.4  As directed    Call MD for:  As directed  Scheduling Instructions:     Worsening redness, drainage left hand.   Diet - low sodium heart healthy  As directed    Discharge instructions  As directed    Comments:     Hold your Demedex today and tomorrow, then resume on Tuesday.  The home health RN will remove the packing from your hand wound 02/15/14.  You were cared for by Dr. Hillery Aldo  (a hospitalist) during your hospital stay. If you  have any questions about your discharge medications or the care you received while you were in the hospital after you are discharged, you can call the unit and ask to speak with the hospitalist on call if the hospitalist that took care of you is not available. Once you are discharged, your primary care physician will handle any further medical issues. Please note that NO REFILLS for any discharge medications will be authorized once you are discharged, as it is imperative that you return to your primary care physician (or establish a relationship with a primary care physician if you do not have one) for your aftercare needs so that they can reassess your need for medications and monitor your lab values.  Any outstanding tests can be reviewed by your PCP at your follow up visit.  It is also important to review any medicine changes with your PCP.  Please bring these d/c instructions with you to your next visit so your physician can review these changes with you.  If you do not have a primary care physician, you can call [phone] for a physician referral.  It is highly recommended that you obtain a PCP for hospital follow up.   Face-to-face encounter (required for Medicare/Medicaid patients)  As directed    Comments:     I Kimo Bancroft certify that this patient is under my care and that I, or a nurse practitioner or physician's assistant working with me, had a face-to-face encounter that meets the physician face-to-face encounter requirements with this patient on 02/14/2014. The encounter with the patient was in whole, or in part for the following medical condition(s) which is the primary reason for home health care (List medical condition): Needs packing removed 02/15/14, cover with clean, dry dressing.   Questions:     The encounter with the patient was in whole, or in part, for the following medical condition, which is the primary reason for home health care:  Left hand abscess s/p I&D with packing   I  certify that, based on my findings, the following services are medically necessary home health services:  Nursing   My clinical findings support the need for the above services:  Unable to leave home safely without assistance and/or assistive device   Further, I certify that my clinical findings support that this patient is homebound due to:  Unable to leave home safely without assistance   Reason for Medically Necessary Home Health Services:  Skilled Nursing- Complex Wound Care   Home Health  As directed    Questions:     To provide the following care/treatments:  RN       Medication List         acetaminophen 500 MG tablet  Commonly known as:  TYLENOL  Take 500 mg by mouth every 8 (eight) hours as needed for pain.     aspirin EC 81 MG tablet  Take 81 mg by mouth daily.     B-complex with vitamin C tablet  Take 1 tablet by mouth daily.  beta carotene w/minerals tablet  Take 1 tablet by mouth daily.     carvedilol 6.25 MG tablet  Commonly known as:  COREG  Take 6.25 mg by mouth 2 (two) times daily with a meal.     cephALEXin 500 MG capsule  Commonly known as:  KEFLEX  Take 1 capsule (500 mg total) by mouth 4 (four) times daily.     MICRO-K 10 MEQ CR capsule  Generic drug:  potassium chloride  Take 2 capsules by mouth every other day, alternating with 1 capsule daily.     pramipexole 0.125 MG tablet  Commonly known as:  MIRAPEX  Take 0.25 mg by mouth at bedtime as needed (restless legs).     sulfamethoxazole-trimethoprim 800-160 MG per tablet  Commonly known as:  BACTRIM DS  Take 1 tablet by mouth 2 (two) times daily.     torsemide 20 MG tablet  Commonly known as:  DEMADEX  Take 20 mg by mouth daily.           Follow-up Information   Follow up with Willow Ora, MD. Schedule an appointment as soon as possible for a visit in 1 week. [facility] follow up, lab work needed to check your kidney function.)    Specialty:  Internal Medicine   Contact information:   [phone]  [facility] [address] [place] [address] [phone]       Follow up with Dr. Janee Morn. Schedule an appointment as soon as possible for a visit in 3 days. (To follow up on left hand wound.)        The results of significant diagnostics from this hospitalization (including imaging, microbiology, ancillary and laboratory) are listed below for reference.    Significant Diagnostic Studies: No results found.  Labs:  Basic Metabolic Panel:  Recent Labs Lab 02/11/14 1012 02/13/14 1425 02/14/14 0430  NA 138 139 139  K 4.0 3.9 3.5*  CL 99 96 99  CO2 31 29 28   GLUCOSE 88 108* 106*  BUN 26* 32* 32*  CREATININE 1.6* 1.47* 1.62*  CALCIUM 8.7 9.3 8.4   GFR The CrCl is unknown because both a height and weight (above a minimum accepted value) are required for this calculation.  CBC:  Recent Labs Lab 02/13/14 1425 02/14/14 0430  WBC 13.3* 8.7  NEUTROABS 10.6*  --   HGB 12.4* 10.4*  HCT 36.8* 30.8*  MCV 92.5 92.2  PLT 253 206    Time coordinating discharge: 35 minutes.  Signed:  Sherri Levenhagen  Pager [phone] [facility] 02/14/2014, 10:29 AM

## 2014-02-14 NOTE — Progress Notes (Signed)
   CARE MANAGEMENT NOTE 02/14/2014  Patient:  Zannie CoveWAYNICK,Josedejesus L   Account Number:  [id number]  Date Initiated:  02/14/2014  Documentation initiated by:  [facility]AVIS,Adya Wirz  Subjective/Objective Assessment:   cellulitis     Action/Plan:   lives at home iwth wife   Anticipated DC Date:  02/14/2014   Anticipated DC Plan:  HOME W HOME HEALTH SERVICES      DC Planning Services  CM consult      [facility]  [facility]   Choice offered to / List presented to:  C-1 Patient        HH arranged  HH-1 RN      [facility]  [facility]   Status of service:  Completed, signed off Medicare Important Message given?   (If response is "NO", the following Medicare IM given date fields will be blank) Date Medicare IM given:   Date Additional Medicare IM given:    Discharge Disposition:  HOME W HOME HEALTH SERVICES  Per UR Regulation:    If discussed at Long Length of Stay Meetings, dates discussed:    Comments:  02/14/2014 1120 NCM spoke to pt and gave permission to speak with wife. [facility] Contact[facility] for resumption of care for dressing change needed on 02/15/2014. Isidoro DonningAlesia Mare Ludtke RN CCM Case Mgmt phone [phone]

## 2014-02-14 NOTE — Progress Notes (Signed)
02/14/14  1100  Reviewed discharge with patient and his wife.  Both verbalized understanding of discharge instructions. Copy of discharge papers given to patient.

## 2014-02-14 NOTE — Consult Note (Signed)
ORTHOPAEDIC CONSULTATION HISTORY & PHYSICAL REQUESTING PHYSICIAN: Maryruth Bun Rama, MD  Chief Complaint: left hand abscess  HPI: [name] is a 80 y.o. male who developed a progressive abscess on the dorsum of his left hand, no apparent breech in skin.  Had pain, swelling, spreading redness.  I&D done in ED, packing left, admitted for IV antibiotics  Past Medical History  Diagnosis Date  . A-fib     paroxysmal, s/p TEE-DCCV 03/2013;  was on coumadin , pt desired to stop it , no longer a candidate per cards (hemorrhoidal bleeding; falls)  . Tachy-brady syndrome     s/p pacemaker  . CAD (coronary artery disease)     a. MI angioplasty 93, b. s/p CABG 01/2006;   c.  NSTEMI 06/2010 - [facility]:  L-LAD ok, S-Dx ok, S-OM ok, S-PDA ok, small OM sub total occluded - Med Rx  . Chronic diastolic heart failure   . Hyperlipidemia   . Hypertension   . Spinal stenosis      has seen Dr Ethelene Hal before, s/p local injection which helped x a while   . GERD (gastroesophageal reflux disease)   . RLS (restless legs syndrome)     ? of  . Insomnia   . History of BPH     TURP '96 at [facility] 96, urethoplast 97 ( History of bladder outlet obstruction, chronic incontinence)  . Pacemaker   . B12 deficiency anemia   . Myocardial infarction 01/1992; 06/2010  . Arthritis     back  . Hx of cardiovascular stress test     a.  Myoview (10/2005):  EF 62%, mild apical ischemia  . Hx of echocardiogram     a.  Echo (03/23/13):  mod LVH, EF 50-55%, no WMA, mild MR, mod LAE, mild RVE, mod RAE, mod TR  . Atrial flutter    Past Surgical History  Procedure Laterality Date  . Coronary artery bypass graft      "CABG X4" (05/14/2013)  . Cystoscopy with urethral dilatation  1997    "fixed strictures from TURP" (05/14/2013)  . Inguinal hernia repair Right   . Carpal tunnel release Bilateral ~ 2005    Dr Teressa Senter  . Insert / replace / remove pacemaker  12/2006    PPM [employer]  . Tee without cardioversion N/A 03/27/2013   Procedure: TRANSESOPHAGEAL ECHOCARDIOGRAM (TEE);  Surgeon: Lewayne Bunting, MD;  Location: [facility] ENDOSCOPY;  Service: Cardiovascular;  Laterality: N/A;  . Cardioversion N/A 03/27/2013    Procedure: CARDIOVERSION;  Surgeon: Lewayne Bunting, MD;  Location: [facility] ENDOSCOPY;  Service: Cardiovascular;  Laterality: N/A;  . Total knee arthroplasty Bilateral     right 2005, Left 2006.  Dr  Despina Hick  . Coronary angioplasty      "Dr. Riley Kill"  . Cataract extraction w/ intraocular lens  implant, bilateral Bilateral 2012  . Transurethral resection of prostate  1996   History   Social History  . Marital Status: Married    Spouse Name: N/A    Number of Children: 2  . Years of Education: N/A   Occupational History  . retired     Social History Main Topics  . Smoking status: Former Smoker -- 2.00 packs/day for 15 years    Types: Cigarettes    Quit date: 02/01/1959  . Smokeless tobacco: Never Used  . Alcohol Use: No  . Drug Use: No  . Sexual Activity: Not Currently   Other Topics Concern  . None   Social History Narrative  Lives at home  w/ wife, wife drives    Family History  Problem Relation Age of Onset  . Heart disease Neg Hx   . Diabetes Neg Hx    Allergies  Allergen Reactions  . Amlodipine Other (See Comments)    Feet swelled  . Atorvastatin Other (See Comments)     leg pain  . Zinc     Scaly rash   Prior to Admission medications   Medication Sig Start Date End Date Taking? Authorizing Provider  acetaminophen (TYLENOL) 500 MG tablet Take 500 mg by mouth every 8 (eight) hours as needed for pain.   Yes Historical Provider, MD  aspirin EC 81 MG tablet Take 81 mg by mouth daily.   Yes Historical Provider, MD  B Complex-C (B-COMPLEX WITH VITAMIN C) tablet Take 1 tablet by mouth daily.   Yes Historical Provider, MD  beta carotene w/minerals (OCUVITE) tablet Take 1 tablet by mouth daily.   Yes Historical Provider, MD  carvedilol (COREG) 6.25 MG tablet Take 6.25 mg by mouth 2 (two)  times daily with a meal.   Yes Historical Provider, MD  potassium chloride (MICRO-K) 10 MEQ CR capsule Take 2 capsules by mouth every other day, alternating with 1 capsule daily.   Yes Historical Provider, MD  pramipexole (MIRAPEX) 0.125 MG tablet Take 0.25 mg by mouth at bedtime as needed (restless legs).   Yes Historical Provider, MD  torsemide (DEMADEX) 20 MG tablet Take 20 mg by mouth daily.   Yes Historical Provider, MD  cephALEXin (KEFLEX) 500 MG capsule Take 1 capsule (500 mg total) by mouth 4 (four) times daily. 02/14/14   Maryruth Bunhristina P Rama, MD  sulfamethoxazole-trimethoprim (BACTRIM DS) 800-160 MG per tablet Take 1 tablet by mouth 2 (two) times daily. 02/14/14   Maryruth Bunhristina P Rama, MD   No results found.  Positive ROS: All other systems have been reviewed and were otherwise negative with the exception of those mentioned in the HPI and as above.  Physical Exam: Vitals: Refer to EMR. Constitutional:  WD, WN, NAD HEENT:  NCAT, EOMI Neuro/Psych:  Alert & oriented to person, place, and time; appropriate mood & affect Lymphatic: No generalized extremity edema or lymphadenopathy Extremities / MSK:  The extremities are normal with respect to appearance, ranges of motion, joint stability, muscle strength/tone, sensation, & perfusion except as otherwise noted:  Left hand 1 inch transverse incision over prominence of fluctuance, packing emanating.  NVI, good digital flex/ext.  Minimal expressible drainage  Assessment: Left hand abscess, s/p I&D and 24 hours IV antibiotics  Plan: OK to d/c on oral agents. Instructed patient/family to pull packing tomorrow and re-dress as needed with dry dressing. F/u Wed or Thurs in my office to check progress  [name] A. Janee Mornhompson, MD      Orthopaedic & Hand Surgery [facility] [address] [place], [place]  [id number] Office: [phone] Mobile: [phone]

## 2014-02-14 NOTE — Discharge Instructions (Signed)
Pull the packing Monday Shower normally after that, rinsing the hand in the shower and under running water to cleanse the wound. You may re-dress the wound with a light dry gauze dressing if needed due to continued wound drainage.  No dressing is needed if there is no drainage any more.  Make a full fist and fully straighten your fingers several times daily to prevent your hand from getting stiff    Abscess An abscess is an infected area that contains a collection of pus and debris.It can occur in almost any part of the body. An abscess is also known as a furuncle or boil. CAUSES  An abscess occurs when tissue gets infected. This can occur from blockage of oil or sweat glands, infection of hair follicles, or a minor injury to the skin. As the body tries to fight the infection, pus collects in the area and creates pressure under the skin. This pressure causes pain. People with weakened immune systems have difficulty fighting infections and get certain abscesses more often.  SYMPTOMS Usually an abscess develops on the skin and becomes a painful mass that is red, warm, and tender. If the abscess forms under the skin, you may feel a moveable soft area under the skin. Some abscesses break open (rupture) on their own, but most will continue to get worse without care. The infection can spread deeper into the body and eventually into the bloodstream, causing you to feel ill.  DIAGNOSIS  Your caregiver will take your medical history and perform a physical exam. A sample of fluid may also be taken from the abscess to determine what is causing your infection. TREATMENT  Your caregiver may prescribe antibiotic medicines to fight the infection. However, taking antibiotics alone usually does not cure an abscess. Your caregiver may need to make a small cut (incision) in the abscess to drain the pus. In some cases, gauze is packed into the abscess to reduce pain and to continue draining the area. HOME CARE  INSTRUCTIONS   Only take over-the-counter or prescription medicines for pain, discomfort, or fever as directed by your caregiver.  If you were prescribed antibiotics, take them as directed. Finish them even if you start to feel better.  If gauze is used, follow your caregiver's directions for changing the gauze.  To avoid spreading the infection:  Keep your draining abscess covered with a bandage.  Wash your hands well.  Do not share personal care items, towels, or whirlpools with others.  Avoid skin contact with others.  Keep your skin and clothes clean around the abscess.  Keep all follow-up appointments as directed by your caregiver. SEEK MEDICAL CARE IF:   You have increased pain, swelling, redness, fluid drainage, or bleeding.  You have muscle aches, chills, or a general ill feeling.  You have a fever. MAKE SURE YOU:   Understand these instructions.  Will watch your condition.  Will get help right away if you are not doing well or get worse. Document Released: 08/29/2005 Document Revised: 05/20/2012 Document Reviewed: 02/01/2012 [facility] Patient Information 2014 [employer], [place].

## 2014-02-17 ENCOUNTER — Telehealth: Payer: Self-pay

## 2014-02-17 NOTE — Telephone Encounter (Signed)
Demika with Home Health called stating that the patient was [facility]. He has a wound on his left hand that required an I & D. Surgeon will not prescribe home health to care for the wound.  Spoke with patient who states that he is doing well. He states that he went for follow up this afternoon. Evaluated and dressing change; was advised that it was doing well. States that he and his wife are fine changing the dressings.  LM for Demekia of the above with instructions continue order for leg wound as indicated per Dr Drue NovelPaz ok. (see previous phone message)

## 2014-02-19 ENCOUNTER — Other Ambulatory Visit (INDEPENDENT_AMBULATORY_CARE_PROVIDER_SITE_OTHER): Payer: Medicare Other

## 2014-02-19 DIAGNOSIS — K625 Hemorrhage of anus and rectum: Secondary | ICD-10-CM

## 2014-02-19 DIAGNOSIS — I5032 Chronic diastolic (congestive) heart failure: Secondary | ICD-10-CM

## 2014-02-19 LAB — BASIC METABOLIC PANEL
BUN: 26 mg/dL — ABNORMAL HIGH (ref 6–23)
CO2: 29 meq/L (ref 19–32)
Calcium: 8.9 mg/dL (ref 8.4–10.5)
Chloride: 102 mEq/L (ref 96–112)
Creatinine, Ser: 1.9 mg/dL — ABNORMAL HIGH (ref 0.4–1.5)
GFR: 35.99 mL/min — ABNORMAL LOW (ref >60.00)
Glucose, Bld: 89 mg/dL (ref 70–99)
Potassium: 4.5 mEq/L (ref 3.5–5.1)
SODIUM: 136 meq/L (ref 135–145)

## 2014-02-22 ENCOUNTER — Telehealth: Payer: Self-pay | Admitting: *Deleted

## 2014-02-22 ENCOUNTER — Telehealth: Payer: Self-pay | Admitting: Internal Medicine

## 2014-02-22 ENCOUNTER — Encounter: Payer: Self-pay | Admitting: *Deleted

## 2014-02-22 NOTE — Telephone Encounter (Signed)
pt notified per Bing NeighborsScott W. PA to hold toresemide x 1 day then resume, bmet 3/30.Marland Kitchen.pt verbalized understanding.

## 2014-02-22 NOTE — Telephone Encounter (Signed)
pt notified about lab results. does states he is still on toresemide 20 mg daily, took last dose of bactrim today. Bmet 03/01/14. Pt verbalized understanding to results.

## 2014-02-22 NOTE — Telephone Encounter (Signed)
Patient was recent admitted to the hospital with cellulitis, please check on him, doing ok-better?, needs f/u with us ?

## 2014-02-23 NOTE — Telephone Encounter (Signed)
Spoke with spouse who states that the Home Health nurse is in the home today changing the dressings. Patient is doing fine; lots better. He is to follow up with Dr Janee Mornhompson on April 8. Spouse will be in [place] tomorrow.

## 2014-02-23 NOTE — Telephone Encounter (Signed)
thx

## 2014-02-25 ENCOUNTER — Telehealth: Payer: Self-pay

## 2014-02-25 NOTE — Telephone Encounter (Signed)
[facility]  SN requesting orders to extend visits to continue wound care to non-healing ulcer to left lower leg.  Order requested placed in Dr. Leta JunglingPaz's red folder.

## 2014-03-01 ENCOUNTER — Ambulatory Visit (INDEPENDENT_AMBULATORY_CARE_PROVIDER_SITE_OTHER): Payer: Medicare Other | Admitting: *Deleted

## 2014-03-01 DIAGNOSIS — I10 Essential (primary) hypertension: Secondary | ICD-10-CM

## 2014-03-01 LAB — BASIC METABOLIC PANEL
BUN: 24 mg/dL — AB (ref 6–23)
CHLORIDE: 102 meq/L (ref 96–112)
CO2: 26 mEq/L (ref 19–32)
CREATININE: 1.3 mg/dL (ref 0.4–1.5)
Calcium: 8.6 mg/dL (ref 8.4–10.5)
GFR: 55.58 mL/min — ABNORMAL LOW (ref >60.00)
Glucose, Bld: 93 mg/dL (ref 70–99)
Potassium: 4.5 mEq/L (ref 3.5–5.1)
Sodium: 135 mEq/L (ref 135–145)

## 2014-03-02 ENCOUNTER — Encounter: Payer: Self-pay | Admitting: Internal Medicine

## 2014-03-15 ENCOUNTER — Ambulatory Visit (INDEPENDENT_AMBULATORY_CARE_PROVIDER_SITE_OTHER): Payer: Medicare Other | Admitting: Cardiovascular Disease

## 2014-03-15 ENCOUNTER — Encounter: Payer: Self-pay | Admitting: Cardiovascular Disease

## 2014-03-15 VITALS — BP 140/80 | HR 75 | Ht 68.0 in | Wt 219.0 lb

## 2014-03-15 DIAGNOSIS — I5032 Chronic diastolic (congestive) heart failure: Secondary | ICD-10-CM

## 2014-03-15 DIAGNOSIS — R609 Edema, unspecified: Secondary | ICD-10-CM

## 2014-03-15 DIAGNOSIS — R600 Localized edema: Secondary | ICD-10-CM

## 2014-03-15 DIAGNOSIS — I2510 Atherosclerotic heart disease of native coronary artery without angina pectoris: Secondary | ICD-10-CM

## 2014-03-15 MED ORDER — POTASSIUM CHLORIDE ER 10 MEQ PO CPCR: ORAL_CAPSULE | ORAL | Status: DC

## 2014-03-15 NOTE — Patient Instructions (Addendum)
Order given to the patient for light, thigh high compression stockings.  Please take Torsemide 40mg  daily for the next 2 days and then go back to 20mg  daily.  You can take an extra torsemide 20mg  for a weight gain of greater than 3 lbs in a 24 hour period or 5 lbs from baseline weight.  Your physician wants you to follow-up in: 6 MONTHS with Dr Excell Seltzerooper.  You will receive a reminder letter in the mail two months in advance. If you don't receive a letter, please call our office to schedule the follow-up appointment.

## 2014-03-15 NOTE — Progress Notes (Signed)
HPI:   [name] is a 80 y.o. male with a hx of CAD, s/p CABG in 2007, atrial fibrillation/flutter, tachybradycardia syndrome, s/p pacemaker, diastolic CHF, HTN, HL, bladder outlet obstruction 2/2 to urethral stricture from TURP many years ago, orthostatic hypotension with syncope and recurrent falls. Last cath in 06/2010 in setting of NSTEMI demonstrated patent grafts and subtotal occlusion of marginal branch treated medically. He is not on anticoagulation due to hemorrhoidal bleeding and frequent falls (Xarelto stopped in 2014). His diuretics were recently changed to torsemide because of refractory edema. His creatinine initially climbed to 1.9 and it has now dropped to 1.3.  He continues to complain of leg swelling. Otherwise doing well. He does not add salt to his food. Admits to be very sedentary. Has adjusted torsemide to treat fluid overload/swelling. No chest pain. Breathing is stable and he denies shortness of breath at rest. No orthopnea or PND.    Outpatient Encounter Prescriptions as of 03/15/2014  Medication Sig  . acetaminophen (TYLENOL) 500 MG tablet Take 500 mg by mouth every 8 (eight) hours as needed for pain.  Marland Kitchen. aspirin EC 81 MG tablet Take 81 mg by mouth daily.  . B Complex-C (B-COMPLEX WITH VITAMIN C) tablet Take 1 tablet by mouth daily.  . beta carotene w/minerals (OCUVITE) tablet Take 1 tablet by mouth daily.  . carvedilol (COREG) 6.25 MG tablet Take 6.25 mg by mouth 2 (two) times daily with a meal.  . potassium chloride (MICRO-K) 10 MEQ CR capsule daily  . pramipexole (MIRAPEX) 0.125 MG tablet Take 0.25 mg by mouth at bedtime as needed (restless legs).  . torsemide (DEMADEX) 20 MG tablet Take 20 mg by mouth daily.  . [DISCONTINUED] potassium chloride (MICRO-K) 10 MEQ CR capsule TAKE 1 CAPSULE TWICE DAILY 3 TO 4 TIMES A WEEK ALL OTHER DAYS TAKE 1 CAPSULE  . [DISCONTINUED] cephALEXin (KEFLEX) 500 MG capsule Take 1 capsule (500 mg total) by mouth 4 (four) times daily.    . [DISCONTINUED] potassium chloride (MICRO-K) 10 MEQ CR capsule Take 2 capsules by mouth every other day, alternating with 1 capsule daily.  . [DISCONTINUED] sulfamethoxazole-trimethoprim (BACTRIM DS) 800-160 MG per tablet Take 1 tablet by mouth 2 (two) times daily.    Allergies  Allergen Reactions  . Amlodipine Other (See Comments)    Feet swelled  . Atorvastatin Other (See Comments)     leg pain  . Zinc     Scaly rash    Past Medical History  Diagnosis Date  . A-fib     paroxysmal, s/p TEE-DCCV 03/2013;  was on coumadin , pt desired to stop it , no longer a candidate per cards (hemorrhoidal bleeding; falls)  . Tachy-brady syndrome     s/p pacemaker  . CAD (coronary artery disease)     a. MI angioplasty 93, b. s/p CABG 01/2006;   c.  NSTEMI 06/2010 - [facility]:  L-LAD ok, S-Dx ok, S-OM ok, S-PDA ok, small OM sub total occluded - Med Rx  . Chronic diastolic heart failure   . Hyperlipidemia   . Hypertension   . Spinal stenosis      has seen Dr Ethelene Halamos before, s/p local injection which helped x a while   . GERD (gastroesophageal reflux disease)   . RLS (restless legs syndrome)     ? of  . Insomnia   . History of BPH     TURP '96 at [facility] 96, urethoplast 97 ( History of bladder outlet obstruction, chronic incontinence)  .  Pacemaker   . B12 deficiency anemia   . Myocardial infarction 01/1992; 06/2010  . Arthritis     back  . Hx of cardiovascular stress test     a.  Myoview (10/2005):  EF 62%, mild apical ischemia  . Hx of echocardiogram     a.  Echo (03/23/13):  mod LVH, EF 50-55%, no WMA, mild MR, mod LAE, mild RVE, mod RAE, mod TR  . Atrial flutter     ROS: Negative except as per HPI  BP 140/80  Pulse 75  Ht 5\' 8"  (1.727 m)  Wt 219 lb (99.338 kg)  BMI 33.31 kg/m2  PHYSICAL EXAM: Pt is alert and oriented, pleasant elderly male in NAD HEENT: normal Neck: JVP - normal, carotids 2+= without bruits Lungs: CTA bilaterally CV: RRR without murmur or gallop Abd: soft, NT, Positive  BS, no hepatomegaly Ext: 3+ right pretibial and 2+ left pretibial edema, distal pulses intact and equal Skin: warm/dry no rash  EKG:  AV sequential pacing  ASSESSMENT AND PLAN: 1. Edema, suspect multifactorial (venous insufficiency, diastolic CHF, dependent edema from sitting/sedentary lifestyle). Recommend increase torsemide to 40 mg x 2 days, then resume 20 mg. Careful balance with his CKD. Take additional torsemide as per sliding scale.  2. Chronic diastolic HF - as above. BP controlled.   3. AFib/flutter - not an anticoagulation candidate. Maintaining sinus (AV pacing)  4. Hyperlipidemia: statin intolerant.  5. HTN -controlled  Will see back in 6 months for follow-up.  Tonny BollmanMichael Jullia Mulligan 03/15/2014 3:25 PM

## 2014-03-17 ENCOUNTER — Encounter: Payer: Self-pay | Admitting: Cardiovascular Disease

## 2014-03-29 ENCOUNTER — Telehealth: Payer: Self-pay | Admitting: *Deleted

## 2014-03-29 NOTE — Telephone Encounter (Signed)
Received home health certification and [facility] of [facility] for [facility]. Billing sheet attached, medications reviewed and placed in folder for Dr. Drue NovelPaz. JG//CMA

## 2014-03-31 DIAGNOSIS — L02519 Cutaneous abscess of unspecified hand: Secondary | ICD-10-CM

## 2014-03-31 DIAGNOSIS — S42302A Unspecified fracture of shaft of humerus, left arm, initial encounter for closed fracture: Secondary | ICD-10-CM

## 2014-03-31 DIAGNOSIS — S42301A Unspecified fracture of shaft of humerus, right arm, initial encounter for closed fracture: Secondary | ICD-10-CM

## 2014-03-31 DIAGNOSIS — S2220XA Unspecified fracture of sternum, initial encounter for closed fracture: Secondary | ICD-10-CM

## 2014-03-31 DIAGNOSIS — S2249XA Multiple fractures of ribs, unspecified side, initial encounter for closed fracture: Secondary | ICD-10-CM

## 2014-03-31 DIAGNOSIS — L03119 Cellulitis of unspecified part of limb: Secondary | ICD-10-CM

## 2014-03-31 NOTE — Telephone Encounter (Signed)
Received signed forms. [facility] at [phone]. JG//CMA

## 2014-04-04 ENCOUNTER — Other Ambulatory Visit: Payer: Self-pay | Admitting: Internal Medicine

## 2014-04-22 ENCOUNTER — Other Ambulatory Visit: Payer: Self-pay | Admitting: Cardiovascular Disease

## 2014-05-03 ENCOUNTER — Ambulatory Visit (INDEPENDENT_AMBULATORY_CARE_PROVIDER_SITE_OTHER): Payer: Medicare Other | Admitting: *Deleted

## 2014-05-03 DIAGNOSIS — I4729 Other ventricular tachycardia: Secondary | ICD-10-CM

## 2014-05-03 DIAGNOSIS — I472 Ventricular tachycardia: Secondary | ICD-10-CM

## 2014-05-03 DIAGNOSIS — I4891 Unspecified atrial fibrillation: Secondary | ICD-10-CM

## 2014-05-03 DIAGNOSIS — I495 Sick sinus syndrome: Secondary | ICD-10-CM

## 2014-05-03 LAB — MDC_IDC_ENUM_SESS_TYPE_REMOTE
Battery Voltage: 2.69 V
Brady Statistic AP VS Percent: 0 %
Brady Statistic AS VP Percent: 12 %
Brady Statistic AS VS Percent: 0 %
Date Time Interrogation Session: 20150601130329
Lead Channel Impedance Value: 444 Ohm
Lead Channel Pacing Threshold Amplitude: 0.5 V
Lead Channel Pacing Threshold Amplitude: 0.625 V
Lead Channel Pacing Threshold Pulse Width: 0.4 ms
Lead Channel Setting Pacing Amplitude: 2 V
MDC IDC MSMT BATTERY IMPEDANCE: 2788 Ohm
MDC IDC MSMT BATTERY REMAINING LONGEVITY: 13 mo
MDC IDC MSMT LEADCHNL RA IMPEDANCE VALUE: 442 Ohm
MDC IDC MSMT LEADCHNL RA PACING THRESHOLD PULSEWIDTH: 0.4 ms
MDC IDC SET LEADCHNL RV PACING AMPLITUDE: 2.5 V
MDC IDC SET LEADCHNL RV PACING PULSEWIDTH: 0.4 ms
MDC IDC SET LEADCHNL RV SENSING SENSITIVITY: 1.4 mV
MDC IDC STAT BRADY AP VP PERCENT: 88 %

## 2014-05-03 NOTE — Progress Notes (Signed)
Remote pacemaker transmission.   

## 2014-05-13 ENCOUNTER — Ambulatory Visit (INDEPENDENT_AMBULATORY_CARE_PROVIDER_SITE_OTHER): Payer: Medicare Other | Admitting: Family Medicine

## 2014-05-13 ENCOUNTER — Encounter: Payer: Self-pay | Admitting: Family Medicine

## 2014-05-13 VITALS — BP 120/74 | HR 88 | Temp 98.1°F | Wt 210.0 lb

## 2014-05-13 DIAGNOSIS — Z2089 Contact with and (suspected) exposure to other communicable diseases: Secondary | ICD-10-CM

## 2014-05-13 DIAGNOSIS — Z207 Contact with and (suspected) exposure to pediculosis, acariasis and other infestations: Secondary | ICD-10-CM

## 2014-05-13 DIAGNOSIS — R21 Rash and other nonspecific skin eruption: Secondary | ICD-10-CM

## 2014-05-13 MED ORDER — PERMETHRIN 5 % EX CREA: 1.0000 "application " | TOPICAL_CREAM | Freq: Once | CUTANEOUS | Status: DC

## 2014-05-13 MED ORDER — PREDNISONE 10 MG PO TABS: ORAL_TABLET | ORAL | Status: DC

## 2014-05-13 NOTE — Progress Notes (Signed)
   Subjective:    Patient ID: [name], male    DOB: 23-Dec-1920, 80 y.o.   MRN: [id number]  HPI + papular rash across chest, face and neck.  + pruritic Pt wife was treated yesterday for scabies.     Review of Systems As above    Objective:   Physical Exam BP 120/74  Pulse 88  Temp(Src) 98.1 F (36.7 C) (Oral)  Wt 210 lb (95.255 kg)  SpO2 97% General appearance: alert, cooperative, appears stated age and no distress Skin: papular - face, neck, chest        Assessment & Plan:  1. Rash and nonspecific skin eruption  - predniSONE (DELTASONE) 10 MG tablet; 3 po qd for 3 days then 2 po qd for 3 days the 1 po qd for 3 days  Dispense: 18 tablet; Refill: 0  2. Scabies exposure Wife was already treated - permethrin (ELIMITE) 5 % cream; Apply 1 application topically once.  Dispense: 60 g; Refill: 0

## 2014-05-13 NOTE — Patient Instructions (Signed)
Scabies  Scabies are small bugs (mites) that burrow under the skin and cause red bumps and severe itching. These bugs can only be seen with a microscope. Scabies are highly contagious. They can spread easily from person to person by direct contact. They are also spread through sharing clothing or linens that have the scabies mites living in them. It is not unusual for an entire family to become infected through shared towels, clothing, or bedding.   HOME CARE INSTRUCTIONS   · Your caregiver may prescribe a cream or lotion to kill the mites. If cream is prescribed, massage the cream into the entire body from the neck to the bottom of both feet. Also massage the cream into the scalp and face if your child is less than 1 year old. Avoid the eyes and mouth. Do not wash your hands after application.  · Leave the cream on for 8 to 12 hours. Your child should bathe or shower after the 8 to 12 hour application period. Sometimes it is helpful to apply the cream to your child right before bedtime.  · One treatment is usually effective and will eliminate approximately 95% of infestations. For severe cases, your caregiver may decide to repeat the treatment in 1 week. Everyone in your household should be treated with one application of the cream.  · New rashes or burrows should not appear within 24 to 48 hours after successful treatment. However, the itching and rash may last for 2 to 4 weeks after successful treatment. Your caregiver may prescribe a medicine to help with the itching or to help the rash go away more quickly.  · Scabies can live on clothing or linens for up to 3 days. All of your child's recently used clothing, towels, stuffed toys, and bed linens should be washed in hot water and then dried in a dryer for at least 20 minutes on high heat. Items that cannot be washed should be enclosed in a plastic bag for at least 3 days.  · To help relieve itching, bathe your child in a cool bath or apply cool washcloths to the  affected areas.  · Your child may return to school after treatment with the prescribed cream.  SEEK MEDICAL CARE IF:   · The itching persists longer than 4 weeks after treatment.  · The rash spreads or becomes infected. Signs of infection include red blisters or yellow-tan crust.  Document Released: 11/19/2005 Document Revised: 02/11/2012 Document Reviewed: 03/30/2009  ExitCare® Patient Information ©2014 [employer], LLC.

## 2014-05-13 NOTE — Progress Notes (Signed)
Pre visit review using our clinic review tool, if applicable. No additional management support is needed unless otherwise documented below in the visit note. 

## 2014-06-02 ENCOUNTER — Encounter: Payer: Self-pay | Admitting: Cardiology

## 2014-06-23 ENCOUNTER — Encounter: Payer: Self-pay | Admitting: Internal Medicine

## 2014-07-01 ENCOUNTER — Other Ambulatory Visit: Payer: Self-pay | Admitting: Internal Medicine

## 2014-07-15 ENCOUNTER — Encounter: Payer: Self-pay | Admitting: Internal Medicine

## 2014-07-15 ENCOUNTER — Ambulatory Visit (INDEPENDENT_AMBULATORY_CARE_PROVIDER_SITE_OTHER): Payer: Medicare Other | Admitting: Internal Medicine

## 2014-07-15 VITALS — BP 111/64 | HR 82 | Temp 97.5°F | Ht 67.0 in | Wt 220.1 lb

## 2014-07-15 DIAGNOSIS — I10 Essential (primary) hypertension: Secondary | ICD-10-CM

## 2014-07-15 DIAGNOSIS — R609 Edema, unspecified: Secondary | ICD-10-CM

## 2014-07-15 DIAGNOSIS — R600 Localized edema: Secondary | ICD-10-CM

## 2014-07-15 DIAGNOSIS — K625 Hemorrhage of anus and rectum: Secondary | ICD-10-CM

## 2014-07-15 DIAGNOSIS — Z23 Encounter for immunization: Secondary | ICD-10-CM

## 2014-07-15 DIAGNOSIS — E785 Hyperlipidemia, unspecified: Secondary | ICD-10-CM

## 2014-07-15 DIAGNOSIS — Z0000 Encounter for general adult medical examination without abnormal findings: Secondary | ICD-10-CM

## 2014-07-15 DIAGNOSIS — R7309 Other abnormal glucose: Secondary | ICD-10-CM

## 2014-07-15 LAB — HEMOGLOBIN A1C: Hgb A1c MFr Bld: 5.7 % (ref 4.6–6.5)

## 2014-07-15 LAB — CBC WITH DIFFERENTIAL/PLATELET
Basophils Absolute: 0 10*3/uL (ref 0.0–0.1)
Basophils Relative: 0.4 % (ref 0.0–3.0)
EOS ABS: 0.3 10*3/uL (ref 0.0–0.7)
Eosinophils Relative: 5.3 % — ABNORMAL HIGH (ref 0.0–5.0)
HEMATOCRIT: 34.1 % — AB (ref 39.0–52.0)
HEMOGLOBIN: 11.6 g/dL — AB (ref 13.0–17.0)
LYMPHS ABS: 1.1 10*3/uL (ref 0.7–4.0)
Lymphocytes Relative: 19.3 % (ref 12.0–46.0)
MCHC: 34 g/dL (ref 30.0–36.0)
MCV: 91 fl (ref 78.0–100.0)
Monocytes Absolute: 0.6 10*3/uL (ref 0.1–1.0)
Monocytes Relative: 10.1 % (ref 3.0–12.0)
NEUTROS ABS: 3.8 10*3/uL (ref 1.4–7.7)
Neutrophils Relative %: 64.9 % (ref 43.0–77.0)
Platelets: 203 10*3/uL (ref 150.0–400.0)
RBC: 3.75 Mil/uL — ABNORMAL LOW (ref 4.22–5.81)
RDW: 14.1 % (ref 11.5–15.5)
WBC: 5.9 10*3/uL (ref 4.0–10.5)

## 2014-07-15 LAB — BASIC METABOLIC PANEL
BUN: 23 mg/dL (ref 6–23)
CO2: 31 meq/L (ref 19–32)
CREATININE: 1.3 mg/dL (ref 0.4–1.5)
Calcium: 8.8 mg/dL (ref 8.4–10.5)
Chloride: 103 mEq/L (ref 96–112)
GFR: 53.15 mL/min — ABNORMAL LOW (ref >60.00)
Glucose, Bld: 96 mg/dL (ref 70–99)
Potassium: 4.1 mEq/L (ref 3.5–5.1)
Sodium: 138 mEq/L (ref 135–145)

## 2014-07-15 LAB — TSH: TSH: 0.81 u[IU]/mL (ref 0.35–4.50)

## 2014-07-15 LAB — IRON: Iron: 84 ug/dL (ref 42–165)

## 2014-07-15 LAB — FERRITIN: Ferritin: 36 ng/mL (ref 22.0–322.0)

## 2014-07-15 NOTE — Assessment & Plan Note (Signed)
Under excellent control, no change 

## 2014-07-15 NOTE — Assessment & Plan Note (Signed)
Intolerant to statins. 

## 2014-07-15 NOTE — Progress Notes (Signed)
Subjective:    Patient ID: [name], male    DOB: 01/16/1921, 80 y.o.   MRN: [id number]  DOS:  07/15/2014 Type of visit - description:   Here for Medicare AWV:  1. Risk factors based on Past M, S, F history: reviewed 2. Physical Activities:  sedentary  3. Depression/mood:  No problems reported or noted   4. Hearing:  Has difficulty w/ hearing, saw the specialist,aids are $$$ 5. ADL's:  not driving, able to dress and take showers w/o help   6. Fall Risk: no recent falls but high risk 7. home Safety: does feel safe at home   8. Height, weight, &visual acuity: see VS,s/p cataract surgery, vision still not very good  9. Counseling: provided 10. Labs ordered based on risk factors: if needed   11. Referral Coordination: if needed 12.  Care Plan, see assessment and plan   13.   Cognitive Assessment: cognition normal, motor skills poor but probably appropriate for age  In addition, today we discussed the following: Edema, good compliance with diuretics, doing better RLS-- on  Mirapex, still have symptoms on and off. Atrial fibrillation, good compliance with aspirin, not a candidate for other rx History of hemorrhoids, continue with almost daily bleeding, few drops in the toilet paper, blood is fresh and red ; this is going on for years, symptoms not worse   ROS In general feels better than previous months, appetite is very good No chest pain, DOE at baseline. No nausea, vomiting, diarrhea. Some difficulty urinating, and baseline, no dysuria or gross nocturia  Past Medical History  Diagnosis Date  . A-fib     paroxysmal, s/p TEE-DCCV 03/2013;  was on coumadin , pt desired to stop it , no longer a candidate per cards (hemorrhoidal bleeding; falls)  . Tachy-brady syndrome     s/p pacemaker  . CAD (coronary artery disease)     a. MI angioplasty 93, b. s/p CABG 01/2006;   c.  NSTEMI 06/2010 - [facility]:  L-LAD ok, S-Dx ok, S-OM ok, S-PDA ok, small OM sub total occluded - Med Rx  .  Chronic diastolic heart failure   . Hyperlipidemia   . Hypertension   . Spinal stenosis      has seen Dr Ethelene Hal before, s/p local injection which helped x a while   . GERD (gastroesophageal reflux disease)   . RLS (restless legs syndrome)     ? of  . Insomnia   . History of BPH     TURP '96 at [facility] 96, urethoplast 97 ( History of bladder outlet obstruction, chronic incontinence)  . Pacemaker   . B12 deficiency anemia   . Myocardial infarction 01/1992; 06/2010  . Arthritis     back  . Hx of cardiovascular stress test     a.  Myoview (10/2005):  EF 62%, mild apical ischemia  . Hx of echocardiogram     a.  Echo (03/23/13):  mod LVH, EF 50-55%, no WMA, mild MR, mod LAE, mild RVE, mod RAE, mod TR  . Atrial flutter     Past Surgical History  Procedure Laterality Date  . Coronary artery bypass graft      "CABG X4" (05/14/2013)  . Cystoscopy with urethral dilatation  1997    "fixed strictures from TURP" (05/14/2013)  . Inguinal hernia repair Right   . Carpal tunnel release Bilateral ~ 2005    Dr Teressa Senter  . Insert / replace / remove pacemaker  12/2006  PPM Medtronic  . Tee without cardioversion N/A 03/27/2013    Procedure: TRANSESOPHAGEAL ECHOCARDIOGRAM (TEE);  Surgeon: Lewayne BuntingBrian S Crenshaw, MD;  [facility];  Service: Cardiovascular;  Laterality: N/A;  . Cardioversion N/A 03/27/2013    Procedure: CARDIOVERSION;  Surgeon: Lewayne BuntingBrian S Crenshaw, MD;  [facility];  Service: Cardiovascular;  Laterality: N/A;  . Total knee arthroplasty Bilateral     right 2005, Left 2006.  Dr  Despina HickAlusio  . Coronary angioplasty      "Dr. Riley KillStuckey"  . Cataract extraction w/ intraocular lens  implant, bilateral Bilateral 2012  . Transurethral resection of prostate  1996    History   Social History  . Marital Status: Married    Spouse Name: N/A    Number of Children: 2  . Years of Education: N/A   Occupational History  . retired     Social History Main Topics  . Smoking status: Former Smoker  -- 2.00 packs/day for 15 years    Types: Cigarettes    Quit date: 02/01/1959  . Smokeless tobacco: Never Used  . Alcohol Use: No  . Drug Use: No  . Sexual Activity: Not Currently   Other Topics Concern  . Not on file   Social History Narrative   Lives at home  w/ wife, wife drives      Family History  Problem Relation Age of Onset  . Heart disease Neg Hx   . Diabetes Neg Hx        Medication List       This list is accurate as of: 07/15/14  2:39 PM.  Always use your most recent med list.               acetaminophen 500 MG tablet  Commonly known as:  TYLENOL  Take 500 mg by mouth every 8 (eight) hours as needed for pain.     aspirin EC 81 MG tablet  Take 81 mg by mouth daily.     B-complex with vitamin C tablet  Take 1 tablet by mouth daily.     beta carotene w/minerals tablet  Take 1 tablet by mouth daily.     potassium chloride 10 MEQ CR capsule  Commonly known as:  MICRO-K  Take one tablet by mouth daily alternating with two tablets by mouth daily     pramipexole 0.125 MG tablet  Commonly known as:  MIRAPEX  TAKE 1 TO 2 TABLETS BY MOUTH AT BEDTIME AS NEEDED FOR RESTLESS LEGS     torsemide 20 MG tablet  Commonly known as:  DEMADEX  Take 20 mg by mouth daily.           Objective:   Physical Exam BP 111/64  Pulse 82  Temp(Src) 97.5 F (36.4 C) (Oral)  Ht 5\' 7"  (1.702 m)  Wt 220 lb 2 oz (99.848 kg)  BMI 34.47 kg/m2  SpO2 93% General -- alert, well-developed, NAD.  Neck --no thyromegaly   HEENT-- Not pale.   Lungs -- normal respiratory effort, no intercostal retractions, no accessory muscle use, and normal breath sounds.  Heart-- seems regular today.  Abdomen-- Not distended, good bowel sounds,soft, non-tender.  Extremities-- +/+++ pretibial edema bilaterally  Neurologic--  alert & oriented X3. Speech normal, gait difficult , uses a cane; , strength symmetric and appropriate for age.  Psych-- Cognition and judgment appear intact. Cooperative  with normal attention span and concentration. No anxious or depressed appearing.     Assessment & Plan:

## 2014-07-15 NOTE — Assessment & Plan Note (Signed)
Much improved, continue with present care

## 2014-07-15 NOTE — Assessment & Plan Note (Signed)
Will check CBC, iron and ferritin. Bleeding is  stable over time , like;ly from hemorrhoids

## 2014-07-15 NOTE — Patient Instructions (Signed)
Get your blood work before you leave     Next visit is for routine check up in 4-6 months No need to come back fasting Please make an appointment    Fall Prevention and Home Safety Falls cause injuries and can affect all age groups. It is possible to use preventive measures to significantly decrease the likelihood of falls. There are many simple measures which can make your home safer and prevent falls. OUTDOORS  Repair cracks and edges of walkways and driveways.  Remove high doorway thresholds.  Trim shrubbery on the main path into your home.  Have good outside lighting.  Clear walkways of tools, rocks, debris, and clutter.  Check that handrails are not broken and are securely fastened. Both sides of steps should have handrails.  Have leaves, snow, and ice cleared regularly.  Use sand or salt on walkways during winter months.  In the garage, clean up grease or oil spills. BATHROOM  Install night lights.  Install grab bars by the toilet and in the tub and shower.  Use non-skid mats or decals in the tub or shower.  Place a plastic non-slip stool in the shower to sit on, if needed.  Keep floors dry and clean up all water on the floor immediately.  Remove soap buildup in the tub or shower on a regular basis.  Secure bath mats with non-slip, double-sided rug tape.  Remove throw rugs and tripping hazards from the floors. BEDROOMS  Install night lights.  Make sure a bedside light is easy to reach.  Do not use oversized bedding.  Keep a telephone by your bedside.  Have a firm chair with side arms to use for getting dressed.  Remove throw rugs and tripping hazards from the floor. KITCHEN  Keep handles on pots and pans turned toward the center of the stove. Use back burners when possible.  Clean up spills quickly and allow time for drying.  Avoid walking on wet floors.  Avoid hot utensils and knives.  Position shelves so they are not too high or low.  Place  commonly used objects within easy reach.  If necessary, use a sturdy step stool with a grab bar when reaching.  Keep electrical cables out of the way.  Do not use floor polish or wax that makes floors slippery. If you must use wax, use non-skid floor wax.  Remove throw rugs and tripping hazards from the floor. STAIRWAYS  Never leave objects on stairs.  Place handrails on both sides of stairways and use them. Fix any loose handrails. Make sure handrails on both sides of the stairways are as long as the stairs.  Check carpeting to make sure it is firmly attached along stairs. Make repairs to worn or loose carpet promptly.  Avoid placing throw rugs at the top or bottom of stairways, or properly secure the rug with carpet tape to prevent slippage. Get rid of throw rugs, if possible.  Have an electrician put in a light switch at the top and bottom of the stairs. OTHER FALL PREVENTION TIPS  Wear low-heel or rubber-soled shoes that are supportive and fit well. Wear closed toe shoes.  When using a stepladder, make sure it is fully opened and both spreaders are firmly locked. Do not climb a closed stepladder.  Add color or contrast paint or tape to grab bars and handrails in your home. Place contrasting color strips on first and last steps.  Learn and use mobility aids as needed. Install an electrical emergency response  system.  Turn on lights to avoid dark areas. Replace light bulbs that burn out immediately. Get light switches that glow.  Arrange furniture to create clear pathways. Keep furniture in the same place.  Firmly attach carpet with non-skid or double-sided tape.  Eliminate uneven floor surfaces.  Select a carpet pattern that does not visually hide the edge of steps.  Be aware of all pets. OTHER HOME SAFETY TIPS  Set the water temperature for 120 F (48.8 C).  Keep emergency numbers on or near the telephone.  Keep smoke detectors on every level of the home and near  sleeping areas. Document Released: 11/09/2002 Document Revised: 05/20/2012 Document Reviewed: 02/08/2012 [facility] Patient Information 2015 [place], [place]. This information is not intended to replace advice given to you by your health care provider. Make sure you discuss any questions you have with your health care provider.

## 2014-07-15 NOTE — Progress Notes (Signed)
Pre-visit discussion using our clinic review tool. No additional management support is needed unless otherwise documented below in the visit note.  

## 2014-07-15 NOTE — Assessment & Plan Note (Signed)
Recheck, A1c

## 2014-07-15 NOTE — Assessment & Plan Note (Addendum)
Td 09 and 05-2010 pneumonia shot 06 and 05-2010 prevnar-- today Discussed shingles   no further colon of prstate ca screening, see previous entry  Diet and exercise discussed All recent labs reviewed, creatinine was slightly elevated and hemoglobin is slightly decreased last month. Re-assess on return to the office

## 2014-07-27 ENCOUNTER — Encounter: Payer: Self-pay | Admitting: Internal Medicine

## 2014-07-27 ENCOUNTER — Ambulatory Visit (INDEPENDENT_AMBULATORY_CARE_PROVIDER_SITE_OTHER): Payer: Medicare Other | Admitting: Internal Medicine

## 2014-07-27 VITALS — BP 154/83 | HR 79 | Ht 67.0 in | Wt 219.2 lb

## 2014-07-27 DIAGNOSIS — I480 Paroxysmal atrial fibrillation: Secondary | ICD-10-CM

## 2014-07-27 DIAGNOSIS — I4729 Other ventricular tachycardia: Secondary | ICD-10-CM

## 2014-07-27 DIAGNOSIS — I472 Ventricular tachycardia, unspecified: Secondary | ICD-10-CM

## 2014-07-27 DIAGNOSIS — Z950 Presence of cardiac pacemaker: Secondary | ICD-10-CM

## 2014-07-27 DIAGNOSIS — I4891 Unspecified atrial fibrillation: Secondary | ICD-10-CM

## 2014-07-27 DIAGNOSIS — I495 Sick sinus syndrome: Secondary | ICD-10-CM

## 2014-07-27 LAB — MDC_IDC_ENUM_SESS_TYPE_INCLINIC
Battery Impedance: 3024 Ohm
Battery Remaining Longevity: 11 mo
Brady Statistic AP VP Percent: 88 %
Brady Statistic AS VP Percent: 12 %
Lead Channel Impedance Value: 407 Ohm
Lead Channel Impedance Value: 425 Ohm
Lead Channel Pacing Threshold Amplitude: 0.75 V
Lead Channel Sensing Intrinsic Amplitude: 2 mV
Lead Channel Sensing Intrinsic Amplitude: 2.8 mV
Lead Channel Setting Pacing Amplitude: 2 V
MDC IDC MSMT BATTERY VOLTAGE: 2.68 V
MDC IDC MSMT LEADCHNL RA PACING THRESHOLD PULSEWIDTH: 0.4 ms
MDC IDC MSMT LEADCHNL RV PACING THRESHOLD AMPLITUDE: 0.5 V
MDC IDC MSMT LEADCHNL RV PACING THRESHOLD PULSEWIDTH: 0.4 ms
MDC IDC SESS DTM: 20150825151047
MDC IDC SET LEADCHNL RV PACING AMPLITUDE: 2.5 V
MDC IDC SET LEADCHNL RV PACING PULSEWIDTH: 0.4 ms
MDC IDC SET LEADCHNL RV SENSING SENSITIVITY: 1.4 mV
MDC IDC STAT BRADY AP VS PERCENT: 0 %
MDC IDC STAT BRADY AS VS PERCENT: 0 %

## 2014-07-27 MED ORDER — TORSEMIDE 20 MG PO TABS: 20.0000 mg | ORAL_TABLET | Freq: Every day | ORAL | Status: DC

## 2014-07-27 NOTE — Progress Notes (Signed)
Patient Care Team: Wanda Plump, MD as PCP - General   HPI  [name] is a 80 y.o. male Seen in followup for a pacemaker implanted for tachybradycardia syndrome. He also has a history of nonsustained ventricular tachycardia He has a history of coronary artery disease with prior bypass grafting.   He continues to complain of peripheral edema and dyspnea on exertion. His salt intake is quite good today; his fluid intake however is quite generous.   He was on amiodarone for atrial fibrillation but this was stopped because of leg weakness.   He underwent cardiac catheterization in 2011 when he presented with a non-ST elevation infarction. This demonstrated patency of his LIMA to LAD graft as well as the vein graft to diagonal, obtuse marginal, and PDA. He had subtotal occlusion of a small OM branch and medical therapy was recommended.  Echo showed normalization of LV function   Past Medical History  Diagnosis Date  . A-fib     paroxysmal, s/p TEE-DCCV 03/2013;  was on coumadin , pt desired to stop it , no longer a candidate per cards (hemorrhoidal bleeding; falls)  . Tachy-brady syndrome     s/p pacemaker  . CAD (coronary artery disease)     a. MI angioplasty 93, b. s/p CABG 01/2006;   c.  NSTEMI 06/2010 - [facility]:  L-LAD ok, S-Dx ok, S-OM ok, S-PDA ok, small OM sub total occluded - Med Rx  . Chronic diastolic heart failure   . Hyperlipidemia   . Hypertension   . Spinal stenosis      has seen Dr Ethelene Hal before, s/p local injection which helped x a while   . GERD (gastroesophageal reflux disease)   . RLS (restless legs syndrome)     ? of  . Insomnia   . History of BPH     TURP '96 at [facility] 96, urethoplast 97 ( History of bladder outlet obstruction, chronic incontinence)  . Pacemaker   . B12 deficiency anemia   . Myocardial infarction 01/1992; 06/2010  . Arthritis     back  . Hx of cardiovascular stress test     a.  Myoview (10/2005):  EF 62%, mild apical ischemia  . Hx of  echocardiogram     a.  Echo (03/23/13):  mod LVH, EF 50-55%, no WMA, mild MR, mod LAE, mild RVE, mod RAE, mod TR  . Atrial flutter     Past Surgical History  Procedure Laterality Date  . Coronary artery bypass graft      "CABG X4" (05/14/2013)  . Cystoscopy with urethral dilatation  1997    "fixed strictures from TURP" (05/14/2013)  . Inguinal hernia repair Right   . Carpal tunnel release Bilateral ~ 2005    Dr Teressa Senter  . Insert / replace / remove pacemaker  12/2006    PPM Medtronic  . Tee without cardioversion N/A 03/27/2013    Procedure: TRANSESOPHAGEAL ECHOCARDIOGRAM (TEE);  Surgeon: Lewayne Bunting, MD;  Location: [facility] ENDOSCOPY;  Service: Cardiovascular;  Laterality: N/A;  . Cardioversion N/A 03/27/2013    Procedure: CARDIOVERSION;  Surgeon: Lewayne Bunting, MD;  Location: [facility] ENDOSCOPY;  Service: Cardiovascular;  Laterality: N/A;  . Total knee arthroplasty Bilateral     right 2005, Left 2006.  Dr  Despina Hick  . Coronary angioplasty      "Dr. Riley Kill"  . Cataract extraction w/ intraocular lens  implant, bilateral Bilateral 2012  . Transurethral resection of prostate  1996  Current Outpatient Prescriptions  Medication Sig Dispense Refill  . acetaminophen (TYLENOL) 500 MG tablet Take 500 mg by mouth every 8 (eight) hours as needed for pain.      Marland Kitchen aspirin EC 81 MG tablet Take 81 mg by mouth daily.      . B Complex-C (B-COMPLEX WITH VITAMIN C) tablet Take 1 tablet by mouth daily.      . beta carotene w/minerals (OCUVITE) tablet Take 1 tablet by mouth daily.      . carvedilol (COREG) 6.25 MG tablet Take 6.25 mg by mouth 2 (two) times daily.      . potassium chloride (MICRO-K) 10 MEQ CR capsule Take one tablet by mouth daily alternating with two tablets by mouth daily  45 capsule  8  . pramipexole (MIRAPEX) 0.125 MG tablet TAKE 1 TO 2 TABLETS BY MOUTH AT BEDTIME AS NEEDED FOR RESTLESS LEGS  60 tablet  2  . torsemide (DEMADEX) 20 MG tablet Take 20 mg by mouth daily.       No current  facility-administered medications for this visit.    Allergies  Allergen Reactions  . Amlodipine Other (See Comments)    Feet swelled  . Atorvastatin Other (See Comments)     leg pain  . Zinc     Scaly rash    Review of Systems negative except from HPI and PMH  Physical Exam BP 154/83  Pulse 79  Ht  (1.702 m)  Wt 219 lb 3.2 oz (99.428 kg)  BMI 34.32 kg/m2 Well developed and well nourished in no acute distress HENT normal E scleral and icterus clear Neck Supple JVP8-9 carotids brisk and full Clear to ausculation  Regular rate and rhythm, no murmurs gallops or rub Soft with active bowel sounds No clubbing cyanosis 2+ Edema Alert and oriented, grossly normal motor and sensory function Skin Warm and Dry  ECG demonstrates AV pacing   Assessment and  Plan  Atrial fibrillation-paroxysmal   HFpEF   Hypertension   Implantable pacemaker-Medtronic The patient's device was interrogated.  The information was reviewed. No changes were made in the programming.    Chronic kidney disease   We'll increase his diuretics to 40 mg a day for a week. I encouraged him to try to decrease his by mouth fluid intake. We'll arrange for earlier earlier follow up with Dr. Excell Seltzer. His kidney function at this point is stable which allows this.

## 2014-07-27 NOTE — Patient Instructions (Signed)
Your physician has recommended you make the following change in your medication:  1) INCREASE Torsemide to 20 mg twice a day for 7 days - then return to normal dosing of 20 mg daily.  Please rearrange follow with Dr. Excell Seltzer - see him in 3 weeks.  Remote monitoring is used to monitor your Pacemaker of ICD from home. This monitoring reduces the number of office visits required to check your device to one time per year. It allows [place] to keep an eye on the functioning of your device to ensure it is working properly. You are scheduled for a device check from home on 09/27/14. You may send your transmission at any time that day. If you have a wireless device, the transmission will be sent automatically. After your physician reviews your transmission, you will receive a postcard with your next transmission date.  Your physician wants you to follow-up in: 1 year with Dr. Graciela Husbands.  You will receive a reminder letter in the mail two months in advance. If you don't receive a letter, please call our office to schedule the follow-up appointment.

## 2014-08-03 ENCOUNTER — Encounter: Payer: Self-pay | Admitting: Internal Medicine

## 2014-08-18 ENCOUNTER — Encounter: Payer: Self-pay | Admitting: Cardiovascular Disease

## 2014-08-18 ENCOUNTER — Ambulatory Visit (INDEPENDENT_AMBULATORY_CARE_PROVIDER_SITE_OTHER): Payer: Medicare Other | Admitting: Cardiovascular Disease

## 2014-08-18 VITALS — BP 132/82 | HR 81 | Ht 67.0 in | Wt 220.0 lb

## 2014-08-18 DIAGNOSIS — I5032 Chronic diastolic (congestive) heart failure: Secondary | ICD-10-CM

## 2014-08-18 DIAGNOSIS — I2510 Atherosclerotic heart disease of native coronary artery without angina pectoris: Secondary | ICD-10-CM

## 2014-08-18 NOTE — Patient Instructions (Signed)
Your physician wants you to follow-up in: 6 MONTHS with Dr Cooper.  You will receive a reminder letter in the mail two months in advance. If you don't receive a letter, please call our office to schedule the follow-up appointment.  Your physician recommends that you continue on your current medications as directed. Please refer to the Current Medication list given to you today.  

## 2014-08-18 NOTE — Progress Notes (Signed)
HPI:  [name] is a 80 y.o. male with a hx of CAD, s/p CABG in 2007, atrial fibrillation/flutter, tachybradycardia syndrome, s/p pacemaker, diastolic CHF, HTN, HL, bladder outlet obstruction 2/2 to urethral stricture from TURP many years ago, orthostatic hypotension with syncope and recurrent falls. Last cath in 06/2010 in setting of NSTEMI demonstrated patent grafts and subtotal occlusion of marginal branch treated medically. He is not on anticoagulation due to hemorrhoidal bleeding and frequent falls (Xarelto stopped in 2014). Has had chronic leg swelling requiring a loop diuretic therapy with torsemide. We have had to carefully balance his diuretic dosage because of tenuous renal function.  Major complaint continues to be leg weakness, generalized fatigue. He is sedentary. He still tinkers around house, but unable to do much more than that. He is not limited by dyspnea and denies chest pain, orthopnea, or PND. He has chronic leg swelling. Had some improvement after increasing his torsemide after he saw Dr. Graciela Husbands last month.   Outpatient Encounter Prescriptions as of 08/18/2014  Medication Sig  . acetaminophen (TYLENOL) 500 MG tablet Take 500 mg by mouth every 8 (eight) hours as needed for pain.  Marland Kitchen aspirin EC 81 MG tablet Take 81 mg by mouth daily.  . B Complex-C (B-COMPLEX WITH VITAMIN C) tablet Take 1 tablet by mouth daily.  . beta carotene w/minerals (OCUVITE) tablet Take 1 tablet by mouth daily.  . carvedilol (COREG) 6.25 MG tablet Take 6.25 mg by mouth 2 (two) times daily.  . potassium chloride (MICRO-K) 10 MEQ CR capsule Take one tablet by mouth daily alternating with two tablets by mouth daily  . pramipexole (MIRAPEX) 0.125 MG tablet TAKE 1 TO 2 TABLETS BY MOUTH AT BEDTIME AS NEEDED FOR RESTLESS LEGS  . torsemide (DEMADEX) 20 MG tablet Take 1 tablet (20 mg total) by mouth daily.    Allergies  Allergen Reactions  . Amlodipine Other (See Comments)    Feet swelled  .  Atorvastatin Other (See Comments)     leg pain  . Zinc     Scaly rash    Past Medical History  Diagnosis Date  . A-fib     paroxysmal, s/p TEE-DCCV 03/2013;  was on coumadin , pt desired to stop it , no longer a candidate per cards (hemorrhoidal bleeding; falls)  . Tachy-brady syndrome     s/p pacemaker  . CAD (coronary artery disease)     a. MI angioplasty 93, b. s/p CABG 01/2006;   c.  NSTEMI 06/2010 - [facility]:  L-LAD ok, S-Dx ok, S-OM ok, S-PDA ok, small OM sub total occluded - Med Rx  . Chronic diastolic heart failure   . Hyperlipidemia   . Hypertension   . Spinal stenosis      has seen Dr Ethelene Hal before, s/p local injection which helped x a while   . GERD (gastroesophageal reflux disease)   . RLS (restless legs syndrome)     ? of  . Insomnia   . History of BPH     TURP '96 at [facility] 96, urethoplast 97 ( History of bladder outlet obstruction, chronic incontinence)  . Pacemaker   . B12 deficiency anemia   . Myocardial infarction 01/1992; 06/2010  . Arthritis     back  . Hx of cardiovascular stress test     a.  Myoview (10/2005):  EF 62%, mild apical ischemia  . Hx of echocardiogram     a.  Echo (03/23/13):  mod LVH, EF 50-55%, no WMA, mild  MR, mod LAE, mild RVE, mod RAE, mod TR  . Atrial flutter     BP 132/82  Pulse 81  Ht  (1.702 m)  Wt 220 lb (99.791 kg)  BMI 34.45 kg/m2  SpO2 98%  PHYSICAL EXAM: Pt is alert and oriented, elderly male in NAD HEENT: normal Neck: JVP - mildly increased, carotids 2+= without bruits Lungs: CTA bilaterally CV: RRR with grade 2/6 systolic ejection murmur at the right upper sternal border Abd: soft, NT, Positive BS, no hepatomegaly Ext: no C/C/E, distal pulses intact and equal Skin: warm/dry no rash  ASSESSMENT AND PLAN: 1. Chronic diastolic heart failure. Overall appears stable. His leg swelling is likely multifactorial. Advised that he should continue torsemide 20 mg daily and use a double dose of 40 mg when his leg swelling increases.  He's had significant increase in creatinine when he's been on the 40 mg dose of torsemide as a standing dose. Also counseled him about leg elevation. No other changes were recommended and his medical regimen today.  2. Atrial fibrillation/flutter. Followed by Dr. Graciela Husbands. Has been taken off of anticoagulation because of falls and bleeding.  3. Hyperlipidemia. The patient is statin intolerant. Continue observation considering his age of 66.  4. Hypertension. Blood pressure well controlled on carvedilol and torsemide.  5. CAD status post CABG. No anginal symptoms. Most recent cardiac catheterization reviewed. Continue current medical therapy.  Tonny Bollman 08/18/2014 9:02 AM

## 2014-09-17 ENCOUNTER — Other Ambulatory Visit: Payer: Self-pay | Admitting: Physician Assistant

## 2014-09-17 NOTE — Telephone Encounter (Signed)
Caller name:Aguinaga,Laurie Relation to ZO:XWRUEApt:spouse  Call back number:[phone] Pharmacy: [facility] [phone]   Reason for call:   requesting a refill pramipexole (MIRAPEX) 0.125 MG tablet. Medication requires prio-aut as per pharmacy. Pt spouse states husband is almost out of medication. Please advise

## 2014-09-24 ENCOUNTER — Ambulatory Visit: Payer: Medicare Other | Admitting: Cardiovascular Disease

## 2014-09-27 ENCOUNTER — Ambulatory Visit (INDEPENDENT_AMBULATORY_CARE_PROVIDER_SITE_OTHER): Payer: Medicare Other | Admitting: *Deleted

## 2014-09-27 ENCOUNTER — Encounter: Payer: Self-pay | Admitting: Internal Medicine

## 2014-09-27 DIAGNOSIS — I495 Sick sinus syndrome: Secondary | ICD-10-CM

## 2014-09-27 NOTE — Progress Notes (Signed)
Remote pacemaker transmission.   

## 2014-09-30 LAB — MDC_IDC_ENUM_SESS_TYPE_REMOTE
Brady Statistic AS VS Percent: 0 %
Date Time Interrogation Session: 20151026162702
Lead Channel Impedance Value: 445 Ohm
Lead Channel Pacing Threshold Amplitude: 0.75 V
Lead Channel Pacing Threshold Pulse Width: 0.4 ms
Lead Channel Pacing Threshold Pulse Width: 0.4 ms
MDC IDC MSMT BATTERY IMPEDANCE: 3334 Ohm
MDC IDC MSMT BATTERY REMAINING LONGEVITY: 10 mo
MDC IDC MSMT BATTERY VOLTAGE: 2.67 V
MDC IDC MSMT LEADCHNL RA IMPEDANCE VALUE: 453 Ohm
MDC IDC MSMT LEADCHNL RA PACING THRESHOLD AMPLITUDE: 0.625 V
MDC IDC SET LEADCHNL RA PACING AMPLITUDE: 2 V
MDC IDC SET LEADCHNL RV PACING AMPLITUDE: 2.5 V
MDC IDC SET LEADCHNL RV PACING PULSEWIDTH: 0.4 ms
MDC IDC SET LEADCHNL RV SENSING SENSITIVITY: 1.4 mV
MDC IDC STAT BRADY AP VP PERCENT: 88 %
MDC IDC STAT BRADY AP VS PERCENT: 0 %
MDC IDC STAT BRADY AS VP PERCENT: 11 %

## 2014-10-12 ENCOUNTER — Ambulatory Visit: Payer: Medicare Other

## 2014-10-12 ENCOUNTER — Telehealth: Payer: Self-pay | Admitting: *Deleted

## 2014-10-12 ENCOUNTER — Ambulatory Visit (INDEPENDENT_AMBULATORY_CARE_PROVIDER_SITE_OTHER): Payer: Medicare Other | Admitting: *Deleted

## 2014-10-12 DIAGNOSIS — Z23 Encounter for immunization: Secondary | ICD-10-CM

## 2014-10-12 NOTE — Telephone Encounter (Signed)
Pt requesting medication for sleep. Pt states that hes having troubles getting to sleep. Pt states that he would stay up till 2 AM. Please advise

## 2014-10-13 MED ORDER — ZOLPIDEM TARTRATE 5 MG PO TABS: 5.0000 mg | ORAL_TABLET | Freq: Every evening | ORAL | Status: DC | PRN

## 2014-10-13 NOTE — Telephone Encounter (Signed)
LMOM for Pt to return call.  

## 2014-10-13 NOTE — Telephone Encounter (Signed)
Faxed to [facility].  

## 2014-10-13 NOTE — Telephone Encounter (Signed)
Will try Ambien 5 mg daily at bedtime when necessary, prescription printed. If 5 mg feels to be too much, okay to take half tablet

## 2014-10-18 ENCOUNTER — Telehealth: Payer: Self-pay | Admitting: Internal Medicine

## 2014-10-18 NOTE — Telephone Encounter (Signed)
Caller name: Jacki ConesLaurie Relation to pt: wife Call back number: [phone] Pharmacy:  Reason for call:   Patient wife states that patient fell this morning. EMS was called and came out and they said that patient was fine, nothing was broken. Jacki ConesLaurie states that EMS did a heart check on him and that was fine as well. Jacki ConesLaurie says that patient is a little sore and bruised and states that he does not want to go to the ED.

## 2014-10-19 NOTE — Telephone Encounter (Signed)
Noted  

## 2014-10-19 NOTE — Telephone Encounter (Signed)
Spoke with Jacki ConesLaurie, Pts wife, she informed me Pt has fallen 2 more times since first initial fall. She informed me Pt fell getting into chair and on the way to the bathroom yesterday. She informed me EMS came out and evaluated Pt, vital signs were normal and Pt refused to go to ED. I informed Jacki ConesLaurie it may be best for Pt to come into office to see Dr. Drue NovelPaz since he has fallen 2 more times, which she agreed. Pt has appt on 10/18 at 1400. Jacki ConesLaurie informed me that Pt is currently using heating pads and Tylenol for pain. Jacki ConesLaurie informed if Pt experiences another fall or if he gets worse, he will need to go to ED. Jacki ConesLaurie verbalized understanding.

## 2014-10-19 NOTE — Telephone Encounter (Signed)
Use Tylenol as needed, call back if he has severe pain, decreased mobility, headache, neck or back pain.

## 2014-10-20 ENCOUNTER — Ambulatory Visit (HOSPITAL_BASED_OUTPATIENT_CLINIC_OR_DEPARTMENT_OTHER)
Admission: RE | Admit: 2014-10-20 | Discharge: 2014-10-20 | Disposition: A | Discharge status: Home / Self Care | Payer: Medicare Other | Source: Ambulatory Visit | Attending: Internal Medicine | Admitting: Internal Medicine

## 2014-10-20 ENCOUNTER — Ambulatory Visit (INDEPENDENT_AMBULATORY_CARE_PROVIDER_SITE_OTHER): Payer: Medicare Other | Admitting: Internal Medicine

## 2014-10-20 ENCOUNTER — Encounter: Payer: Self-pay | Admitting: Internal Medicine

## 2014-10-20 VITALS — BP 122/74 | HR 75 | Temp 98.3°F

## 2014-10-20 DIAGNOSIS — W19XXXA Unspecified fall, initial encounter: Secondary | ICD-10-CM | POA: Insufficient documentation

## 2014-10-20 DIAGNOSIS — E785 Hyperlipidemia, unspecified: Secondary | ICD-10-CM | POA: Insufficient documentation

## 2014-10-20 DIAGNOSIS — I5032 Chronic diastolic (congestive) heart failure: Secondary | ICD-10-CM | POA: Insufficient documentation

## 2014-10-20 DIAGNOSIS — I2510 Atherosclerotic heart disease of native coronary artery without angina pectoris: Secondary | ICD-10-CM | POA: Insufficient documentation

## 2014-10-20 DIAGNOSIS — M810 Age-related osteoporosis without current pathological fracture: Secondary | ICD-10-CM | POA: Diagnosis not present

## 2014-10-20 DIAGNOSIS — I252 Old myocardial infarction: Secondary | ICD-10-CM | POA: Diagnosis not present

## 2014-10-20 DIAGNOSIS — I10 Essential (primary) hypertension: Secondary | ICD-10-CM | POA: Insufficient documentation

## 2014-10-20 DIAGNOSIS — G319 Degenerative disease of nervous system, unspecified: Secondary | ICD-10-CM | POA: Diagnosis present

## 2014-10-20 DIAGNOSIS — I639 Cerebral infarction, unspecified: Secondary | ICD-10-CM | POA: Insufficient documentation

## 2014-10-20 DIAGNOSIS — I4891 Unspecified atrial fibrillation: Secondary | ICD-10-CM | POA: Insufficient documentation

## 2014-10-20 DIAGNOSIS — M25551 Pain in right hip: Secondary | ICD-10-CM | POA: Diagnosis present

## 2014-10-20 DIAGNOSIS — I6782 Cerebral ischemia: Secondary | ICD-10-CM | POA: Insufficient documentation

## 2014-10-20 DIAGNOSIS — R269 Unspecified abnormalities of gait and mobility: Secondary | ICD-10-CM

## 2014-10-20 DIAGNOSIS — R296 Repeated falls: Secondary | ICD-10-CM

## 2014-10-20 DIAGNOSIS — G4700 Insomnia, unspecified: Secondary | ICD-10-CM

## 2014-10-20 NOTE — Patient Instructions (Signed)
Stop by the first floor and get the XR  We are doing a CT of the head, please talk with my staff before you leave  Stop Ambien  Use your walker all the time  Call if you have another fall

## 2014-10-20 NOTE — Assessment & Plan Note (Signed)
[place]  [place] to insomnia, had several falls. Plan:  Discontinue Ambien For now I recommend more healthy sleep habits, they were discussed with the patient

## 2014-10-20 NOTE — Progress Notes (Signed)
Subjective:    Patient ID: [name], male    DOB: 05/18/1921, 80 y.o.   MRN: [id number]  DOS:  10/20/2014 Type of visit - description : acute Interval history: Here with his wife, had 3 or 4 falls since Monday. One time he lost his balance and fell over a chair  at his living room. Another time he was using his walker going to the bathroom, felt  like his legs gave out, had a very soft landing. Another time he slid out of the chair. They has called EMS 3 or 4 times, BPs were ok, was check w/ a heart monitor and was told he was okay. He started to take Ambien few days ago for difficulty sleeping.  ROS Denies fever chills No nausea, vomiting, blood in the stools. No dysuria or gross hematuria No back or neck pain. No headaches. Admits to pain at the right hip. Denies diplopia or motor deficits, speech slightly more impaired? Patient's wife is not sure because he has a loose denture.   Past Medical History  Diagnosis Date  . A-fib     paroxysmal, s/p TEE-DCCV 03/2013;  was on coumadin , pt desired to stop it , no longer a candidate per cards (hemorrhoidal bleeding; falls)  . Tachy-brady syndrome     s/p pacemaker  . CAD (coronary artery disease)     a. MI angioplasty 93, b. s/p CABG 01/2006;   c.  NSTEMI 06/2010 - [facility]:  L-LAD ok, S-Dx ok, S-OM ok, S-PDA ok, small OM sub total occluded - Med Rx  . Chronic diastolic heart failure   . Hyperlipidemia   . Hypertension   . Spinal stenosis      has seen Dr Ethelene Halamos before, s/p local injection which helped x a while   . GERD (gastroesophageal reflux disease)   . RLS (restless legs syndrome)     ? of  . Insomnia   . History of BPH     TURP '96 at [facility] 96, urethoplast 97 ( History of bladder outlet obstruction, chronic incontinence)  . Pacemaker   . B12 deficiency anemia   . Myocardial infarction 01/1992; 06/2010  . Arthritis     back  . Hx of cardiovascular stress test     a.  Myoview (10/2005):  EF 62%, mild apical ischemia    . Hx of echocardiogram     a.  Echo (03/23/13):  mod LVH, EF 50-55%, no WMA, mild MR, mod LAE, mild RVE, mod RAE, mod TR  . Atrial flutter     Past Surgical History  Procedure Laterality Date  . Coronary artery bypass graft      "CABG X4" (05/14/2013)  . Cystoscopy with urethral dilatation  1997    "fixed strictures from TURP" (05/14/2013)  . Inguinal hernia repair Right   . Carpal tunnel release Bilateral ~ 2005    Dr Teressa SenterSypher  . Insert / replace / remove pacemaker  12/2006    PPM [employer]tr[employer]  . Tee without cardioversion N/A 03/27/2013    Procedure: TRANSESOPHAGEAL ECHOCARDIOGRAM (TEE);  Surgeon: Lewayne BuntingBrian S Crenshaw, MD;  [facility];  Service: Cardiovascular;  Laterality: N/A;  . Cardioversion N/A 03/27/2013    Procedure: CARDIOVERSION;  Surgeon: Lewayne BuntingBrian S Crenshaw, MD;  [facility];  Service: Cardiovascular;  Laterality: N/A;  . Total knee arthroplasty Bilateral     right 2005, Left 2006.  Dr  Despina HickAlusio  . Coronary angioplasty      "Dr. Riley KillStuckey"  . Cataract  extraction w/ intraocular lens  implant, bilateral Bilateral 2012  . Transurethral resection of prostate  1996    History   Social History  . Marital Status: Married    Spouse Name: N/A    Number of Children: 2  . Years of Education: N/A   Occupational History  . retired     Social History Main Topics  . Smoking status: Former Smoker -- 2.00 packs/day for 15 years    Types: Cigarettes    Quit date: 02/01/1959  . Smokeless tobacco: Never Used  . Alcohol Use: No  . Drug Use: No  . Sexual Activity: Not Currently   Other Topics Concern  . Not on file   Social History Narrative   Lives at home  w/ wife, wife drives    2 children, son lives  in town [place]        Medication List       This list is accurate as of: 10/20/14  6:27 PM.  Always use your most recent med list.               acetaminophen 500 MG tablet  Commonly known as:  TYLENOL  Take 500 mg by mouth every 8 (eight) hours as  needed for pain.     aspirin EC 81 MG tablet  Take 81 mg by mouth daily.     B-complex with vitamin C tablet  Take 1 tablet by mouth daily.     beta carotene w/minerals tablet  Take 1 tablet by mouth daily.     carvedilol 6.25 MG tablet  Commonly known as:  COREG  Take 6.25 mg by mouth 2 (two) times daily.     potassium chloride 10 MEQ CR capsule  Commonly known as:  MICRO-K  Take one tablet by mouth daily alternating with two tablets by mouth daily     pramipexole 0.125 MG tablet  Commonly known as:  MIRAPEX  TAKE 1 TO 2 TABLETS BY MOUTH AT BEDTIME AS NEEDED FOR RESTLESS LEGS     torsemide 20 MG tablet  Commonly known as:  DEMADEX  Take 1 tablet (20 mg total) by mouth daily.           Objective:   Physical Exam BP 122/74 mmHg  Pulse 75  Temp(Src) 98.3 F (36.8 C) (Oral)  Wt   SpO2 98% General -- alert, well-developed, sitting in a wheelchair in no apparent distress. He was able to walk today with his walker. Gait not tested.   Lungs -- normal respiratory effort, no intercostal retractions, no accessory muscle use, and normal breath sounds.  Heart-- seems regular.   Extremities-- +/+++ pretibial edema bilaterally  Neurologic--  alert & oriented to self place and time except for today's day, he said Friday. Speech at baseline. Motor and face symmetric.  Psych-- Cognition and judgment appear intact. Cooperative with normal attention span and concentration. No anxious or depressed appearing.       Assessment & Plan:   Today , I spent more than 40    min with the patient and his wife: >50% of the time counseling regards risk of falls, fall prevention, the need to move to an assisted living facility to prevent further falls.  Right hip pain, Check x-ray

## 2014-10-20 NOTE — Progress Notes (Signed)
Pre visit review using our clinic review tool, if applicable. No additional management support is needed unless otherwise documented below in the visit note. 

## 2014-10-20 NOTE — Assessment & Plan Note (Signed)
  Frequent falls in the last few days, the only thing he is doing different is taking Ambien for insomnia. Review of systems is positive for a question of slurred speech. Neurological exam is grossly symmetric. I am somehow concerned about the falls  Representing a  stroke given the question of slurred speech but is also likely that is a problem created by Ambien Plan: Discontinue Ambien Consistent use of a walker Strongly encouraged them to discuss with other family members about moving to an assisted living facility as he is will be more dependent on help Call if further falls CT head( unable to do MRI due to pacemaker)

## 2014-12-09 ENCOUNTER — Encounter: Payer: Self-pay | Admitting: Cardiology

## 2014-12-15 ENCOUNTER — Other Ambulatory Visit: Payer: Self-pay | Admitting: Cardiovascular Disease

## 2014-12-29 ENCOUNTER — Ambulatory Visit (INDEPENDENT_AMBULATORY_CARE_PROVIDER_SITE_OTHER): Payer: Medicare Other | Admitting: *Deleted

## 2014-12-29 ENCOUNTER — Telehealth: Payer: Self-pay | Admitting: Cardiology

## 2014-12-29 ENCOUNTER — Encounter: Payer: Self-pay | Admitting: Internal Medicine

## 2014-12-29 DIAGNOSIS — I495 Sick sinus syndrome: Secondary | ICD-10-CM

## 2014-12-29 LAB — MDC_IDC_ENUM_SESS_TYPE_REMOTE
Battery Impedance: 3898 Ohm
Battery Remaining Longevity: 7 mo
Battery Voltage: 2.65 V
Brady Statistic AS VP Percent: 15 %
Brady Statistic AS VS Percent: 0 %
Date Time Interrogation Session: 20160127211106
Lead Channel Impedance Value: 450 Ohm
Lead Channel Pacing Threshold Amplitude: 0.625 V
Lead Channel Pacing Threshold Amplitude: 0.75 V
Lead Channel Pacing Threshold Pulse Width: 0.4 ms
Lead Channel Setting Pacing Amplitude: 2 V
Lead Channel Setting Pacing Amplitude: 2.5 V
Lead Channel Setting Pacing Pulse Width: 0.4 ms
MDC IDC MSMT LEADCHNL RV IMPEDANCE VALUE: 506 Ohm
MDC IDC MSMT LEADCHNL RV PACING THRESHOLD PULSEWIDTH: 0.4 ms
MDC IDC SET LEADCHNL RV SENSING SENSITIVITY: 1.4 mV
MDC IDC STAT BRADY AP VP PERCENT: 84 %
MDC IDC STAT BRADY AP VS PERCENT: 0 %

## 2014-12-29 NOTE — Telephone Encounter (Signed)
Attempted to confirm remote transmission with pt. No answer and was unable to leave a message.   

## 2014-12-29 NOTE — Progress Notes (Signed)
Remote pacemaker transmission.   

## 2015-01-01 DIAGNOSIS — H524 Presbyopia: Secondary | ICD-10-CM | POA: Diagnosis not present

## 2015-01-01 DIAGNOSIS — H3531 Nonexudative age-related macular degeneration: Secondary | ICD-10-CM | POA: Diagnosis not present

## 2015-01-01 DIAGNOSIS — H52223 Regular astigmatism, bilateral: Secondary | ICD-10-CM | POA: Diagnosis not present

## 2015-01-01 DIAGNOSIS — H5203 Hypermetropia, bilateral: Secondary | ICD-10-CM | POA: Diagnosis not present

## 2015-01-02 ENCOUNTER — Emergency Department (HOSPITAL_COMMUNITY)
Admission: EM | Admit: 2015-01-02 | Discharge: 2015-01-02 | Disposition: A | Discharge status: Home / Self Care | Payer: Medicare Other | Attending: Emergency Medicine | Admitting: Emergency Medicine

## 2015-01-02 ENCOUNTER — Encounter (HOSPITAL_COMMUNITY): Payer: Self-pay | Admitting: Emergency Medicine

## 2015-01-02 DIAGNOSIS — Z951 Presence of aortocoronary bypass graft: Secondary | ICD-10-CM | POA: Insufficient documentation

## 2015-01-02 DIAGNOSIS — Z8639 Personal history of other endocrine, nutritional and metabolic disease: Secondary | ICD-10-CM | POA: Diagnosis not present

## 2015-01-02 DIAGNOSIS — I5032 Chronic diastolic (congestive) heart failure: Secondary | ICD-10-CM | POA: Insufficient documentation

## 2015-01-02 DIAGNOSIS — I4891 Unspecified atrial fibrillation: Secondary | ICD-10-CM | POA: Insufficient documentation

## 2015-01-02 DIAGNOSIS — I252 Old myocardial infarction: Secondary | ICD-10-CM | POA: Insufficient documentation

## 2015-01-02 DIAGNOSIS — M1990 Unspecified osteoarthritis, unspecified site: Secondary | ICD-10-CM | POA: Insufficient documentation

## 2015-01-02 DIAGNOSIS — Z9861 Coronary angioplasty status: Secondary | ICD-10-CM | POA: Insufficient documentation

## 2015-01-02 DIAGNOSIS — Z7982 Long term (current) use of aspirin: Secondary | ICD-10-CM | POA: Insufficient documentation

## 2015-01-02 DIAGNOSIS — Y9389 Activity, other specified: Secondary | ICD-10-CM | POA: Insufficient documentation

## 2015-01-02 DIAGNOSIS — I2510 Atherosclerotic heart disease of native coronary artery without angina pectoris: Secondary | ICD-10-CM | POA: Insufficient documentation

## 2015-01-02 DIAGNOSIS — Z87891 Personal history of nicotine dependence: Secondary | ICD-10-CM | POA: Diagnosis not present

## 2015-01-02 DIAGNOSIS — Z8719 Personal history of other diseases of the digestive system: Secondary | ICD-10-CM | POA: Insufficient documentation

## 2015-01-02 DIAGNOSIS — I10 Essential (primary) hypertension: Secondary | ICD-10-CM | POA: Insufficient documentation

## 2015-01-02 DIAGNOSIS — S8992XA Unspecified injury of left lower leg, initial encounter: Secondary | ICD-10-CM | POA: Insufficient documentation

## 2015-01-02 DIAGNOSIS — R531 Weakness: Secondary | ICD-10-CM | POA: Diagnosis not present

## 2015-01-02 DIAGNOSIS — G2581 Restless legs syndrome: Secondary | ICD-10-CM | POA: Diagnosis not present

## 2015-01-02 DIAGNOSIS — W1839XA Other fall on same level, initial encounter: Secondary | ICD-10-CM | POA: Diagnosis not present

## 2015-01-02 DIAGNOSIS — Z79899 Other long term (current) drug therapy: Secondary | ICD-10-CM | POA: Insufficient documentation

## 2015-01-02 DIAGNOSIS — Y998 Other external cause status: Secondary | ICD-10-CM | POA: Diagnosis not present

## 2015-01-02 DIAGNOSIS — Z9181 History of falling: Secondary | ICD-10-CM | POA: Diagnosis not present

## 2015-01-02 DIAGNOSIS — R269 Unspecified abnormalities of gait and mobility: Secondary | ICD-10-CM | POA: Diagnosis not present

## 2015-01-02 DIAGNOSIS — Y9289 Other specified places as the place of occurrence of the external cause: Secondary | ICD-10-CM | POA: Diagnosis not present

## 2015-01-02 DIAGNOSIS — R404 Transient alteration of awareness: Secondary | ICD-10-CM | POA: Diagnosis not present

## 2015-01-02 DIAGNOSIS — S8991XA Unspecified injury of right lower leg, initial encounter: Secondary | ICD-10-CM | POA: Insufficient documentation

## 2015-01-02 LAB — URINALYSIS, ROUTINE W REFLEX MICROSCOPIC
BILIRUBIN URINE: NEGATIVE
Glucose, UA: NEGATIVE mg/dL
HGB URINE DIPSTICK: NEGATIVE
Ketones, ur: NEGATIVE mg/dL
LEUKOCYTES UA: NEGATIVE
NITRITE: NEGATIVE
PROTEIN: NEGATIVE mg/dL
Specific Gravity, Urine: 1.01 (ref 1.005–1.030)
Urobilinogen, UA: 0.2 mg/dL (ref 0.0–1.0)
pH: 6 (ref 5.0–8.0)

## 2015-01-02 LAB — CBC
HEMATOCRIT: 35 % — AB (ref 39.0–52.0)
Hemoglobin: 11.6 g/dL — ABNORMAL LOW (ref 13.0–17.0)
MCH: 30.4 pg (ref 26.0–34.0)
MCHC: 33.1 g/dL (ref 30.0–36.0)
MCV: 91.9 fL (ref 78.0–100.0)
PLATELETS: 192 10*3/uL (ref 150–400)
RBC: 3.81 MIL/uL — ABNORMAL LOW (ref 4.22–5.81)
RDW: 13.4 % (ref 11.5–15.5)
WBC: 9.1 10*3/uL (ref 4.0–10.5)

## 2015-01-02 LAB — BASIC METABOLIC PANEL
ANION GAP: 6 (ref 5–15)
BUN: 21 mg/dL (ref 6–23)
CO2: 24 mmol/L (ref 19–32)
Calcium: 8.5 mg/dL (ref 8.4–10.5)
Chloride: 107 mmol/L (ref 96–112)
Creatinine, Ser: 1.11 mg/dL (ref 0.50–1.35)
GFR calc Af Amer: 65 mL/min — ABNORMAL LOW (ref >90)
GFR, EST NON AFRICAN AMERICAN: 56 mL/min — AB (ref >90)
Glucose, Bld: 99 mg/dL (ref 70–99)
POTASSIUM: 3.9 mmol/L (ref 3.5–5.1)
Sodium: 137 mmol/L (ref 135–145)

## 2015-01-02 LAB — SEDIMENTATION RATE: Sed Rate: 41 mm/hr — ABNORMAL HIGH (ref 0–16)

## 2015-01-02 LAB — CK: Total CK: 191 U/L (ref 7–232)

## 2015-01-02 NOTE — ED Provider Notes (Signed)
CSN: [id number]     Arrival date & time 01/02/15  1856 History   First MD Initiated Contact with Patient 01/02/15 2005     Chief Complaint  Patient presents with  . Extremity Weakness     (Consider location/radiation/quality/duration/timing/severity/associated sxs/prior Treatment) HPI Comments: Patient here with acute onset of bilateral lower extremity weakness localized to his feet. Began today when he was trying to walk up his bathroom where he says he couldn't take a step. Denies any back pain. No bowel or bladder dysfunction. No saddle anesthesias. Denies any back pain. Does use a walker to ambulate. No recent history of fevers or chills. Symptoms worsened ambulation. Denies any upper extremity weakness. No severe headaches. No neck pain. No prior history of same.  Patient is a 81 y.o. male presenting with extremity weakness. The history is provided by the patient and a relative.  Extremity Weakness    Past Medical History  Diagnosis Date  . A-fib     paroxysmal, s/p TEE-DCCV 03/2013;  was on coumadin , pt desired to stop it , no longer a candidate per cards (hemorrhoidal bleeding; falls)  . Tachy-brady syndrome     s/p pacemaker  . CAD (coronary artery disease)     a. MI angioplasty 93, b. s/p CABG 01/2006;   c.  NSTEMI 06/2010 - [facility]:  L-LAD ok, S-Dx ok, S-OM ok, S-PDA ok, small OM sub total occluded - Med Rx  . Chronic diastolic heart failure   . Hyperlipidemia   . Hypertension   . Spinal stenosis      has seen Dr Ethelene Hal before, s/p local injection which helped x a while   . GERD (gastroesophageal reflux disease)   . RLS (restless legs syndrome)     ? of  . Insomnia   . History of BPH     TURP '96 at [facility] 96, urethoplast 97 ( History of bladder outlet obstruction, chronic incontinence)  . Pacemaker   . B12 deficiency anemia   . Myocardial infarction 01/1992; 06/2010  . Arthritis     back  . Hx of cardiovascular stress test     a.  Myoview (10/2005):  EF 62%, mild apical  ischemia  . Hx of echocardiogram     a.  Echo (03/23/13):  mod LVH, EF 50-55%, no WMA, mild MR, mod LAE, mild RVE, mod RAE, mod TR  . Atrial flutter    Past Surgical History  Procedure Laterality Date  . Coronary artery bypass graft      "CABG X4" (05/14/2013)  . Cystoscopy with urethral dilatation  1997    "fixed strictures from TURP" (05/14/2013)  . Inguinal hernia repair Right   . Carpal tunnel release Bilateral ~ 2005    Dr Teressa Senter  . Insert / replace / remove pacemaker  12/2006    PPM [employer]  . Tee without cardioversion N/A 03/27/2013    Procedure: TRANSESOPHAGEAL ECHOCARDIOGRAM (TEE);  Surgeon: Lewayne Bunting, MD;  Location[facility] END[facility]COPY;  Service: Cardiovascular;  Laterality: N/A;  . Cardioversion N/A 03/27/2013    Procedure: CARDIOVERSION;  Surgeon: Lewayne Bunting, MD;  [facility] END[facility];  Service: Cardiovascular;  Laterality: N/A;  . Total knee arthroplasty Bilateral     right 2005, Left 2006.  Dr  Despina Hick  . Coronary angioplasty      "Dr. Riley Kill"  . Cataract extraction w/ intraocular lens  implant, bilateral Bilateral 2012  . Transurethral resection of prostate  1996   Family History  Problem Relation Age of  Onset  . Heart disease Neg Hx   . Diabetes Neg Hx    History  Substance Use Topics  . Smoking status: Former Smoker -- 2.00 packs/day for 15 years    Types: Cigarettes    Quit date: 02/01/1959  . Smokeless tobacco: Never Used  . Alcohol Use: No    Review of Systems  Musculoskeletal: Positive for extremity weakness.  All other systems reviewed and are negative.     Allergies  Amlodipine; Atorvastatin; and Zinc  Home Medications   Prior to Admission medications   Medication Sig Start Date End Date Taking? Authorizing Provider  acetaminophen (TYLENOL) 500 MG tablet Take 500 mg by mouth every 8 (eight) hours as needed for pain.    Historical Provider, MD  aspirin EC 81 MG tablet Take 81 mg by mouth daily.    Historical Provider, MD  B Complex-C  (B-COMPLEX WITH VITAMIN C) tablet Take 1 tablet by mouth daily.    Historical Provider, MD  beta carotene w/minerals (OCUVITE) tablet Take 1 tablet by mouth daily.    Historical Provider, MD  carvedilol (COREG) 6.25 MG tablet Take 6.25 mg by mouth 2 (two) times daily. 07/01/14   Historical Provider, MD  carvedilol (COREG) 6.25 MG tablet TAKE 1 TABLET TWO TIMES A DAY WITH A MEAL 12/16/14   Micheline ChapmanMichael D Cooper, MD  potassium chloride (MICRO-K) 10 MEQ CR capsule Take one tablet by mouth daily alternating with two tablets by mouth daily 03/15/14   Micheline ChapmanMichael D Cooper, MD  pramipexole (MIRAPEX) 0.125 MG tablet TAKE 1 TO 2 TABLETS BY MOUTH AT BEDTIME AS NEEDED FOR RESTLESS LEGS 09/17/14   Wanda PlumpJose E Paz, MD  torsemide (DEMADEX) 20 MG tablet Take 1 tablet (20 mg total) by mouth daily. 07/27/14   [facility] SalviaSteven C Klein, MD   BP 106/75 mmHg  Pulse 88  Temp(Src) 98 F (36.7 C) (Oral)  Resp 18  SpO2 97% Physical Exam  Constitutional: He is oriented to person, place, and time. He appears well-developed and well-nourished.  Non-toxic appearance. No distress.  HENT:  Head: Normocephalic and atraumatic.  Eyes: Conjunctivae, EOM and lids are normal. Pupils are equal, round, and reactive to light.  Neck: Normal range of motion. Neck supple. No tracheal deviation present. No thyroid mass present.  Cardiovascular: Normal rate, regular rhythm and normal heart sounds.  Exam reveals no gallop.   No murmur heard. Pulmonary/Chest: Effort normal and breath sounds normal. No stridor. No respiratory distress. He has no decreased breath sounds. He has no wheezes. He has no rhonchi. He has no rales.  Abdominal: Soft. Normal appearance and bowel sounds are normal. He exhibits no distension. There is no tenderness. There is no rebound and no CVA tenderness.  Musculoskeletal: Normal range of motion. He exhibits no edema or tenderness.       Legs: Neurological: He is alert and oriented to person, place, and time. He has normal strength. No  cranial nerve deficit or sensory deficit. GCS eye subscore is 4. GCS verbal subscore is 5. GCS motor subscore is 6.  Skin: Skin is warm and dry. No abrasion and no rash noted.  Psychiatric: He has a normal mood and affect. His speech is normal and behavior is normal.  Nursing note and vitals reviewed.   ED Course  Procedures (including critical care time) Labs Review Labs Reviewed  CBC - Abnormal; Notable for the following:    RBC 3.81 (*)    Hemoglobin 11.6 (*)    HCT 35.0 (*)  All other components within normal limits  BASIC METABOLIC PANEL - Abnormal; Notable for the following:    GFR calc non Af Amer 56 (*)    GFR calc Af Amer 65 (*)    All other components within normal limits  CK  SEDIMENTATION RATE  URINALYSIS, ROUTINE W REFLEX MICROSCOPIC    Imaging Review No results found.   EKG Interpretation   Date/Time:  Sunday January 02 2015 19:50:00 EST Ventricular Rate:  88 PR Interval:    QRS Duration: 155 QT Interval:  434 QTC Calculation: 525 R Axis:   -49 Text Interpretation:  VENTRICULAR PACING No further analysis attempted due  to paced rhythm No significant change since last tracing Confirmed by  KNAPP  MD-J, JON ([id number]) on 01/02/2015 7:54:29 PM      MDM   Final diagnoses:  None   Patient was able to ambulate with assistance here. Patient has no evidence of CVA here. He has had increased falls recently. Does use a walker. He does have limited home health. I offered the patient and his family to be observed overnight in the ER to be seen by the social worker morning. They have elected to instead see the primary care doctor tomorrow and work on long-term placement. Patient does complain of some right knee pain but he has full range of motion at the right knee without evidence of effusion or swelling. There is no abrasion noted. I do not think x-rays would change his clinical course at this time. He was able to ambulate on the knee without  buckling.     Toy Baker, MD 01/02/15 2322

## 2015-01-02 NOTE — Discharge Instructions (Signed)
Keep your appointment with your Dr. tomorrow Fall Prevention and Home Safety Falls cause injuries and can affect all age groups. It is possible to use preventive measures to significantly decrease the likelihood of falls. There are many simple measures which can make your home safer and prevent falls. OUTDOORS  Repair cracks and edges of walkways and driveways.  Remove high doorway thresholds.  Trim shrubbery on the main path into your home.  Have good outside lighting.  Clear walkways of tools, rocks, debris, and clutter.  Check that handrails are not broken and are securely fastened. Both sides of steps should have handrails.  Have leaves, snow, and ice cleared regularly.  Use sand or salt on walkways during winter months.  In the garage, clean up grease or oil spills. BATHROOM  Install night lights.  Install grab bars by the toilet and in the tub and shower.  Use non-skid mats or decals in the tub or shower.  Place a plastic non-slip stool in the shower to sit on, if needed.  Keep floors dry and clean up all water on the floor immediately.  Remove soap buildup in the tub or shower on a regular basis.  Secure bath mats with non-slip, double-sided rug tape.  Remove throw rugs and tripping hazards from the floors. BEDROOMS  Install night lights.  Make sure a bedside light is easy to reach.  Do not use oversized bedding.  Keep a telephone by your bedside.  Have a firm chair with side arms to use for getting dressed.  Remove throw rugs and tripping hazards from the floor. KITCHEN  Keep handles on pots and pans turned toward the center of the stove. Use back burners when possible.  Clean up spills quickly and allow time for drying.  Avoid walking on wet floors.  Avoid hot utensils and knives.  Position shelves so they are not too high or low.  Place commonly used objects within easy reach.  If necessary, use a sturdy step stool with a grab bar when  reaching.  Keep electrical cables out of the way.  Do not use floor polish or wax that makes floors slippery. If you must use wax, use non-skid floor wax.  Remove throw rugs and tripping hazards from the floor. STAIRWAYS  Never leave objects on stairs.  Place handrails on both sides of stairways and use them. Fix any loose handrails. Make sure handrails on both sides of the stairways are as long as the stairs.  Check carpeting to make sure it is firmly attached along stairs. Make repairs to worn or loose carpet promptly.  Avoid placing throw rugs at the top or bottom of stairways, or properly secure the rug with carpet tape to prevent slippage. Get rid of throw rugs, if possible.  Have an electrician put in a light switch at the top and bottom of the stairs. OTHER FALL PREVENTION TIPS  Wear low-heel or rubber-soled shoes that are supportive and fit well. Wear closed toe shoes.  When using a stepladder, make sure it is fully opened and both spreaders are firmly locked. Do not climb a closed stepladder.  Add color or contrast paint or tape to grab bars and handrails in your home. Place contrasting color strips on first and last steps.  Learn and use mobility aids as needed. Install an electrical emergency response system.  Turn on lights to avoid dark areas. Replace light bulbs that burn out immediately. Get light switches that glow.  Arrange furniture to create clear  pathways. Keep furniture in the same place.  Firmly attach carpet with non-skid or double-sided tape.  Eliminate uneven floor surfaces.  Select a carpet pattern that does not visually hide the edge of steps.  Be aware of all pets. OTHER HOME SAFETY TIPS  Set the water temperature for 120 F (48.8 C).  Keep emergency numbers on or near the telephone.  Keep smoke detectors on every level of the home and near sleeping areas. Document Released: 11/09/2002 Document Revised: 05/20/2012 Document Reviewed:  02/08/2012 [facility] Patient Information 2015 [place], [place]. This information is not intended to replace advice given to you by your health care provider. Make sure you discuss any questions you have with your health care provider.

## 2015-01-02 NOTE — ED Notes (Signed)
Alerted lab to need for add on blood work, stated they would be able to run it with what had already been drawn.

## 2015-01-02 NOTE — ED Notes (Signed)
Bed: [id number] Expected date: 01/02/15 Expected time: 6:50 PM Means of arrival: Ambulance Comments: Leg weakness

## 2015-01-02 NOTE — ED Notes (Signed)
Pt from home via [facility] c/o bilateral leg weakness starting today. Pt reports to EMS he was walking out of the bathroom and his legs locked up. Pt denies this happening ever before. Pt alert and oriented x 4.

## 2015-01-03 ENCOUNTER — Telehealth: Payer: Self-pay | Admitting: Internal Medicine

## 2015-01-03 NOTE — Telephone Encounter (Signed)
error:[id number] ° °

## 2015-01-11 ENCOUNTER — Encounter: Payer: Self-pay | Admitting: Cardiology

## 2015-01-12 ENCOUNTER — Encounter: Payer: Self-pay | Admitting: Internal Medicine

## 2015-01-12 ENCOUNTER — Ambulatory Visit (HOSPITAL_BASED_OUTPATIENT_CLINIC_OR_DEPARTMENT_OTHER)
Admission: RE | Admit: 2015-01-12 | Discharge: 2015-01-12 | Disposition: A | Discharge status: Home / Self Care | Payer: Medicare Other | Source: Ambulatory Visit | Attending: Internal Medicine | Admitting: Internal Medicine

## 2015-01-12 ENCOUNTER — Ambulatory Visit (INDEPENDENT_AMBULATORY_CARE_PROVIDER_SITE_OTHER): Payer: Medicare Other | Admitting: Internal Medicine

## 2015-01-12 VITALS — BP 118/82 | HR 78 | Temp 98.0°F | Ht 67.0 in | Wt 213.2 lb

## 2015-01-12 DIAGNOSIS — W19XXXA Unspecified fall, initial encounter: Secondary | ICD-10-CM | POA: Diagnosis not present

## 2015-01-12 DIAGNOSIS — M25561 Pain in right knee: Secondary | ICD-10-CM

## 2015-01-12 DIAGNOSIS — S8992XA Unspecified injury of left lower leg, initial encounter: Secondary | ICD-10-CM | POA: Diagnosis not present

## 2015-01-12 DIAGNOSIS — R296 Repeated falls: Secondary | ICD-10-CM | POA: Diagnosis not present

## 2015-01-12 NOTE — Progress Notes (Signed)
Subjective:    Patient ID: [name], male    DOB: 09/11/1921, 81 y.o.   MRN: [id number]  DOS:  01/12/2015 Type of visit - description : ER follow-up Interval history: [name] FlesherWent to the ER 01/02/2015 after his legs gave out and he fell  injuring his knees. UA, total CK, BMP and hemoglobin within normal or at baseline. Sedimentation rate was 41. Today he confirms that his legs simply gave out, he landed on his knees. Denies any syncope, loss of consciousness, neck or head pain. No further falls.   Review of Systems  denies chest pain or palpitations No nausea or vomiting Since the fall, the right knee continue hurting. Left knee is okay. He is trying to lose weight and he has been successful, has changed his diet, lost about 7 pounds.   Past Medical History  Diagnosis Date  . A-fib     paroxysmal, s/p TEE-DCCV 03/2013;  was on coumadin , pt desired to stop it , no longer a candidate per cards (hemorrhoidal bleeding; falls)  . Tachy-brady syndrome     s/p pacemaker  . CAD (coronary artery disease)     a. MI angioplasty 93, b. s/p CABG 01/2006;   c.  NSTEMI 06/2010 - [facility]:  L-LAD ok, S-Dx ok, S-OM ok, S-PDA ok, small OM sub total occluded - Med Rx  . Chronic diastolic heart failure   . Hyperlipidemia   . Hypertension   . Spinal stenosis      has seen Dr Ethelene Halamos before, s/p local injection which helped x a while   . GERD (gastroesophageal reflux disease)   . RLS (restless legs syndrome)     ? of  . Insomnia   . History of BPH     TURP '96 at [facility] 96, urethoplast 97 ( History of bladder outlet obstruction, chronic incontinence)  . Pacemaker   . B12 deficiency anemia   . Myocardial infarction 01/1992; 06/2010  . Arthritis     back  . Hx of cardiovascular stress test     a.  Myoview (10/2005):  EF 62%, mild apical ischemia  . Hx of echocardiogram     a.  Echo (03/23/13):  mod LVH, EF 50-55%, no WMA, mild MR, mod LAE, mild RVE, mod RAE, mod TR  . Atrial flutter     Past Surgical  History  Procedure Laterality Date  . Coronary artery bypass graft      "CABG X4" (05/14/2013)  . Cystoscopy with urethral dilatation  1997    "fixed strictures from TURP" (05/14/2013)  . Inguinal hernia repair Right   . Carpal tunnel release Bilateral ~ 2005    Dr Teressa SenterSypher  . Insert / replace / remove pacemaker  12/2006    PPM Medtronic  . Tee without cardioversion N/A 03/27/2013    Procedure: TRANSESOPHAGEAL ECHOCARDIOGRAM (TEE);  Surgeon: Lewayne BuntingBrian S Crenshaw, MD;  [facility];  Service: Cardiovascular;  Laterality: N/A;  . Cardioversion N/A 03/27/2013    Procedure: CARDIOVERSION;  Surgeon: Lewayne BuntingBrian S Crenshaw, MD;  [facility];  Service: Cardiovascular;  Laterality: N/A;  . Total knee arthroplasty Bilateral     right 2005, Left 2006.  Dr  Despina HickAlusio  . Coronary angioplasty      "Dr. Riley KillStuckey"  . Cataract extraction w/ intraocular lens  implant, bilateral Bilateral 2012  . Transurethral resection of prostate  1996    History   Social History  . Marital Status: Married    Spouse Name: N/A  .  Number of Children: 2  . Years of Education: N/A   Occupational History  . retired     Social History Main Topics  . Smoking status: Former Smoker -- 2.00 packs/day for 15 years    Types: Cigarettes    Quit date: 02/01/1959  . Smokeless tobacco: Never Used  . Alcohol Use: No  . Drug Use: No  . Sexual Activity: Not Currently   Other Topics Concern  . Not on file   Social History Narrative   Lives at home  w/ wife, wife drives    2 children, son lives  in town [place], [place])        Medication List       This list is accurate as of: 01/12/15 11:59 PM.  Always use your most recent med list.               acetaminophen 650 MG CR tablet  Commonly known as:  TYLENOL  Take 650 mg by mouth at bedtime as needed for pain (pain).     acetaminophen 500 MG tablet  Commonly known as:  TYLENOL  Take 500 mg by mouth every 8 (eight) hours as needed for moderate pain (pain).      B-complex with vitamin C tablet  Take 1 tablet by mouth daily.     beta carotene w/minerals tablet  Take 1 tablet by mouth daily.     carvedilol 6.25 MG tablet  Commonly known as:  COREG  TAKE 1 TABLET TWO TIMES A DAY WITH A MEAL     potassium chloride 10 MEQ CR capsule  Commonly known as:  MICRO-K  Take one tablet by mouth daily alternating with two tablets by mouth daily     pramipexole 0.125 MG tablet  Commonly known as:  MIRAPEX  TAKE 1 TO 2 TABLETS BY MOUTH AT BEDTIME AS NEEDED FOR RESTLESS LEGS     torsemide 20 MG tablet  Commonly known as:  DEMADEX  Take 1 tablet (20 mg total) by mouth daily.           Objective:   Physical Exam  Constitutional: He is oriented to person, place, and time. He appears well-developed. No distress.  HENT:  Head: Normocephalic and atraumatic.  Cardiovascular:  RRR, no murmur, rub or gallop  Pulmonary/Chest: Effort normal. No respiratory distress.  CTA B  Musculoskeletal: Normal range of motion. He exhibits no edema or tenderness.  Left knee without excoriation over the patellae without evidence of cellulitis Left knee: No deformities, swelling, redness, range of motion is pretty good.  Neurological: He is alert and oriented to person, place, and time.  Speech normal, gait assisted by a walker. He is able to maneuver his walker okay and looks stable. His able to get up from the chair okay as well.  Skin: Skin is warm and dry. No pallor.  Psychiatric: He has a normal mood and affect. His behavior is normal. Judgment and thought content normal.  Vitals reviewed.        Assessment & Plan:    knee pain, get a   x-ray    Problem List Items Addressed This Visit      Other   Recurrent falls    Had a fall a few days ago at home, no syncope, no major injury except knee pain. He seems to be doing okay, has lost some weight and apparently that has helped his mobility. We again had a conversation about the need to use the walker  consistently . Also, the patient and his wife already talking with the family about moving to a assisted-living facility.        Other Visit Diagnoses    Right knee pain    -  Primary    Relevant Orders    DG Knee Complete 4 Views Right (Completed)

## 2015-01-12 NOTE — Assessment & Plan Note (Signed)
Had a fall a few days ago at home, no syncope, no major injury except knee pain. He seems to be doing okay, has lost some weight and apparently that has helped his mobility. We again had a conversation about the need to use the walker consistently . Also, the patient and his wife already talking with the family about moving to a assisted-living facility.

## 2015-01-12 NOTE — Progress Notes (Signed)
Pre visit review using our clinic review tool, if applicable. No additional management support is needed unless otherwise documented below in the visit note. 

## 2015-01-12 NOTE — Patient Instructions (Signed)
Get your x-ray downstairs Use a walker consistently  Next visit with me in 2 or 3 months

## 2015-01-13 ENCOUNTER — Ambulatory Visit: Payer: Medicare Other | Admitting: Internal Medicine

## 2015-01-31 ENCOUNTER — Ambulatory Visit (INDEPENDENT_AMBULATORY_CARE_PROVIDER_SITE_OTHER): Payer: Medicare Other | Admitting: *Deleted

## 2015-01-31 DIAGNOSIS — Z950 Presence of cardiac pacemaker: Secondary | ICD-10-CM

## 2015-01-31 NOTE — Progress Notes (Signed)
Remote pacemaker transmission.   

## 2015-02-02 LAB — MDC_IDC_ENUM_SESS_TYPE_REMOTE
Battery Impedance: 4316 Ohm
Battery Voltage: 2.62 V
Brady Statistic AP VS Percent: 0 %
Brady Statistic AS VP Percent: 15 %
Date Time Interrogation Session: 20160229180139
Lead Channel Impedance Value: 456 Ohm
Lead Channel Setting Pacing Amplitude: 2 V
Lead Channel Setting Pacing Amplitude: 2.5 V
Lead Channel Setting Sensing Sensitivity: 1.4 mV
MDC IDC MSMT BATTERY REMAINING LONGEVITY: 5 mo
MDC IDC MSMT LEADCHNL RV IMPEDANCE VALUE: 480 Ohm
MDC IDC SET LEADCHNL RV PACING PULSEWIDTH: 0.4 ms
MDC IDC STAT BRADY AP VP PERCENT: 85 %
MDC IDC STAT BRADY AS VS PERCENT: 0 %

## 2015-02-15 ENCOUNTER — Other Ambulatory Visit: Payer: Self-pay | Admitting: Internal Medicine

## 2015-02-15 NOTE — Telephone Encounter (Signed)
Pt is requesting refill on Mirapex.  Last OV: 01/12/2015 Last Fill: 09/17/2014 # [phone]  Please advise.

## 2015-02-15 NOTE — Telephone Encounter (Signed)
Okay to refill #60 and 6 RF

## 2015-02-16 ENCOUNTER — Encounter: Payer: Self-pay | Admitting: Cardiovascular Disease

## 2015-02-16 ENCOUNTER — Ambulatory Visit (INDEPENDENT_AMBULATORY_CARE_PROVIDER_SITE_OTHER): Payer: Medicare Other | Admitting: Cardiovascular Disease

## 2015-02-16 VITALS — BP 122/78 | HR 76 | Ht 67.0 in | Wt 213.0 lb

## 2015-02-16 DIAGNOSIS — I5032 Chronic diastolic (congestive) heart failure: Secondary | ICD-10-CM | POA: Diagnosis not present

## 2015-02-16 MED ORDER — TORSEMIDE 20 MG PO TABS: 10.0000 mg | ORAL_TABLET | Freq: Every day | ORAL | Status: DC

## 2015-02-16 NOTE — Patient Instructions (Addendum)
Your physician has recommended you make the following change in your medication:  1. DECREASE Torsemide to 20mg  take one-half tabley by mouth daily Please take Torsemide 20mg  daily if you have a 3 pound weight gain in a 24 hour period or 5 pound weight gain in a week. Continue this dosage until your weight returns to baseline. Please call the office if you have to take this dosage for more than 3 days in a row.   Your physician recommends that you return for lab work in: 6 MONTHS (BMP)  Your physician wants you to follow-up in: 6 MONTHS with Dr Excell Seltzerooper.  You will receive a reminder letter in the mail two months in advance. If you don't receive a letter, please call our office to schedule the follow-up appointment.

## 2015-02-16 NOTE — Progress Notes (Signed)
Cardiology Office Note   Date:  02/16/2015   ID:  [name], DOB 24-Nov-1921, MRN [id number]  PCP:  Willow Ora, MD  Cardiologist:  Tonny Bollman, MD    Chief Complaint  Patient presents with  . Medication Problem    torsemide     History of Present Illness: [name] is a 81 y.o. male who presents for follow-up evaluation.  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 Last cath in 06/2010 in setting of NSTEMI demonstrated patent grafts and subtotal occlusion of marginal branch treated medically. He is not on anticoagulation due to hemorrhoidal bleeding and frequent falls (Xarelto stopped in 2014). Has had chronic leg swelling requiring a loop diuretic therapy with torsemide. We have had to carefully balance his diuretic dosage because of tenuous renal function.  He's having a lot of trouble with urinary incontinence. Notes very frequent urination when he takes torsemide and the effect is prolonged at times. Otherwise reports no change in symptoms. He denies chest pain. Has mild DOE but doesn't exert himself much anymore. He continues to have problems with gait instability.   Past Medical History  Diagnosis Date  . A-fib     paroxysmal, s/p TEE-DCCV 03/2013;  was on coumadin , pt desired to stop it , no longer a candidate per cards (hemorrhoidal bleeding; falls)  . Tachy-brady syndrome     s/p pacemaker  . CAD (coronary artery disease)     a. MI angioplasty 93, b. s/p CABG 01/2006;   c.  NSTEMI 06/2010 - [facility]:  L-LAD ok, S-Dx ok, S-OM ok, S-PDA ok, small OM sub total occluded - Med Rx  . Chronic diastolic heart failure   . Hyperlipidemia   . Hypertension   . Spinal stenosis      has seen Dr Ethelene Hal before, s/p local injection which helped x a while    . GERD (gastroesophageal reflux disease)   . RLS (restless legs syndrome)     ? of  . Insomnia   . History of BPH     TURP '96 at [facility] 96, urethoplast 97 ( History of bladder outlet obstruction, chronic incontinence)  . Pacemaker   . B12 deficiency anemia   . Myocardial infarction 01/1992; 06/2010  . Arthritis     back  . Hx of cardiovascular stress test     a.  Myoview (10/2005):  EF 62%, mild apical ischemia  . Hx of echocardiogram     a.  Echo (03/23/13):  mod LVH, EF 50-55%, no WMA, mild MR, mod LAE, mild RVE, mod RAE, mod TR  . Atrial flutter     Past Surgical History  Procedure Laterality Date  . Coronary artery bypass graft      "CABG X4" (05/14/2013)  . Cystoscopy with urethral dilatation  1997    "fixed strictures from TURP" (05/14/2013)  . Inguinal hernia repair Right   . Carpal tunnel release Bilateral ~ 2005    Dr Teressa Senter  . Insert / replace / remove pacemaker  12/2006    PPM [employer]tr[employer]  . Tee without cardioversion N/A 03/27/2013    Procedure: TRANSESOPHAGEAL ECHOCARDIOGRAM (TEE);  Surgeon: Lewayne Bunting, MD;  [facility] END[facility]COPY;  Service: Cardiovascular;  Laterality: N/A;  . Cardioversion N/A 03/27/2013    Procedure: CARDIOVERSION;  Surgeon: Madolyn Frieze  Jens Som, MD;  Location: [facility] ENDOSCOPY;  Service: Cardiovascular;  Laterality: N/A;  . Total knee arthroplasty Bilateral     right 2005, Left 2006.  Dr  Despina Hick  . Coronary angioplasty      "Dr. Riley Kill"  . Cataract extraction w/ intraocular lens  implant, bilateral Bilateral 2012  . Transurethral resection of prostate  1996    Current Outpatient Prescriptions  Medication Sig Dispense Refill  . potassium chloride (K-DUR) 10 MEQ tablet Take 10 mEq by mouth daily. Take one tablet by mouth daily alternating with two tablets by mouth daily    . acetaminophen (TYLENOL) 500 MG tablet Take 500 mg by mouth every 8 (eight) hours as needed for moderate pain (pain).     Marland Kitchen acetaminophen (TYLENOL) 650 MG CR tablet Take 650 mg by  mouth at bedtime as needed for pain (pain).    . B Complex-C (B-COMPLEX WITH VITAMIN C) tablet Take 1 tablet by mouth daily.    . beta carotene w/minerals (OCUVITE) tablet Take 1 tablet by mouth daily.    . carvedilol (COREG) 6.25 MG tablet TAKE 1 TABLET TWO TIMES A DAY WITH A MEAL 60 tablet 3  . pramipexole (MIRAPEX) 0.125 MG tablet TAKE 1 TO 2 TABLETS BY MOUTH AT BEDTIME AS NEEDED FOR RESTLESS LEGS 60 tablet 6  . torsemide (DEMADEX) 20 MG tablet Take 1 tablet (20 mg total) by mouth daily. 30 tablet 11   No current facility-administered medications for this visit.    Allergies:   Amlodipine; Atorvastatin; and Zinc   Social History:  The patient  reports that he quit smoking about 56 years ago. His smoking use included Cigarettes. He has a 30 pack-year smoking history. He has never used smokeless tobacco. He reports that he does not drink alcohol or use illicit drugs.   Family History:  The patient's family history is negative for Heart disease and Diabetes.   ROS: positive for hearing loss, urinary incontinence, frequent falls. Otherwise negative except as per HPI.   PHYSICAL EXAM: VS:  BP 122/78 mmHg  Pulse 76  Ht  (1.702 m)  Wt 213 lb (96.616 kg)  BMI 33.35 kg/m2  SpO2 97% , BMI Body mass index is 33.35 kg/(m^2). GEN: Well nourished, well developed, pleasant elderly male in no acute distress HEENT: normal Neck: no JVD, no masses. No carotid bruits Cardiac: RRR with with 2/6 SEM at the RUSB               Respiratory:  clear to auscultation bilaterally, normal work of breathing GI: soft, nontender, nondistended, + BS MS: no deformity or atrophy Ext: trace pretibial edema, pedal pulses 2+= bilaterally Skin: warm and dry, no rash Neuro:  Strength and sensation are intact Psych: euthymic mood, full affect  EKG:  EKG is not ordered today.  Recent Labs: 07/15/2014: TSH 0.81 01/02/2015: BUN 21; Creatinine 1.11; Hemoglobin 11.6*; Platelets 192; Potassium 3.9; Sodium 137    Lipid Panel     Component Value Date/Time   CHOL  06/05/2010 0435    158        ATP III CLASSIFICATION:  <200     mg/dL   Desirable  161-096  mg/dL   Borderline High  >=045    mg/dL   High          TRIG 82 06/05/2010 0435   HDL 30* 06/05/2010 0435   CHOLHDL 5.3 06/05/2010 0435   VLDL 16 06/05/2010 0435   LDLCALC * 06/05/2010 0435  112        Total Cholesterol/HDL:CHD Risk Coronary Heart Disease Risk Table                     Men   Women  1/2 Average Risk   3.4   3.3  Average Risk       5.0   4.4  2 X Average Risk   9.6   7.1  3 X Average Risk  23.4   11.0        Use the calculated Patient Ratio above and the CHD Risk Table to determine the patient's CHD Risk.        ATP III CLASSIFICATION (LDL):  <100     mg/dL   Optimal  [id number]  mg/dL   Near or Above                    Optimal  130-159  mg/dL   Borderline  [id number]  mg/dL   High  >962>190     mg/dL   Very High      Wt Readings from Last 3 Encounters:  02/16/15 213 lb (96.616 kg)  01/12/15 213 lb 4 oz (96.73 kg)  08/18/14 220 lb (99.791 kg)    ASSESSMENT AND PLAN: 1.  Chronic diastolic heart failure. Appears clinically stable. Leg swelling improved from past visits and no limitation from dyspnea. Considering his problems with urinary incontinence exacerbated by torsemide, advised to reduce dose to 10 mg daily.   2. Atrial fibrillation. S/p PPM. No anticoagulation because of recurrent falls. Followed by Dr Graciela HusbandsKlein.  3. Hyperlipidemia: statin-intolerant. At age 81 we will observe.  4. HTN: controlled on coreg.  5. CAD s/p CABG: no angina.   Current medicines are reviewed with the patient today.  The patient does not have concerns regarding medicines.  The following changes have been made:   Decrease torsemide to 10 mg daily.  Labs/ tests ordered today include:  No orders of the defined types were placed in this encounter.    Disposition:   FU 6 months  Signed, Tonny BollmanMichael Breeann Reposa, MD  02/16/2015 3:03 PM     [facility] [address], [place], [place]  [address] Phone: [phone]; Fax: [phone]

## 2015-03-07 ENCOUNTER — Ambulatory Visit (INDEPENDENT_AMBULATORY_CARE_PROVIDER_SITE_OTHER): Payer: Medicare Other | Admitting: *Deleted

## 2015-03-07 ENCOUNTER — Encounter: Payer: Self-pay | Admitting: Internal Medicine

## 2015-03-07 DIAGNOSIS — Z950 Presence of cardiac pacemaker: Secondary | ICD-10-CM

## 2015-03-07 LAB — MDC_IDC_ENUM_SESS_TYPE_REMOTE
Battery Impedance: 4996 Ohm
Battery Remaining Longevity: 1 mo
Battery Voltage: 2.61 V
Brady Statistic AP VP Percent: 86 %
Brady Statistic AP VS Percent: 0 %
Brady Statistic AS VP Percent: 13 %
Date Time Interrogation Session: 20160404143511
Lead Channel Impedance Value: 461 Ohm
Lead Channel Pacing Threshold Amplitude: 0.625 V
Lead Channel Pacing Threshold Amplitude: 0.625 V
Lead Channel Pacing Threshold Pulse Width: 0.4 ms
Lead Channel Setting Pacing Amplitude: 2 V
Lead Channel Setting Pacing Amplitude: 2.5 V
Lead Channel Setting Sensing Sensitivity: 1.4 mV
MDC IDC MSMT LEADCHNL RV IMPEDANCE VALUE: 450 Ohm
MDC IDC MSMT LEADCHNL RV PACING THRESHOLD PULSEWIDTH: 0.4 ms
MDC IDC SET LEADCHNL RV PACING PULSEWIDTH: 0.4 ms
MDC IDC STAT BRADY AS VS PERCENT: 0 %

## 2015-03-07 NOTE — Progress Notes (Signed)
Remote pacemaker transmission.   

## 2015-03-08 ENCOUNTER — Encounter: Payer: Self-pay | Admitting: Cardiology

## 2015-03-16 ENCOUNTER — Encounter: Payer: Self-pay | Admitting: Internal Medicine

## 2015-04-07 ENCOUNTER — Ambulatory Visit (INDEPENDENT_AMBULATORY_CARE_PROVIDER_SITE_OTHER): Payer: Medicare Other | Admitting: *Deleted

## 2015-04-07 DIAGNOSIS — I495 Sick sinus syndrome: Secondary | ICD-10-CM

## 2015-04-08 LAB — CUP PACEART REMOTE DEVICE CHECK
Battery Impedance: 5818 Ohm
Battery Voltage: 2.63 V
Brady Statistic RV Percent Paced: 99 %
Date Time Interrogation Session: 20160505155819
Lead Channel Impedance Value: 407 Ohm
Lead Channel Setting Pacing Amplitude: 2.5 V
Lead Channel Setting Pacing Pulse Width: 0.4 ms
Lead Channel Setting Sensing Sensitivity: 1.4 mV
MDC IDC MSMT BATTERY REMAINING LONGEVITY: 4 mo
MDC IDC MSMT LEADCHNL RA IMPEDANCE VALUE: 67 Ohm

## 2015-04-08 NOTE — Progress Notes (Signed)
Pacemaker remote check received. Device reached ERI on 03-30-15 and reverted to VVI 65. 6 ventricular high rate episodes noted--longest was 1 minute 25 secconds. Spoke w/wife and pt has had no remarkable symptoms to associate with device being reverted. On last check, pt was AP 85%. Pt was scheduled for 04-25-15 @ 1600 with SK.

## 2015-04-12 ENCOUNTER — Other Ambulatory Visit: Payer: Self-pay

## 2015-04-12 ENCOUNTER — Encounter: Payer: Self-pay | Admitting: Internal Medicine

## 2015-04-12 ENCOUNTER — Ambulatory Visit (INDEPENDENT_AMBULATORY_CARE_PROVIDER_SITE_OTHER): Payer: Medicare Other | Admitting: Internal Medicine

## 2015-04-12 VITALS — BP 120/78 | HR 65 | Temp 97.8°F | Ht 67.0 in | Wt 205.4 lb

## 2015-04-12 DIAGNOSIS — R269 Unspecified abnormalities of gait and mobility: Secondary | ICD-10-CM | POA: Diagnosis not present

## 2015-04-12 DIAGNOSIS — I10 Essential (primary) hypertension: Secondary | ICD-10-CM

## 2015-04-12 LAB — BASIC METABOLIC PANEL
BUN: 17 mg/dL (ref 6–23)
CALCIUM: 8.8 mg/dL (ref 8.4–10.5)
CO2: 27 meq/L (ref 19–32)
CREATININE: 0.99 mg/dL (ref 0.40–1.50)
Chloride: 106 mEq/L (ref 96–112)
GFR: 75.25 mL/min (ref >60.00)
Glucose, Bld: 102 mg/dL — ABNORMAL HIGH (ref 70–99)
Potassium: 4.6 mEq/L (ref 3.5–5.1)
SODIUM: 138 meq/L (ref 135–145)

## 2015-04-12 MED ORDER — CARVEDILOL 6.25 MG PO TABS: 6.2500 mg | ORAL_TABLET | Freq: Two times a day (BID) | ORAL | Status: AC

## 2015-04-12 MED ORDER — PRAMIPEXOLE DIHYDROCHLORIDE 0.125 MG PO TABS: ORAL_TABLET | ORAL | Status: DC

## 2015-04-12 NOTE — Patient Instructions (Signed)
Get your blood work before you leave   Come back to the office by October 2016   for a physical exam

## 2015-04-12 NOTE — Assessment & Plan Note (Signed)
saw cardiology, note reviewed, they decrease the diuretics due to frequent urination. BP today is very good, check a BMP

## 2015-04-12 NOTE — Assessment & Plan Note (Signed)
Frequent falls, he is using his walker consistently, no recent problems

## 2015-04-12 NOTE — Progress Notes (Signed)
Subjective:    Patient ID: [name], male    DOB: February 15, 1921, 81 y.o.   MRN: [id number]  DOS:  04/12/2015 Type of visit - description : rov, here with his wife Interval history: In general feeling well. No recent falls. Insomnia is better. History of RLS, needs a refill on Mirapex. Noted some pain at the right thumb is improving already, does not recall any injury.   Review of Systems Denies chest pain or difficulty breathing No nausea, vomiting, diarrhea  Past Medical History  Diagnosis Date  . A-fib     paroxysmal, s/p TEE-DCCV 03/2013;  was on coumadin , pt desired to stop it , no longer a candidate per cards (hemorrhoidal bleeding; falls)  . Tachy-brady syndrome     s/p pacemaker  . CAD (coronary artery disease)     a. MI angioplasty 93, b. s/p CABG 01/2006;   c.  NSTEMI 06/2010 - [facility]:  L-LAD ok, S-Dx ok, S-OM ok, S-PDA ok, small OM sub total occluded - Med Rx  . Chronic diastolic heart failure   . Hyperlipidemia   . Hypertension   . Spinal stenosis      has seen Dr Ethelene Halamos before, s/p local injection which helped x a while   . GERD (gastroesophageal reflux disease)   . RLS (restless legs syndrome)     ? of  . Insomnia   . History of BPH     TURP '96 at [facility] 96, urethoplast 97 ( History of bladder outlet obstruction, chronic incontinence)  . Pacemaker   . B12 deficiency anemia   . Myocardial infarction 01/1992; 06/2010  . Arthritis     back  . Hx of cardiovascular stress test     a.  Myoview (10/2005):  EF 62%, mild apical ischemia  . Hx of echocardiogram     a.  Echo (03/23/13):  mod LVH, EF 50-55%, no WMA, mild MR, mod LAE, mild RVE, mod RAE, mod TR  . Atrial flutter     Past Surgical History  Procedure Laterality Date  . Coronary artery bypass graft      "CABG X4" (05/14/2013)  . Cystoscopy with urethral dilatation  1997    "fixed strictures from TURP" (05/14/2013)  . Inguinal hernia repair Right   . Carpal tunnel release Bilateral ~ 2005    Dr Teressa SenterSypher    . Insert / replace / remove pacemaker  12/2006    PPM [employer]tr[employer]  . Tee without cardioversion N/A 03/27/2013    Procedure: TRANSESOPHAGEAL ECHOCARDIOGRAM (TEE);  Surgeon: Lewayne BuntingBrian S Crenshaw, MD;  [facility];  Service: Cardiovascular;  Laterality: N/A;  . Cardioversion N/A 03/27/2013    Procedure: CARDIOVERSION;  Surgeon: Lewayne BuntingBrian S Crenshaw, MD;  [facility];  Service: Cardiovascular;  Laterality: N/A;  . Total knee arthroplasty Bilateral     right 2005, Left 2006.  Dr  Despina HickAlusio  . Coronary angioplasty      "Dr. Riley KillStuckey"  . Cataract extraction w/ intraocular lens  implant, bilateral Bilateral 2012  . Transurethral resection of prostate  1996    History   Social History  . Marital Status: Married    Spouse Name: N/A  . Number of Children: 2  . Years of Education: N/A   Occupational History  . retired     Social History Main Topics  . Smoking status: Former Smoker -- 2.00 packs/day for 15 years    Types: Cigarettes    Quit date: 02/01/1959  . Smokeless tobacco: Never Used  .  Alcohol Use: No  . Drug Use: No  . Sexual Activity: Not Currently   Other Topics Concern  . Not on file   Social History Narrative   Lives at home  w/ wife, wife drives    2 children, son lives  in [place]        Medication List       This list is accurate as of: 04/12/15  6:13 PM.  Always use your most recent med list.               acetaminophen 650 MG CR tablet  Commonly known as:  TYLENOL  Take 650 mg by mouth at bedtime as needed for pain (pain).     acetaminophen 500 MG tablet  Commonly known as:  TYLENOL  Take 500 mg by mouth every 8 (eight) hours as needed for moderate pain (pain).     B-complex with vitamin C tablet  Take 1 tablet by mouth daily.     beta carotene w/minerals tablet  Take 1 tablet by mouth daily.     carvedilol 6.25 MG tablet  Commonly known as:  COREG  Take 1 tablet (6.25 mg total) by mouth 2 (two) times daily with a meal.      potassium chloride 10 MEQ tablet  Commonly known as:  K-DUR  Take 10 mEq by mouth daily. Take one tablet by mouth daily alternating with two tablets by mouth daily     pramipexole 0.125 MG tablet  Commonly known as:  MIRAPEX  Take 1 to 2 tablets by mouth at bedtime as needed for restless legs.     torsemide 20 MG tablet  Commonly known as:  DEMADEX  Take 0.5 tablets (10 mg total) by mouth daily.           Objective:   Physical Exam BP 120/78 mmHg  Pulse 65  Temp(Src) 97.8 F (36.6 C) (Oral)  Ht 5\' 7"  (1.702 m)  Wt 205 lb 6 oz (93.157 kg)  BMI 32.16 kg/m2  SpO2 97%  General:   Well developed, well nourished . NAD.  HEENT:  Normocephalic . Face symmetric, atraumatic Lungs:  CTA B Normal respiratory effort, no intercostal retractions, no accessory muscle use. Heart: regular ,  no murmur.  +/+++ pretibial edema bilaterally  Skin: Not pale. Not jaundice MSK: Right thumb nail, he has a very small area at the proximal corner that is dystrophic otherwise fingers normal Neurologic:  alert & oriented X3.  Speech normal, gait slow and  assisted by a walker  Psych--  Cognition and judgment appear intact.  Cooperative with normal attention span and concentration.  Behavior appropriate. No anxious or depressed appearing.       Assessment & Plan:    RLS, refill Mirapex.  Also complaining of hard of hearing, already saw an audiologist, at some point will get hearing aids but cost is an issue  Dystrophic nail, recommend observation for now

## 2015-04-12 NOTE — Progress Notes (Signed)
Pre visit review using our clinic review tool, if applicable. No additional management support is needed unless otherwise documented below in the visit note. 

## 2015-04-21 DIAGNOSIS — Z7689 Persons encountering health services in other specified circumstances: Secondary | ICD-10-CM

## 2015-04-25 ENCOUNTER — Encounter: Payer: Self-pay | Admitting: Internal Medicine

## 2015-04-25 ENCOUNTER — Ambulatory Visit (INDEPENDENT_AMBULATORY_CARE_PROVIDER_SITE_OTHER): Payer: Medicare Other | Admitting: Internal Medicine

## 2015-04-25 VITALS — BP 120/72 | HR 56 | Ht 67.0 in | Wt 212.8 lb

## 2015-04-25 DIAGNOSIS — I480 Paroxysmal atrial fibrillation: Secondary | ICD-10-CM | POA: Diagnosis not present

## 2015-04-25 NOTE — Progress Notes (Signed)
Patient Care Team: Wanda Plump, MD as PCP - General   HPI  [name] is a 81 y.o. male Seen in followup for a pacemaker implanted for tachybradycardia syndrome. He also has a history of nonsustained ventricular tachycardia He has a history of coronary artery disease with prior bypass grafting.   He continues to complain of peripheral edema and dyspnea on exertion. His salt intake is quite good today; his fluid intake however is quite generous.  No palps, no LH  He was on amiodarone for atrial fibrillation but this was stopped because of leg weakness.   He underwent cardiac catheterization in 2011 when he presented with a non-ST elevation infarction. This demonstrated patency of his LIMA to LAD graft as well as the vein graft to diagonal, obtuse marginal, and PDA. He had subtotal occlusion of a small OM branch and medical therapy was recommended.  Echo 4/14 showed near normalization of LV function (50%)  WMA present   Saw DR [facility] a few weeks ago and diuretics decreases 2/2 LH and concerns of renal function   Cr- 0.99 on 5/16 <<<1.9 3/15   Past Medical History  Diagnosis Date  . A-fib     paroxysmal, s/p TEE-DCCV 03/2013;  was on coumadin , pt desired to stop it , no longer a candidate per cards (hemorrhoidal bleeding; falls)  . Tachy-brady syndrome     s/p pacemaker  . CAD (coronary artery disease)     a. MI angioplasty 93, b. s/p CABG 01/2006;   c.  NSTEMI 06/2010 - [facility]:  L-LAD ok, S-Dx ok, S-OM ok, S-PDA ok, small OM sub total occluded - Med Rx  . Chronic diastolic heart failure   . Hyperlipidemia   . Hypertension   . Spinal stenosis      has seen Dr Ethelene Hal before, s/p local injection which helped x a while   . GERD (gastroesophageal reflux disease)   . RLS (restless legs syndrome)     ? of  . Insomnia   . History of BPH     TURP '96 at [facility] 96, urethoplast 97 ( History of bladder outlet obstruction, chronic incontinence)  . Pacemaker   . B12 deficiency anemia   .  Myocardial infarction 01/1992; 06/2010  . Arthritis     back  . Hx of cardiovascular stress test     a.  Myoview (10/2005):  EF 62%, mild apical ischemia  . Hx of echocardiogram     a.  Echo (03/23/13):  mod LVH, EF 50-55%, no WMA, mild MR, mod LAE, mild RVE, mod RAE, mod TR  . Atrial flutter     Past Surgical History  Procedure Laterality Date  . Coronary artery bypass graft      "CABG X4" (05/14/2013)  . Cystoscopy with urethral dilatation  1997    "fixed strictures from TURP" (05/14/2013)  . Inguinal hernia repair Right   . Carpal tunnel release Bilateral ~ 2005    Dr Teressa Senter  . Insert / replace / remove pacemaker  12/2006    PPM Medtronic  . Tee without cardioversion N/A 03/27/2013    Procedure: TRANSESOPHAGEAL ECHOCARDIOGRAM (TEE);  Surgeon: Lewayne Bunting, MD;  Location: [facility] ENDOSCOPY;  Service: Cardiovascular;  Laterality: N/A;  . Cardioversion N/A 03/27/2013    Procedure: CARDIOVERSION;  Surgeon: Lewayne Bunting, MD;  Location: [facility] ENDOSCOPY;  Service: Cardiovascular;  Laterality: N/A;  . Total knee arthroplasty Bilateral     right 2005, Left 2006.  Dr  Despina HickAlusio  . Coronary angioplasty      "Dr. Riley KillStuckey"  . Cataract extraction w/ intraocular lens  implant, bilateral Bilateral 2012  . Transurethral resection of prostate  1996    Current Outpatient Prescriptions  Medication Sig Dispense Refill  . acetaminophen (TYLENOL) 500 MG tablet Take 500 mg by mouth every 8 (eight) hours as needed for moderate pain (pain).     Marland Kitchen. acetaminophen (TYLENOL) 650 MG CR tablet Take 650 mg by mouth at bedtime as needed for pain (pain).    . B Complex-C (B-COMPLEX WITH VITAMIN C) tablet Take 1 tablet by mouth daily.    . beta carotene w/minerals (OCUVITE) tablet Take 1 tablet by mouth daily.    . carvedilol (COREG) 6.25 MG tablet Take 1 tablet (6.25 mg total) by mouth 2 (two) times daily with a meal. 60 tablet 6  . potassium chloride (K-DUR) 10 MEQ tablet Take one tablet by mouth daily alternating with  two tablets by mouth daily.    . pramipexole (MIRAPEX) 0.125 MG tablet Take 1 to 2 tablets by mouth at bedtime as needed for restless legs. 60 tablet 6  . torsemide (DEMADEX) 20 MG tablet Take 0.5 tablets (10 mg total) by mouth daily. 30 tablet 6   No current facility-administered medications for this visit.    Allergies  Allergen Reactions  . Amlodipine Other (See Comments)    Feet swelled  . Atorvastatin Other (See Comments)     leg pain  . Zinc Other (See Comments)    Scaly rash    Review of Systems negative except from HPI and PMH  Physical Exam BP 120/72 mmHg  Pulse 56  Ht 5\' 7"  (1.702 m)  Wt 96.525 kg (212 lb 12.8 oz)  BMI 33.32 kg/m2 Well developed and well nourished in no acute distress HENT normal E scleral and icterus clear Neck Supple JVP 8-9 carotids brisk and full Clear to ausculation Device pocket well healed; without hematoma or erythema.  There is no tethering Regular rate and rhythm, no murmurs gallops or rub Soft with active bowel sounds No clubbing cyanosis 2+ Edema Alert and oriented, grossly normal motor and sensory function Skin Warm and Dry  ECG demonstrates AV pacing   Assessment and  Plan  Atrial fibrillation-paroxysmal   Ischemic Cardiomyopathy  HFpEF   Hypertension   Implantable pacemaker-Medtronic The patient's device was interrogated.  The information was reviewed. No changes were made in the programming.    Chronic kidney disease   Modest volumr overload, but with renal function will not adjust diuretics  No ischemia  BP well controlled  No intercurrent atrial fibrillation or flutter

## 2015-04-25 NOTE — Patient Instructions (Signed)
Medication Instructions: - no change  Labwork: - none  Procedures/Testing: - none  Follow-Up: - Your physician recommends that you schedule a follow-up appointment in: the week of 06/13/15 to see Gypsy BalsamAmber Seiler, NP for a work up for your generator change  Any Additional Special Instructions Will Be Listed Below (If Applicable). - none

## 2015-04-26 ENCOUNTER — Telehealth: Payer: Self-pay | Admitting: *Deleted

## 2015-04-26 NOTE — Telephone Encounter (Signed)
Pt dropped off independent living physician's report that is needed for [facility]. Forms filled out as much as possible and forwarded to Dr. Drue NovelPaz. JG//CMA

## 2015-04-28 NOTE — Telephone Encounter (Signed)
Forms completed by Dr. Drue NovelPaz. Forms mailed to [facility], Attn: Maralyn SagoSarah JamaicaFrench at [address] [place], [place] [address] (per pt request). Copy sent for scanning. JG/CMA

## 2015-04-29 ENCOUNTER — Telehealth: Payer: Self-pay | Admitting: *Deleted

## 2015-04-29 NOTE — Telephone Encounter (Signed)
signed

## 2015-04-29 NOTE — Telephone Encounter (Signed)
Pt dropped of [facility] [employer]ac[employer] application. Forwarded to Dr. Drue NovelPaz. JG//CMA

## 2015-05-03 NOTE — Telephone Encounter (Signed)
FYI

## 2015-05-03 NOTE — Telephone Encounter (Signed)
Completed form mailed to pt per pt's request. Copy sent for scanning. JG//CMA

## 2015-05-17 ENCOUNTER — Encounter: Payer: Self-pay | Admitting: Internal Medicine

## 2015-06-12 ENCOUNTER — Encounter: Payer: Self-pay | Admitting: Nurse Practitioner

## 2015-06-12 NOTE — Progress Notes (Signed)
Electrophysiology Office Note Date: 06/13/2015  ID:  [name], DOB 1921-04-30, MRN [id number]  PCP: [name] Ora, MD Primary Cardiologist: [name] Electrophysiologist: [name]  CC: Pacemaker follow-up  [name] is a 81 y.o. male seen today for Dr [name].  His pacemaker has been nearing ERI and he presents today to discuss pacemaker generator change.  Since last being seen in our clinic, the patient reports doing relatively well.  He has had increased fatigue.  He denies chest pain, palpitations, dyspnea, PND, orthopnea, nausea, vomiting, dizziness, syncope, weight gain, or early satiety.  Last echo 03/2013 demonstrated EF 50-55%, moderate LVH, mild MR, LA 48.  Device History: MDT dual chamber PPM implanted 2008 for sick sinus syndrome   Past Medical History  Diagnosis Date  . A-fib     paroxysmal, s/p TEE-DCCV 03/2013;  was on coumadin , pt desired to stop it , no longer a candidate per cards (hemorrhoidal bleeding; falls)  . CAD (coronary artery disease)     a. MI angioplasty 93, b. s/p CABG 01/2006;   c.  NSTEMI 06/2010 - [facility]:  L-LAD ok, S-Dx ok, S-OM ok, S-PDA ok, small OM sub total occluded - Med Rx  . Chronic diastolic heart failure   . Hyperlipidemia   . Hypertension   . Spinal stenosis      has seen Dr [name] before, s/p local injection which helped x a while   . GERD (gastroesophageal reflux disease)   . RLS (restless legs syndrome)   . Insomnia   . History of BPH     TURP '96 at [facility] 96, urethoplast 97 ( History of bladder outlet obstruction, chronic incontinence)  . B12 deficiency anemia   . Myocardial infarction 01/1992; 06/2010  . Arthritis   . Hx of cardiovascular stress test     a.  Myoview (10/2005):  EF 62%, mild apical ischemia  . Hx of echocardiogram     a.  Echo (03/23/13):  mod LVH, EF 50-55%, no WMA, mild MR, mod LAE, mild RVE, mod RAE, mod TR   Past Surgical History  Procedure Laterality Date  . Coronary artery bypass graft      "CABG X4"  (05/14/2013)  . Cystoscopy with urethral dilatation  1997    "fixed strictures from TURP" (05/14/2013)  . Inguinal hernia repair Right   . Carpal tunnel release Bilateral ~ 2005    Dr [name]  . Pacemaker insertion  12/2006    MDT dual chamber PPM implanted for SSS  . Tee without cardioversion N/A 03/27/2013    Procedure: TRANSESOPHAGEAL ECHOCARDIOGRAM (TEE);  Surgeon: [name] Bunting, MD;  Location: [facility] ENDOSCOPY;  Service: Cardiovascular;  Laterality: N/A;  . Cardioversion N/A 03/27/2013    Procedure: CARDIOVERSION;  Surgeon: [name] Bunting, MD;  Location: [facility] ENDOSCOPY;  Service: Cardiovascular;  Laterality: N/A;  . Total knee arthroplasty Bilateral     right 2005, Left 2006.  Dr  [name]  . Coronary angioplasty      "Dr. Riley [name]"  . Cataract extraction w/ intraocular lens  implant, bilateral Bilateral 2012  . Transurethral resection of prostate  1996    Current Outpatient Prescriptions  Medication Sig Dispense Refill  . acetaminophen (TYLENOL) 500 MG tablet Take 500 mg by mouth every 8 (eight) hours as needed for moderate pain (pain).     . B Complex-C (B-COMPLEX WITH VITAMIN C) tablet Take 1 tablet by mouth daily.    . beta carotene w/minerals (OCUVITE) tablet Take 1 tablet by mouth  daily.    . carvedilol (COREG) 6.25 MG tablet Take 1 tablet (6.25 mg total) by mouth 2 (two) times daily with a meal. 60 tablet 6  . potassium chloride (K-DUR) 10 MEQ tablet Take 10 mEq by mouth once. Take one tablet by mouth daily alternating with two tablets by mouth daily.    . pramipexole (MIRAPEX) 0.125 MG tablet Take 1 to 2 tablets by mouth at bedtime as needed for restless legs. (Patient taking differently: Take 0.125 mg by mouth. Take 1 to 2 tablets by mouth at bedtime once or twice  as needed for restless legs.) 60 tablet 6  . torsemide (DEMADEX) 20 MG tablet Take 0.5 tablets (10 mg total) by mouth daily. 30 tablet 6   No current facility-administered medications for this visit.    Allergies:    Amlodipine; Atorvastatin; and Zinc   Social History: History   Social History  . Marital Status: Married    Spouse Name: N/A  . Number of Children: 2  . Years of Education: N/A   Occupational History  . retired     Social History Main Topics  . Smoking status: Former Smoker -- 2.00 packs/day for 15 years    Types: Cigarettes    Quit date: 02/01/1959  . Smokeless tobacco: Never Used  . Alcohol Use: No  . Drug Use: No  . Sexual Activity: Not Currently   Other Topics Concern  . Not on file   Social History Narrative   Lives at home  w/ wife, wife drives    2 children, son lives  in town [place])    Family History: Family History  Problem Relation Age of Onset  . Heart disease Neg Hx   . Diabetes Neg Hx   . Stroke Mother   . Cancer Father      Review of Systems: All other systems reviewed and are otherwise negative except as noted above.   Physical Exam: VS:  BP 120/62 mmHg  Pulse 65  Ht 5\' 7"  (1.702 m)  Wt 211 lb (95.709 kg)  BMI 33.04 kg/m2  SpO2 97% , BMI Body mass index is 33.04 kg/(m^2).  GEN- The patient is elderly appearing, alert and oriented x 3 today.   HEENT: normocephalic, atraumatic; sclera clear, conjunctiva pink; hearing intact; oropharynx clear; neck supple Lungs- Clear to ausculation bilaterally, normal work of breathing.  No wheezes, rales, rhonchi Heart- Regular rate and rhythm (paced) GI- soft, non-tender, non-distended, bowel sounds present, no hepatosplenomegaly Extremities- no clubbing, cyanosis, + BLE edema MS- no significant deformity or atrophy Skin- warm and dry, no rash or lesion; PPM pocket well healed Psych- euthymic mood, full affect Neuro- strength and sensation are intact  PPM Interrogation- reviewed in detail today,  See PACEART report  EKG:  EKG is not ordered today  Recent Labs: 07/15/2014: TSH 0.81 01/02/2015: Hemoglobin 11.6*; Platelets 192 04/12/2015: BUN 17; Creatinine, Ser 0.99; Potassium 4.6; Sodium 138    Wt Readings from Last 3 Encounters:  06/13/15 211 lb (95.709 kg)  04/25/15 212 lb 12.8 oz (96.525 kg)  04/12/15 205 lb 6 oz (93.157 kg)     Other studies Reviewed: Additional studies/ records that were reviewed today include: Dr [name] and Klein's office notes  Assessment and Plan:  1.  Sick sinus syndrome/tachy-brady Pacemaker at [facility]  Risks, benefits to generator change were discussed with the patient who wishes to proceed. Will schedule with Dr [name] at the next available time.  2.  Paroxysmal atrial fibrillation CHADS2VASC is  at least 4 Not felt to be an anticoagulation candidate 2/2 hemorrhoidal bleeding and frequent falls  3.  CAD No recent ischemic symptoms  4.  Chronic diastolic heart failure Slightly volume overloaded on exam Diuretics limited by renal function - pt will benefit from AV synchrony with gen change Dietary compliance encouraged today  5.  HTN Stable No change required today   Current medicines are reviewed at length with the patient today.   The patient does not have concerns regarding his medicines.  The following changes were made today:  none  Labs/ tests ordered today include: pre-procedure labs   Disposition:   Follow up with Dr [name] HusbandsKlein after generator change   Signed, Gypsy BalsamAmber Velmer Broadfoot, NP 06/13/2015 11:54 AM  [facility] [address] [place] [address] [phone] (office) [phone] (fax)

## 2015-06-13 ENCOUNTER — Encounter: Payer: Self-pay | Admitting: Nurse Practitioner

## 2015-06-13 ENCOUNTER — Ambulatory Visit (INDEPENDENT_AMBULATORY_CARE_PROVIDER_SITE_OTHER): Payer: Medicare Other | Admitting: Nurse Practitioner

## 2015-06-13 VITALS — BP 120/62 | HR 65 | Ht 67.0 in | Wt 211.0 lb

## 2015-06-13 DIAGNOSIS — I5032 Chronic diastolic (congestive) heart failure: Secondary | ICD-10-CM

## 2015-06-13 DIAGNOSIS — I495 Sick sinus syndrome: Secondary | ICD-10-CM

## 2015-06-13 DIAGNOSIS — Z01812 Encounter for preprocedural laboratory examination: Secondary | ICD-10-CM | POA: Diagnosis not present

## 2015-06-13 DIAGNOSIS — I10 Essential (primary) hypertension: Secondary | ICD-10-CM | POA: Diagnosis not present

## 2015-06-13 LAB — CBC WITH DIFFERENTIAL/PLATELET
Basophils Absolute: 0 10*3/uL (ref 0.0–0.1)
Basophils Relative: 0.5 % (ref 0.0–3.0)
Eosinophils Absolute: 0.2 10*3/uL (ref 0.0–0.7)
Eosinophils Relative: 3.6 % (ref 0.0–5.0)
HCT: 34.8 % — ABNORMAL LOW (ref 39.0–52.0)
Hemoglobin: 11.6 g/dL — ABNORMAL LOW (ref 13.0–17.0)
LYMPHS PCT: 19.4 % (ref 12.0–46.0)
Lymphs Abs: 1.3 10*3/uL (ref 0.7–4.0)
MCHC: 33.3 g/dL (ref 30.0–36.0)
MCV: 91.2 fl (ref 78.0–100.0)
MONO ABS: 0.6 10*3/uL (ref 0.1–1.0)
Monocytes Relative: 8.3 % (ref 3.0–12.0)
NEUTROS ABS: 4.5 10*3/uL (ref 1.4–7.7)
Neutrophils Relative %: 68.2 % (ref 43.0–77.0)
Platelets: 160 10*3/uL (ref 150.0–400.0)
RBC: 3.82 Mil/uL — AB (ref 4.22–5.81)
RDW: 14.3 % (ref 11.5–15.5)
WBC: 6.6 10*3/uL (ref 4.0–10.5)

## 2015-06-13 LAB — BASIC METABOLIC PANEL
BUN: 16 mg/dL (ref 6–23)
CALCIUM: 9 mg/dL (ref 8.4–10.5)
CO2: 29 mEq/L (ref 19–32)
Chloride: 105 mEq/L (ref 96–112)
Creatinine, Ser: 1.21 mg/dL (ref 0.40–1.50)
GFR: 59.67 mL/min — ABNORMAL LOW (ref >60.00)
GLUCOSE: 92 mg/dL (ref 70–99)
Potassium: 4.6 mEq/L (ref 3.5–5.1)
Sodium: 139 mEq/L (ref 135–145)

## 2015-06-13 LAB — CUP PACEART INCLINIC DEVICE CHECK: MDC IDC SESS DTM: 20160711120356

## 2015-06-13 LAB — PROTIME-INR
INR: 1 ratio (ref 0.8–1.0)
PROTHROMBIN TIME: 11.6 s (ref 9.6–13.1)

## 2015-06-13 NOTE — Patient Instructions (Signed)
Medication Instructions:  Your physician recommends that you continue on your current medications as directed. Please refer to the Current Medication list given to you today.  Labwork: Your physician recommends that you have lab work today BMET, CBC, and INR/PT  Testing/Procedures: Your physician has recommended that you have a pacemaker generator change.   Follow-Up: Your physician recommends that you schedule a follow-up appointment as needed after procedure.   Any Other Special Instructions Will Be Listed Below (If Applicable).

## 2015-06-20 ENCOUNTER — Encounter (HOSPITAL_COMMUNITY): Payer: Self-pay | Admitting: Pharmacy Technician

## 2015-06-23 ENCOUNTER — Telehealth: Payer: Self-pay | Admitting: Internal Medicine

## 2015-06-23 NOTE — Telephone Encounter (Signed)
Informed ok to take morning medications with a small amount of water. She verbalized understanding and agreeable to plan.

## 2015-06-23 NOTE — Telephone Encounter (Signed)
New message     Pt is having procedure in the morning. Pt wife is wanting to know if pt can take medications in the morning Please call to discuss

## 2015-06-23 NOTE — Telephone Encounter (Signed)
Follow Up   Wife is following up on call back from earlier. Please call.

## 2015-06-24 ENCOUNTER — Ambulatory Visit (HOSPITAL_COMMUNITY)
Admission: RE | Admit: 2015-06-24 | Discharge: 2015-06-24 | Disposition: A | Discharge status: Home / Self Care | Payer: Medicare Other | Source: Ambulatory Visit | Attending: Internal Medicine | Admitting: Internal Medicine

## 2015-06-24 ENCOUNTER — Encounter (HOSPITAL_COMMUNITY)
Admission: RE | Disposition: A | Discharge status: Home / Self Care | Payer: Self-pay | Source: Ambulatory Visit | Attending: Internal Medicine

## 2015-06-24 DIAGNOSIS — E785 Hyperlipidemia, unspecified: Secondary | ICD-10-CM | POA: Insufficient documentation

## 2015-06-24 DIAGNOSIS — I480 Paroxysmal atrial fibrillation: Secondary | ICD-10-CM | POA: Insufficient documentation

## 2015-06-24 DIAGNOSIS — I10 Essential (primary) hypertension: Secondary | ICD-10-CM | POA: Diagnosis not present

## 2015-06-24 DIAGNOSIS — I2510 Atherosclerotic heart disease of native coronary artery without angina pectoris: Secondary | ICD-10-CM | POA: Insufficient documentation

## 2015-06-24 DIAGNOSIS — G2581 Restless legs syndrome: Secondary | ICD-10-CM | POA: Insufficient documentation

## 2015-06-24 DIAGNOSIS — I495 Sick sinus syndrome: Secondary | ICD-10-CM | POA: Diagnosis not present

## 2015-06-24 DIAGNOSIS — Z79899 Other long term (current) drug therapy: Secondary | ICD-10-CM | POA: Diagnosis not present

## 2015-06-24 DIAGNOSIS — K219 Gastro-esophageal reflux disease without esophagitis: Secondary | ICD-10-CM | POA: Insufficient documentation

## 2015-06-24 DIAGNOSIS — I252 Old myocardial infarction: Secondary | ICD-10-CM | POA: Insufficient documentation

## 2015-06-24 DIAGNOSIS — D519 Vitamin B12 deficiency anemia, unspecified: Secondary | ICD-10-CM | POA: Diagnosis not present

## 2015-06-24 DIAGNOSIS — I5032 Chronic diastolic (congestive) heart failure: Secondary | ICD-10-CM | POA: Diagnosis not present

## 2015-06-24 DIAGNOSIS — Z538 Procedure and treatment not carried out for other reasons: Secondary | ICD-10-CM | POA: Insufficient documentation

## 2015-06-24 DIAGNOSIS — Z45010 Encounter for checking and testing of cardiac pacemaker pulse generator [battery]: Secondary | ICD-10-CM | POA: Diagnosis not present

## 2015-06-24 DIAGNOSIS — Z951 Presence of aortocoronary bypass graft: Secondary | ICD-10-CM | POA: Diagnosis not present

## 2015-06-24 DIAGNOSIS — Z87891 Personal history of nicotine dependence: Secondary | ICD-10-CM | POA: Diagnosis not present

## 2015-06-24 DIAGNOSIS — R0602 Shortness of breath: Secondary | ICD-10-CM

## 2015-06-24 HISTORY — PX: EP IMPLANTABLE DEVICE: SHX172B

## 2015-06-24 LAB — SURGICAL PCR SCREEN
MRSA, PCR: NEGATIVE
Staphylococcus aureus: NEGATIVE

## 2015-06-24 SURGERY — PPM/BIV PPM GENERATOR CHANGEOUT
Anesthesia: LOCAL

## 2015-06-24 MED ORDER — PRAMIPEXOLE DIHYDROCHLORIDE 0.125 MG PO TABS: 0.1250 mg | ORAL_TABLET | Freq: Three times a day (TID) | ORAL | Status: AC

## 2015-06-24 MED ORDER — FUROSEMIDE 10 MG/ML IJ SOLN
INTRAMUSCULAR | Status: AC
  Filled 2015-06-24: qty 8

## 2015-06-24 MED ORDER — CHLORHEXIDINE GLUCONATE 4 % EX LIQD
60.0000 mL | CUTANEOUS | Status: DC
  Filled 2015-06-24 (×2): qty 60

## 2015-06-24 MED ORDER — MUPIROCIN 2 % EX OINT
TOPICAL_OINTMENT | Freq: Two times a day (BID) | CUTANEOUS | Status: DC
  Administered 2015-06-24: 11:00:00 via NASAL
  Filled 2015-06-24: qty 22

## 2015-06-24 MED ORDER — FENTANYL CITRATE (PF) 100 MCG/2ML IJ SOLN
INTRAMUSCULAR | Status: AC
  Filled 2015-06-24: qty 2

## 2015-06-24 MED ORDER — CEFAZOLIN SODIUM-DEXTROSE 2-3 GM-% IV SOLR
2.0000 g | INTRAVENOUS | Status: DC
  Filled 2015-06-24: qty 50

## 2015-06-24 MED ORDER — SODIUM CHLORIDE 0.9 % IR SOLN
Status: AC
  Filled 2015-06-24: qty 2

## 2015-06-24 MED ORDER — DIPHENHYDRAMINE HCL 50 MG/ML IJ SOLN
25.0000 mg | Freq: Once | INTRAMUSCULAR | Status: AC
  Administered 2015-06-24: 12.5 mg via INTRAVENOUS

## 2015-06-24 MED ORDER — FUROSEMIDE 10 MG/ML IJ SOLN
INTRAMUSCULAR | Status: DC | PRN
  Administered 2015-06-24: 80 mg via INTRAVENOUS

## 2015-06-24 MED ORDER — CEFAZOLIN SODIUM-DEXTROSE 2-3 GM-% IV SOLR
INTRAVENOUS | Status: DC | PRN
  Administered 2015-06-24: 2 g via INTRAVENOUS

## 2015-06-24 MED ORDER — LIDOCAINE HCL (PF) 1 % IJ SOLN
INTRAMUSCULAR | Status: AC
  Filled 2015-06-24: qty 60

## 2015-06-24 MED ORDER — MIDAZOLAM HCL 5 MG/5ML IJ SOLN
INTRAMUSCULAR | Status: AC
  Filled 2015-06-24: qty 5

## 2015-06-24 MED ORDER — MUPIROCIN 2 % EX OINT
TOPICAL_OINTMENT | CUTANEOUS | Status: AC
Start: 2015-06-24 — End: 2015-06-24
  Filled 2015-06-24: qty 22

## 2015-06-24 MED ORDER — CEFAZOLIN SODIUM-DEXTROSE 2-3 GM-% IV SOLR
INTRAVENOUS | Status: AC
  Filled 2015-06-24: qty 50

## 2015-06-24 MED ORDER — MIDAZOLAM HCL 5 MG/5ML IJ SOLN
INTRAMUSCULAR | Status: DC | PRN
  Administered 2015-06-24: 1 mg via INTRAVENOUS

## 2015-06-24 MED ORDER — SODIUM CHLORIDE 0.9 % IR SOLN
80.0000 mg | Status: DC
  Filled 2015-06-24: qty 2

## 2015-06-24 MED ORDER — SODIUM CHLORIDE 0.9 % IV SOLN
INTRAVENOUS | Status: DC
  Administered 2015-06-24: 11:00:00 via INTRAVENOUS

## 2015-06-24 MED ORDER — PRAMIPEXOLE DIHYDROCHLORIDE 0.125 MG PO TABS
0.1250 mg | ORAL_TABLET | ORAL | Status: DC
  Filled 2015-06-24: qty 1

## 2015-06-24 MED ORDER — PRAMIPEXOLE DIHYDROCHLORIDE 0.125 MG PO TABS: 0.1250 mg | ORAL_TABLET | Freq: Three times a day (TID) | ORAL | Status: DC

## 2015-06-24 SURGICAL SUPPLY — 3 items
CABLE SURGICAL S-101-97-12 (CABLE) ×1 IMPLANT
PAD DEFIB LIFELINK (PAD) ×1 IMPLANT
TRAY PACEMAKER INSERTION (CUSTOM PROCEDURE TRAY) IMPLANT

## 2015-06-24 NOTE — H&P (View-Only) (Signed)
Electrophysiology Office Note Date: 06/13/2015  ID:  [name], DOB 1921-04-30, MRN [id number]  PCP: Willow Ora, MD Primary Cardiologist: Excell Seltzer Electrophysiologist: Graciela Husbands  CC: Pacemaker follow-up  [name] is a 81 y.o. male seen today for Dr Graciela Husbands.  His pacemaker has been nearing ERI and he presents today to discuss pacemaker generator change.  Since last being seen in our clinic, the patient reports doing relatively well.  He has had increased fatigue.  He denies chest pain, palpitations, dyspnea, PND, orthopnea, nausea, vomiting, dizziness, syncope, weight gain, or early satiety.  Last echo 03/2013 demonstrated EF 50-55%, moderate LVH, mild MR, LA 48.  Device History: MDT dual chamber PPM implanted 2008 for sick sinus syndrome   Past Medical History  Diagnosis Date  . A-fib     paroxysmal, s/p TEE-DCCV 03/2013;  was on coumadin , pt desired to stop it , no longer a candidate per cards (hemorrhoidal bleeding; falls)  . CAD (coronary artery disease)     a. MI angioplasty 93, b. s/p CABG 01/2006;   c.  NSTEMI 06/2010 - [facility]:  L-LAD ok, S-Dx ok, S-OM ok, S-PDA ok, small OM sub total occluded - Med Rx  . Chronic diastolic heart failure   . Hyperlipidemia   . Hypertension   . Spinal stenosis      has seen Dr Ethelene Hal before, s/p local injection which helped x a while   . GERD (gastroesophageal reflux disease)   . RLS (restless legs syndrome)   . Insomnia   . History of BPH     TURP '96 at [facility] 96, urethoplast 97 ( History of bladder outlet obstruction, chronic incontinence)  . B12 deficiency anemia   . Myocardial infarction 01/1992; 06/2010  . Arthritis   . Hx of cardiovascular stress test     a.  Myoview (10/2005):  EF 62%, mild apical ischemia  . Hx of echocardiogram     a.  Echo (03/23/13):  mod LVH, EF 50-55%, no WMA, mild MR, mod LAE, mild RVE, mod RAE, mod TR   Past Surgical History  Procedure Laterality Date  . Coronary artery bypass graft      "CABG X4"  (05/14/2013)  . Cystoscopy with urethral dilatation  1997    "fixed strictures from TURP" (05/14/2013)  . Inguinal hernia repair Right   . Carpal tunnel release Bilateral ~ 2005    Dr Teressa Senter  . Pacemaker insertion  12/2006    MDT dual chamber PPM implanted for SSS  . Tee without cardioversion N/A 03/27/2013    Procedure: TRANSESOPHAGEAL ECHOCARDIOGRAM (TEE);  Surgeon: Lewayne Bunting, MD;  Location: [facility] ENDOSCOPY;  Service: Cardiovascular;  Laterality: N/A;  . Cardioversion N/A 03/27/2013    Procedure: CARDIOVERSION;  Surgeon: Lewayne Bunting, MD;  Location: [facility] ENDOSCOPY;  Service: Cardiovascular;  Laterality: N/A;  . Total knee arthroplasty Bilateral     right 2005, Left 2006.  Dr  Despina Hick  . Coronary angioplasty      "Dr. Riley Kill"  . Cataract extraction w/ intraocular lens  implant, bilateral Bilateral 2012  . Transurethral resection of prostate  1996    Current Outpatient Prescriptions  Medication Sig Dispense Refill  . acetaminophen (TYLENOL) 500 MG tablet Take 500 mg by mouth every 8 (eight) hours as needed for moderate pain (pain).     . B Complex-C (B-COMPLEX WITH VITAMIN C) tablet Take 1 tablet by mouth daily.    . beta carotene w/minerals (OCUVITE) tablet Take 1 tablet by mouth  daily.    . carvedilol (COREG) 6.25 MG tablet Take 1 tablet (6.25 mg total) by mouth 2 (two) times daily with a meal. 60 tablet 6  . potassium chloride (K-DUR) 10 MEQ tablet Take 10 mEq by mouth once. Take one tablet by mouth daily alternating with two tablets by mouth daily.    . pramipexole (MIRAPEX) 0.125 MG tablet Take 1 to 2 tablets by mouth at bedtime as needed for restless legs. (Patient taking differently: Take 0.125 mg by mouth. Take 1 to 2 tablets by mouth at bedtime once or twice  as needed for restless legs.) 60 tablet 6  . torsemide (DEMADEX) 20 MG tablet Take 0.5 tablets (10 mg total) by mouth daily. 30 tablet 6   No current facility-administered medications for this visit.    Allergies:    Amlodipine; Atorvastatin; and Zinc   Social History: History   Social History  . Marital Status: Married    Spouse Name: N/A  . Number of Children: 2  . Years of Education: N/A   Occupational History  . retired     Social History Main Topics  . Smoking status: Former Smoker -- 2.00 packs/day for 15 years    Types: Cigarettes    Quit date: 02/01/1959  . Smokeless tobacco: Never Used  . Alcohol Use: No  . Drug Use: No  . Sexual Activity: Not Currently   Other Topics Concern  . Not on file   Social History Narrative   Lives at home  w/ wife, wife drives    2 children, son lives  in town [place])    Family History: Family History  Problem Relation Age of Onset  . Heart disease Neg Hx   . Diabetes Neg Hx   . Stroke Mother   . Cancer Father      Review of Systems: All other systems reviewed and are otherwise negative except as noted above.   Physical Exam: VS:  BP 120/62 mmHg  Pulse 65  Ht 5\' 7"  (1.702 m)  Wt 211 lb (95.709 kg)  BMI 33.04 kg/m2  SpO2 97% , BMI Body mass index is 33.04 kg/(m^2).  GEN- The patient is elderly appearing, alert and oriented x 3 today.   HEENT: normocephalic, atraumatic; sclera clear, conjunctiva pink; hearing intact; oropharynx clear; neck supple Lungs- Clear to ausculation bilaterally, normal work of breathing.  No wheezes, rales, rhonchi Heart- Regular rate and rhythm (paced) GI- soft, non-tender, non-distended, bowel sounds present, no hepatosplenomegaly Extremities- no clubbing, cyanosis, + BLE edema MS- no significant deformity or atrophy Skin- warm and dry, no rash or lesion; PPM pocket well healed Psych- euthymic mood, full affect Neuro- strength and sensation are intact  PPM Interrogation- reviewed in detail today,  See PACEART report  EKG:  EKG is not ordered today  Recent Labs: 07/15/2014: TSH 0.81 01/02/2015: Hemoglobin 11.6*; Platelets 192 04/12/2015: BUN 17; Creatinine, Ser 0.99; Potassium 4.6; Sodium 138    Wt Readings from Last 3 Encounters:  06/13/15 211 lb (95.709 kg)  04/25/15 212 lb 12.8 oz (96.525 kg)  04/12/15 205 lb 6 oz (93.157 kg)     Other studies Reviewed: Additional studies/ records that were reviewed today include: Dr Excell Seltzer and Klein's office notes  Assessment and Plan:  1.  Sick sinus syndrome/tachy-brady Pacemaker at [facility]  Risks, benefits to generator change were discussed with the patient who wishes to proceed. Will schedule with Dr Graciela Husbands at the next available time.  2.  Paroxysmal atrial fibrillation CHADS2VASC is  at least 4 Not felt to be an anticoagulation candidate 2/2 hemorrhoidal bleeding and frequent falls  3.  CAD No recent ischemic symptoms  4.  Chronic diastolic heart failure Slightly volume overloaded on exam Diuretics limited by renal function - pt will benefit from AV synchrony with gen change Dietary compliance encouraged today  5.  HTN Stable No change required today   Current medicines are reviewed at length with the patient today.   The patient does not have concerns regarding his medicines.  The following changes were made today:  none  Labs/ tests ordered today include: pre-procedure labs   Disposition:   Follow up with Dr Graciela HusbandsKlein after generator change   Signed, Gypsy BalsamAmber Seiler, NP 06/13/2015 11:54 AM  [facility] [address] [place] [address] [phone] (office) [phone] (fax)

## 2015-06-24 NOTE — Interval H&P Note (Signed)
History and Physical Interval Note:  06/24/2015 12:49 PM  [name]  has presented today for surgery, with the diagnosis of ppm eol  The various methods of treatment have been discussed with the patient and family. After consideration of risks, benefits and other options for treatment, the patient has consented to  Procedure(s):  PPM Generator Changeout (N/A) as a surgical intervention .  The patient's history has been reviewed, patient examined, no change in status, stable for surgery.  I have reviewed the patient's chart and labs.  Questions were answered to the patient's satisfaction.     symtptoms of heart failure in part related to V pacing at 65  Will anticipate diureses afterwards augmented by meds and AV synchrony  We have reviewed the benefits and risks of generator replacement.  These include but are not limited to lead fracture and infection.  The patient understands, agrees and is willing to proceed.     [name]

## 2015-06-24 NOTE — Progress Notes (Addendum)
Pt unable to lie flat because of restless legs and pain and aggravated by SOB all of which were improved by coming off the table,  He was unable to stay flat.    He was tachypneic with RR >30 and paradoxical breathing but lungs were clear.  Wt stable over recent months, but edema is prominent and different from what was described most recently by Dr [facility] notwithstanding the weight   Will begin mirapex and give diuresis  And discuss with family re rescheduling for Monday  The device was reprogrammed to hysterisis that eliminated V pacing    2:26--3:06 and now going to talk to family  

## 2015-06-27 ENCOUNTER — Encounter (HOSPITAL_COMMUNITY)
Admission: RE | Disposition: A | Discharge status: Home / Self Care | Payer: Medicare Other | Source: Ambulatory Visit | Attending: Internal Medicine

## 2015-06-27 ENCOUNTER — Encounter (HOSPITAL_COMMUNITY): Payer: Self-pay | Admitting: Internal Medicine

## 2015-06-27 ENCOUNTER — Ambulatory Visit (HOSPITAL_COMMUNITY)
Admission: RE | Admit: 2015-06-27 | Discharge: 2015-06-27 | Disposition: A | Discharge status: Home / Self Care | Payer: Medicare Other | Source: Ambulatory Visit | Attending: Internal Medicine | Admitting: Internal Medicine

## 2015-06-27 DIAGNOSIS — Z950 Presence of cardiac pacemaker: Secondary | ICD-10-CM | POA: Diagnosis present

## 2015-06-27 DIAGNOSIS — I498 Other specified cardiac arrhythmias: Secondary | ICD-10-CM | POA: Diagnosis present

## 2015-06-27 DIAGNOSIS — R55 Syncope and collapse: Secondary | ICD-10-CM | POA: Diagnosis present

## 2015-06-27 DIAGNOSIS — G2581 Restless legs syndrome: Secondary | ICD-10-CM | POA: Diagnosis not present

## 2015-06-27 DIAGNOSIS — Z45010 Encounter for checking and testing of cardiac pacemaker pulse generator [battery]: Secondary | ICD-10-CM | POA: Insufficient documentation

## 2015-06-27 DIAGNOSIS — I5032 Chronic diastolic (congestive) heart failure: Secondary | ICD-10-CM | POA: Diagnosis present

## 2015-06-27 DIAGNOSIS — I495 Sick sinus syndrome: Secondary | ICD-10-CM | POA: Diagnosis not present

## 2015-06-27 HISTORY — PX: EP IMPLANTABLE DEVICE: SHX172B

## 2015-06-27 SURGERY — PPM/BIV PPM GENERATOR CHANGEOUT
Anesthesia: LOCAL

## 2015-06-27 MED ORDER — SODIUM CHLORIDE 0.9 % IR SOLN
Status: AC
  Filled 2015-06-27: qty 2

## 2015-06-27 MED ORDER — CEFAZOLIN SODIUM-DEXTROSE 2-3 GM-% IV SOLR
INTRAVENOUS | Status: DC | PRN
  Administered 2015-06-27: 2 g via INTRAVENOUS

## 2015-06-27 MED ORDER — MIDAZOLAM HCL 5 MG/5ML IJ SOLN
INTRAMUSCULAR | Status: AC
  Filled 2015-06-27: qty 5

## 2015-06-27 MED ORDER — SODIUM CHLORIDE 0.9 % IV SOLN
INTRAVENOUS | Status: DC
  Administered 2015-06-27: 07:00:00 via INTRAVENOUS

## 2015-06-27 MED ORDER — CEFAZOLIN SODIUM-DEXTROSE 2-3 GM-% IV SOLR: 2.0000 g | INTRAVENOUS | Status: DC

## 2015-06-27 MED ORDER — LIDOCAINE HCL (PF) 1 % IJ SOLN
INTRAMUSCULAR | Status: AC
  Filled 2015-06-27: qty 60

## 2015-06-27 MED ORDER — FENTANYL CITRATE (PF) 100 MCG/2ML IJ SOLN
INTRAMUSCULAR | Status: AC
  Filled 2015-06-27: qty 2

## 2015-06-27 MED ORDER — ACETAMINOPHEN 325 MG PO TABS
325.0000 mg | ORAL_TABLET | ORAL | Status: DC | PRN
  Filled 2015-06-27: qty 2

## 2015-06-27 MED ORDER — LIDOCAINE HCL (PF) 1 % IJ SOLN
INTRAMUSCULAR | Status: DC | PRN
  Administered 2015-06-27: 31 mL via INTRADERMAL

## 2015-06-27 MED ORDER — CEFAZOLIN SODIUM-DEXTROSE 2-3 GM-% IV SOLR
INTRAVENOUS | Status: AC
  Filled 2015-06-27: qty 50

## 2015-06-27 MED ORDER — ONDANSETRON HCL 4 MG/2ML IJ SOLN: 4.0000 mg | Freq: Four times a day (QID) | INTRAMUSCULAR | Status: DC | PRN

## 2015-06-27 MED ORDER — CHLORHEXIDINE GLUCONATE 4 % EX LIQD: 60.0000 mL | Freq: Once | CUTANEOUS | Status: DC

## 2015-06-27 MED ORDER — SODIUM CHLORIDE 0.9 % IV SOLN: INTRAVENOUS | Status: DC

## 2015-06-27 MED ORDER — SODIUM CHLORIDE 0.9 % IR SOLN: 80.0000 mg | Status: DC

## 2015-06-27 MED FILL — Gentamicin Sulfate Inj 40 MG/ML: INTRAMUSCULAR | Qty: 2 | Status: AC

## 2015-06-27 MED FILL — Midazolam HCl Inj 5 MG/5ML (Base Equivalent): INTRAMUSCULAR | Qty: 5 | Status: AC

## 2015-06-27 MED FILL — Sodium Chloride Irrigation Soln 0.9%: Qty: 500 | Status: AC

## 2015-06-27 SURGICAL SUPPLY — 5 items
CABLE SURGICAL S-101-97-12 (CABLE) ×1 IMPLANT
PACEMAKER ADAPTA DR ADDRL1 (Pacemaker) IMPLANT
PAD DEFIB LIFELINK (PAD) ×1 IMPLANT
PPM ADAPTA DR ADDRL1 (Pacemaker) ×2 IMPLANT
TRAY PACEMAKER INSERTION (CUSTOM PROCEDURE TRAY) ×1 IMPLANT

## 2015-06-27 NOTE — H&P (View-Only) (Signed)
Pt unable to lie flat because of restless legs and pain and aggravated by SOB all of which were improved by coming off the table,  He was unable to stay flat.    He was tachypneic with RR >30 and paradoxical breathing but lungs were clear.  Wt stable over recent months, but edema is prominent and different from what was described most recently by Dr [facility] notwithstanding the weight   Will begin mirapex and give diuresis  And discuss with family re rescheduling for Monday  The device was reprogrammed to hysterisis that eliminated V pacing    2:26--3:06 and now going to talk to family

## 2015-06-27 NOTE — Interval H&P Note (Signed)
History and Physical Interval Note:  06/27/2015 6:54 AM  [name]  has presented today for surgery, with the diagnosis of ERI  The various methods of treatment have been discussed with the patient and family. After consideration of risks, benefits and other options for treatment, the patient has consented to  Procedure(s): PPM Generator Changeout (N/A) as a surgical intervention .  The patient's history has been reviewed, patient examined, no change in status, stable for surgery.  I have reviewed the patient's chart and labs.  Questions were answered to the patient's satisfaction.     [name]  Much less dyspnea and restlessnes today for generator replacement

## 2015-06-27 NOTE — Discharge Instructions (Signed)
Pacemaker Battery Change A pacemaker battery usually lasts 4 to 12 years. Once or twice per year, you will be asked to visit your health care provider to have a full evaluation of your pacemaker. When a battery needs to be replaced, the entire pacemaker is replaced so that you can benefit from new circuitry and any new features that have been added to pacemakers. Most often, this procedure is very simple because the leads are already in place.  There are many things that affect how long a pacemaker battery will last, including:   The age of the pacemaker.   The number of leads (1, 2, or 3).   The pacemaker work load. If the pacemaker is helping the heart more often, the battery will not last as long as it would if the pacemaker did not need to help the heart.   Power (voltage) settings. LET [facility] CARE PROVIDER KNOW ABOUT:   Any allergies you have.  All medicines you are taking, including vitamins, herbs, eye drops, creams, and over-the-counter medicines. .  Pacemaker Battery Change, Care After Refer to this sheet in the next few weeks. These instructions provide you with information on caring for yourself after your procedure. Your health care provider may also give you more specific instructions. Your treatment has been planned according to current medical practices, but problems sometimes occur. Call your health care provider if you have any problems or questions after your procedure. WHAT TO EXPECT AFTER THE PROCEDURE After your procedure, it is typical to have the following sensations: Soreness at the pacemaker site. HOME CARE INSTRUCTIONS  Keep the incision clean and dry. Unless advised otherwise, you may shower beginning 48 hours after your procedure. For the first week after the replacement, avoid stretching motions that pull at the incision site, and avoid heavy exercise with the arm that is on the same side as the incision. Take medicines only as directed by your health  care provider. Keep all follow-up visits as directed by your health care provider. SEEK MEDICAL CARE IF:  You have pain at the incision site that is not relieved by over-the-counter or prescription medicine. There is drainage or pus from the incision site. There is swelling larger than a lime at the incision site. You develop red streaking that extends above or below the incision site. You feel brief, intermittent palpitations, light-headedness, or any symptoms that you feel might be related to your heart. SEEK IMMEDIATE MEDICAL CARE IF:  You experience chest pain that is different than the pain at the pacemaker site. You experience shortness of breath. You have palpitations or irregular heartbeat. You have light-headedness that does not go away quickly. You faint. You have pain that gets worse and is not relieved by medicine. Document Released: 09/09/2013 Document Revised: 04/05/2014 Document Reviewed: 09/09/2013 [facility] Patient Information 2015 [place], [place]. This information is not intended to replace advice given to you by your health care provider. Make sure you discuss any questions you have with your health care provider.    Previous problems you or members of your family have had with the use of anesthetics.   Any blood disorders you have.   Previous surgeries you have had, especially since your last pacemaker placement.   Medical conditions you have.   Possibility of pregnancy, if this applies.  Symptoms of chest pain, trouble breathing, palpitations, light-headedness, or feelings of an abnormal or irregular heartbeat. RISKS AND COMPLICATIONS  Generally, this is a safe procedure. However, as with any procedure, problems  can occur and include:   Bleeding.   Bruising of the skin around where the incision was made.   Pain at the incision site.   Pulling apart of the skin at the incision site.   Infection.   Allergic reaction to anesthetics or other  medicines used during the procedure.  People with diabetes may have a temporary increase in their blood sugar after any surgical procedure.  BEFORE THE PROCEDURE   Wash all of the skin around the area of the chest where the pacemaker is located.   Ask your health care provider for help with any medicine adjustments before the pacemaker is replaced.   Do not eat or drink anything after midnight on the night before the procedure or as directed by your health care provider.  Ask your health care provider if you can take a sip of water with any approved medicines the morning of the procedure. PROCEDURE   After giving medicine to numb the skin (local anesthetic), your health care provider will make a cut to reopen the pocket holding the pacemaker.   The old pacemaker will be disconnected from its leads.   The leads will be tested.   If needed, the leads will be replaced. If the leads are functioning properly, the new pacemaker may be connected to the existing leads.  A heart monitor and the pacemaker programmer will be used to make sure that the new pacemaker is working properly.  The incision site will then be closed. A dressing will be placed over the pacemaker site. The dressing will be removed 24-48 hours afterward. AFTER THE PROCEDURE   You will be taken to a recovery area after the new pacemaker implant is completed. Your vital signs such as blood pressure, heart rate, breathing, and oxygen levels will be monitored.  Your health care provider will tell you when you will need to next test your pacemaker or when to return to the office for follow-up for removal of stitches. Document Released: 02/27/2007 Document Revised: 04/05/2014 Document Reviewed: 06/03/2013 [facility] Patient Information 2015 [place], [place]. This information is not intended to replace advice given to you by your health care provider. Make sure you discuss any questions you have with your health care  provider.

## 2015-06-28 MED FILL — Sodium Chloride Irrigation Soln 0.9%: Qty: 500 | Status: AC

## 2015-06-28 MED FILL — Gentamicin Sulfate Inj 40 MG/ML: INTRAMUSCULAR | Qty: 2 | Status: AC

## 2015-07-07 ENCOUNTER — Ambulatory Visit (INDEPENDENT_AMBULATORY_CARE_PROVIDER_SITE_OTHER): Payer: Medicare Other | Admitting: *Deleted

## 2015-07-07 DIAGNOSIS — Z950 Presence of cardiac pacemaker: Secondary | ICD-10-CM | POA: Diagnosis not present

## 2015-07-07 DIAGNOSIS — I495 Sick sinus syndrome: Secondary | ICD-10-CM | POA: Diagnosis not present

## 2015-07-07 LAB — CUP PACEART INCLINIC DEVICE CHECK
Battery Impedance: 100 Ohm
Battery Remaining Longevity: 148 mo
Battery Voltage: 2.79 V
Brady Statistic AS VS Percent: 11 %
Date Time Interrogation Session: 20160804120416
Lead Channel Impedance Value: 450 Ohm
Lead Channel Pacing Threshold Amplitude: 0.5 V
Lead Channel Pacing Threshold Pulse Width: 0.4 ms
Lead Channel Pacing Threshold Pulse Width: 0.4 ms
Lead Channel Sensing Intrinsic Amplitude: 2 mV
Lead Channel Sensing Intrinsic Amplitude: 2.8 mV
Lead Channel Setting Sensing Sensitivity: 1.4 mV
MDC IDC MSMT LEADCHNL RA PACING THRESHOLD AMPLITUDE: 0.75 V
MDC IDC MSMT LEADCHNL RV IMPEDANCE VALUE: 427 Ohm
MDC IDC SET LEADCHNL RA PACING AMPLITUDE: 1.625
MDC IDC SET LEADCHNL RV PACING AMPLITUDE: 2 V
MDC IDC SET LEADCHNL RV PACING PULSEWIDTH: 0.4 ms
MDC IDC STAT BRADY AP VP PERCENT: 1 %
MDC IDC STAT BRADY AP VS PERCENT: 88 %
MDC IDC STAT BRADY AS VP PERCENT: 0 %

## 2015-07-07 NOTE — Progress Notes (Signed)
Changeout Wound check appointment. No steri-strips, dermabond used. Wound without redness or edema. Incision edges approximated, wound well healed. Normal device function. Thresholds, sensing, and impedances consistent with implant measurements. Device programmed at chronic output settings. Histogram distribution appropriate for patient and level of activity. 11 mode switches--- <0.1%, longest 74min10sec, max-A 246bpm. No high ventricular rates noted. Patient educated about wound care, arm mobility, lifting restrictions. Carelink 10/06/15 & ROV w/ SK in 31mo.

## 2015-07-11 ENCOUNTER — Encounter: Payer: Self-pay | Admitting: Internal Medicine

## 2015-08-03 ENCOUNTER — Other Ambulatory Visit: Payer: Self-pay | Admitting: Cardiovascular Disease

## 2015-08-11 ENCOUNTER — Other Ambulatory Visit: Payer: Self-pay

## 2015-08-16 ENCOUNTER — Other Ambulatory Visit: Payer: Self-pay | Admitting: Internal Medicine

## 2015-08-23 ENCOUNTER — Encounter: Payer: Self-pay | Admitting: Internal Medicine

## 2015-08-23 ENCOUNTER — Telehealth: Payer: Self-pay | Admitting: Internal Medicine

## 2015-08-23 NOTE — Telephone Encounter (Signed)
I spoke with the pt's wife and the pt went to the hospital on 06/24/15 for pacemaker generator change but due to the hospital schedule the pt was sent home because they did not have time to get to the pt's procedure.  The pt did pay his hospital copay that day.  The pt was rescheduled to come into the hospital on 06/27/15 for generator change and the procedure was performed that day. The pt did not pay a copay on 06/27/15.  The pt's wife called because now the hospital is sending them a bill to pay the copay for 06/27/15.  She is wondering why the pt's copay from 06/24/15 was not applied to 06/27/15. She did contact the billing department at the hospital and they told her that Dr Graciela Husbands needs to document this information in the pt's chart because it looks like the pt had a procedure performed on 06/24/15 and 06/27/15.  I will have to discuss this pt with the billing department.

## 2015-08-23 NOTE — Telephone Encounter (Signed)
New message      Talk to the nurse about an error in billing for his 06-27-15 pacemaker.  Wife said billing dept told her to call Dr Graciela Husbands.  Pt was supposed to have a pacemaker put in on 06-24-15 but acutally got it on 06-27-15.  They are being billed for 06-24-15 because of Dr Odessa Fleming notes----according to the billing dept.  Please call

## 2015-08-24 NOTE — Telephone Encounter (Signed)
Per Dr. Graciela Husbands, he has sent a copy of this to [facility] in the cath lab.

## 2015-08-24 NOTE — Telephone Encounter (Signed)
I spoke with Pam in our Billing department and she reviewed the pt's billing from the hospital system. She said the pt and insurance were both charged for dates of service on 06/24/15 and 06/27/15.  The pt's procedure was not performed on 06/24/15 due to OR scheduling and the pt had to come back on 06/27/15. Pam did not know who to contact at the hospital in regards to this information.  She felt like the MD needs to dictate a letter for the pt to send with the bill received so that the hospital can correct these errors.  I will forward this message to Dr Graciela Husbands and his nurse Herbert Seta RN to follow-up with the pt's wife.

## 2015-09-08 ENCOUNTER — Other Ambulatory Visit: Payer: Self-pay

## 2015-09-12 ENCOUNTER — Encounter: Payer: Medicare Other | Admitting: Internal Medicine

## 2015-09-12 ENCOUNTER — Telehealth: Payer: Self-pay | Admitting: Internal Medicine

## 2015-09-15 NOTE — Telephone Encounter (Signed)
Have you heard any update from [facility] this?

## 2015-09-18 NOTE — Telephone Encounter (Signed)
No we can sendd it again   [facility]  Thanks    [place]

## 2015-09-19 NOTE — Telephone Encounter (Signed)
Pt was no show 09/12/15 1:30pm for cpe appt, pt has not rescheduled, charge or no charge?

## 2015-09-19 NOTE — Telephone Encounter (Signed)
No , is okay 

## 2015-09-23 ENCOUNTER — Ambulatory Visit (INDEPENDENT_AMBULATORY_CARE_PROVIDER_SITE_OTHER): Payer: Medicare Other | Admitting: Cardiovascular Disease

## 2015-09-23 ENCOUNTER — Encounter: Payer: Self-pay | Admitting: Cardiovascular Disease

## 2015-09-23 VITALS — BP 134/86 | HR 87 | Ht 67.0 in | Wt 211.8 lb

## 2015-09-23 DIAGNOSIS — I5032 Chronic diastolic (congestive) heart failure: Secondary | ICD-10-CM

## 2015-09-23 DIAGNOSIS — I5033 Acute on chronic diastolic (congestive) heart failure: Secondary | ICD-10-CM | POA: Diagnosis not present

## 2015-09-23 DIAGNOSIS — I10 Essential (primary) hypertension: Secondary | ICD-10-CM

## 2015-09-23 DIAGNOSIS — I4891 Unspecified atrial fibrillation: Secondary | ICD-10-CM | POA: Diagnosis not present

## 2015-09-23 MED ORDER — POTASSIUM CHLORIDE ER 10 MEQ PO CPCR: ORAL_CAPSULE | ORAL | Status: AC

## 2015-09-23 NOTE — Progress Notes (Signed)
Cardiology Office Note Date:  09/25/2015   ID:  [name] CoveDwight L Pamer, DOB 07/29/1921, MRN [id number]  PCP:  Willow OraJose Paz, MD  Cardiologist:  Tonny Bollmanooper, Delise Simenson, MD    Chief Complaint  Patient presents with  . Fatigue     History of Present Illness: [name] is a 81 y.o. male who presents for follow-up of multiple medical problems.  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 Last cath in 06/2010 in setting of NSTEMI demonstrated patent grafts and subtotal occlusion of marginal branch treated medically. He is not on anticoagulation due to hemorrhoidal bleeding and frequent falls (Xarelto stopped in 2014). Has had chronic leg swelling requiring loop diuretic therapy with torsemide.   The patient complains of generalized weakness. He has difficulty walking with the aid of his walker because of weakness in the arms and legs. Notes gradual progression of his weakness. Complains of exertional dyspnea, sits to rest then can get up and go again. No chest pain or pressure. No lightheadedness or syncope. Has had 2 recent falls -  'gets his feet tangled'  Past Medical History  Diagnosis Date  . A-fib (HCC)     paroxysmal, s/p TEE-DCCV 03/2013;  was on coumadin , pt desired to stop it , no longer a candidate per cards (hemorrhoidal bleeding; falls)  . CAD (coronary artery disease)     a. MI angioplasty 93, b. s/p CABG 01/2006;   c.  NSTEMI 06/2010 - [facility]:  L-LAD ok, S-Dx ok, S-OM ok, S-PDA ok, small OM sub total occluded - Med Rx  . Chronic diastolic heart failure (HCC)   . Hyperlipidemia   . Hypertension   . Spinal stenosis      has seen Dr Ethelene Halamos before, s/p local injection which helped x a while   . GERD (gastroesophageal reflux disease)   . RLS (restless legs syndrome)     . Insomnia   . History of BPH     TURP '96 at [facility] 96, urethoplast 97 ( History of bladder outlet obstruction, chronic incontinence)  . B12 deficiency anemia   . Myocardial infarction (HCC) 01/1992; 06/2010  . Arthritis   . Hx of cardiovascular stress test     a.  Myoview (10/2005):  EF 62%, mild apical ischemia  . Hx of echocardiogram     a.  Echo (03/23/13):  mod LVH, EF 50-55%, no WMA, mild MR, mod LAE, mild RVE, mod RAE, mod TR    Past Surgical History  Procedure Laterality Date  . Coronary artery bypass graft      "CABG X4" (05/14/2013)  . Cystoscopy with urethral dilatation  1997    "fixed strictures from TURP" (05/14/2013)  . Inguinal hernia repair Right   . Carpal tunnel release Bilateral ~ 2005    Dr Teressa SenterSypher  . Pacemaker insertion  12/2006    MDT dual chamber PPM implanted for SSS  . Tee without cardioversion N/A 03/27/2013    Procedure: TRANSESOPHAGEAL ECHOCARDIOGRAM (TEE);  Surgeon: Lewayne BuntingBrian S Crenshaw, MD;  [facility];  Service: Cardiovascular;  Laterality: N/A;  . Cardioversion N/A 03/27/2013    Procedure: CARDIOVERSION;  Surgeon: Lewayne BuntingBrian S Crenshaw, MD;  [facility];  Service: Cardiovascular;  Laterality: N/A;  . Total knee arthroplasty Bilateral     right 2005, Left 2006.  Dr  Alusio  . Coronary angioplasty      "Dr. Riley Kill"  . Cataract extraction w/ intraocular lens  implant, bilateral Bilateral 2012  . Transurethral resection of prostate  1996  . Ep implantable device N/A 06/24/2015    Procedure:  PPM Generator Changeout;  Surgeon: [facility] Salvia, MD;  Location: [facility] INVASIVE CV LAB;  Service: Cardiovascular;  Laterality: N/A;  . Ep implantable device N/A 06/27/2015    Procedure: PPM Generator Changeout;  Surgeon: [facility] Salvia, MD;  Location: [facility] INVASIVE CV LAB;  Service: Cardiovascular;  Laterality: N/A;    Current Outpatient Prescriptions  Medication Sig Dispense Refill  . acetaminophen (TYLENOL) 500 MG tablet Take 500 mg by mouth every 8 (eight)  hours as needed for moderate pain (pain).     . B Complex-C (B-COMPLEX WITH VITAMIN C) tablet Take 1 tablet by mouth daily.    . beta carotene w/minerals (OCUVITE) tablet Take 1 tablet by mouth daily.    . carvedilol (COREG) 6.25 MG tablet Take 1 tablet (6.25 mg total) by mouth 2 (two) times daily with a meal. 60 tablet 6  . potassium chloride (MICRO-K) 10 MEQ CR capsule TAKE ONE CAPSULE BY MOUTH DAILY ALTERNATING WITH TWO CAPSULES BY MOUTH DAILY 140 capsule 3  . pramipexole (MIRAPEX) 0.125 MG tablet Take 1 tablet (0.125 mg total) by mouth 3 (three) times daily. 90 tablet 1  . torsemide (DEMADEX) 20 MG tablet TAKE 1 TABLET (20 MG TOTAL) BY MOUTH DAILY. 30 tablet 3   No current facility-administered medications for this visit.    Allergies:   Amlodipine; Atorvastatin; and Zinc   Social History:  The patient  reports that he quit smoking about 56 years ago. His smoking use included Cigarettes. He has a 30 pack-year smoking history. He has never used smokeless tobacco. He reports that he does not drink alcohol or use illicit drugs.   Family History:  The patient's  family history includes Cancer in his father; Stroke in his mother. There is no history of Heart disease or Diabetes.    ROS:  Please see the history of present illness.  Otherwise, review of systems is positive for weakness, gait instability, urinary incontinence.  All other systems are reviewed and negative.    PHYSICAL EXAM: VS:  BP 134/86 mmHg  Pulse 87  Ht  (1.702 m)  Wt 211 lb 12.8 oz (96.072 kg)  BMI 33.16 kg/m2 , BMI Body mass index is 33.16 kg/(m^2). GEN: elderly male, in no acute distress HEENT: normal Neck: JVP elevated with positive HJR, no masses. No carotid bruits Cardiac: irregularly irregular without murmur              Respiratory:  clear to auscultation bilaterally, normal work of breathing GI: soft, nontender, nondistended, + BS MS: no deformity or atrophy Ext: 2+ pretibial edema bilaterally Skin:  warm and dry, no rash Neuro:  Strength and sensation are intact Psych: euthymic mood, full affect  EKG:  EKG is ordered today. The ekg ordered today shows Electronic atrial pacemaker 87 bpm, RBBB  Recent Labs: 06/13/2015: BUN 16; Creatinine, Ser 1.21; Hemoglobin 11.6*; Platelets 160.0; Potassium 4.6; Sodium 139   Lipid Panel     Component Value Date/Time   CHOL  06/05/2010 0435    158        ATP III CLASSIFICATION:  <200     mg/dL   Desirable  914-782  mg/dL   Borderline High  >=956    mg/dL   High  TRIG 82 06/05/2010 0435   HDL 30* 06/05/2010 0435   CHOLHDL 5.3 06/05/2010 0435   VLDL 16 06/05/2010 0435   LDLCALC * 06/05/2010 0435    112        Total Cholesterol/HDL:CHD Risk Coronary Heart Disease Risk Table                     Men   Women  1/2 Average Risk   3.4   3.3  Average Risk       5.0   4.4  2 X Average Risk   9.6   7.1  3 X Average Risk  23.4   11.0        Use the calculated Patient Ratio above and the CHD Risk Table to determine the patient's CHD Risk.        ATP III CLASSIFICATION (LDL):  <100     mg/dL   Optimal  161-096  mg/dL   Near or Above                    Optimal  130-159  mg/dL   Borderline  045-409  mg/dL   High  >811     mg/dL   Very High      Wt Readings from Last 3 Encounters:  09/23/15 211 lb 12.8 oz (96.072 kg)  06/27/15 202 lb (91.627 kg)  06/24/15 202 lb (91.627 kg)     Cardiac Studies Reviewed: TEE 03/27/2013: Study Conclusions  - Left ventricle: Systolic function was normal. The estimated ejection fraction was in the range of 50% to 55%. Akinesis of the basal/mid lateral wall. - Aortic valve: No evidence of vegetation. - Mitral valve: No evidence of vegetation. Mild regurgitation. - Left atrium: The atrium was moderately dilated. No evidence of thrombus in the atrial cavity or appendage. There was mild spontaneous echo contrast ("smoke") in the appendage. - Right atrium: The atrium was moderately  dilated. - Atrial septum: No defect or patent foramen ovale was identified. - Tricuspid valve: No evidence of vegetation. Moderate regurgitation. - Pulmonic valve: No evidence of vegetation.  ASSESSMENT AND PLAN: 1.  Acute on chronic diastolic heart failure: NYHA III. Increase torsemide to 40 tomorrow x 1, then 20 mg daily. Otherwise continue current Rx. Reviewed importance of sodium restriction. Advised to try his best to take diuretics every day - he skips sometimes because of problems with incontinence. Overall prognosis is guarded as he continue to decline secondary to advancing age.  2. Atrial fibrillation: not a candidate for anticoagulation secondary to recurrent falls.  3. Hyperlipidemia: statin intolerant. Observation in setting of advanced age.  4. HTN: continue carvedilol  5. CAD s/p CABG: no angina. Continue same Rx.  Current medicines are reviewed with the patient today.  The patient does not have concerns regarding medicines.  Labs/ tests ordered today include:   Orders Placed This Encounter  Procedures  . EKG 12-Lead    Disposition:   FU 6 months  Signed, Tonny Bollman, MD  09/25/2015 2:04 PM    [facility] [address] [place], [place], [place]  [address] Phone: [phone]; Fax: [phone]

## 2015-09-23 NOTE — Patient Instructions (Signed)
Medication Instructions:  Your physician has recommended you make the following change in your medication:  1. Take 40mg  of Torsemide tomorrow and then resume 20mg  daily on Sunday  Labwork: No new orders.   Testing/Procedures: No new orders.   Follow-Up: Your physician wants you to follow-up in: 6 MONTHS with Dr Excell Seltzerooper.  You will receive a reminder letter in the mail two months in advance. If you don't receive a letter, please call our office to schedule the follow-up appointment.   Any Other Special Instructions Will Be Listed Below (If Applicable).

## 2015-09-29 ENCOUNTER — Ambulatory Visit (INDEPENDENT_AMBULATORY_CARE_PROVIDER_SITE_OTHER): Payer: Medicare Other | Admitting: Family Medicine

## 2015-09-29 ENCOUNTER — Encounter: Payer: Self-pay | Admitting: Family Medicine

## 2015-09-29 VITALS — BP 124/82 | HR 76 | Temp 98.2°F | Wt 205.0 lb

## 2015-09-29 DIAGNOSIS — R296 Repeated falls: Secondary | ICD-10-CM | POA: Diagnosis not present

## 2015-09-29 DIAGNOSIS — Z23 Encounter for immunization: Secondary | ICD-10-CM | POA: Diagnosis not present

## 2015-09-29 NOTE — Patient Instructions (Signed)
Fall Prevention in the Home  Falls can cause injuries and can affect people from all age groups. There are many simple things that you can do to make your home safe and to help prevent falls. WHAT CAN I DO ON THE OUTSIDE OF MY HOME?  Regularly repair the edges of walkways and driveways and fix any cracks.  Remove high doorway thresholds.  Trim any shrubbery on the main path into your home.  Use bright outdoor lighting.  Clear walkways of debris and clutter, including tools and rocks.  Regularly check that handrails are securely fastened and in good repair. Both sides of any steps should have handrails.  Install guardrails along the edges of any raised decks or porches.  Have leaves, snow, and ice cleared regularly.  Use sand or salt on walkways during winter months.  In the garage, clean up any spills right away, including grease or oil spills. WHAT CAN I DO IN THE BATHROOM?  Use night lights.  Install grab bars by the toilet and in the tub and shower. Do not use towel bars as grab bars.  Use non-skid mats or decals on the floor of the tub or shower.  If you need to sit down while you are in the shower, use a plastic, non-slip stool..  Keep the floor dry. Immediately clean up any water that spills on the floor.  Remove soap buildup in the tub or shower on a regular basis.  Attach bath mats securely with double-sided non-slip rug tape.  Remove throw rugs and other tripping hazards from the floor. WHAT CAN I DO IN THE BEDROOM?  Use night lights.  Make sure that a bedside light is easy to reach.  Do not use oversized bedding that drapes onto the floor.  Have a firm chair that has side arms to use for getting dressed.  Remove throw rugs and other tripping hazards from the floor. WHAT CAN I DO IN THE KITCHEN?   Clean up any spills right away.  Avoid walking on wet floors.  Place frequently used items in easy-to-reach places.  If you need to reach for something  above you, use a sturdy step stool that has a grab bar.  Keep electrical cables out of the way.  Do not use floor polish or wax that makes floors slippery. If you have to use wax, make sure that it is non-skid floor wax.  Remove throw rugs and other tripping hazards from the floor. WHAT CAN I DO IN THE STAIRWAYS?  Do not leave any items on the stairs.  Make sure that there are handrails on both sides of the stairs. Fix handrails that are broken or loose. Make sure that handrails are as long as the stairways.  Check any carpeting to make sure that it is firmly attached to the stairs. Fix any carpet that is loose or worn.  Avoid having throw rugs at the top or bottom of stairways, or secure the rugs with carpet tape to prevent them from moving.  Make sure that you have a light switch at the top of the stairs and the bottom of the stairs. If you do not have them, have them installed. WHAT ARE SOME OTHER FALL PREVENTION TIPS?  Wear closed-toe shoes that fit well and support your feet. Wear shoes that have rubber soles or low heels.  When you use a stepladder, make sure that it is completely opened and that the sides are firmly locked. Have someone hold the ladder while you   are using it. Do not climb a closed stepladder.  Add color or contrast paint or tape to grab bars and handrails in your home. Place contrasting color strips on the first and last steps.  Use mobility aids as needed, such as canes, walkers, scooters, and crutches.  Turn on lights if it is dark. Replace any light bulbs that burn out.  Set up furniture so that there are clear paths. Keep the furniture in the same spot.  Fix any uneven floor surfaces.  Choose a carpet design that does not hide the edge of steps of a stairway.  Be aware of any and all pets.  Review your medicines with your healthcare provider. Some medicines can cause dizziness or changes in blood pressure, which increase your risk of falling. Talk  with your health care provider about other ways that you can decrease your risk of falls. This may include working with a physical therapist or trainer to improve your strength, balance, and endurance.   This information is not intended to replace advice given to you by your health care provider. Make sure you discuss any questions you have with your health care provider.   Document Released: 11/09/2002 Document Revised: 04/05/2015 Document Reviewed: 12/24/2014 Elsevier Interactive Patient Education 2016 [employer]  

## 2015-09-29 NOTE — Progress Notes (Signed)
Patient ID: [name], male    DOB: 1921/02/08  Age: 81 y.o. MRN: [id number]    Subjective:  Subjective HPI [name] presents for power wheelchair evaluation.  Pt is unable to use a manual wheelchair due to lack of strength in his arms and legs and his wife is unable to push him.   Pt has been falling a lot lately (30x in last 6 months).  He has a hx of ventricular tachy, syncope, rls, s/p pacemaker, hyperlipidemia, htn, gait disturbance, chronic diastolic heart failure,  A fib, arf, osteoarthritis.  Pt is currently using a walker and is falling due to gait/ balance problems.  Pt is ablde to shower and dress himself with assistance and seat in shower.  He is unable to do much by himself.  He will need this indefinitely.  He is unable to transfer himself from wheelchair-- he needs a lot of help.  EMS has been called several times to pick him off the floor.  One day it was 2x .   Pt is able to use a joy stick and will be able to steer it in the house.     Review of Systems  Constitutional: Negative for diaphoresis, appetite change, fatigue and unexpected weight change.  HENT: Positive for hearing loss. Negative for congestion, ear pain, nosebleeds, postnasal drip, rhinorrhea, sinus pressure, sneezing and tinnitus.   Eyes: Negative for photophobia, pain, discharge, redness, itching and visual disturbance.  Respiratory: Negative.  Negative for cough, chest tightness, shortness of breath and wheezing.   Cardiovascular: Negative.  Negative for chest pain, palpitations and leg swelling.  Gastrointestinal: Negative for abdominal pain, constipation, blood in stool, abdominal distention and anal bleeding.  Endocrine: Negative.  Negative for cold intolerance, heat intolerance, polydipsia, polyphagia and polyuria.  Genitourinary: Negative.  Negative for dysuria, frequency and difficulty urinating.  Musculoskeletal: Negative.   Skin: Negative.   Allergic/Immunologic: Negative.   Neurological:  Positive for weakness. Negative for dizziness, syncope, light-headedness, numbness and headaches.  Psychiatric/Behavioral: Negative for suicidal ideas, confusion, sleep disturbance, dysphoric mood, decreased concentration and agitation. The patient is not nervous/anxious.     History Past Medical History  Diagnosis Date  . A-fib (HCC)     paroxysmal, s/p TEE-DCCV 03/2013;  was on coumadin , pt desired to stop it , no longer a candidate per cards (hemorrhoidal bleeding; falls)  . CAD (coronary artery disease)     a. MI angioplasty 93, b. s/p CABG 01/2006;   c.  NSTEMI 06/2010 - [facility]:  L-LAD ok, S-Dx ok, S-OM ok, S-PDA ok, small OM sub total occluded - Med Rx  . Chronic diastolic heart failure (HCC)   . Hyperlipidemia   . Hypertension   . Spinal stenosis      has seen Dr Ethelene Hal before, s/p local injection which helped x a while   . GERD (gastroesophageal reflux disease)   . RLS (restless legs syndrome)   . Insomnia   . History of BPH     TURP '96 at [facility] 96, urethoplast 97 ( History of bladder outlet obstruction, chronic incontinence)  . B12 deficiency anemia   . Myocardial infarction (HCC) 01/1992; 06/2010  . Arthritis   . Hx of cardiovascular stress test     a.  Myoview (10/2005):  EF 62%, mild apical ischemia  . Hx of echocardiogram     a.  Echo (03/23/13):  mod LVH, EF 50-55%, no WMA, mild MR, mod LAE, mild RVE, mod RAE, mod TR  He has past surgical history that includes Coronary artery bypass graft; Cystoscopy with urethral dilatation (1997); Inguinal hernia repair (Right); Carpal tunnel release (Bilateral, ~ 2005); Pacemaker insertion (12/2006); TEE without cardioversion (N/A, 03/27/2013); Cardioversion (N/A, 03/27/2013); Total knee arthroplasty (Bilateral); Coronary angioplasty; Cataract extraction w/ intraocular lens  implant, bilateral (Bilateral, 2012); Transurethral resection of prostate (1996); Cardiac catheterization (N/A, 06/24/2015); and Cardiac catheterization (N/A, 06/27/2015).    His family history includes Cancer in his father; Stroke in his mother. There is no history of Heart disease or Diabetes.He reports that he quit smoking about 56 years ago. His smoking use included Cigarettes. He has a 30 pack-year smoking history. He has never used smokeless tobacco. He reports that he does not drink alcohol or use illicit drugs.  Current Outpatient Prescriptions on File Prior to Visit  Medication Sig Dispense Refill  . acetaminophen (TYLENOL) 500 MG tablet Take 500 mg by mouth every 8 (eight) hours as needed for moderate pain (pain).     . B Complex-C (B-COMPLEX WITH VITAMIN C) tablet Take 1 tablet by mouth daily.    . beta carotene w/minerals (OCUVITE) tablet Take 1 tablet by mouth daily.    . carvedilol (COREG) 6.25 MG tablet Take 1 tablet (6.25 mg total) by mouth 2 (two) times daily with a meal. 60 tablet 6  . potassium chloride (MICRO-K) 10 MEQ CR capsule TAKE ONE CAPSULE BY MOUTH DAILY ALTERNATING WITH TWO CAPSULES BY MOUTH DAILY 140 capsule 3  . pramipexole (MIRAPEX) 0.125 MG tablet Take 1 tablet (0.125 mg total) by mouth 3 (three) times daily. 90 tablet 1  . torsemide (DEMADEX) 20 MG tablet TAKE 1 TABLET (20 MG TOTAL) BY MOUTH DAILY. 30 tablet 3   No current facility-administered medications on file prior to visit.     Objective:  Objective Physical Exam  Constitutional: He is oriented to person, place, and time. Vital signs are normal. He appears well-developed and well-nourished. He is sleeping.  HENT:  Head: Normocephalic and atraumatic.  Mouth/Throat: Oropharynx is clear and moist.  Eyes: EOM are normal. Pupils are equal, round, and reactive to light.  Neck: Normal range of motion. Neck supple. No thyromegaly present.  Cardiovascular: Normal rate and regular rhythm.   Pulmonary/Chest: Effort normal and breath sounds normal. No respiratory distress. He has no wheezes. He has no rales. He exhibits no tenderness.  Musculoskeletal: He exhibits no edema or  tenderness.  Neurological: He is alert and oriented to person, place, and time. He displays no tremor. He displays no seizure activity. Coordination and gait abnormal.  Strength in low ext 3/5                   Upper ext 3/5  Unsteady on his feet without holding on to something  Skin: Skin is warm and dry.  Psychiatric: He has a normal mood and affect. His behavior is normal. Judgment and thought content normal.   BP 124/82 mmHg  Pulse 76  Temp(Src) 98.2 F (36.8 C) (Tympanic)  Wt 205 lb (92.987 kg)  SpO2 97% Wt Readings from Last 3 Encounters:  09/29/15 205 lb (92.987 kg)  09/23/15 211 lb 12.8 oz (96.072 kg)  06/27/15 202 lb (91.627 kg)     Lab Results  Component Value Date   WBC 6.6 06/13/2015   HGB 11.6* 06/13/2015   HCT 34.8* 06/13/2015   PLT 160.0 06/13/2015   GLUCOSE 92 06/13/2015   CHOL  06/05/2010    158  ATP III CLASSIFICATION:  <200     mg/dL   Desirable  [id number]  mg/dL   Borderline High  >=811>=240    mg/dL   High          TRIG 82 06/05/2010   HDL 30* 06/05/2010   LDLCALC * 06/05/2010    112        Total Cholesterol/HDL:CHD Risk Coronary Heart Disease Risk Table                     Men   Women  1/2 Average Risk   3.4   3.3  Average Risk       5.0   4.4  2 X Average Risk   9.6   7.1  3 X Average Risk  23.4   11.0        Use the calculated Patient Ratio above and the CHD Risk Table to determine the patient's CHD Risk.        ATP III CLASSIFICATION (LDL):  <100     mg/dL   Optimal  [id number]  mg/dL   Near or Above                    Optimal  130-159  mg/dL   Borderline  [id number]  mg/dL   High  >086>190     mg/dL   Very High   ALT 14 57/84/696209/03/2013   AST 19 08/06/2013   NA 139 06/13/2015   K 4.6 06/13/2015   CL 105 06/13/2015   CREATININE 1.21 06/13/2015   BUN 16 06/13/2015   CO2 29 06/13/2015   TSH 0.81 07/15/2014   PSA 0.52 05/04/2010   INR 1.0 06/13/2015   HGBA1C 5.7 07/15/2014    No results found.   Assessment & Plan:  Plan I am having Mr.  [facility] maintain his beta carotene w/minerals, acetaminophen, B-complex with vitamin C, carvedilol, pramipexole, torsemide, and potassium chloride.  No orders of the defined types were placed in this encounter.    Problem List Items Addressed This Visit    Frequent falls    Home health to start [facility] Will discuss with pcp as well       Other Visit Diagnoses    Falls frequently    -  Primary    Need for prophylactic vaccination and inoculation against influenza        Relevant Orders    Flu vaccine HIGH DOSE PF (Fluzone High dose) (Completed)       Follow-up: Return if symptoms worsen or fail to improve.  Loreen FreudYvonne Lowne, DO

## 2015-09-29 NOTE — Progress Notes (Signed)
Pre visit review using our clinic review tool, if applicable. No additional management support is needed unless otherwise documented below in the visit note. 

## 2015-09-29 NOTE — Assessment & Plan Note (Signed)
Home health to start [facility] Will discuss with pcp as well

## 2015-09-30 ENCOUNTER — Ambulatory Visit: Payer: Self-pay | Admitting: Internal Medicine

## 2015-10-04 DIAGNOSIS — M6281 Muscle weakness (generalized): Secondary | ICD-10-CM | POA: Diagnosis not present

## 2015-10-04 DIAGNOSIS — R296 Repeated falls: Secondary | ICD-10-CM | POA: Diagnosis not present

## 2015-10-04 DIAGNOSIS — R262 Difficulty in walking, not elsewhere classified: Secondary | ICD-10-CM | POA: Diagnosis not present

## 2015-10-05 ENCOUNTER — Inpatient Hospital Stay (HOSPITAL_COMMUNITY)
Admission: EM | Admit: 2015-10-05 | Discharge: 2015-10-13 | DRG: 061 | Disposition: A | Discharge status: Skilled Nursing Facility | Payer: Medicare Other | Attending: Neurology | Admitting: Neurology

## 2015-10-05 ENCOUNTER — Inpatient Hospital Stay (HOSPITAL_COMMUNITY): Payer: Medicare Other

## 2015-10-05 ENCOUNTER — Emergency Department (HOSPITAL_COMMUNITY): Payer: Medicare Other

## 2015-10-05 ENCOUNTER — Encounter (HOSPITAL_COMMUNITY): Payer: Self-pay | Admitting: Emergency Medicine

## 2015-10-05 DIAGNOSIS — D519 Vitamin B12 deficiency anemia, unspecified: Secondary | ICD-10-CM | POA: Diagnosis present

## 2015-10-05 DIAGNOSIS — I110 Hypertensive heart disease with heart failure: Secondary | ICD-10-CM | POA: Diagnosis not present

## 2015-10-05 DIAGNOSIS — Z87891 Personal history of nicotine dependence: Secondary | ICD-10-CM | POA: Diagnosis not present

## 2015-10-05 DIAGNOSIS — I081 Rheumatic disorders of both mitral and tricuspid valves: Secondary | ICD-10-CM | POA: Diagnosis not present

## 2015-10-05 DIAGNOSIS — I480 Paroxysmal atrial fibrillation: Secondary | ICD-10-CM | POA: Diagnosis not present

## 2015-10-05 DIAGNOSIS — M79604 Pain in right leg: Secondary | ICD-10-CM

## 2015-10-05 DIAGNOSIS — K210 Gastro-esophageal reflux disease with esophagitis: Secondary | ICD-10-CM | POA: Diagnosis not present

## 2015-10-05 DIAGNOSIS — I2510 Atherosclerotic heart disease of native coronary artery without angina pectoris: Secondary | ICD-10-CM | POA: Diagnosis present

## 2015-10-05 DIAGNOSIS — R29705 NIHSS score 5: Secondary | ICD-10-CM | POA: Diagnosis not present

## 2015-10-05 DIAGNOSIS — I6340 Cerebral infarction due to embolism of unspecified cerebral artery: Secondary | ICD-10-CM | POA: Diagnosis not present

## 2015-10-05 DIAGNOSIS — E785 Hyperlipidemia, unspecified: Secondary | ICD-10-CM | POA: Diagnosis not present

## 2015-10-05 DIAGNOSIS — K219 Gastro-esophageal reflux disease without esophagitis: Secondary | ICD-10-CM | POA: Diagnosis present

## 2015-10-05 DIAGNOSIS — I4891 Unspecified atrial fibrillation: Secondary | ICD-10-CM | POA: Diagnosis not present

## 2015-10-05 DIAGNOSIS — I639 Cerebral infarction, unspecified: Secondary | ICD-10-CM | POA: Diagnosis not present

## 2015-10-05 DIAGNOSIS — L8990 Pressure ulcer of unspecified site, unspecified stage: Secondary | ICD-10-CM | POA: Insufficient documentation

## 2015-10-05 DIAGNOSIS — R414 Neurologic neglect syndrome: Secondary | ICD-10-CM | POA: Diagnosis not present

## 2015-10-05 DIAGNOSIS — T502X5A Adverse effect of carbonic-anhydrase inhibitors, benzothiadiazides and other diuretics, initial encounter: Secondary | ICD-10-CM | POA: Diagnosis not present

## 2015-10-05 DIAGNOSIS — I252 Old myocardial infarction: Secondary | ICD-10-CM | POA: Diagnosis not present

## 2015-10-05 DIAGNOSIS — N179 Acute kidney failure, unspecified: Secondary | ICD-10-CM | POA: Diagnosis not present

## 2015-10-05 DIAGNOSIS — R0989 Other specified symptoms and signs involving the circulatory and respiratory systems: Secondary | ICD-10-CM | POA: Insufficient documentation

## 2015-10-05 DIAGNOSIS — I10 Essential (primary) hypertension: Secondary | ICD-10-CM | POA: Diagnosis present

## 2015-10-05 DIAGNOSIS — G4700 Insomnia, unspecified: Secondary | ICD-10-CM | POA: Diagnosis not present

## 2015-10-05 DIAGNOSIS — I63411 Cerebral infarction due to embolism of right middle cerebral artery: Secondary | ICD-10-CM | POA: Diagnosis not present

## 2015-10-05 DIAGNOSIS — Z8709 Personal history of other diseases of the respiratory system: Secondary | ICD-10-CM | POA: Diagnosis not present

## 2015-10-05 DIAGNOSIS — D649 Anemia, unspecified: Secondary | ICD-10-CM | POA: Diagnosis not present

## 2015-10-05 DIAGNOSIS — E876 Hypokalemia: Secondary | ICD-10-CM | POA: Diagnosis not present

## 2015-10-05 DIAGNOSIS — I6789 Other cerebrovascular disease: Secondary | ICD-10-CM

## 2015-10-05 DIAGNOSIS — I953 Hypotension of hemodialysis: Secondary | ICD-10-CM | POA: Diagnosis not present

## 2015-10-05 DIAGNOSIS — I878 Other specified disorders of veins: Secondary | ICD-10-CM | POA: Diagnosis not present

## 2015-10-05 DIAGNOSIS — J810 Acute pulmonary edema: Secondary | ICD-10-CM | POA: Diagnosis not present

## 2015-10-05 DIAGNOSIS — R1310 Dysphagia, unspecified: Secondary | ICD-10-CM | POA: Diagnosis present

## 2015-10-05 DIAGNOSIS — E860 Dehydration: Secondary | ICD-10-CM | POA: Diagnosis not present

## 2015-10-05 DIAGNOSIS — Z951 Presence of aortocoronary bypass graft: Secondary | ICD-10-CM

## 2015-10-05 DIAGNOSIS — G2581 Restless legs syndrome: Secondary | ICD-10-CM | POA: Diagnosis not present

## 2015-10-05 DIAGNOSIS — I5032 Chronic diastolic (congestive) heart failure: Secondary | ICD-10-CM | POA: Diagnosis present

## 2015-10-05 DIAGNOSIS — R062 Wheezing: Secondary | ICD-10-CM

## 2015-10-05 DIAGNOSIS — R451 Restlessness and agitation: Secondary | ICD-10-CM | POA: Diagnosis not present

## 2015-10-05 DIAGNOSIS — Z5189 Encounter for other specified aftercare: Secondary | ICD-10-CM | POA: Diagnosis not present

## 2015-10-05 DIAGNOSIS — Z950 Presence of cardiac pacemaker: Secondary | ICD-10-CM | POA: Diagnosis not present

## 2015-10-05 DIAGNOSIS — I509 Heart failure, unspecified: Secondary | ICD-10-CM | POA: Diagnosis not present

## 2015-10-05 DIAGNOSIS — R531 Weakness: Secondary | ICD-10-CM | POA: Diagnosis not present

## 2015-10-05 DIAGNOSIS — R1312 Dysphagia, oropharyngeal phase: Secondary | ICD-10-CM | POA: Diagnosis not present

## 2015-10-05 DIAGNOSIS — M4806 Spinal stenosis, lumbar region: Secondary | ICD-10-CM | POA: Diagnosis not present

## 2015-10-05 DIAGNOSIS — R296 Repeated falls: Secondary | ICD-10-CM | POA: Diagnosis not present

## 2015-10-05 DIAGNOSIS — R41841 Cognitive communication deficit: Secondary | ICD-10-CM | POA: Diagnosis not present

## 2015-10-05 DIAGNOSIS — R262 Difficulty in walking, not elsewhere classified: Secondary | ICD-10-CM | POA: Diagnosis not present

## 2015-10-05 DIAGNOSIS — I63131 Cerebral infarction due to embolism of right carotid artery: Secondary | ICD-10-CM | POA: Diagnosis not present

## 2015-10-05 DIAGNOSIS — I63031 Cerebral infarction due to thrombosis of right carotid artery: Secondary | ICD-10-CM | POA: Diagnosis not present

## 2015-10-05 DIAGNOSIS — M6281 Muscle weakness (generalized): Secondary | ICD-10-CM | POA: Diagnosis not present

## 2015-10-05 DIAGNOSIS — I69354 Hemiplegia and hemiparesis following cerebral infarction affecting left non-dominant side: Secondary | ICD-10-CM | POA: Diagnosis not present

## 2015-10-05 DIAGNOSIS — R278 Other lack of coordination: Secondary | ICD-10-CM | POA: Diagnosis not present

## 2015-10-05 DIAGNOSIS — R4781 Slurred speech: Secondary | ICD-10-CM | POA: Diagnosis not present

## 2015-10-05 LAB — MRSA PCR SCREENING: MRSA BY PCR: POSITIVE — AB

## 2015-10-05 LAB — I-STAT CHEM 8, ED
BUN: 28 mg/dL — ABNORMAL HIGH (ref 6–20)
Calcium, Ion: 1.08 mmol/L — ABNORMAL LOW (ref 1.13–1.30)
Chloride: 102 mmol/L (ref 101–111)
Creatinine, Ser: 1.4 mg/dL — ABNORMAL HIGH (ref 0.61–1.24)
Glucose, Bld: 148 mg/dL — ABNORMAL HIGH (ref 65–99)
HEMATOCRIT: 36 % — AB (ref 39.0–52.0)
HEMOGLOBIN: 12.2 g/dL — AB (ref 13.0–17.0)
Potassium: 4.1 mmol/L (ref 3.5–5.1)
SODIUM: 143 mmol/L (ref 135–145)
TCO2: 27 mmol/L (ref 0–100)

## 2015-10-05 LAB — CBC
HCT: 34 % — ABNORMAL LOW (ref 39.0–52.0)
Hemoglobin: 11.1 g/dL — ABNORMAL LOW (ref 13.0–17.0)
MCH: 30.4 pg (ref 26.0–34.0)
MCHC: 32.6 g/dL (ref 30.0–36.0)
MCV: 93.2 fL (ref 78.0–100.0)
Platelets: 250 10*3/uL (ref 150–400)
RBC: 3.65 MIL/uL — ABNORMAL LOW (ref 4.22–5.81)
RDW: 15.4 % (ref 11.5–15.5)
WBC: 7.1 10*3/uL (ref 4.0–10.5)

## 2015-10-05 LAB — CBG MONITORING, ED: Glucose-Capillary: 121 mg/dL — ABNORMAL HIGH (ref 65–99)

## 2015-10-05 LAB — COMPREHENSIVE METABOLIC PANEL
ALT: 75 U/L — ABNORMAL HIGH (ref 17–63)
AST: 95 U/L — AB (ref 15–41)
Albumin: 3.3 g/dL — ABNORMAL LOW (ref 3.5–5.0)
Alkaline Phosphatase: 343 U/L — ABNORMAL HIGH (ref 38–126)
Anion gap: 11 (ref 5–15)
BUN: 25 mg/dL — AB (ref 6–20)
CHLORIDE: 104 mmol/L (ref 101–111)
CO2: 26 mmol/L (ref 22–32)
Calcium: 8.9 mg/dL (ref 8.9–10.3)
Creatinine, Ser: 1.34 mg/dL — ABNORMAL HIGH (ref 0.61–1.24)
GFR, EST AFRICAN AMERICAN: 52 mL/min — AB (ref >60)
GFR, EST NON AFRICAN AMERICAN: 45 mL/min — AB (ref >60)
Glucose, Bld: 147 mg/dL — ABNORMAL HIGH (ref 65–99)
POTASSIUM: 4.2 mmol/L (ref 3.5–5.1)
SODIUM: 141 mmol/L (ref 135–145)
Total Bilirubin: 1 mg/dL (ref 0.3–1.2)
Total Protein: 7.5 g/dL (ref 6.5–8.1)

## 2015-10-05 LAB — DIFFERENTIAL
BASOS PCT: 0 %
Basophils Absolute: 0 10*3/uL (ref 0.0–0.1)
EOS ABS: 0.3 10*3/uL (ref 0.0–0.7)
Eosinophils Relative: 4 %
Lymphocytes Relative: 18 %
Lymphs Abs: 1.3 10*3/uL (ref 0.7–4.0)
MONO ABS: 0.6 10*3/uL (ref 0.1–1.0)
Monocytes Relative: 8 %
NEUTROS ABS: 5 10*3/uL (ref 1.7–7.7)
Neutrophils Relative %: 70 %

## 2015-10-05 LAB — I-STAT TROPONIN, ED: TROPONIN I, POC: 0.02 ng/mL (ref 0.00–0.08)

## 2015-10-05 LAB — APTT: aPTT: 39 seconds — ABNORMAL HIGH (ref 24–37)

## 2015-10-05 LAB — PROTIME-INR
INR: 1.07 (ref 0.00–1.49)
Prothrombin Time: 14.1 seconds (ref 11.6–15.2)

## 2015-10-05 LAB — GLUCOSE, CAPILLARY: Glucose-Capillary: 105 mg/dL — ABNORMAL HIGH (ref 65–99)

## 2015-10-05 MED ORDER — FENTANYL CITRATE (PF) 100 MCG/2ML IJ SOLN
25.0000 ug | INTRAMUSCULAR | Status: DC | PRN
  Administered 2015-10-05 – 2015-10-11 (×7): 25 ug via INTRAVENOUS
  Filled 2015-10-05 (×7): qty 2

## 2015-10-05 MED ORDER — ALTEPLASE (STROKE) FULL DOSE INFUSION
0.9000 mg/kg | Freq: Once | INTRAVENOUS | Status: AC
  Administered 2015-10-05: 79 mg via INTRAVENOUS
  Filled 2015-10-05: qty 79

## 2015-10-05 MED ORDER — HYDROCODONE-ACETAMINOPHEN 5-325 MG PO TABS
1.0000 | ORAL_TABLET | ORAL | Status: DC | PRN
  Administered 2015-10-05 – 2015-10-12 (×17): 1 via ORAL
  Filled 2015-10-05 (×18): qty 1

## 2015-10-05 MED ORDER — PANTOPRAZOLE SODIUM 40 MG IV SOLR
40.0000 mg | Freq: Every day | INTRAVENOUS | Status: DC
  Administered 2015-10-05 – 2015-10-06 (×2): 40 mg via INTRAVENOUS
  Filled 2015-10-05 (×2): qty 40

## 2015-10-05 MED ORDER — SODIUM CHLORIDE 0.9 % IV SOLN
INTRAVENOUS | Status: DC
  Administered 2015-10-05 – 2015-10-08 (×3): via INTRAVENOUS

## 2015-10-05 MED ORDER — MUPIROCIN 2 % EX OINT
1.0000 "application " | TOPICAL_OINTMENT | Freq: Two times a day (BID) | CUTANEOUS | Status: AC
  Administered 2015-10-06 – 2015-10-10 (×9): 1 via NASAL
  Filled 2015-10-05 (×2): qty 22

## 2015-10-05 MED ORDER — STROKE: EARLY STAGES OF RECOVERY BOOK
Freq: Once | Status: AC
  Administered 2015-10-05: 18:00:00
  Filled 2015-10-05: qty 1

## 2015-10-05 MED ORDER — SODIUM CHLORIDE 0.9 % IV BOLUS (SEPSIS)
500.0000 mL | Freq: Once | INTRAVENOUS | Status: AC
  Administered 2015-10-05: 500 mL via INTRAVENOUS

## 2015-10-05 MED ORDER — SENNOSIDES-DOCUSATE SODIUM 8.6-50 MG PO TABS
1.0000 | ORAL_TABLET | Freq: Every evening | ORAL | Status: DC | PRN
Start: 2015-10-05 — End: 2015-10-13
  Administered 2015-10-08 – 2015-10-12 (×3): 1 via ORAL
  Filled 2015-10-05 (×3): qty 1

## 2015-10-05 MED ORDER — ALPRAZOLAM 0.5 MG PO TABS
0.5000 mg | ORAL_TABLET | Freq: Every evening | ORAL | Status: DC | PRN
  Administered 2015-10-05 – 2015-10-11 (×6): 0.5 mg via ORAL
  Filled 2015-10-05 (×6): qty 1

## 2015-10-05 MED ORDER — PRAMIPEXOLE DIHYDROCHLORIDE 0.125 MG PO TABS
0.1250 mg | ORAL_TABLET | Freq: Three times a day (TID) | ORAL | Status: DC
  Administered 2015-10-05 – 2015-10-13 (×23): 0.125 mg via ORAL
  Filled 2015-10-05 (×27): qty 1

## 2015-10-05 MED ORDER — ACETAMINOPHEN 325 MG PO TABS
650.0000 mg | ORAL_TABLET | ORAL | Status: DC | PRN
  Administered 2015-10-06 – 2015-10-13 (×5): 650 mg via ORAL
  Filled 2015-10-05 (×5): qty 2

## 2015-10-05 MED ORDER — CHLORHEXIDINE GLUCONATE CLOTH 2 % EX PADS
6.0000 | MEDICATED_PAD | Freq: Every day | CUTANEOUS | Status: AC
  Administered 2015-10-07 – 2015-10-10 (×4): 6 via TOPICAL

## 2015-10-05 MED ORDER — FENTANYL CITRATE (PF) 100 MCG/2ML IJ SOLN
INTRAMUSCULAR | Status: AC
  Filled 2015-10-05: qty 2

## 2015-10-05 MED ORDER — LABETALOL HCL 5 MG/ML IV SOLN
10.0000 mg | INTRAVENOUS | Status: DC | PRN
Start: 2015-10-05 — End: 2015-10-13

## 2015-10-05 MED ORDER — TORSEMIDE 20 MG PO TABS
20.0000 mg | ORAL_TABLET | Freq: Once | ORAL | Status: DC
  Filled 2015-10-05: qty 1

## 2015-10-05 MED ORDER — ACETAMINOPHEN 650 MG RE SUPP: 650.0000 mg | RECTAL | Status: DC | PRN

## 2015-10-05 MED ORDER — TORSEMIDE 20 MG PO TABS: 20.0000 mg | ORAL_TABLET | Freq: Once | ORAL | Status: DC

## 2015-10-05 MED ORDER — CARVEDILOL 6.25 MG PO TABS
6.2500 mg | ORAL_TABLET | Freq: Two times a day (BID) | ORAL | Status: DC
  Administered 2015-10-06 – 2015-10-10 (×9): 6.25 mg via ORAL
  Filled 2015-10-05 (×10): qty 1

## 2015-10-05 NOTE — Code Documentation (Signed)
81[facility]  Patient from independent living facility where he lives with his wife.  He was working with OT when he had sudden onset left sided weakness at 1315.  Code stroke activated by EMS.  Stroke team at the bedside on arrival.  Labs drawn and patient cleared by Dr. Rubin PayorPickering.  Patient to CT with ED RN and stroke team. CT completed and patient back to Trauma A.  2nd PIV started.  BP within tPA parameters. NIHSS 5, see documentation for details and code stroke times.  Patient with left arm drift, left sided ataxia and neglect.  Dr. Amada JupiterKirkpatrick at the bedside discussing plan of care with patient.  Decision to treat with tPA.  Pharmacy at the bedside and notified to mix tPA at 1420.  tPA delivered at 1431.  8mg  tPA bolus given at 1433 followed by 71mg /hr for a total of 79mg  per pharmacy dosing.  Patient's son to the bedside and updated on plan of care. Patient monitored frequently per post-tPA protocol.  No available beds on unit 16M, patient to be admitted to ICU.  Bedside handoff with ED RN Romeo AppleBen.

## 2015-10-05 NOTE — Progress Notes (Signed)
Pt remains to be agitated, attempting to crawl out of bed and pulling at lines and leads. Neuro assessment remains unremarkable.Pt placed on sitter list. On call neurology notified. New orders received and carried out. Will continue to monitor. Herma ArdMesser, Brandt Chaney RN BSN.

## 2015-10-05 NOTE — Progress Notes (Signed)
Pt became very agitated and restless, and began swinging and head butting staff. Mittens applied which seemed to increase agitation, and were removed. Patient became even more agitated and began trying to get out of bed, and remove lines and leads. Pt become increasingly agitated when blood pressure is taken. Neuro exam remains unremarkable.  On call neurology notified with new orders received. Orders carried out. Will continue to monitor.Herma ArdMesser, Caley Ciaramitaro RN BSN

## 2015-10-05 NOTE — ED Notes (Signed)
[facility] code stroke, left sided weakness while with OT, CBG 143, VSS, to CT on arrival with stroke team

## 2015-10-05 NOTE — H&P (Signed)
Referring Physician: Rubin PayorPickering    Chief Complaint: Code stroke  HPI:                                                                                                                                         [name] is an 81 y.o. male male presenting to [facility] as a code stroke after he was working with OT at the independent living and he was noted to have left sided flaccidity.   EMS was called. He had significant improvement in left sided symptoms, but he continued to have left arm weakness and seemed not to have control of his left arm.  Code stroke was called.  On arrival to ED patient kept his eyes closed, was neglecting his left side.  CT head was negative for bleed. tPA was given.   Date last known well: Date: 10/05/2015 Time last known well: Time: 13:15 tPA Given: Yes     Past Medical History  Diagnosis Date  . A-fib (HCC)     paroxysmal, s/p TEE-DCCV 03/2013;  was on coumadin , pt desired to stop it , no longer a candidate per cards (hemorrhoidal bleeding; falls)  . CAD (coronary artery disease)     a. MI angioplasty 93, b. s/p CABG 01/2006;   c.  NSTEMI 06/2010 - LHC:  L-LAD ok, S-Dx ok, S-OM ok, S-PDA ok, small OM sub total occluded - Med Rx  . Chronic diastolic heart failure (HCC)   . Hyperlipidemia   . Hypertension   . Spinal stenosis      has seen Dr Ethelene Halamos before, s/p local injection which helped x a while   . GERD (gastroesophageal reflux disease)   . RLS (restless legs syndrome)   . Insomnia   . History of BPH     TURP '96 at [facility] 96, urethoplast 97 ( History of bladder outlet obstruction, chronic incontinence)  . B12 deficiency anemia   . Myocardial infarction (HCC) 01/1992; 06/2010  . Arthritis   . Hx of cardiovascular stress test     a.  Myoview (10/2005):  EF 62%, mild apical ischemia  . Hx of echocardiogram     a.  Echo (03/23/13):  mod LVH, EF 50-55%, no WMA, mild MR, mod LAE, mild RVE, mod RAE, mod TR    Past Surgical History  Procedure Laterality Date  .  Coronary artery bypass graft      "CABG X4" (05/14/2013)  . Cystoscopy with urethral dilatation  1997    "fixed strictures from TURP" (05/14/2013)  . Inguinal hernia repair Right   . Carpal tunnel release Bilateral ~ 2005    Dr Teressa SenterSypher  . Pacemaker insertion  12/2006    MDT dual chamber PPM implanted for SSS  . Tee without cardioversion N/A 03/27/2013    Procedure: TRANSESOPHAGEAL ECHOCARDIOGRAM (TEE);  Surgeon: Lewayne BuntingBrian S Crenshaw, MD;  [facility];  Service: Cardiovascular;  Laterality: N/A;  . Cardioversion N/A 03/27/2013    Procedure: CARDIOVERSION;  Surgeon: Lewayne Bunting, MD;  Location: [facility] ENDOSCOPY;  Service: Cardiovascular;  Laterality: N/A;  . Total knee arthroplasty Bilateral     right 2005, Left 2006.  Dr  Despina Hick  . Coronary angioplasty      "Dr. Riley Kill"  . Cataract extraction w/ intraocular lens  implant, bilateral Bilateral 2012  . Transurethral resection of prostate  1996  . Ep implantable device N/A 06/24/2015    Procedure:  PPM Generator Changeout;  Surgeon: [facility] Salvia, MD;  Location: [facility] INVASIVE CV LAB;  Service: Cardiovascular;  Laterality: N/A;  . Ep implantable device N/A 06/27/2015    Procedure: PPM Generator Changeout;  Surgeon: [facility] Salvia, MD;  Location: [facility] INVASIVE CV LAB;  Service: Cardiovascular;  Laterality: N/A;    Family History  Problem Relation Age of Onset  . Heart disease Neg Hx   . Diabetes Neg Hx   . Stroke Mother   . Cancer Father    Social History:  reports that he quit smoking about 56 years ago. His smoking use included Cigarettes. He has a 30 pack-year smoking history. He has never used smokeless tobacco. He reports that he does not drink alcohol or use illicit drugs.  Allergies:  Allergies  Allergen Reactions  . Amlodipine Other (See Comments)    Feet swelled  . Atorvastatin Other (See Comments)     leg pain  . Zinc Other (See Comments)    Scaly rash    Medications:                                                                                                                            Current Facility-Administered Medications  Medication Dose Route Frequency Provider Last Rate Last Dose  . alteplase (ACTIVASE) 1 mg/mL infusion 79 mg  0.9 mg/kg Intravenous Once Benjiman Core, MD       Current Outpatient Prescriptions  Medication Sig Dispense Refill  . acetaminophen (TYLENOL) 500 MG tablet Take 500 mg by mouth every 8 (eight) hours as needed for moderate pain (pain).     . B Complex-C (B-COMPLEX WITH VITAMIN C) tablet Take 1 tablet by mouth daily.    . beta carotene w/minerals (OCUVITE) tablet Take 1 tablet by mouth daily.    . carvedilol (COREG) 6.25 MG tablet Take 1 tablet (6.25 mg total) by mouth 2 (two) times daily with a meal. 60 tablet 6  . potassium chloride (MICRO-K) 10 MEQ CR capsule TAKE ONE CAPSULE BY MOUTH DAILY ALTERNATING WITH TWO CAPSULES BY MOUTH DAILY 140 capsule 3  . pramipexole (MIRAPEX) 0.125 MG tablet Take 1 tablet (0.125 mg total) by mouth 3 (three) times daily. 90 tablet 1  . torsemide (DEMADEX) 20 MG tablet TAKE 1 TABLET (20 MG TOTAL) BY MOUTH DAILY. 30 tablet 3     ROS:  History obtained from the patient  General ROS: negative for - chills, fatigue, fever, night sweats, weight gain or weight loss Psychological ROS: negative for - behavioral disorder, hallucinations, memory difficulties, mood swings or suicidal ideation Ophthalmic ROS: negative for - blurry vision, double vision, eye pain or loss of vision ENT ROS: negative for - epistaxis, nasal discharge, oral lesions, sore throat, tinnitus or vertigo Allergy and Immunology ROS: negative for - hives or itchy/watery eyes Hematological and Lymphatic ROS: negative for - bleeding problems, bruising or swollen lymph nodes Endocrine ROS: negative for - galactorrhea, hair pattern changes,  polydipsia/polyuria or temperature intolerance Respiratory ROS: negative for - cough, hemoptysis, shortness of breath or wheezing Cardiovascular ROS: negative for - chest pain, dyspnea on exertion, edema or irregular heartbeat Gastrointestinal ROS: negative for - abdominal pain, diarrhea, hematemesis, nausea/vomiting or stool incontinence Genito-Urinary ROS: negative for - dysuria, hematuria, incontinence or urinary frequency/urgency Musculoskeletal ROS: negative for - joint swelling or muscular weakness Neurological ROS: as noted in HPI Dermatological ROS: negative for rash and skin lesion changes  Neurologic Examination:                                                                                                      Weight 87.5 kg (192 lb 14.4 oz).  HEENT-  Normocephalic, no lesions, without obvious abnormality.  Normal external eye and conjunctiva.  Normal TM's bilaterally.  Normal auditory canals and external ears. Normal external nose, mucus membranes and septum.  Normal pharynx. Cardiovascular- S1, S2 normal, pulses palpable throughout   Lungs- chest clear, no wheezing, rales, normal symmetric air entry Abdomen- normal findings: bowel sounds normal Extremities- no edema Lymph-no adenopathy palpable Musculoskeletal-no joint tenderness, deformity or swelling Skin-warm and dry, no hyperpigmentation, vitiligo, or suspicious lesions  Neurological Examination Mental Status: Alert, oriented, thought content appropriate.  Speech fluent without evidence of aphasia.  Able to follow 3 step commands without difficulty. Cranial Nerves: II: Discs flat bilaterally; Visual fields grossly normal, pupils equal, round, reactive to light and accommodation III,IV, VI: ptosis not present, extra-ocular motions intact bilaterally V,VII: smile symmetric, facial light touch sensation normal bilaterally VIII: hearing normal bilaterally IX,X: uvula rises symmetrically XI: bilateral shoulder shrug XII:  midline tongue extension Motor: Right : Upper extremity   5/5    Left:     Upper extremity   4/5  Lower extremity   5/5     Lower extremity   5/5 --left arm drift Tone and bulk:normal tone throughout; no atrophy noted Sensory: neglecting his left side, appears to have some decreased sensation as well.  Deep Tendon Reflexes: 2+ and symmetric throughout UE no KJ or AJ Plantars: Right: downgoing   Left: downgoing Cerebellar: Dysmetria on left finger-to-nose, dysmetria on left normal heel-to-shin test Gait: not tested       Lab Results: Basic Metabolic Panel:  Recent Labs Lab 10/05/15 1410  NA 143  K 4.1  CL 102  GLUCOSE 148*  BUN 28*  CREATININE 1.40*    Liver Function Tests: No results for input(s): AST, ALT, ALKPHOS, BILITOT, PROT, ALBUMIN in the last  168 hours. No results for input(s): LIPASE, AMYLASE in the last 168 hours. No results for input(s): AMMONIA in the last 168 hours.  CBC:  Recent Labs Lab 10/05/15 1402 10/05/15 1410  WBC 7.1  --   NEUTROABS 5.0  --   HGB 11.1* 12.2*  HCT 34.0* 36.0*  MCV 93.2  --   PLT 250  --     Cardiac Enzymes: No results for input(s): CKTOTAL, CKMB, CKMBINDEX, TROPONINI in the last 168 hours.  Lipid Panel: No results for input(s): CHOL, TRIG, HDL, CHOLHDL, VLDL, LDLCALC in the last 168 hours.  CBG: No results for input(s): GLUCAP in the last 168 hours.  Microbiology: Results for orders placed or performed during the hospital encounter of 06/24/15  Surgical pcr screen     Status: None   Collection Time: 06/24/15 10:20 AM  Result Value Ref Range Status   MRSA, PCR NEGATIVE NEGATIVE Final   Staphylococcus aureus NEGATIVE NEGATIVE Final    Comment:        The Xpert SA Assay (FDA approved for NASAL specimens in patients over 86 years of age), is one component of a comprehensive surveillance program.  Test performance has been validated by [facility] for patients greater than or equal to 71 year old. It is not  intended to diagnose infection nor to guide or monitor treatment.     Coagulation Studies: No results for input(s): LABPROT, INR in the last 72 hours.  Imaging: No results found.   Assessment and plan discussed with with attending physician and they are in agreement.    Felicie Morn PA-C [facility] [phone]  10/05/2015, 2:21 PM   Assessment: 81 y.o. male with new onset neglect and arm weakness in the setting of afib without anticoagulation. NIH 5, he is being given IV tpa. He was not on anticoagulation due to hemorrhoidal bleeding and falls.    Stroke Risk Factors - atrial fibrillation, hyperlipidemia and hypertension  1. HgbA1c, fasting lipid panel 2. MRI, MRA  of the brain without contrast 3. Frequent neuro checks 4. Echocardiogram 5. Carotid dopplers 6. Prophylactic therapy-none for 24 hours 7. Risk factor modification 8. Telemetry monitoring 9. PT consult, OT consult, Speech consult 10. Continue carvedilol for rate control.   Ritta Slot, MD [facility] [phone]  If 7pm- 7am, please page neurology on call as listed in AMION.

## 2015-10-05 NOTE — ED Provider Notes (Signed)
CSN: [id number]     Arrival date & time 10/05/15  1400 History   First MD Initiated Contact with Patient 10/05/15 1419     Chief Complaint  Patient presents with  . Code Stroke    '@EDPCLEARED'$ @ (Consider location/radiation/quality/duration/timing/severity/associated sxs/prior Treatment) The history is provided by the patient.   patient came in as a code stroke. Met initially at the [facility] by myself. Last normal at 1:15 when he was getting physical therapy. Then from the therapist developed difficulty moving his left side. Had initial flaccidity on the left side may be on the right lower extremity also. Patient now will move the left side but appears to have some neglect. Previous history of atrial fibrillation not on anticoagulation due to hemorrhoids and falls. No recent fall. No chest pain. No headache. No confusion.  Past Medical History  Diagnosis Date  . A-fib ([place])     paroxysmal, s/p TEE-DCCV 03/2013;  was on coumadin , pt desired to stop it , no longer a candidate per cards (hemorrhoidal bleeding; falls)  . CAD (coronary artery disease)     a. MI angioplasty 93, b. s/p CABG 01/2006;   c.  NSTEMI 06/2010 - LHC:  L-LAD ok, S-Dx ok, S-OM ok, S-PDA ok, small OM sub total occluded - Med Rx  . Chronic diastolic heart failure ([place])   . Hyperlipidemia   . Hypertension   . Spinal stenosis      has seen Dr Nelva Bush before, s/p local injection which helped x a while   . GERD (gastroesophageal reflux disease)   . RLS (restless legs syndrome)   . Insomnia   . History of BPH     TURP '96 at [facility] 96, urethoplast 97 ( History of bladder outlet obstruction, chronic incontinence)  . B12 deficiency anemia   . Myocardial infarction ([place]) 01/1992; 06/2010  . Arthritis   . Hx of cardiovascular stress test     a.  Myoview (10/2005):  EF 62%, mild apical ischemia  . Hx of echocardiogram     a.  Echo (03/23/13):  mod LVH, EF 50-55%, no WMA, mild MR, mod LAE, mild RVE, mod RAE, mod TR   Past Surgical  History  Procedure Laterality Date  . Coronary artery bypass graft      "CABG X4" (05/14/2013)  . Cystoscopy with urethral dilatation  1997    "fixed strictures from TURP" (05/14/2013)  . Inguinal hernia repair Right   . Carpal tunnel release Bilateral ~ 2005    Dr Daylene Katayama  . Pacemaker insertion  12/2006    MDT dual chamber PPM implanted for SSS  . Tee without cardioversion N/A 03/27/2013    Procedure: TRANSESOPHAGEAL ECHOCARDIOGRAM (TEE);  Surgeon: Lelon Perla, MD;  Location: [facility];  Service: Cardiovascular;  Laterality: N/A;  . Cardioversion N/A 03/27/2013    Procedure: CARDIOVERSION;  Surgeon: Lelon Perla, MD;  Location: [facility];  Service: Cardiovascular;  Laterality: N/A;  . Total knee arthroplasty Bilateral     right 2005, Left 2006.  Dr  Maureen Ralphs  . Coronary angioplasty      "Dr. Lia Foyer"  . Cataract extraction w/ intraocular lens  implant, bilateral Bilateral 2012  . Transurethral resection of prostate  1996  . Ep implantable device N/A 06/24/2015    Procedure:  PPM Generator Changeout;  Surgeon: Deboraha Sprang, MD;  Location: [place] [facility] CV LAB;  Service: Cardiovascular;  Laterality: N/A;  . Ep implantable device N/A 06/27/2015    Procedure: PPM Generator Changeout;  Surgeon: Deboraha Sprang, MD;  Location: [place] CV LAB;  Service: Cardiovascular;  Laterality: N/A;   Family History  Problem Relation Age of Onset  . Heart disease Neg Hx   . Diabetes Neg Hx   . Stroke Mother   . Cancer Father    Social History  Substance Use Topics  . Smoking status: Former Smoker -- 2.00 packs/day for 15 years    Types: Cigarettes    Quit date: 02/01/1959  . Smokeless tobacco: Never Used  . Alcohol Use: No    Review of Systems  Constitutional: Negative for appetite change.  Respiratory: Negative for shortness of breath.   Cardiovascular: Negative for chest pain.  Gastrointestinal: Negative for blood in stool and rectal pain.       Patient states he had a slight  amount of blood occasionally with wiping.  Genitourinary: Negative for flank pain.  Neurological: Positive for weakness.      Allergies  Amlodipine; Atorvastatin; and Zinc  Home Medications   Prior to Admission medications   Medication Sig Start Date End Date Taking? Authorizing Provider  acetaminophen (TYLENOL) 500 MG tablet Take 500 mg by mouth every 8 (eight) hours as needed for moderate pain (pain).    Yes Historical Provider, MD  B Complex-C (B-COMPLEX WITH VITAMIN C) tablet Take 1 tablet by mouth daily.   Yes Historical Provider, MD  beta carotene w/minerals (OCUVITE) tablet Take 1 tablet by mouth daily.   Yes Historical Provider, MD  carvedilol (COREG) 6.25 MG tablet Take 1 tablet (6.25 mg total) by mouth 2 (two) times daily with a meal. 04/12/15  Yes Colon Branch, MD  potassium chloride (MICRO-K) 10 MEQ CR capsule TAKE ONE CAPSULE BY MOUTH DAILY ALTERNATING WITH TWO CAPSULES BY MOUTH DAILY 09/23/15  Yes Sherren Mocha, MD  pramipexole (MIRAPEX) 0.125 MG tablet Take 1 tablet (0.125 mg total) by mouth 3 (three) times daily. 06/24/15  Yes Dayna N Dunn, PA-C  torsemide (DEMADEX) 20 MG tablet TAKE 1 TABLET (20 MG TOTAL) BY MOUTH DAILY. Patient taking differently: TAKE 0.5 TAB ($Remove'10MG'phigHFl$  TOTAL) BY MOUTH DAILY. 08/17/15  Yes Deboraha Sprang, MD   BP 152/78 mmHg  Pulse 85  Temp(Src) 97.6 F (36.4 C) (Oral)  Resp 30  Ht $R'5\' 10"'PQ$  (1.778 m)  Wt 194 lb 0.1 oz (88 kg)  BMI 27.84 kg/m2  SpO2 100% Physical Exam  Constitutional: He appears well-developed.  HENT:  Head: Atraumatic.  Eyes: Pupils are equal, round, and reactive to light.  Cardiovascular: Normal rate.   Pulmonary/Chest: Effort normal.  Pacemaker left chest wall.  Abdominal: Soft.  Musculoskeletal: Normal range of motion.  Neurological: He is alert.  Complete NIH scoring done by neurology. Does have apparent left-sided neglect. Will move all extremities somewhat.    Skin: Skin is warm.    ED Course  Procedures (including  critical care time) Labs Review Labs Reviewed  APTT - Abnormal; Notable for the following:    aPTT 39 (*)    All other components within normal limits  CBC - Abnormal; Notable for the following:    RBC 3.65 (*)    Hemoglobin 11.1 (*)    HCT 34.0 (*)    All other components within normal limits  COMPREHENSIVE METABOLIC PANEL - Abnormal; Notable for the following:    Glucose, Bld 147 (*)    BUN 25 (*)    Creatinine, Ser 1.34 (*)    Albumin 3.3 (*)    AST 95 (*)  ALT 75 (*)    Alkaline Phosphatase 343 (*)    GFR calc non Af Amer 45 (*)    GFR calc Af Amer 52 (*)    All other components within normal limits  CBG MONITORING, ED - Abnormal; Notable for the following:    Glucose-Capillary 121 (*)    All other components within normal limits  I-STAT CHEM 8, ED - Abnormal; Notable for the following:    BUN 28 (*)    Creatinine, Ser 1.40 (*)    Glucose, Bld 148 (*)    Calcium, Ion 1.08 (*)    Hemoglobin 12.2 (*)    HCT 36.0 (*)    All other components within normal limits  MRSA PCR SCREENING  PROTIME-INR  DIFFERENTIAL  I-STAT TROPOININ, ED    Imaging Review Ct Head Wo Contrast  10/05/2015  CLINICAL DATA:  Acute onset left-sided weakness EXAM: CT HEAD WITHOUT CONTRAST TECHNIQUE: Contiguous axial images were obtained from the base of the skull through the vertex without intravenous contrast. COMPARISON:  October 20, 2014 FINDINGS: Moderate diffuse atrophy is again noted. There is no intracranial mass, hemorrhage, extra-axial fluid collection, or midline shift. There is small vessel disease throughout the centra semiovale bilaterally, stable. There is evidence of a prior infarct in the mid right frontal lobe with gray-white junction. There is also evidence of prior infarct at the right frontal-parietal junction superiorly. A small infarct in the superior most aspect of the left parietal lobe is stable as well. No new gray-white compartment lesion is identified. No acute infarct is  evident. The middle cerebral arteries show normal attenuation bilaterally. The bony calvarium appears intact.  The mastoid air cells are clear. IMPRESSION: Atrophy with diffuse small vessel disease in the supratentorial white matter is stable. Prior infarcts remains stable as noted above. No acute infarct evident. No hemorrhage or mass effect. Critical Value/emergent results were called by telephone at the time of interpretation on 10/05/2015 at 2:18 pm to Dr. Roland Rack, neurology , who verbally acknowledged these results. Electronically Signed   By: Lowella Grip III M.D.   On: 10/05/2015 14:20   I have personally reviewed and evaluated these images and lab results as part of my medical decision-making.   EKG Interpretation   Date/Time:  Wednesday October 05 2015 14:18:09 EDT Ventricular Rate:  80 PR Interval:  237 QRS Duration: 150 QT Interval:  412 QTC Calculation: 475 R Axis:   -37 Text Interpretation:  Sinus or ectopic atrial rhythm Prolonged PR interval  Right bundle branch block LVH with IVCD and secondary repol abnrm  Borderline prolonged QT interval Confirmed by Alvino Chapel  MD, Ovid Curd  [phone]) on 10/05/2015 2:23:09 PM      MDM   Final diagnoses:  Stroke, acute, embolic ([place])  CVA (cerebral infarction)  CVA (cerebral infarction)    Patient came in as a code stroke. Has apparent stroke. Negative head CT. Seen immediately by neurology and myself. Appears to be a somewhat severe stroke. Will give TPA. Admit to ICU  CRITICAL CARE Performed by: Mackie Pai Total critical care time: 30 minutes Critical care time was exclusive of separately billable procedures and treating other patients. Critical care was necessary to treat or prevent imminent or life-threatening deterioration. Critical care was time spent personally by me on the following activities: development of treatment plan with patient and/or surrogate as well as nursing, discussions with consultants,  evaluation of patient's response to treatment, examination of patient, obtaining history from patient or surrogate, ordering and  performing treatments and interventions, ordering and review of laboratory studies, ordering and review of radiographic studies, pulse oximetry and re-evaluation of patient's condition.     Davonna Belling, MD 10/05/15 [phone]

## 2015-10-05 NOTE — Progress Notes (Signed)
Dr. Roseanne RenoStewart notified that patient is complaining of pain to bilateral lower extremities, is not tolerating SCDs, and is becoming anxious and agitated. Orders given for pain medication. Will continue to monitor patient.

## 2015-10-05 NOTE — Progress Notes (Signed)
  Echocardiogram 2D Echocardiogram has been performed.  Tye SavoyCasey N [place]wood 10/05/2015, 4:49 PM

## 2015-10-06 ENCOUNTER — Telehealth: Payer: Self-pay | Admitting: Internal Medicine

## 2015-10-06 ENCOUNTER — Encounter: Payer: Medicare Other | Admitting: *Deleted

## 2015-10-06 ENCOUNTER — Inpatient Hospital Stay (HOSPITAL_COMMUNITY): Payer: Medicare Other

## 2015-10-06 ENCOUNTER — Telehealth: Payer: Self-pay | Admitting: Cardiology

## 2015-10-06 ENCOUNTER — Ambulatory Visit (HOSPITAL_COMMUNITY): Payer: Medicare Other

## 2015-10-06 DIAGNOSIS — I63131 Cerebral infarction due to embolism of right carotid artery: Secondary | ICD-10-CM

## 2015-10-06 LAB — LIPID PANEL
Cholesterol: 145 mg/dL (ref 0–200)
HDL: 29 mg/dL — AB (ref >40)
LDL CALC: 101 mg/dL — AB (ref 0–99)
TRIGLYCERIDES: 75 mg/dL (ref <150)
Total CHOL/HDL Ratio: 5 RATIO
VLDL: 15 mg/dL (ref 0–40)

## 2015-10-06 LAB — GLUCOSE, CAPILLARY
GLUCOSE-CAPILLARY: 92 mg/dL (ref 65–99)
GLUCOSE-CAPILLARY: 73 mg/dL (ref 65–99)
GLUCOSE-CAPILLARY: 96 mg/dL (ref 65–99)

## 2015-10-06 MED ORDER — CLOPIDOGREL BISULFATE 75 MG PO TABS
75.0000 mg | ORAL_TABLET | Freq: Every day | ORAL | Status: DC
  Administered 2015-10-07 – 2015-10-13 (×7): 75 mg via ORAL
  Filled 2015-10-06 (×7): qty 1

## 2015-10-06 MED ORDER — ASPIRIN EC 81 MG PO TBEC
81.0000 mg | DELAYED_RELEASE_TABLET | Freq: Every day | ORAL | Status: DC
  Administered 2015-10-07 – 2015-10-13 (×7): 81 mg via ORAL
  Filled 2015-10-06 (×7): qty 1

## 2015-10-06 MED ORDER — ENSURE ENLIVE PO LIQD
237.0000 mL | Freq: Two times a day (BID) | ORAL | Status: DC
  Administered 2015-10-07 – 2015-10-13 (×9): 237 mL via ORAL
  Filled 2015-10-06 (×14): qty 237

## 2015-10-06 NOTE — Telephone Encounter (Signed)
New Message    [name] calling stating that pt had a stroke and is in the hospital and will not be able to send remote transmission today.

## 2015-10-06 NOTE — Telephone Encounter (Signed)
Confirmed remote transmission w/ pt wife. Pt will send when he gets home from hospital.

## 2015-10-06 NOTE — Progress Notes (Signed)
*  PRELIMINARY RESULTS* Vascular Ultrasound Carotid Duplex (Doppler) has been completed.  Preliminary findings: Right = 40-59% ICA stenosis. Left = 1-39% ICA stenosis. Right vertebral not visualized. Left vertebral antegrade flow.  Farrel DemarkJill Eunice, RDMS, RVT  10/06/2015, 12:41 PM

## 2015-10-06 NOTE — Care Management Note (Signed)
Case Management Note  Patient Details  Name: [name] MRN: [id number] Date of Birth: Feb 01, 1921  Subjective/Objective:   Pt admitted on 10/05/15 with CVA.  PTA, [facility].                   Action/Plan: PT/OT consults pending.  Pt will likely need SNF for rehab at dc.  Will consult CSW to follow for possible SNF at dc.    Expected Discharge Date:                  Expected Discharge Plan:     In-House Referral:   Clinical Social Worker  Discharge planning Services   CM referral   Post Acute Care Choice:    Choice offered to:     DME Arranged:    DME Agency:     HH Arranged:    HH Agency:     Status of Service:   In process, will continue to follow  Medicare Important Message Given:    Date Medicare IM Given:    Medicare IM give by:    Date Additional Medicare IM Given:    Additional Medicare Important Message give by:     If discussed at Long Length of Stay Meetings, dates discussed:    Additional Comments:  Quintella BatonJulie W. Tyishia Aune, RN, BSN  Trauma/Neuro ICU Case Manager [phone]

## 2015-10-06 NOTE — Progress Notes (Signed)
Intermittent periods of confusion. Patient unable to stand without at least 2 people assist. Inattention to left increased after sitting up in chair for one hour. Attempts to reorient patient and strict observation .

## 2015-10-06 NOTE — Telephone Encounter (Signed)
Pt wife aware that pt can send remote transmission when he gets home from the hospital. Refer to other phone note from 10-06-15

## 2015-10-06 NOTE — Progress Notes (Signed)
STROKE TEAM PROGRESS NOTE   HISTORY [name] is a 81 y.o. male w/ PMHx of PAF (not on anticoagulation, d/t hemorrhoids and multiple falls), CAD s/p CABG, chronic dCHF, HTN, HLD, and GERD, presented from independent living while working with OT, found to have left-sided flaccidity. Patient had improvement of left-sided symptoms but continued to have left arm weakness and mild left-sided neglect. Called as Code Stroke, CT head negative for bleed, patient given TPa.   He was admitted to the neuro ICU for further evaluation and treatment.   SUBJECTIVE (INTERVAL HISTORY) Patient somewhat disoriented and mildly confused but answering most questions appropriately, following commands. Agitated overnight, attempting to crawl out of bed, pulling at lines.    OBJECTIVE Temp:  [97.6 F (36.4 C)-98.4 F (36.9 C)] 98.4 F (36.9 C) (11/02 2000) Pulse Rate:  [25-97] 76 (11/03 0500) Cardiac Rhythm:  [-] Atrial fibrillation (11/03 0400) Resp:  [14-33] 28 (11/03 0500) BP: (97-194)/(38-162) 130/72 mmHg (11/03 0500) SpO2:  [86 %-100 %] 100 % (11/03 0500) Weight:  [192 lb 14.4 oz (87.5 kg)-194 lb 0.1 oz (88 kg)] 194 lb 0.1 oz (88 kg) (11/02 1539)  CBC:  Recent Labs Lab 10/05/15 1402 10/05/15 1410  WBC 7.1  --   NEUTROABS 5.0  --   HGB 11.1* 12.2*  HCT 34.0* 36.0*  MCV 93.2  --   PLT 250  --     Basic Metabolic Panel:  Recent Labs Lab 10/05/15 1402 10/05/15 1410  NA 141 143  K 4.2 4.1  CL 104 102  CO2 26  --   GLUCOSE 147* 148*  BUN 25* 28*  CREATININE 1.34* 1.40*  CALCIUM 8.9  --     Lipid Panel:    Component Value Date/Time   CHOL 145 10/06/2015 0344   TRIG 75 10/06/2015 0344   HDL 29* 10/06/2015 0344   CHOLHDL 5.0 10/06/2015 0344   VLDL 15 10/06/2015 0344   LDLCALC 101* 10/06/2015 0344   HgbA1c:  Lab Results  Component Value Date   HGBA1C 5.7 07/15/2014    IMAGING  Ct Head Wo Contrast  10/05/2015  CLINICAL DATA:  Acute onset left-sided weakness EXAM: CT  HEAD WITHOUT CONTRAST TECHNIQUE: Contiguous axial images were obtained from the base of the skull through the vertex without intravenous contrast. COMPARISON:  October 20, 2014 FINDINGS: Moderate diffuse atrophy is again noted. There is no intracranial mass, hemorrhage, extra-axial fluid collection, or midline shift. There is small vessel disease throughout the centra semiovale bilaterally, stable. There is evidence of a prior infarct in the mid right frontal lobe with gray-white junction. There is also evidence of prior infarct at the right frontal-parietal junction superiorly. A small infarct in the superior most aspect of the left parietal lobe is stable as well. No new gray-white compartment lesion is identified. No acute infarct is evident. The middle cerebral arteries show normal attenuation bilaterally. The bony calvarium appears intact.  The mastoid air cells are clear. IMPRESSION: Atrophy with diffuse small vessel disease in the supratentorial white matter is stable. Prior infarcts remains stable as noted above. No acute infarct evident. No hemorrhage or mass effect. Critical Value/emergent results were called by telephone at the time of interpretation on 10/05/2015 at 2:18 pm to Dr. Ritta SlotMcNeill Kirkpatrick, neurology , who verbally acknowledged these results. Electronically Signed   By: Bretta BangWilliam  Woodruff III M.D.   On: 10/05/2015 14:20    PHYSICAL EXAM   General: Alert, cooperative, NAD. Disoriented.  HEENT: PERRL, EOMI. Moist mucus membranes  Neck: Full range of motion without pain, supple, no lymphadenopathy or carotid bruits Lungs: Clear to ascultation bilaterally, normal work of respiration, no wheezes, rales, rhonchi Heart: RRR, no murmurs, gallops, or rubs Abdomen: Soft, non-tender, non-distended, BS + Extremities: No cyanosis, clubbing, or edema. Chronic venous stasis changes.   Mental Status -  Level of arousal intact, only oriented to person. Language including expression, naming,  repetition, comprehension was assessed and found intact. Attention span and concentration were normal. Recent and remote memory impaired. Fund of Knowledge mildly impaired.   Cranial Nerves II - XII - II - Visual field intact. III, IV, VI - Extraocular movements intact. V - Facial sensation intact bilaterally. VII - Facial movement intact bilaterally. VIII - Hearing slightly impaired bilaterally. X - Palate elevates symmetrically. XI - Chin turning & shoulder shrug intact bilaterally. XII - Tongue protrusion intact.  Motor Strength - The patient's strength was 4/5 in LUE and left grip is weak, otherwise normal in all extremities, pronator drift was absent.  Bulk was normal and fasciculations were absent.   Motor Tone - Muscle tone was assessed at the neck and appendages and was normal.  Reflexes - The patient's reflexes were 1+ in all extremities and he had no pathological reflexes.  Sensory - Light touch, temperature/pinprick, vibration and proprioception, and Romberg testing were assessed and were symmetrical.   ASSESSMENT/PLAN [name] is a 81 y.o. male w/ PMHx of PAF (not on anticoagulation, d/t hemorrhoids and multiple falls), CAD s/p CABG, chronic dCHF, HTN, HLD, and GERD, admitted w/ acute onset left-sided weakness, likely 2/2 acute cardioembolic stroke.   Stroke:  Non-dominant suspected right cortical vs subcortical infarct thought to be embolic secondary PAF vs small vessel disease  Patient likely w/ cardioembolic etiology of stroke given h/o PAF, not on anticoagulation. H/o falls and hemorrhoids. Given quality of life and functional capacity, fell patient is good candidate for ASA +   CT head: Atrophy with diffuse small vessel disease in the supratentorial white matter is stable. Prior infarcts remains stable as noted above. No acute infarct evident. No hemorrhage or mass effect.   MRI: Not able to be performed d/t PPM  Carotid Doppler pending  2D Echo  EF  50-55%, basal midlateral akinesis, mild LVH. PAP 40 mm Hg.   LDL 101  HgbA1c pending  SCD's for VTE prophylaxis  Diet Heart Room service appropriate?: Yes; Fluid consistency:: Thin  No antithrombotic prior to admission, will start on ASA 81 mg + Plavix 75 mg daily for at least 306 months once out of 24 hour TPa window.   Patient counseled to be compliant with his antithrombotic medications  Ongoing aggressive stroke risk factor management  Therapy recommendations:  Pending  Disposition:  Pending therapy recommendations  PAF  Not on anticoagulation as stated above. Currently NSR  ASA + Plavix for at least 3-6 months  Chronic Diastolic CHF  Stable  Continue Torsemide 20 mg daily  Hypertension  Stable Permissive hypertension (OK if < 220/120) but gradually normalize in 5-7 days  Hyperlipidemia  LDL 101, goal < 70  Other Stroke Risk Factors  Advanced age  Hx stroke/TIA  Coronary artery disease  Hospital day # 78  81 year old male w/ multiple medical issues, developed left-sided flaccidity while working with OT. Some improvement in deficits prior to arrival in ED, however, still with left-sided weakness and mild neglect. Given functional status and quality of life, patient given TPa, tolerated well. Some confusion overnight, most likely delirium  in the setting of pre-existing mild cognitive impairment. Still w/ some mild left-sided weakness on exam. Given functional status as stated, feel patient is at the very least a candidate for ASA + Plavix 75 mg daily for ~6 months starting tomorrow once out of TPa window. Repeat CT scan today given TPa given last night. Out of bed today, PT, OT evaluation.    Lauris Chroman, MD PGY-3, Internal Medicine Pager: [phone]  I have personally examined this patient, reviewed notes, independently viewed imaging studies, participated in medical decision making and plan of care. I have made any additions or clarifications directly to the  above note. Agree with note above.  He presented with left hemiparesis likely due to right MCA branch infarct he has been treated with IV TPA with good recovery. He remains at risk for neurological worsening, recurrent stroke, TIA needs ongoing stroke evaluation and aggressive risk factor modification. Needs close neurological monitoring and strict blood pressure control. Family not available at bedside for discussion This patient is critically ill and at significant risk of neurological worsening, death and care requires constant monitoring of vital signs, hemodynamics,respiratory and cardiac monitoring, extensive review of multiple databases, frequent neurological assessment, discussion with family, other specialists and medical decision making of high complexity.I have made any additions or clarifications directly to the above note.This critical care time does not reflect procedure time, or teaching time or supervisory time of PA/NP/Med Resident etc but could involve care discussion time.  I spent 32 minutes of neurocritical care time  in the care of  this patient.   Delia Heady, MD Medical Director [facility] Pager: [phone] 10/06/2015 4:04 PM    To contact Stroke Continuity provider, please refer to [email or web address]. After hours, contact General Neurology

## 2015-10-06 NOTE — Evaluation (Signed)
Physical Therapy Evaluation Patient Details Name: Zannie CoveDwight L Snodgrass MRN: [id number] DOB: 15-Jun-1921 Today's Date: 10/06/2015   History of Present Illness  81 y.o. male w/ PMHx of PAF (not on anticoagulation, d/t hemorrhoids and multiple falls), CAD s/p CABG, chronic dCHF, HTN, HLD, and GERD, presented from independent living while working with OT, found to have left-sided flaccidity. Patient had improvement of left-sided symptoms but continued to have left arm weakness and mild left-sided neglect. Called as Code Stroke, CT head negative for bleed, patient given TPa.   Clinical Impression  Pt labile at times during session and needs frequent re-directing back to task.  Pt with strong posterior lean in standing and drifts posteriorly in sitting needing cues to attend to balance.  Unclear pt's true baseline level of function, but [facility].  At this time pt will need SNF level of care at D/C.      Follow Up Recommendations SNF    Equipment Recommendations  None recommended by PT    Recommendations for Other Services       Precautions / Restrictions Precautions Precautions: Fall Restrictions Weight Bearing Restrictions: No      Mobility  Bed Mobility Overal bed mobility: +2 for physical assistance;Needs Assistance Bed Mobility: Supine to Sit;Sit to Supine     Supine to sit: Mod assist;+2 for physical assistance Sit to supine: Mod assist;+2 for physical assistance      Transfers Overall transfer level: Needs assistance Equipment used: Rolling walker (2 wheeled) Transfers: Sit to/from Stand Sit to Stand: +2 physical assistance;Max assist         General transfer comment: significant posterior  and L bias.  When facilitating standing in midline, pt indicates he feels like he is leaning to R side.    Ambulation/Gait                Stairs            Wheelchair Mobility    Modified Rankin (Stroke Patients  Only) Modified Rankin (Stroke Patients Only) Pre-Morbid Rankin Score: Moderately severe disability Modified Rankin: Severe disability     Balance Overall balance assessment: Needs assistance Sitting-balance support: Bilateral upper extremity supported;Feet supported Sitting balance-Leahy Scale: Poor Sitting balance - Comments: able to sit midline, but affected by poor attention and leans posteriorly Postural control: Posterior lean Standing balance support: During functional activity Standing balance-Leahy Scale: Zero Standing balance comment: posteiror/R bias                             Pertinent Vitals/Pain Pain Assessment: Faces Faces Pain Scale: No hurt    Home Living Family/patient expects to be discharged to:: Skilled nursing facility     Type of Home: Independent living facility                Prior Function Level of Independence: Independent with assistive device(s)         Comments: Used RW. States independent with bating/dressing. States a nurse rolled him to the dining room for the last 2 days PTA. unsure of accuracy.     Hand Dominance   Dominant Hand: Right    Extremity/Trunk Assessment   Upper Extremity Assessment: Defer to OT evaluation       LUE Deficits / Details: "distal strength 4/5. generalized weakness. "alien arm" symptoms. Pt states his "arm doesn't do what I want it to do ". Attempting to use functionally. poor monitoring  of controlled movement patterns. Pt stes he "poked himself in the eye" with his L hand due to poor control.   Lower Extremity Assessment: LLE deficits/detail   LLE Deficits / Details: Grossly weak at 3 - 4-/5.  Difficult to fully assess with pt cognition.  pt appears to have diminished sensation, but not absent.    Cervical / Trunk Assessment: Kyphotic  Communication   Communication: HOH  Cognition Arousal/Alertness: Awake/alert Behavior During Therapy: Restless;Impulsive (Labile) Overall Cognitive  Status: Impaired/Different from baseline Area of Impairment: Orientation;Attention;Memory;Following commands;Safety/judgement;Awareness;Problem solving Orientation Level: Disoriented to;Time Current Attention Level: Sustained Memory: Decreased recall of precautions;Decreased short-term memory Following Commands: Follows one step commands with increased time Safety/Judgement: Decreased awareness of safety;Decreased awareness of deficits Awareness: Intellectual Problem Solving: Slow processing;Difficulty sequencing;Requires verbal cues;Requires tactile cues General Comments: Telling account of coming to the hospital "this morning" while working with PT.     General Comments      Exercises        Assessment/Plan    PT Assessment Patient needs continued PT services  PT Diagnosis Difficulty walking   PT Problem List Decreased strength;Decreased activity tolerance;Decreased balance;Decreased mobility;Decreased coordination;Decreased cognition;Decreased knowledge of use of DME;Decreased safety awareness;Impaired sensation  PT Treatment Interventions DME instruction;Gait training;Functional mobility training;Therapeutic activities;Therapeutic exercise;Balance training;Neuromuscular re-education;Cognitive remediation;Patient/family education   PT Goals (Current goals can be found in the Care Plan section) Acute Rehab PT Goals Patient Stated Goal: to get some rehab PT Goal Formulation: With patient Time For Goal Achievement: 10/20/15 Potential to Achieve Goals: Fair    Frequency Min 3X/week   Barriers to discharge        Co-evaluation PT/OT/SLP Co-Evaluation/Treatment: Yes Reason for Co-Treatment: Complexity of the patient's impairments (multi-system involvement);For patient/therapist safety PT goals addressed during session: Mobility/safety with mobility;Balance OT goals addressed during session: ADL's and self-care;Strengthening/ROM       End of Session Equipment Utilized  During Treatment: Gait belt Activity Tolerance: Patient limited by fatigue Patient left: in bed;with call bell/phone within reach;with nursing/sitter in room Nurse Communication: Mobility status         Time: 2956-2130 PT Time Calculation (min) (ACUTE ONLY): 31 min   Charges:   PT Evaluation $Initial PT Evaluation Tier I: 1 Procedure     PT G CodesSunny Schlein, [place] [phone] 10/06/2015, 2:18 PM

## 2015-10-06 NOTE — Evaluation (Signed)
Speech Language Pathology Evaluation Patient Details Name: [name] MRN: [id number] DOB: May 28, 1921 Today's Date: 10/06/2015 Time: [id number] SLP Time Calculation (min) (ACUTE ONLY): 26 min  Problem List:  Patient Active Problem List   Diagnosis Date Noted  . CVA (cerebral infarction) 10/05/2015  . Pressure ulcer 10/05/2015  . Frequent falls 09/29/2015  . Acute renal failure (HCC) 02/14/2014  . Cellulitis and abscess of hand 02/13/2014  . A-fib (HCC)   . Dry skin dermatitis 02/08/2014  . Orthostatic hypotension 05/15/2013  . Recurrent falls 05/14/2013  . Edema of extremities 05/08/2013  . RLS (restless legs syndrome) 04/06/2013  . Gait disorder 04/06/2013  . Redness of skin, L leg  03/26/2013  . Chronotropic incompetence with sinus node dysfunction (HCC) 04/24/2012  . Coronary artery disease 03/30/2012  . Pacemaker-DDD-MDT 04/23/2011  . RECTAL BLEEDING 05/04/2010  . VENTRICULAR TACHYCARDIA 01/31/2010  . SYNCOPE 08/18/2009  . HYPERGLYCEMIA, MILD 08/18/2009  . CAD, ARTERY BYPASS GRAFT 06/24/2009  . Anemia 02/01/2009  . BACK PAIN 02/01/2009  . HYPERLIPIDEMIA 11/25/2008  . Atrial fibrillation (HCC) 11/25/2008  . Chronic diastolic heart failure (HCC) 11/25/2008  . GERD 11/25/2008  . BENIGN PROSTATIC HYPERTROPHY 11/25/2008  . INSOMNIA-SLEEP DISORDER-UNSPEC 11/25/2008  . Essential hypertension 06/07/2008  . SNORING 06/07/2008   Past Medical History:  Past Medical History  Diagnosis Date  . A-fib (HCC)     paroxysmal, s/p TEE-DCCV 03/2013;  was on coumadin , pt desired to stop it , no longer a candidate per cards (hemorrhoidal bleeding; falls)  . CAD (coronary artery disease)     a. MI angioplasty 93, b. s/p CABG 01/2006;   c.  NSTEMI 06/2010 - [facility]:  L-LAD ok, S-Dx ok, S-OM ok, S-PDA ok, small OM sub total occluded - Med Rx  . Chronic diastolic heart failure (HCC)   . Hyperlipidemia   . Hypertension   . Spinal stenosis      has seen Dr Ethelene Halamos before, s/p local  injection which helped x a while   . GERD (gastroesophageal reflux disease)   . RLS (restless legs syndrome)   . Insomnia   . History of BPH     TURP '96 at [facility] 96, urethoplast 97 ( History of bladder outlet obstruction, chronic incontinence)  . B12 deficiency anemia   . Myocardial infarction (HCC) 01/1992; 06/2010  . Arthritis   . Hx of cardiovascular stress test     a.  Myoview (10/2005):  EF 62%, mild apical ischemia  . Hx of echocardiogram     a.  Echo (03/23/13):  mod LVH, EF 50-55%, no WMA, mild MR, mod LAE, mild RVE, mod RAE, mod TR   Past Surgical History:  Past Surgical History  Procedure Laterality Date  . Coronary artery bypass graft      "CABG X4" (05/14/2013)  . Cystoscopy with urethral dilatation  1997    "fixed strictures from TURP" (05/14/2013)  . Inguinal hernia repair Right   . Carpal tunnel release Bilateral ~ 2005    Dr Teressa SenterSypher  . Pacemaker insertion  12/2006    MDT dual chamber PPM implanted for SSS  . Tee without cardioversion N/A 03/27/2013    Procedure: TRANSESOPHAGEAL ECHOCARDIOGRAM (TEE);  Surgeon: Lewayne BuntingBrian S Crenshaw, MD;  [facility];  Service: Cardiovascular;  Laterality: N/A;  . Cardioversion N/A 03/27/2013    Procedure: CARDIOVERSION;  Surgeon: Lewayne BuntingBrian S Crenshaw, MD;  [facility];  Service: Cardiovascular;  Laterality: N/A;  . Total knee arthroplasty Bilateral     right 2005,  Left 2006.  Dr  Despina Hick  . Coronary angioplasty      "Dr. Riley Kill"  . Cataract extraction w/ intraocular lens  implant, bilateral Bilateral 2012  . Transurethral resection of prostate  1996  . Ep implantable device N/A 06/24/2015    Procedure:  PPM Generator Changeout;  Surgeon: [facility] Salvia, MD;  Location: [facility] INVASIVE CV LAB;  Service: Cardiovascular;  Laterality: N/A;  . Ep implantable device N/A 06/27/2015    Procedure: PPM Generator Changeout;  Surgeon: [facility] Salvia, MD;  Location: [facility] INVASIVE CV LAB;  Service: Cardiovascular;  Laterality: N/A;   HPI:  81  year old male from independent living facility admitted with left arm weakness, given tPA. CT head indicated no acute infarct.    Assessment / Plan / Recommendation Clinical Impression  Patient presents with cognitive deficits impacting intellectual awareness, sustained attention, and problem solving which in combination with restlessness and impulsivity, impact safety and carryout of ADLs. Patient will benefit from SLP f/u to address above areas, maximizing cognitive abilities prior to d/c.     SLP Assessment  Patient needs continued Speech Lanaguage Pathology Services    Follow Up Recommendations  Other (comment) (TBD pending progress)    Frequency and Duration min 2x/week  2 weeks   Pertinent Vitals/Pain Pain Assessment: No/denies pain   SLP Goals  Potential to Achieve Goals (ACUTE ONLY): Good  SLP Evaluation Prior Functioning  Cognitive/Linguistic Baseline: Information not available Type of Home: Independent living facility  Lives With: Spouse   Cognition  Overall Cognitive Status: Impaired/Different from baseline Arousal/Alertness: Awake/alert Orientation Level: Oriented to person;Oriented to place;Oriented to situation Attention: Sustained Sustained Attention: Impaired Sustained Attention Impairment: Functional complex;Verbal complex Memory:  (TBD) Awareness: Impaired Awareness Impairment: Intellectual impairment Problem Solving: Impaired Problem Solving Impairment: Functional basic Behaviors: Impulsive;Restless Safety/Judgment: Impaired Comments: decreased safety awareness, attempting to get up on own    Comprehension  Auditory Comprehension Overall Auditory Comprehension: Appears within functional limits for tasks assessed (for basic and two step commands) Interfering Components: Attention (interferes with carryout of complex directions during tasks) Visual Recognition/Discrimination Discrimination: Within Function Limits Reading Comprehension Reading Status:  Not tested    Expression Expression Primary Mode of Expression: Verbal Verbal Expression Overall Verbal Expression: Appears within functional limits for tasks assessed Written Expression Written Expression: Not tested   Oral / Motor Oral Motor/Sensory Function Overall Oral Motor/Sensory Function: Appears within functional limits for tasks assessed Motor Speech Overall Motor Speech: Appears within functional limits for tasks assessed   GO    Ferdinand Lango MA, CCC-SLP [phone]  Breeze Berringer Meryl 10/06/2015, 11:47 AM

## 2015-10-06 NOTE — Progress Notes (Signed)
Initial Nutrition Assessment  INTERVENTION:  Ensure Enlive po BID, each supplement provides 350 kcal and 20 grams of protein  NUTRITION DIAGNOSIS:   Inadequate oral intake related to lethargy/confusion as evidenced by meal completion < 50%.  GOAL:   Patient will meet greater than or equal to 90% of their needs  MONITOR:   PO intake, Supplement acceptance, Labs, Weight trends, Skin  REASON FOR ASSESSMENT:   Malnutrition Screening Tool   ASSESSMENT:   Pt admitted on 10/05/15 with CVA. PTA, pt [facility].   Pt unable to answer questions about weight.  Nutrition-Focused physical exam completed. Findings are no fat depletion, no muscle depletion, and no edema.  Pt likes chocolate ensure, will offer.  Pt will need SNF at d/c per therapies.   Diet Order:  Diet Heart Room service appropriate?: Yes; Fluid consistency:: Thin  Skin:  Wound (see comment) (Stage I sacrum )  Last BM:  unknown  Height:   Ht Readings from Last 1 Encounters:  10/05/15 5\' 10"  (1.778 m)   Weight:   Wt Readings from Last 1 Encounters:  10/05/15 194 lb 0.1 oz (88 kg)   Ideal Body Weight:  75.4 kg  BMI:  Body mass index is 27.84 kg/(m^2).  Estimated Nutritional Needs:   Kcal:  1800-2000  Protein:  85-95 grams  Fluid:  > 1.8 L/day  EDUCATION NEEDS:   No education needs identified at this time  Kendell BaneHeather Leahna Hewson RD, LDN, CNSC [phone] Pager [phone] After Hours Pager

## 2015-10-06 NOTE — Progress Notes (Signed)
OT Cancellation Note  Patient Details Name: Zannie CoveDwight L Schriever MRN: [id number] DOB: 03/28/1921   Cancelled Treatment:    Reason Eval/Treat Not Completed: Patient not medically ready Bedrest orders, [facility],HILLARY  Alonzo Owczarzak, OTR/L  [phone] 10/06/2015 10/06/2015, 7:12 AM

## 2015-10-07 ENCOUNTER — Encounter: Payer: Self-pay | Admitting: Cardiology

## 2015-10-07 DIAGNOSIS — I63031 Cerebral infarction due to thrombosis of right carotid artery: Secondary | ICD-10-CM

## 2015-10-07 LAB — GLUCOSE, CAPILLARY
GLUCOSE-CAPILLARY: 94 mg/dL (ref 65–99)
GLUCOSE-CAPILLARY: 104 mg/dL — AB (ref 65–99)
GLUCOSE-CAPILLARY: 105 mg/dL — AB (ref 65–99)
Glucose-Capillary: 101 mg/dL — ABNORMAL HIGH (ref 65–99)

## 2015-10-07 LAB — HEMOGLOBIN A1C
Hgb A1c MFr Bld: 5.8 % — ABNORMAL HIGH (ref 4.8–5.6)
MEAN PLASMA GLUCOSE: 120 mg/dL

## 2015-10-07 MED ORDER — QUETIAPINE FUMARATE 25 MG PO TABS
12.5000 mg | ORAL_TABLET | Freq: Every day | ORAL | Status: DC
  Administered 2015-10-07 – 2015-10-12 (×6): 12.5 mg via ORAL
  Filled 2015-10-07 (×7): qty 1

## 2015-10-07 MED ORDER — DIPHENHYDRAMINE HCL 12.5 MG/5ML PO ELIX: 12.5000 mg | ORAL_SOLUTION | Freq: Four times a day (QID) | ORAL | Status: DC | PRN

## 2015-10-07 MED ORDER — PANTOPRAZOLE SODIUM 40 MG PO TBEC
40.0000 mg | DELAYED_RELEASE_TABLET | Freq: Every day | ORAL | Status: DC
  Administered 2015-10-07 – 2015-10-12 (×6): 40 mg via ORAL
  Filled 2015-10-07 (×7): qty 1

## 2015-10-07 MED ORDER — DIPHENHYDRAMINE HCL 12.5 MG/5ML PO ELIX: 12.5000 mg | ORAL_SOLUTION | Freq: Every evening | ORAL | Status: DC | PRN

## 2015-10-07 NOTE — Progress Notes (Signed)
Pt noted to become very agitated, c/o of itching and "feeling like I'm coming out of my skin." Scrotum red and appears irritated. On call MD notified. Will continue to monitor. Herma ArdMesser, Terryann Verbeek RN BSN

## 2015-10-07 NOTE — Clinical Social Work Note (Signed)
Clinical Social Work Assessment  Patient Details  Name: [name] MRN: [id number] Date of Birth: 1921-05-09  Date of referral:  10/07/15               Reason for consult:  Facility Placement                Permission sought to share information with:  Family Supports Permission granted to share information::  Yes, Verbal Permission Granted  Name::     [name]   Relationship::  son   Contact Information:      Housing/Transportation Living arrangements for the past 2 months:  Single Family Home Source of Information:  Adult Children Patient Interpreter Needed:  None Criminal Activity/Legal Involvement Pertinent to Current Situation/Hospitalization:  No - Comment as needed Significant Relationships:  Adult Children, Spouse Lives with:  Facility Resident Do you feel safe going back to the place where you live?  No Need for family participation in patient care:  Yes (Comment)  Care giving concerns:  N/A   Office managerocial Worker assessment / plan:  CSW spoke with the pt's son [name]. [name] confirmed that pt is a [facility] at [facility]. [name] would like the CSW to contact [facility] to confirmed if they can care for him there before making a choice to send the pt to a SNF. [name] did give the CSW permission [facility]   Employment status:  Retired Database administratornsurance information:  Managed Medicare PT Recommendations:  Skilled Nursing Facility Information / Referral to community resources:  Skilled Nursing Facility  Patient/Family's Response to care: [name] reported that the care in which the pt has received has been great.   Patient/Family's Understanding of and Emotional Response to Diagnosis, Current Treatment, and Prognosis:  [name] acknowledged the pt's diagnosis and current treatment. [name] acknowledged that the pt has been well of at his ALF.    Emotional Assessment Appearance:  Appears stated age Attitude/Demeanor/Rapport:  Unable to Assess Affect  (typically observed):  Unable to Assess Orientation:  Oriented to Self Alcohol / Substance use:  Not Applicable Psych involvement (Current and /or in the community):  No (Comment)  Discharge Needs  Concerns to be addressed:  Denies Needs/Concerns at this time Readmission within the last 30 days:  No Current discharge risk:  None Barriers to Discharge:  No Barriers Identified   [place], LCSW 10/07/2015, 3:07 PM

## 2015-10-07 NOTE — Progress Notes (Signed)
Pt arrived to 5M17 via bed.  Pt alert to self.  Will continue to monitor. Sondra ComeSilva, Juanmiguel Defelice M, RN

## 2015-10-07 NOTE — Progress Notes (Signed)
STROKE TEAM PROGRESS NOTE   HISTORY [name] is a 81 y.o. male w/ PMHx of PAF (not on anticoagulation, d/t hemorrhoids and multiple falls), CAD s/p CABG, chronic dCHF, HTN, HLD, and GERD, presented from independent living while working with OT, found to have left-sided flaccidity. Patient had improvement of left-sided symptoms but continued to have left arm weakness and mild left-sided neglect. Called as Code Stroke, CT head negative for bleed, patient given TPa.   He was admitted to the neuro ICU for further evaluation and treatment.   SUBJECTIVE (INTERVAL HISTORY) Quite agitated overnight, swinging at nurses intermittently, confused. Eye opening apraxia on exam, somewhat worsened left-sided weakness today. Repeat CT confirms R. Parietal lesion.    OBJECTIVE Temp:  [97.6 F (36.4 C)-100.6 F (38.1 C)] 99.1 F (37.3 C) (11/04 0830) Pulse Rate:  [73-91] 81 (11/04 0900) Cardiac Rhythm:  [-] Atrial fibrillation (11/04 0800) Resp:  [18-36] 19 (11/04 0900) BP: (88-152)/(37-104) 152/60 mmHg (11/04 0900) SpO2:  [96 %-100 %] 100 % (11/04 0900)  CBC:   Recent Labs Lab 10/05/15 1402 10/05/15 1410  WBC 7.1  --   NEUTROABS 5.0  --   HGB 11.1* 12.2*  HCT 34.0* 36.0*  MCV 93.2  --   PLT 250  --     Basic Metabolic Panel:   Recent Labs Lab 10/05/15 1402 10/05/15 1410  NA 141 143  K 4.2 4.1  CL 104 102  CO2 26  --   GLUCOSE 147* 148*  BUN 25* 28*  CREATININE 1.34* 1.40*  CALCIUM 8.9  --     Lipid Panel:     Component Value Date/Time   CHOL 145 10/06/2015 0344   TRIG 75 10/06/2015 0344   HDL 29* 10/06/2015 0344   CHOLHDL 5.0 10/06/2015 0344   VLDL 15 10/06/2015 0344   LDLCALC 101* 10/06/2015 0344   HgbA1c:  Lab Results  Component Value Date   HGBA1C 5.8* 10/06/2015    IMAGING  Ct Head Wo Contrast  10/06/2015  CLINICAL DATA:  81 year old male status post IV tPA 24 hours ago for left side weakness, code stroke. Initial encounter. EXAM: CT HEAD WITHOUT  CONTRAST TECHNIQUE: Contiguous axial images were obtained from the base of the skull through the vertex without intravenous contrast. COMPARISON:  10/05/2015, levin 18 2015. FINDINGS: Study is mildly degraded by motion artifact despite repeated imaging attempts. No acute osseous abnormality identified. Stable paranasal sinuses and mastoids. Stable orbit and scalp soft tissues No acute intracranial hemorrhage identified. No midline shift, mass effect, or evidence of intracranial mass lesion. The does appear to be a small area of new cortical hypodensity in the posterior right hemisphere, post central gyrus or adjacent (series 201, image 22). Suspect also the hypodensity in the parietal lobe seen on image 20 is new. There is however underlying confluent bilateral posterior frontal lobe and parietal lobe white matter hypodensity which appears stable. Gray-white matter DN appears stable. No suspicious intracranial vascular hyperdensity. IMPRESSION: 1. No acute intracranial hemorrhage or mass effect. 2. Suspicion of 1 or 2 small areas of evolving cortical infarct in the posterior right MCA territory. Underlying confluent bilateral MCA white matter hypodensity which appears stable. Electronically Signed   By: Odessa FlemingH  Hall M.D.   On: 10/06/2015 13:46   Ct Head Wo Contrast  10/05/2015  CLINICAL DATA:  Acute onset left-sided weakness EXAM: CT HEAD WITHOUT CONTRAST TECHNIQUE: Contiguous axial images were obtained from the base of the skull through the vertex without intravenous contrast. COMPARISON:  October 20, 2014 FINDINGS: Moderate diffuse atrophy is again noted. There is no intracranial mass, hemorrhage, extra-axial fluid collection, or midline shift. There is small vessel disease throughout the centra semiovale bilaterally, stable. There is evidence of a prior infarct in the mid right frontal lobe with gray-white junction. There is also evidence of prior infarct at the right frontal-parietal junction superiorly. A small  infarct in the superior most aspect of the left parietal lobe is stable as well. No new gray-white compartment lesion is identified. No acute infarct is evident. The middle cerebral arteries show normal attenuation bilaterally. The bony calvarium appears intact.  The mastoid air cells are clear. IMPRESSION: Atrophy with diffuse small vessel disease in the supratentorial white matter is stable. Prior infarcts remains stable as noted above. No acute infarct evident. No hemorrhage or mass effect. Critical Value/emergent results were called by telephone at the time of interpretation on 10/05/2015 at 2:18 pm to Dr. Ritta Slot, neurology , who verbally acknowledged these results. Electronically Signed   By: Bretta Bang III M.D.   On: 10/05/2015 14:20    PHYSICAL EXAM   General: Alert, cooperative, NAD. Disoriented. Follows commands.  HEENT: PERRL, EOMI. Moist mucus membranes. Eyes closed on exam.  Neck: Full range of motion without pain, supple, no lymphadenopathy or carotid bruits Lungs: Clear to ascultation bilaterally, normal work of respiration, no wheezes, rales, rhonchi Heart: RRR, no murmurs, gallops, or rubs Abdomen: Soft, non-tender, non-distended, BS + Extremities: No cyanosis, clubbing, or edema. Chronic venous stasis changes.   Mental Status -  Level of arousal intact, oriented to person and place.  Language including expression, naming, repetition, comprehension was assessed and found intact. Attention span and concentration were normal. Recent and remote memory impaired. Fund of Knowledge mildly impaired.   Cranial Nerves II - XII - II - Visual field intact. III, IV, VI - Extraocular movements intact. Eye opening apraxia.  V - Facial sensation intact bilaterally. VII - Facial movement intact bilaterally. VIII - Hearing slightly impaired bilaterally. X - Palate elevates symmetrically. XI - Chin turning & shoulder shrug intact bilaterally. XII - Tongue protrusion  intact.  Motor Strength - The patient's strength was 3-4/5 in left extremities, normal on the right, pronator drift was present on the left.  Bulk was normal and fasciculations were absent. Mild left hemi-neglect.   Motor Tone - Muscle tone was assessed at the neck and appendages and was normal.  Reflexes - The patient's reflexes were 1+ in all extremities and he had no pathological reflexes.  Sensory - Light touch, temperature/pinprick, vibration and proprioception, and Romberg testing were assessed and were symmetrical.   ASSESSMENT/PLAN [name] is a 81 y.o. male w/ PMHx of PAF (not on anticoagulation, d/t hemorrhoids and multiple falls), CAD s/p CABG, chronic dCHF, HTN, HLD, and GERD, admitted w/ acute onset left-sided weakness, likely 2/2 acute cardioembolic stroke.   Stroke:  Non-dominant suspected right cortical vs subcortical infarct thought to be embolic secondary PAF vs small vessel disease  Patient likely w/ cardioembolic etiology of stroke given h/o PAF, not on anticoagulation. H/o falls and  hemorrhoids. Given quality of life and functional capacity, fell patient is good candidate for ASA +   CT head: Atrophy with diffuse small vessel disease in the supratentorial white matter is stable. Prior  infarcts remains stable as noted above. No acute infarct evident. No hemorrhage or mass effect.   MRI: Not able to be performed d/t PPM  Carotid Doppler pending  2D Echo  EF 50-55%, basal midlateral akinesis, mild LVH. PAP 40 mm Hg.   LDL 101  HgbA1c pending  SCD's for VTE prophylaxis Diet Heart Room service appropriate?: Yes; Fluid consistency:: Thin  No antithrombotic prior to admission, ASA 81 mg + Plavix 75 mg daily for at least 3-6 months.  Patient counseled to be compliant with his antithrombotic medications  Ongoing aggressive stroke risk factor management  Therapy recommendations:  SNF  Disposition:  To SNF once medically stable. Discussed with wife and  son.  Delirium  Patient likely w/ underlying mild dementia vs MCI, now w/ in hospital delirium. Following commands.  Start Seroquel 12.5 mg qhs  PAF  Currently NSR  ASA + Plavix for at least 3-6 months  Chronic Diastolic CHF  Stable  Continue Torsemide 20 mg daily  Hypertension  Stable  Permissive hypertension (OK if < 220/120) but gradually normalize in 5-7 days  Hyperlipidemia  LDL 101, goal < 70  Other Stroke Risk Factors  Advanced age  Hx stroke/TIA  Coronary artery disease  Hospital day # 2  Patient agitated overnight, quite confused. Weakness on the left somewhat worsened today, mild left-sided hemineglect and eye opening apraxia. Following commands, answers questions appropriately. PT, OT recommend SNF placement. Discussed clinical status and disposition with wife and son who understand the plan and agree with SNF placement once medically stable. Transfer to neuro floor, start Seroquel 12.5 mg qhs for delirium and agitation.     Lauris Chroman, MD PGY-3, Internal Medicine Pager: [phone]  I have personally examined this patient, reviewed notes, independently viewed imaging studies, participated in medical decision making and plan of care. I have made any additions or clarifications directly to the above note. Agree with note above.   Delia Heady, MD Medical Director [facility] Pager: [phone] 10/07/2015 6:30 PM    To contact Stroke Continuity provider, please refer to [email or web address]. After hours, contact General Neurology

## 2015-10-07 NOTE — NC FL2 (Signed)
[place] MEDICAID FL2 LEVEL OF CARE SCREENING TOOL     IDENTIFICATION  Patient Name: [name] Birthdate: 10-01-1921 Sex: male Admission Date (Current Location): 10/05/2015  [facility] [place]:     Facility and Address:  [facility], [address], [place], [place] [address]      Provider Number: [id number]  Attending Physician Name and Address:  Micki RileyPramod S Sethi, MD  Relative Name and Phone Number:       Current Level of Care: Hospital Recommended Level of Care: Skilled Nursing Facility Prior Approval Number:    Date Approved/Denied:   PASRR Number: [id number]  [facility](SSN [id number])  Discharge Plan: SNF    Current Diagnoses: Patient Active Problem List   Diagnosis Date Noted  . CVA (cerebral infarction) 10/05/2015  . Pressure ulcer 10/05/2015  . Frequent falls 09/29/2015  . Acute renal failure (HCC) 02/14/2014  . Cellulitis and abscess of hand 02/13/2014  . A-fib (HCC)   . Dry skin dermatitis 02/08/2014  . Orthostatic hypotension 05/15/2013  . Recurrent falls 05/14/2013  . Edema of extremities 05/08/2013  . RLS (restless legs syndrome) 04/06/2013  . Gait disorder 04/06/2013  . Redness of skin, L leg  03/26/2013  . Chronotropic incompetence with sinus node dysfunction (HCC) 04/24/2012  . Coronary artery disease 03/30/2012  . Pacemaker-DDD-MDT 04/23/2011  . RECTAL BLEEDING 05/04/2010  . VENTRICULAR TACHYCARDIA 01/31/2010  . SYNCOPE 08/18/2009  . HYPERGLYCEMIA, MILD 08/18/2009  . CAD, ARTERY BYPASS GRAFT 06/24/2009  . Anemia 02/01/2009  . BACK PAIN 02/01/2009  . HYPERLIPIDEMIA 11/25/2008  . Atrial fibrillation (HCC) 11/25/2008  . Chronic diastolic heart failure (HCC) 11/25/2008  . GERD 11/25/2008  . BENIGN PROSTATIC HYPERTROPHY 11/25/2008  . INSOMNIA-SLEEP DISORDER-UNSPEC 11/25/2008  . Essential hypertension 06/07/2008  . SNORING 06/07/2008    Orientation ACTIVITIES/SOCIAL BLADDER RESPIRATION    Self  Family  supportive Incontinent Normal  BEHAVIORAL SYMPTOMS/MOOD NEUROLOGICAL BOWEL NUTRITION STATUS      Continent Diet (Heart Healthy )  PHYSICIAN VISITS COMMUNICATION OF NEEDS Height & Weight Skin    Verbally 5\' 10"  (177.8 cm) 194 lbs.  (Stage I)          AMBULATORY STATUS RESPIRATION     (Limited Assist ) Normal      Personal Care Assistance Level of Assistance  Bathing, Dressing, Feeding Bathing Assistance: Limited assistance Feeding assistance: Limited assistance Dressing Assistance: Limited assistance      Functional Limitations Info                SPECIAL CARE FACTORS FREQUENCY  PT (By licensed PT), OT (By licensed OT)                   Additional Factors Info  Code Status, Allergies, Isolation Precautions Code Status Info: Full Code  Allergies Info: Amlodipine, Atorvastatin, Zinc     Isolation Precautions Info: MRSA      Current Medications (10/07/2015): Current Facility-Administered Medications  Medication Dose Route Frequency Provider Last Rate Last Dose  . 0.9 %  sodium chloride infusion   Intravenous Continuous Ulice Dashavid R Smith, PA-C 75 mL/hr at 10/07/15 1400    . acetaminophen (TYLENOL) tablet 650 mg  650 mg Oral Q4H PRN Ulice Dashavid R Smith, PA-C   650 mg at 10/06/15 [id number]   Or  . acetaminophen (TYLENOL) suppository 650 mg  650 mg Rectal Q4H PRN Ulice Dashavid R Smith, PA-C      . ALPRAZolam Prudy Feeler(XANAX) tablet 0.5 mg  0.5 mg Oral QHS PRN Theron AristaPeter  Cristine Polio, DO   0.5 mg at 10/06/15 2102  . aspirin EC tablet 81 mg  81 mg Oral Daily Courtney Paris, MD   81 mg at 10/07/15 1108  . carvedilol (COREG) tablet 6.25 mg  6.25 mg Oral BID WC Ulice Dash, PA-C   6.25 mg at 10/07/15 0731  . Chlorhexidine Gluconate Cloth 2 % PADS 6 each  6 each Topical Q0600 Micki Riley, MD   6 each at 10/07/15 0600  . clopidogrel (PLAVIX) tablet 75 mg  75 mg Oral Daily Courtney Paris, MD   75 mg at 10/07/15 1108  . diphenhydrAMINE (BENADRYL) 12.5 MG/5ML elixir 12.5 mg  12.5 mg Oral Q6H PRN Ramond Marrow, DO       . feeding supplement (ENSURE ENLIVE) (ENSURE ENLIVE) liquid 237 mL  237 mL Oral BID BM Arlyss Gandy, RD      . fentaNYL (SUBLIMAZE) injection 25 mcg  25 mcg Intravenous Q2H PRN Ramond Marrow, DO   25 mcg at 10/07/15 0428  . HYDROcodone-acetaminophen (NORCO/VICODIN) 5-325 MG per tablet 1 tablet  1 tablet Oral Q4H PRN Noel Christmas   1 tablet at 10/07/15 0331  . labetalol (NORMODYNE,TRANDATE) injection 10 mg  10 mg Intravenous Q10 min PRN Ulice Dash, PA-C      . mupirocin ointment (BACTROBAN) 2 % 1 application  1 application Nasal BID Micki Riley, MD   1 application at 10/07/15 1108  . pantoprazole (PROTONIX) EC tablet 40 mg  40 mg Oral QHS Micki Riley, MD      . pramipexole (MIRAPEX) tablet 0.125 mg  0.125 mg Oral TID Ulice Dash, PA-C   0.125 mg at 10/07/15 1108  . QUEtiapine (SEROQUEL) tablet 12.5 mg  12.5 mg Oral QHS Courtney Paris, MD      . senna-docusate (Senokot-S) tablet 1 tablet  1 tablet Oral QHS PRN Ulice Dash, PA-C      . torsemide [facility]) tablet 20 mg  20 mg Oral Once Ulice Dash, PA-C   20 mg at 10/06/15 0000   Do not use this list as official medication orders. Please verify with discharge summary.  Discharge Medications:   Medication List    ASK your doctor about these medications        acetaminophen 500 MG tablet  Commonly known as:  TYLENOL  Take 500 mg by mouth every 8 (eight) hours as needed for moderate pain (pain).     B-complex with vitamin C tablet  Take 1 tablet by mouth daily.     beta carotene w/minerals tablet  Take 1 tablet by mouth daily.     carvedilol 6.25 MG tablet  Commonly known as:  COREG  Take 1 tablet (6.25 mg total) by mouth 2 (two) times daily with a meal.     potassium chloride 10 MEQ CR capsule  Commonly known as:  MICRO-K  TAKE ONE CAPSULE BY MOUTH DAILY ALTERNATING WITH TWO CAPSULES BY MOUTH DAILY     pramipexole 0.125 MG tablet  Commonly known as:  MIRAPEX  Take 1 tablet (0.125 mg total) by mouth 3 (three)  times daily.     torsemide 20 MG tablet  Commonly known as:  DEMADEX  TAKE 1 TABLET (20 MG TOTAL) BY MOUTH DAILY.        Relevant Imaging Results:  Relevant Lab Results:  Recent Labs    Additional Information    [name] Covelli, LCSW

## 2015-10-07 NOTE — Clinical Social Work Placement (Signed)
   CLINICAL SOCIAL WORK PLACEMENT  NOTE  Date:  10/07/2015  Patient Details  Name: [name] MRN: [id number] Date of Birth: Mar 03, 1921  Clinical Social Work is seeking post-discharge placement for this patient at the Skilled  Nursing Facility level of care (*CSW will initial, date and re-position this form in  chart as items are completed):  Yes   Patient/family provided with [place] Clinical Social Work Department's list of facilities offering this level of care within the geographic area requested by the patient (or if unable, by the patient's family).  Yes   Patient/family informed of their freedom to choose among providers that offer the needed level of care, that participate in Medicare, Medicaid or managed care program needed by the patient, have an available bed and are willing to accept the patient.  Yes   Patient/family informed of [facility]'s ownership interest in [facility] and [facility], as well as of the fact that they are under no obligation to receive care at these facilities.  PASRR submitted to EDS on 10/07/15     PASRR number received on 10/07/15     Existing PASRR number confirmed on       FL2 transmitted to all facilities in geographic area requested by pt/family on 10/07/15     FL2 transmitted to all facilities within larger geographic area on       Patient informed that his/her managed care company has contracts with or will negotiate with certain facilities, including the following:            Patient/family informed of bed offers received.  Patient chooses bed at       Physician recommends and patient chooses bed at      Patient to be transferred to   on  .  Patient to be transferred to facility by       Patient family notified on   of transfer.  Name of family member notified:        PHYSICIAN Please sign FL2     Additional Comment:    _______________________________________________ Gwynne EdingerBibbs, [facility], LCSW 10/07/2015,  3:24 PM

## 2015-10-07 NOTE — Plan of Care (Signed)
Problem: Tissue Perfusion: Goal: Complications of Intracerebral Hemorrhage will be minimized. Outcome: Not Applicable Date Met:  26/20/35 Ischemic stroke Goal: Complications of Spontaneous Subarachnoid Hemorrhage will be minimized. Outcome: Not Applicable Date Met:  59/74/16 Ischemic stroke

## 2015-10-08 ENCOUNTER — Inpatient Hospital Stay (HOSPITAL_COMMUNITY): Payer: Medicare Other

## 2015-10-08 DIAGNOSIS — R451 Restlessness and agitation: Secondary | ICD-10-CM | POA: Insufficient documentation

## 2015-10-08 DIAGNOSIS — R062 Wheezing: Secondary | ICD-10-CM | POA: Insufficient documentation

## 2015-10-08 DIAGNOSIS — R0989 Other specified symptoms and signs involving the circulatory and respiratory systems: Secondary | ICD-10-CM | POA: Insufficient documentation

## 2015-10-08 LAB — CK TOTAL AND CKMB (NOT AT ARMC)
CK, MB: 8.2 ng/mL — ABNORMAL HIGH (ref 0.5–5.0)
Relative Index: 2.1 (ref 0.0–2.5)
Total CK: 388 U/L (ref 49–397)

## 2015-10-08 LAB — GLUCOSE, CAPILLARY
GLUCOSE-CAPILLARY: 88 mg/dL (ref 65–99)
GLUCOSE-CAPILLARY: 96 mg/dL (ref 65–99)
GLUCOSE-CAPILLARY: 91 mg/dL (ref 65–99)
GLUCOSE-CAPILLARY: 95 mg/dL (ref 65–99)
Glucose-Capillary: 92 mg/dL (ref 65–99)

## 2015-10-08 LAB — CBC WITH DIFFERENTIAL/PLATELET
BASOS ABS: 0 10*3/uL (ref 0.0–0.1)
BASOS PCT: 0 %
EOS ABS: 0.2 10*3/uL (ref 0.0–0.7)
Eosinophils Relative: 3 %
HCT: 30.9 % — ABNORMAL LOW (ref 39.0–52.0)
HEMOGLOBIN: 10 g/dL — AB (ref 13.0–17.0)
LYMPHS ABS: 1.3 10*3/uL (ref 0.7–4.0)
Lymphocytes Relative: 16 %
MCH: 30.2 pg (ref 26.0–34.0)
MCHC: 32.4 g/dL (ref 30.0–36.0)
MCV: 93.4 fL (ref 78.0–100.0)
Monocytes Absolute: 0.6 10*3/uL (ref 0.1–1.0)
Monocytes Relative: 8 %
NEUTROS PCT: 73 %
Neutro Abs: 5.8 10*3/uL (ref 1.7–7.7)
Platelets: 202 10*3/uL (ref 150–400)
RBC: 3.31 MIL/uL — AB (ref 4.22–5.81)
RDW: 15.2 % (ref 11.5–15.5)
WBC: 7.9 10*3/uL (ref 4.0–10.5)

## 2015-10-08 LAB — BASIC METABOLIC PANEL
Anion gap: 6 (ref 5–15)
BUN: 16 mg/dL (ref 6–20)
CO2: 23 mmol/L (ref 22–32)
CREATININE: 0.97 mg/dL (ref 0.61–1.24)
Calcium: 7.9 mg/dL — ABNORMAL LOW (ref 8.9–10.3)
Chloride: 110 mmol/L (ref 101–111)
GFR calc Af Amer: >60 mL/min (ref >60)
GFR calc non Af Amer: >60 mL/min (ref >60)
GLUCOSE: 100 mg/dL — AB (ref 65–99)
POTASSIUM: 3.7 mmol/L (ref 3.5–5.1)
Sodium: 139 mmol/L (ref 135–145)

## 2015-10-08 LAB — BRAIN NATRIURETIC PEPTIDE: B NATRIURETIC PEPTIDE 5: 653.8 pg/mL — AB (ref 0.0–100.0)

## 2015-10-08 MED ORDER — IPRATROPIUM-ALBUTEROL 0.5-2.5 (3) MG/3ML IN SOLN
3.0000 mL | Freq: Four times a day (QID) | RESPIRATORY_TRACT | Status: DC
  Filled 2015-10-08: qty 3

## 2015-10-08 MED ORDER — TORSEMIDE 20 MG PO TABS
20.0000 mg | ORAL_TABLET | Freq: Every day | ORAL | Status: DC
  Administered 2015-10-08 – 2015-10-13 (×6): 20 mg via ORAL
  Filled 2015-10-08 (×6): qty 1

## 2015-10-08 MED ORDER — FUROSEMIDE 10 MG/ML IJ SOLN
10.0000 mg | Freq: Once | INTRAMUSCULAR | Status: AC
Start: 2015-10-08 — End: 2015-10-08
  Administered 2015-10-08: 10 mg via INTRAVENOUS
  Filled 2015-10-08: qty 4

## 2015-10-08 MED ORDER — IPRATROPIUM-ALBUTEROL 0.5-2.5 (3) MG/3ML IN SOLN
3.0000 mL | RESPIRATORY_TRACT | Status: DC | PRN
  Administered 2015-10-08 (×2): 3 mL via RESPIRATORY_TRACT
  Filled 2015-10-08: qty 3

## 2015-10-08 NOTE — Progress Notes (Signed)
STROKE TEAM PROGRESS NOTE   HISTORY [name] is a 81 y.o. male w/ PMHx of PAF (not on anticoagulation, d/t hemorrhoids and multiple falls), CAD s/p CABG, chronic dCHF, HTN, HLD, and GERD, presented from independent living while working with OT, found to have left-sided flaccidity. Patient had improvement of left-sided symptoms but continued to have left arm weakness and mild left-sided neglect. Called as Code Stroke, CT head negative for bleed, patient given TPa.   He was admitted to the neuro ICU for further evaluation and treatment.  He has since been transferred to the floor  SUBJECTIVE (INTERVAL HISTORY) Quite agitated this morning with wheezing and restless legs.  Sitter at bedside states he has improved since RN gave Fentanyl. STAT CXR revealed pulmonary congestion.  STAT lasix ordered and RT consulted for nebulizer treatments    OBJECTIVE Temp:  [98.1 F (36.7 C)-99.5 F (37.5 C)] 99.5 F (37.5 C) (11/05 1004) Pulse Rate:  [70-83] 80 (11/05 1004) Cardiac Rhythm:  [-] Atrial fibrillation (11/05 0400) Resp:  [16-21] 20 (11/05 1004) BP: (119-181)/(68-118) 119/79 mmHg (11/05 1004) SpO2:  [95 %-100 %] 100 % (11/05 1252)  CBC:   Recent Labs Lab 10/05/15 1402 10/05/15 1410  WBC 7.1  --   NEUTROABS 5.0  --   HGB 11.1* 12.2*  HCT 34.0* 36.0*  MCV 93.2  --   PLT 250  --     Basic Metabolic Panel:   Recent Labs Lab 10/05/15 1402 10/05/15 1410 10/08/15 1200  NA 141 143 139  K 4.2 4.1 3.7  CL 104 102 110  CO2 26  --  23  GLUCOSE 147* 148* 100*  BUN 25* 28* 16  CREATININE 1.34* 1.40* 0.97  CALCIUM 8.9  --  7.9*    Lipid Panel:     Component Value Date/Time   CHOL 145 10/06/2015 0344   TRIG 75 10/06/2015 0344   HDL 29* 10/06/2015 0344   CHOLHDL 5.0 10/06/2015 0344   VLDL 15 10/06/2015 0344   LDLCALC 101* 10/06/2015 0344   HgbA1c:  Lab Results  Component Value Date   HGBA1C 5.8* 10/06/2015    IMAGING  Ct Head Wo Contrast 10/06/2015   1. No  acute intracranial hemorrhage or mass effect.  2. Suspicion of 1 or 2 small areas of evolving cortical infarct in the posterior right MCA territory. Underlying confluent bilateral MCA white matter hypodensity which appears stable.   PHYSICAL EXAM General: Alert, cooperative, NAD. Disoriented. Follows commands. Difficult to understand HEENT: PERRL, EOMI. Moist mucus membranes. Eyes closed on exam.  Neck: Full range of motion without pain, supple, no lymphadenopathy or carotid bruits Lungs: wheezes Heart: RRR, no murmurs, gallops, or rubs Abdomen: Soft, non-tender, non-distended, BS + Extremities: No cyanosis, clubbing, or edema. Chronic venous stasis changes.   Mental Status -  Level of arousal intact, orientation exam limited by agitation  Language exam limited by agitation Attention span and concentration were decreased  Cranial Nerves II - XII - II - Visual field intact. III, IV, VI - Extraocular movements intact. Eye opening apraxia.  V - Facial sensation intact bilaterally. VII - Facial movement intact bilaterally. VIII - Hearing slightly impaired bilaterally. XI - shoulder shrug intact bilaterally. XII - Tongue protrusion intact.  Motor Strength - The patient's strength was 3-4/5 in left extremities, normal on the right, pronator drift was present on the left.  Bulk was normal and fasciculations were absent. Mild left hemi-neglect.   Motor Tone - Muscle tone was assessed at  the neck and appendages and was normal.  Sensory - Light touch intact  Gait:  Defered   ASSESSMENT/PLAN [name] is a 81 y.o. male w/ PMHx of PAF (not on anticoagulation, d/t hemorrhoids and multiple falls), PPM, CAD s/p CABG, chronic dCHF, HTN, HLD, and GERD, admitted w/ acute onset left-sided weakness, likely 2/2 acute cardioembolic stroke.   Stroke:  Non-dominant suspected right cortical vs subcortical infarct thought to be embolic secondary PAF vs small vessel disease  Patient likely w/  cardioembolic etiology of stroke given h/o PAF, not on anticoagulation. H/o falls and  hemorrhoids. Given quality of life and functional capacity, fell patient is good candidate for ASA +   CT head: Atrophy with diffuse small vessel disease in the supratentorial white matter is stable. Prior  infarcts remains stable as noted above. No acute infarct evident. No hemorrhage or mass effect.   MRI: Not able to be performed d/t PPM  Carotid Doppler pending  2D Echo  EF 50-55%, basal midlateral akinesis, mild LVH. PAP 40 mm Hg.   LDL 101  HgbA1c 5.8  SCD's for VTE prophylaxis Diet Heart Room service appropriate?: Yes; Fluid consistency:: Thin  No antithrombotic prior to admission, ASA 81 mg + Plavix 75 mg daily for at least 3-6 months.  Patient counseled to be compliant with his antithrombotic medications  Ongoing aggressive stroke risk factor management  Therapy recommendations:  SNF  Disposition:  To SNF once medically stable. Discussed with wife and son.  Delirium  Patient likely w/ underlying mild dementia vs MCI, now w/ in hospital delirium. Following commands.  Start Seroquel 12.5 mg qhs  PAF  Currently NSR  ASA + Plavix for at least 3-6 months  Chronic Diastolic CHF  Stable  Continue Torsemide 20 mg daily  Hypertension  Stable  Permissive hypertension (OK if < 220/120) but gradually normalize in 5-7 days  Hyperlipidemia  LDL 101, goal < 70  Wheezing  Portable chest x-ray - cardiac enlargement with pulmonary vascular congestion  Resume Demadex 20 mg1 PO with 10 mg IV Lasix now.  Await Bmet, BNP, CBC  Nebulizer treatments per respiratory therapy   Other Stroke Risk Factors  Advanced age  Hx stroke/TIA  Coronary artery disease  PAF  Hospital day # 3  NEUROLOGY ATTENDING NOTE Patient was seen and examined by me personally. I reviewed notes, independently viewed imaging studies, participated in medical decision making and plan of care. I have  made additions or clarifications directly to the above note.  Documentation accurately reflects findings. The laboratory and radiographic studies were personally reviewed by me.  ROS pertinent positives could not be fully documented due to LOC  Assessment and plan completed by me personally and fully documented above.  Condition is worsened; treatment as described above  I spent 45 minutes of patient care time in the care of  this patient.  SIGNED BY: Dr. Sula Soda     To contact Stroke Continuity provider, please refer to [email or web address]. After hours, contact General Neurology

## 2015-10-09 ENCOUNTER — Inpatient Hospital Stay (HOSPITAL_COMMUNITY): Payer: Medicare Other

## 2015-10-09 ENCOUNTER — Encounter (HOSPITAL_COMMUNITY): Payer: Self-pay

## 2015-10-09 LAB — BASIC METABOLIC PANEL
ANION GAP: 7 (ref 5–15)
BUN: 7 mg/dL (ref 6–20)
CHLORIDE: 109 mmol/L (ref 101–111)
CO2: 25 mmol/L (ref 22–32)
Calcium: 8.1 mg/dL — ABNORMAL LOW (ref 8.9–10.3)
Creatinine, Ser: 0.7 mg/dL (ref 0.61–1.24)
GFR calc Af Amer: >60 mL/min (ref >60)
Glucose, Bld: 105 mg/dL — ABNORMAL HIGH (ref 65–99)
POTASSIUM: 4 mmol/L (ref 3.5–5.1)
SODIUM: 141 mmol/L (ref 135–145)

## 2015-10-09 LAB — GLUCOSE, CAPILLARY
GLUCOSE-CAPILLARY: 130 mg/dL — AB (ref 65–99)
Glucose-Capillary: 86 mg/dL (ref 65–99)
Glucose-Capillary: 109 mg/dL — ABNORMAL HIGH (ref 65–99)
Glucose-Capillary: 97 mg/dL (ref 65–99)

## 2015-10-09 NOTE — Progress Notes (Signed)
STROKE TEAM PROGRESS NOTE   HISTORY [name] is a 81 y.o. male w/ PMHx of PAF (not on anticoagulation, d/t hemorrhoids and multiple falls), CAD s/p CABG, chronic dCHF, HTN, HLD, and GERD, presented from independent living while working with OT, found to have left-sided flaccidity. Patient had improvement of left-sided symptoms but continued to have left arm weakness and mild left-sided neglect. Called as Code Stroke, CT head negative for bleed, patient given TPa.   He was admitted to the neuro ICU for further evaluation and treatment.  He has since been transferred to the floor  SUBJECTIVE (INTERVAL HISTORY) Much less agitated this afternoon.  Much less wheezing and restless legs.  Sitter at bedside states he has improved.  He is eating better.      OBJECTIVE Temp:  [97.7 F (36.5 C)-99.2 F (37.3 C)] 99.2 F (37.3 C) (11/06 1032) Pulse Rate:  [71-94] 78 (11/06 1032) Cardiac Rhythm:  [-]  Resp:  [18-20] 20 (11/06 1032) BP: (138-167)/(69-97) 146/82 mmHg (11/06 1032) SpO2:  [93 %-100 %] 97 % (11/06 1032)  CBC:   Recent Labs Lab 10/05/15 1402 10/05/15 1410 10/08/15 1353  WBC 7.1  --  7.9  NEUTROABS 5.0  --  5.8  HGB 11.1* 12.2* 10.0*  HCT 34.0* 36.0* 30.9*  MCV 93.2  --  93.4  PLT 250  --  202    Basic Metabolic Panel:   Recent Labs Lab 10/08/15 1200 10/09/15 0451  NA 139 141  K 3.7 4.0  CL 110 109  CO2 23 25  GLUCOSE 100* 105*  BUN 16 7  CREATININE 0.97 0.70  CALCIUM 7.9* 8.1*    Lipid Panel:     Component Value Date/Time   CHOL 145 10/06/2015 0344   TRIG 75 10/06/2015 0344   HDL 29* 10/06/2015 0344   CHOLHDL 5.0 10/06/2015 0344   VLDL 15 10/06/2015 0344   LDLCALC 101* 10/06/2015 0344   HgbA1c:  Lab Results  Component Value Date   HGBA1C 5.8* 10/06/2015    IMAGING  Ct Head Wo Contrast 10/06/2015   1. No acute intracranial hemorrhage or mass effect.  2. Suspicion of 1 or 2 small areas of evolving cortical infarct in the posterior right  MCA territory. Underlying confluent bilateral MCA white matter hypodensity which appears stable.   DG Chest Portable 1 View 10/09/2015 1. Cardiomegaly with pulmonary venous congestion, but no frank pulmonary edema. 2. Atherosclerosis. 3. Postoperative changes and support apparatus, as above.  PHYSICAL EXAM General: Alert, cooperative, NAD. Disoriented. Follows commands. Much easier to understand today HEENT: PERRL, EOMI. Moist mucus membranes. Eyes closed on exam.  Neck: Full range of motion without pain, supple, no lymphadenopathy or carotid bruits Lungs: wheezes Heart: RRR, no murmurs, gallops, or rubs Abdomen: Soft, non-tender, non-distended, BS + Extremities: No cyanosis, clubbing, or edema. Chronic venous stasis changes.   Mental Status -  Level of arousal intact, orientation to person, "hospital", month and year.  He did get the date wrong  Language exam:  Fluent  Attention span and concentration were decreased  Cranial Nerves II - XII - II - Visual field intact. III, IV, VI - Extraocular movements intact. Eye opening apraxia.  V - Facial sensation intact bilaterally. VII - Facial movement intact bilaterally. VIII - Hearing slightly impaired bilaterally. XI - shoulder shrug intact bilaterally. XII - Tongue protrusion intact.  Motor Strength - The patient's strength was 3-4/5 in left extremities, normal on the right, pronator drift was present on the left.  Bulk was normal and fasciculations were absent. Mild left hemi-neglect.   Motor Tone - Muscle tone was assessed at the neck and appendages and was normal.  Sensory - Light touch intact  Gait:  Defered   ASSESSMENT/PLAN [name] is a 81 y.o. male w/ PMHx of PAF (not on anticoagulation, d/t hemorrhoids and multiple falls), PPM, CAD s/p CABG, chronic dCHF, HTN, HLD, and GERD, admitted w/ acute onset left-sided weakness, likely 2/2 acute cardioembolic stroke.   Stroke:  Non-dominant suspected right cortical vs  subcortical infarct thought to be embolic secondary PAF vs small vessel disease  Patient likely w/ cardioembolic etiology of stroke given h/o PAF, not on anticoagulation. H/o falls and hemorrhoids. Given quality of life and functional capacity, fell patient is good candidate for ASA +   CT head: Atrophy with diffuse small vessel disease in the supratentorial white matter is stable. Prior  infarcts remains stable as noted above. No acute infarct evident. No hemorrhage or mass effect.   MRI: Not able to be performed d/t PPM  Carotid Doppler pending  2D Echo  EF 50-55%, basal midlateral akinesis, mild LVH. PAP 40 mm Hg.   LDL 101  HgbA1c 5.8  SCD's for VTE prophylaxis Diet Heart Room service appropriate?: Yes; Fluid consistency:: Thin  No antithrombotic prior to admission, ASA 81 mg + Plavix 75 mg daily for at least 3-6 months.  Patient counseled to be compliant with his antithrombotic medications  Ongoing aggressive stroke risk factor management  Therapy recommendations:  SNF Disposition:  To SNF once medically stable.  Delirium  Patient likely w/ underlying mild dementia vs MCI, now w/ in hospital delirium. Following commands.  Seroquel 12.5 mg qhs  PAF  Currently NSR  ASA + Plavix for at least 3-6 months  Chronic Diastolic CHF  Continue Torsemide 20 mg daily  CXR 10/09/2015 - Cardiomegaly with pulmonary venous congestion, but no frank pulmonary edema.  I/O - +1,640 (D/C IV fluids)  Watch renal function BUN - 7 Creat - 0.70  10/09/2015  BNP 653.8  HHN treatments per resp therapy for wheezing  CXR and labs in AM  Breathing much improved today  Hypertension  Stable  Permissive hypertension (OK if < 220/120) but gradually normalize in 5-7 days  Hyperlipidemia  LDL 101, goal < 70   Other Stroke Risk Factors  Advanced age  Hx stroke/TIA  Coronary artery disease  PAF  Anemia H/H 10.0 / 30.9  Hospital day # 4  NEUROLOGY ATTENDING NOTE Patient  was seen and examined by me personally. I reviewed notes, independently viewed imaging studies, participated in medical decision making and plan of care. I have made additions or clarifications directly to the above note.  Documentation accurately reflects findings. The laboratory and radiographic studies were personally reviewed by me.  ROS pertinent positives could not be fully documented due to LOC  Assessment and plan completed by me personally and fully documented above.  Condition is worsened; treatment as described above  I spent 45 minutes of patient care time in the care of  this patient.  SIGNED BY: Dr. Sula Soda     To contact Stroke Continuity provider, please refer to [email or web address]. After hours, contact General Neurology

## 2015-10-10 ENCOUNTER — Inpatient Hospital Stay (HOSPITAL_COMMUNITY): Payer: Medicare Other

## 2015-10-10 ENCOUNTER — Ambulatory Visit (HOSPITAL_COMMUNITY): Payer: Medicare Other

## 2015-10-10 DIAGNOSIS — M79604 Pain in right leg: Secondary | ICD-10-CM

## 2015-10-10 DIAGNOSIS — E785 Hyperlipidemia, unspecified: Secondary | ICD-10-CM

## 2015-10-10 DIAGNOSIS — I480 Paroxysmal atrial fibrillation: Secondary | ICD-10-CM

## 2015-10-10 LAB — BASIC METABOLIC PANEL
Anion gap: 12 (ref 5–15)
BUN: 29 mg/dL — ABNORMAL HIGH (ref 6–20)
CHLORIDE: 100 mmol/L — AB (ref 101–111)
CO2: 26 mmol/L (ref 22–32)
CREATININE: 1.32 mg/dL — AB (ref 0.61–1.24)
Calcium: 8.3 mg/dL — ABNORMAL LOW (ref 8.9–10.3)
GFR calc non Af Amer: 45 mL/min — ABNORMAL LOW (ref >60)
GFR, EST AFRICAN AMERICAN: 53 mL/min — AB (ref >60)
Glucose, Bld: 112 mg/dL — ABNORMAL HIGH (ref 65–99)
POTASSIUM: 3.2 mmol/L — AB (ref 3.5–5.1)
Sodium: 138 mmol/L (ref 135–145)

## 2015-10-10 LAB — GLUCOSE, CAPILLARY
GLUCOSE-CAPILLARY: 108 mg/dL — AB (ref 65–99)
GLUCOSE-CAPILLARY: 114 mg/dL — AB (ref 65–99)
Glucose-Capillary: 141 mg/dL — ABNORMAL HIGH (ref 65–99)
Glucose-Capillary: 130 mg/dL — ABNORMAL HIGH (ref 65–99)

## 2015-10-10 LAB — CBC
HEMATOCRIT: 35.8 % — AB (ref 39.0–52.0)
HEMOGLOBIN: 11.6 g/dL — AB (ref 13.0–17.0)
MCH: 30.1 pg (ref 26.0–34.0)
MCHC: 32.4 g/dL (ref 30.0–36.0)
MCV: 92.7 fL (ref 78.0–100.0)
Platelets: 270 10*3/uL (ref 150–400)
RBC: 3.86 MIL/uL — AB (ref 4.22–5.81)
RDW: 15.1 % (ref 11.5–15.5)
WBC: 7.6 10*3/uL (ref 4.0–10.5)

## 2015-10-10 MED ORDER — ENOXAPARIN SODIUM 40 MG/0.4ML ~~LOC~~ SOLN
40.0000 mg | Freq: Every day | SUBCUTANEOUS | Status: DC
  Administered 2015-10-10 – 2015-10-11 (×2): 40 mg via SUBCUTANEOUS
  Filled 2015-10-10 (×2): qty 0.4

## 2015-10-10 MED ORDER — CARVEDILOL 3.125 MG PO TABS
3.1250 mg | ORAL_TABLET | Freq: Two times a day (BID) | ORAL | Status: DC
  Administered 2015-10-11 – 2015-10-13 (×4): 3.125 mg via ORAL
  Filled 2015-10-10 (×5): qty 1

## 2015-10-10 NOTE — Progress Notes (Signed)
SLP Cancellation Note  Patient Details Name: [name] MRN: [id number] DOB: 08/12/1921   Cancelled treatment:       Reason Eval/Treat Not Completed: Patient at procedure or test/unavailable.  SLP attempted session earlier in the day; however, patient unavailable and SLP was unable to return today.  Will continue attempts.    Fae PippinMelissa Erinn Huskins, M.A., CCC-SLP [phone]  Charene Mccallister 10/10/2015, 4:44 PM

## 2015-10-10 NOTE — Progress Notes (Signed)
STROKE TEAM PROGRESS NOTE   SUBJECTIVE (INTERVAL HISTORY) Patient with sitter at bedside. No reported complaints other than pain at RLE. Venous doppler showed no DVT.    OBJECTIVE Temp:  [98.4 F (36.9 C)-98.7 F (37.1 C)] 98.7 F (37.1 C) (11/07 0623) Pulse Rate:  [76-82] 78 (11/07 0623) Cardiac Rhythm:  [-]  Resp:  [16-20] 20 (11/07 0623) BP: (83-160)/(40-84) 158/71 mmHg (11/07 0623) SpO2:  [97 %-100 %] 98 % (11/07 0623)  CBC:   Recent Labs Lab 10/05/15 1402  10/08/15 1353 10/10/15 0545  WBC 7.1  --  7.9 7.6  NEUTROABS 5.0  --  5.8  --   HGB 11.1*  < > 10.0* 11.6*  HCT 34.0*  < > 30.9* 35.8*  MCV 93.2  --  93.4 92.7  PLT 250  --  202 270  < > = values in this interval not displayed.  Basic Metabolic Panel:   Recent Labs Lab 10/08/15 1200 10/09/15 0451  NA 139 141  K 3.7 4.0  CL 110 109  CO2 23 25  GLUCOSE 100* 105*  BUN 16 7  CREATININE 0.97 0.70  CALCIUM 7.9* 8.1*    Lipid Panel:     Component Value Date/Time   CHOL 145 10/06/2015 0344   TRIG 75 10/06/2015 0344   HDL 29* 10/06/2015 0344   CHOLHDL 5.0 10/06/2015 0344   VLDL 15 10/06/2015 0344   LDLCALC 101* 10/06/2015 0344   HgbA1c:  Lab Results  Component Value Date   HGBA1C 5.8* 10/06/2015    IMAGING I have personally reviewed the radiological images below and agree with the radiology interpretations.  Ct Head Wo Contrast 10/06/2015   1. No acute intracranial hemorrhage or mass effect.  2. Suspicion of 1 or 2 small areas of evolving cortical infarct in the posterior right MCA territory. Underlying confluent bilateral MCA white matter hypodensity which appears stable.   DG Chest Portable 1 View 10/10/2015 Stable mild cardiac enlargement without pulmonary edema. There is no acute cardiopulmonary abnormality.  Carotid Doppler   Preliminary findings: Right = 40-59% ICA stenosis. Left = 1-39% ICA stenosis.  Right vertebral not visualized. Left vertebral antegrade flow.  Venous doppler  right LE - negative for DVT  TTE -  - Left ventricle: The cavity size was normal. Wall thickness was increased in a pattern of mild LVH. Systolic function was normal. The estimated ejection fraction was in the range of 50% to 55%. There is akinesis of the basal-midlateral myocardium. - Aortic valve: Valve mobility was restricted. There was very mild stenosis. - Mitral valve: There was mild regurgitation. - Left atrium: The atrium was moderately dilated. - Right atrium: The atrium was mildly dilated. - Tricuspid valve: There was moderate regurgitation. - Pulmonary arteries: Systolic pressure was mildly increased. PA peak pressure: 40 mm Hg (S). Impressions: - Akinesis of the lateral wall with overall normal LV function; thickened aortic valve with very mild AS (peak velocity 2.1 m/s); mild MR; biatrial enlargment; moderate TR; mildly elevated pulmonary pressure.  PHYSICAL EXAM General: Alert, cooperative, NAD. Disoriented. Follows commands. Much easier to understand today HEENT: PERRL, EOMI. Moist mucus membranes. Eyes closed on exam.  Neck: Full range of motion without pain, supple, no lymphadenopathy or carotid bruits Lungs: wheezes Heart: RRR, no murmurs, gallops, or rubs Abdomen: Soft, non-tender, non-distended, BS + Extremities: No cyanosis, clubbing, or edema. Chronic venous stasis changes.   Mental Status -  Level of arousal intact, orientation to person, "hospital", month and year.  He did  get the date wrong  Language exam:  Fluent  Attention span and concentration were decreased  Cranial Nerves II - XII - II - Visual field intact. III, IV, VI - Extraocular movements intact. Eye opening apraxia.  V - Facial sensation intact bilaterally. VII - Facial movement intact bilaterally. VIII - Hearing slightly impaired bilaterally. XI - shoulder shrug intact bilaterally. XII - Tongue protrusion intact.  Motor Strength - The patient's strength was 3-4/5 in  left extremities, normal on the right, pronator drift was present on the left.  Bulk was normal and fasciculations were absent. Mild left hemi-neglect.   Motor Tone - Muscle tone was assessed at the neck and appendages and was normal.  Sensory - Light touch intact  Gait:  Defered   ASSESSMENT/PLAN [name] is a 81 y.o. male w/ PMHx of PAF (not on anticoagulation, d/t hemorrhoids and multiple falls), PPM, CAD s/p CABG, chronic dCHF, HTN, HLD, and GERD, admitted w/ acute onset left-sided weakness. Received IV tPA 10/05/2015 at 1433.  Stroke:  Non-dominant suspected right MCA parietal cortical infarct thought to be embolic secondary PAF s/p IV tPA   Patient likely w/ cardioembolic etiology of stroke given h/o PAF, not on anticoagulation. H/o falls and hemorrhoids. Given quality of life and functional capacity, feel patient is good candidate for dual antiplatelet  CT head right parietal infarct  MRI: Not able to be performed d/t PPM  Carotid Doppler R 40-59% stenosis  2D Echo  EF 50-55%  LDL 101  HgbA1c 5.8  SCD's for VTE prophylaxis Diet Heart Room service appropriate?: Yes; Fluid consistency:: Thin  No antithrombotic prior to admission, ASA 81 mg + Plavix 75 mg daily due to atrial fibrillation   Patient counseled to be compliant with his antithrombotic medications   Ongoing aggressive stroke risk factor management  Therapy recommendations:  SNF  Disposition:  To SNF. Medically ready for discharge once bed found. Ok to discontinue sitter.  R LE pain  RLE venous doppler showed no DVT  Delirium  Patient likely w/ underlying mild dementia vs MCI, now w/ in hospital delirium. Following commands.  Seroquel 12.5 mg qhs  PAF  Currently NSR  Not on anticoagulation prior to admission  d/t hemorrhoids and multiple falls  ASA + Plavix   Chronic Diastolic CHF  Continue Torsemide 20 mg daily  CXR 10/09/2015 - Cardiomegaly with pulmonary venous congestion, but no  frank pulmonary edema.  I/O - +1,640 (D/C IV fluids)  Watch renal function BUN - 7 Creat - 0.70  10/09/2015  BNP 653.8  HHN treatments per resp therapy for wheezing  CXR and labs in AM  Breathing much improved today  Hypertension  Stable  Permissive hypertension (OK if < 220/120) but gradually normalize in 5-7 days  Hyperlipidemia  LDL 101, goal < 70  Other Stroke Risk Factors  Advanced age  Hx stroke/TIA  Coronary artery disease  Anemia H/H 10.0 / 30.9  Hospital day # 5  Rhoderick MoodyBIBY,SHARON  [facility] See Amion for Pager information 10/10/2015 1:04 PM   I, the attending vascular neurologist, have personally obtained a history, examined the patient, evaluated laboratory data, individually viewed imaging studies and agree with radiology interpretations. Together with the NP/PA, we formulated the assessment and plan of care which reflects our mutual decision.  I have made any additions or clarifications directly to the above note and agree with the findings and plan as currently documented.    Marvel PlanJindong Steen Bisig, MD PhD Stroke Neurology 10/10/2015  11:21 PM    To contact Stroke Continuity provider, please refer to [email or web address]. After hours, contact General Neurology

## 2015-10-10 NOTE — Progress Notes (Signed)
VASCULAR LAB PRELIMINARY  PRELIMINARY  PRELIMINARY  PRELIMINARY  Right lower extremity venous duplex completed.    Preliminary report:  Right:  No evidence of DVT, superficial thrombosis, or Baker's cyst.  Allexus Ovens, RVT 10/10/2015, 3:36 PM

## 2015-10-11 ENCOUNTER — Telehealth: Payer: Self-pay | Admitting: *Deleted

## 2015-10-11 DIAGNOSIS — E860 Dehydration: Secondary | ICD-10-CM

## 2015-10-11 DIAGNOSIS — R748 Abnormal levels of other serum enzymes: Secondary | ICD-10-CM

## 2015-10-11 DIAGNOSIS — I953 Hypotension of hemodialysis: Secondary | ICD-10-CM

## 2015-10-11 LAB — BASIC METABOLIC PANEL
Anion gap: 12 (ref 5–15)
BUN: 27 mg/dL — ABNORMAL HIGH (ref 6–20)
CALCIUM: 8.5 mg/dL — AB (ref 8.9–10.3)
CO2: 29 mmol/L (ref 22–32)
CREATININE: 1.69 mg/dL — AB (ref 0.61–1.24)
Chloride: 101 mmol/L (ref 101–111)
GFR calc non Af Amer: 34 mL/min — ABNORMAL LOW (ref >60)
GFR, EST AFRICAN AMERICAN: 39 mL/min — AB (ref >60)
Glucose, Bld: 118 mg/dL — ABNORMAL HIGH (ref 65–99)
Potassium: 2.8 mmol/L — ABNORMAL LOW (ref 3.5–5.1)
Sodium: 142 mmol/L (ref 135–145)

## 2015-10-11 LAB — CBC
HEMATOCRIT: 32.6 % — AB (ref 39.0–52.0)
Hemoglobin: 10.6 g/dL — ABNORMAL LOW (ref 13.0–17.0)
MCH: 30.1 pg (ref 26.0–34.0)
MCHC: 32.5 g/dL (ref 30.0–36.0)
MCV: 92.6 fL (ref 78.0–100.0)
Platelets: 272 10*3/uL (ref 150–400)
RBC: 3.52 MIL/uL — ABNORMAL LOW (ref 4.22–5.81)
RDW: 15.1 % (ref 11.5–15.5)
WBC: 7.7 10*3/uL (ref 4.0–10.5)

## 2015-10-11 LAB — GLUCOSE, CAPILLARY
Glucose-Capillary: 111 mg/dL — ABNORMAL HIGH (ref 65–99)
Glucose-Capillary: 113 mg/dL — ABNORMAL HIGH (ref 65–99)
Glucose-Capillary: 134 mg/dL — ABNORMAL HIGH (ref 65–99)
Glucose-Capillary: 145 mg/dL — ABNORMAL HIGH (ref 65–99)

## 2015-10-11 MED ORDER — SODIUM CHLORIDE 0.9 % IV SOLN
INTRAVENOUS | Status: DC
  Administered 2015-10-11 – 2015-10-13 (×3): via INTRAVENOUS

## 2015-10-11 MED ORDER — POTASSIUM CHLORIDE CRYS ER 20 MEQ PO TBCR
40.0000 meq | EXTENDED_RELEASE_TABLET | ORAL | Status: AC
  Administered 2015-10-11 (×3): 40 meq via ORAL
  Filled 2015-10-11 (×3): qty 2

## 2015-10-11 NOTE — Clinical Social Work Placement (Signed)
   CLINICAL SOCIAL WORK PLACEMENT  NOTE  Date:  10/11/2015  Patient Details  Name: [name] MRN: [id number] Date of Birth: 07/28/1921  Clinical Social Work is seeking post-discharge placement for this patient at the Skilled  Nursing Facility level of care (*CSW will initial, date and re-position this form in  chart as items are completed):  Yes   Patient/family provided with [place] Clinical Social Work Department's list of facilities offering this level of care within the geographic area requested by the patient (or if unable, by the patient's family).  Yes   Patient/family informed of their freedom to choose among providers that offer the needed level of care, that participate in Medicare, Medicaid or managed care program needed by the patient, have an available bed and are willing to accept the patient.  Yes   Patient/family informed of [facility]'s ownership interest in [facility] and [facility], as well as of the fact that they are under no obligation to receive care at these facilities.  PASRR submitted to EDS on 10/07/15     PASRR number received on 10/07/15     Existing PASRR number confirmed on       FL2 transmitted to all facilities in geographic area requested by pt/family on 10/07/15     FL2 transmitted to all facilities within larger geographic area on       Patient informed that his/her managed care company has contracts with or will negotiate with certain facilities, including the following:        Yes   Patient/family informed of bed offers received.  [facility]     Physician recommends and patient chooses bed at      [facility].  Patient to be transferred to facility by [facility]      Patient family notified on 10/11/15 of transfer.  Name of family member notified:  [name], son      PHYSICIAN       Additional Comment:     _______________________________________________ Gwynne EdingerBibbs, Tushar Enns, LCSW 10/11/2015, 10:36 AM

## 2015-10-11 NOTE — Care Management Important Message (Signed)
Important Message  Patient Details  Name: Zannie CoveDwight L Wartman MRN: [id number] Date of Birth: 1921-01-09   Medicare Important Message Given:  Yes-second notification given    Kermit BaloKelli F Bonnie Overdorf, RN 10/11/2015, 2:46 PM

## 2015-10-11 NOTE — Progress Notes (Signed)
Physical Therapy Treatment Patient Details Name: [name] MRN: [id number] DOB: July 14, 1921 Today's Date: 10/11/2015    History of Present Illness 81 y.o. male w/ PMHx of PAF (not on anticoagulation, d/t hemorrhoids and multiple falls), CAD s/p CABG, chronic dCHF, HTN, HLD, and GERD, presented from independent living while working with OT, found to have left-sided flaccidity. Patient had improvement of left-sided symptoms but continued to have left arm weakness and mild left-sided neglect. Called as Code Stroke, CT head negative for bleed, patient given TPa.     PT Comments    Progressing slowly, mildly agitated, crying out when rolling for pericare.  Majority of treatment emphasis on sitting balance, transitions to/from sitting and standing.  Follow Up Recommendations  SNF     Equipment Recommendations  None recommended by PT    Recommendations for Other Services       Precautions / Restrictions Precautions Precautions: Fall Restrictions Weight Bearing Restrictions: No    Mobility  Bed Mobility Overal bed mobility: +2 for physical assistance;Needs Assistance Bed Mobility: Supine to Sit;Sit to Supine     Supine to sit: Max assist;+2 for physical assistance Sit to supine: Max assist;+2 for physical assistance   General bed mobility comments: cues and assist for guiding through task  Transfers Overall transfer level: Needs assistance Equipment used: 2 person hand held assist Transfers: Sit to/from Stand Sit to Stand: +2 physical assistance;Max assist         General transfer comment: pt refused OOB.  Ambulation/Gait                 Stairs            Wheelchair Mobility    Modified Rankin (Stroke Patients Only) Modified Rankin (Stroke Patients Only) Pre-Morbid Rankin Score: Moderately severe disability Modified Rankin: Severe disability     Balance Overall balance assessment: Needs assistance Sitting-balance support: No upper extremity  supported;Feet supported Sitting balance-Leahy Scale: Poor Sitting balance - Comments: Lists off to the Right consistently though slowly,  unable to coming into midline without assist     Standing balance-Leahy Scale: Zero Standing balance comment: unable to attain full upright stance                    Cognition Arousal/Alertness: Lethargic;Awake/alert Behavior During Therapy: Flat affect;Anxious Overall Cognitive Status: Impaired/Different from baseline     Current Attention Level: Sustained   Following Commands: Follows one step commands with increased time Safety/Judgement: Decreased awareness of safety;Decreased awareness of deficits Awareness: Intellectual Problem Solving: Slow processing;Difficulty sequencing;Requires verbal cues;Requires tactile cues General Comments: Pt with eyes closed throughout session. Speech difficult to understand at times.     Exercises      General Comments        Pertinent Vitals/Pain Pain Assessment: Faces Faces Pain Scale: Hurts even more Pain Location: unable to state Pain Descriptors / Indicators: Grimacing Pain Intervention(s): Monitored during session;Repositioned    Home Living                      Prior Function            PT Goals (current goals can now be found in the care plan section) Acute Rehab PT Goals Patient Stated Goal: to get some rehab PT Goal Formulation: With patient Time For Goal Achievement: 10/20/15 Potential to Achieve Goals: Fair Progress towards PT goals: Progressing toward goals    Frequency  Min 3X/week    PT Plan Current plan remains  appropriate    Co-evaluation             End of Session   Activity Tolerance: Patient limited by fatigue;Patient tolerated treatment well Patient left: in bed;with call bell/phone within reach     Time: 1335-1358 PT Time Calculation (min) (ACUTE ONLY): 23 min  Charges:  $Therapeutic Activity: 23-37 mins                    G Codes:       Christiona Siddique, Eliseo Gum 10/11/2015, 3:56 PM 10/11/2015  [place] Bing, PT [phone]  (pager)

## 2015-10-11 NOTE — Progress Notes (Addendum)
Occupational Therapy Treatment Patient Details Name: [name] CoveDwight L Whipple MRN: [id number] DOB: 1921/01/10 Today's Date: 10/11/2015    History of present illness 81 y.o. male w/ PMHx of PAF (not on anticoagulation, d/t hemorrhoids and multiple falls), CAD s/p CABG, chronic dCHF, HTN, HLD, and GERD, presented from independent living while working with OT, found to have left-sided flaccidity. Patient had improvement of left-sided symptoms but continued to have left arm weakness and mild left-sided neglect. Called as Code Stroke, CT head negative for bleed, patient given TPa.    OT comments  Pt progressing minimally towards acute OT goals. Pt completed bed mobility with +2 max assist. Pt sat EOB for 3-4 minutes with mod A posteriorly. Mod A to wash face in sitting position. Pt noted to have eyes closed throughout session, labile, verbalizing but difficult to understand at times. D/c plan remains appropriate.   Follow Up Recommendations  SNF;Supervision/Assistance - 24 hour    Equipment Recommendations       Recommendations for Other Services      Precautions / Restrictions Precautions Precautions: Fall Restrictions Weight Bearing Restrictions: No       Mobility Bed Mobility Overal bed mobility: +2 for physical assistance;Needs Assistance Bed Mobility: Supine to Sit;Sit to Supine     Supine to sit: Max assist;+2 for physical assistance Sit to supine: Max assist;+2 for physical assistance      Transfers                 General transfer comment: not attempted due to decreased cognition and poor sitting balance    Balance Overall balance assessment: Needs assistance Sitting-balance support: No upper extremity supported;Feet supported Sitting balance-Leahy Scale: Poor Sitting balance - Comments: Pt agitated and voiced fear of falling. Mod assist posteriorly due to posterior lean. Pt unable to maintain BUE in supportive position.                            ADL  Overall ADL's : Needs assistance/impaired     Grooming: Moderate assistance;Wash/dry face;Sitting                                 General ADL Comments: Pt completed bed mobility with +2 max-total assist. Pt with eyes closed during session but somewhat verbal though difficult to understand at times. Pt sat EOB with mod A for 3-4 minutes. Washcloth placed in hand and with moderate cueing and moderate assist pt wiped face.       Vision                     Perception     Praxis      Cognition   Behavior During Therapy: Flat affect;Agitated (Labile)                    General Comments: Pt with eyes closed throughout session. Speech difficult to understand at times.     Extremity/Trunk Assessment               Exercises     Shoulder Instructions       General Comments      Pertinent Vitals/ Pain       Pain Assessment: Faces Faces Pain Scale: Hurts even more Pain Location: unable to state, during bed mobility Pain Descriptors / Indicators: Moaning  Home Living  Prior Functioning/Environment              Frequency Min 2X/week     Progress Toward Goals  OT Goals(current goals can now be found in the care plan section)  Progress towards OT goals: Progressing toward goals  Acute Rehab OT Goals Patient Stated Goal: to get some rehab OT Goal Formulation: Patient unable to participate in goal setting Time For Goal Achievement: 10/20/15 Potential to Achieve Goals: Good ADL Goals Pt Will Perform Eating: with set-up;sitting Pt Will Perform Grooming: with supervision;with set-up;sitting Pt Will Perform Upper Body Bathing: with set-up;with supervision;sitting Pt Will Perform Lower Body Bathing: with min assist;sit to/from stand Pt Will Transfer to Toilet: bedside commode;with +2 assist;with min assist Additional ADL Goal #1: Use LUE as functional assist with minimal vc  Plan  Discharge plan remains appropriate    Co-evaluation                 End of Session Equipment Utilized During Treatment: Gait belt;Rolling walker   Activity Tolerance Patient tolerated treatment well   Patient Left in bed;with call bell/phone within reach;with nursing/sitter in room;with SCD's reapplied   Nurse Communication          Time: 1610-9604 OT Time Calculation (min): 28 min  Charges: OT General Charges $OT Visit: 1 Procedure OT Treatments $Self Care/Home Management : 23-37 mins  Pilar Grammes 10/11/2015, 12:51 PM

## 2015-10-11 NOTE — Progress Notes (Signed)
STROKE TEAM PROGRESS NOTE   SUBJECTIVE (INTERVAL HISTORY) Sitter still present in room with patient. Discussed discontinuation of sitter with SW yesterday. Pt Cre trending up and seems not good on fluid intake for the last 2 days. However, pt also choked on water this am.    OBJECTIVE Temp:  [97.4 F (36.3 C)-98.6 F (37 C)] 98.2 F (36.8 C) (11/08 0948) Pulse Rate:  [73-83] 75 (11/08 0948) Cardiac Rhythm:  [-]  Resp:  [16-20] 18 (11/08 0948) BP: (100-158)/(46-69) 100/46 mmHg (11/08 0948) SpO2:  [95 %-100 %] 100 % (11/08 0948)  CBC:   Recent Labs Lab 10/05/15 1402  10/08/15 1353 10/10/15 0545 10/11/15 0430  WBC 7.1  --  7.9 7.6 7.7  NEUTROABS 5.0  --  5.8  --   --   HGB 11.1*  < > 10.0* 11.6* 10.6*  HCT 34.0*  < > 30.9* 35.8* 32.6*  MCV 93.2  --  93.4 92.7 92.6  PLT 250  --  202 270 272  < > = values in this interval not displayed.  Basic Metabolic Panel:   Recent Labs Lab 10/10/15 1515 10/11/15 0430  NA 138 142  K 3.2* 2.8*  CL 100* 101  CO2 26 29  GLUCOSE 112* 118*  BUN 29* 27*  CREATININE 1.32* 1.69*  CALCIUM 8.3* 8.5*    PHYSICAL EXAM General: Alert, cooperative, NAD. Disoriented. Follows commands. Much easier to understand today HEENT: PERRL, EOMI. Moist mucus membranes. Eyes closed on exam.  Neck: Full range of motion without pain, supple, no lymphadenopathy or carotid bruits Lungs: wheezes Heart: RRR, no murmurs, gallops, or rubs Abdomen: Soft, non-tender, non-distended, BS + Extremities: No cyanosis, clubbing, or edema. Chronic venous stasis changes.   Mental Status -  Level of arousal intact, orientation to person, "hospital", month and year.  He did get the date wrong  Language exam:  Fluent  Attention span and concentration were decreased  Cranial Nerves II - XII - II - Visual field intact. III, IV, VI - Extraocular movements intact. Eye opening apraxia.  V - Facial sensation intact bilaterally. VII - Facial movement intact  bilaterally. VIII - Hearing slightly impaired bilaterally. XI - shoulder shrug intact bilaterally. XII - Tongue protrusion intact.  Motor Strength - The patient's strength was 3-4/5 in left extremities, normal on the right, pronator drift was present on the left.  Bulk was normal and fasciculations were absent. Mild left hemi-neglect.   Motor Tone - Muscle tone was assessed at the neck and appendages and was normal.  Sensory - Light touch intact  Gait:  Defered   Images: I have personally reviewed the radiological images below and agree with the radiology interpretations.   Ct Head Wo Contrast  10/06/2015  IMPRESSION: 1. No acute intracranial hemorrhage or mass effect. 2. Suspicion of 1 or 2 small areas of evolving cortical infarct in the posterior right MCA territory. Underlying confluent bilateral MCA white matter hypodensity which appears stable.   10/05/2015  IMPRESSION: Atrophy with diffuse small vessel disease in the supratentorial white matter is stable. Prior infarcts remains stable as noted above. No acute infarct evident. No hemorrhage or mass effect.   Dg Chest Port 1 View  10/10/2015  IMPRESSION: Stable mild cardiac enlargement without pulmonary edema. There is no acute cardiopulmonary abnormality.   10/09/2015   IMPRESSION: Cardiomegaly.  No acute cardiopulmonary process.   10/09/2015  IMPRESSION: 1. Cardiomegaly with pulmonary venous congestion, but no frank pulmonary edema. 2. Atherosclerosis. 3. Postoperative changes and support  apparatus, as above.   10/08/2015  IMPRESSION: 1. Cardiac enlargement and pulmonary vascular congestion.   CUS - - Findings consistent with 40 - 59 percent stenosis involving the  right internal carotid artery. - Findings consistent with 1- 39 percent stenosis involving the  left internal carotid artery. - ICA/CCA ratio. right = 2.9. left = 0.86. - Right vertebral not visualized. Left vertebral antegrade flow is  antegrade.  TTE - - Left  ventricle: The cavity size was normal. Wall thickness was increased in a pattern of mild LVH. Systolic function was normal. The estimated ejection fraction was in the range of 50% to 55%. There is akinesis of the basal-midlateral myocardium. - Aortic valve: Valve mobility was restricted. There was very mild stenosis. - Mitral valve: There was mild regurgitation. - Left atrium: The atrium was moderately dilated. - Right atrium: The atrium was mildly dilated. - Tricuspid valve: There was moderate regurgitation. - Pulmonary arteries: Systolic pressure was mildly increased. PA peak pressure: 40 mm Hg (S). Impressions: - Akinesis of the lateral wall with overall normal LV function; thickened aortic valve with very mild AS (peak velocity 2.1 m/s); mild MR; biatrial enlargment; moderate TR; mildly elevated pulmonary pressure.    ASSESSMENT/PLAN [name] is a 81 y.o. male w/ PMHx of PAF (not on anticoagulation, d/t hemorrhoids and multiple falls), pacemaker, CAD s/p CABG, chronic dCHF, HTN, HLD, and GERD, admitted w/ acute onset left-sided weakness. Received IV tPA 10/05/2015 at 1433.  Stroke:  Non-dominant suspected right MCA parietal cortical infarct thought to be embolic secondary PAF s/p IV tPA   Patient likely w/ cardioembolic etiology of stroke given h/o PAF, not on anticoagulation. H/o falls and hemorrhoids. Given quality of life and functional capacity, feel patient is good candidate for dual antiplatelet  CT head right parietal infarct  MRI: Not able to be performed d/t PPM  Carotid Doppler R 40-59% stenosis  2D Echo  EF 50-55%  LDL 101  HgbA1c 5.8  SCD's for VTE prophylaxis Diet Heart Room service appropriate?: Yes; Fluid consistency:: Thin  No antithrombotic prior to admission, ASA 81 mg + Plavix 75 mg daily due to atrial fibrillation   Patient counseled to be compliant with his antithrombotic medications   Ongoing aggressive stroke risk  factor management  Therapy recommendations:  SNF  Disposition:  To SNF. Discontinue sitter. Discussed with charge RN   Chronic Diastolic CHF  Continue Torsemide 20 mg daily  CXR 10/11/2015 - resolved pulmonary edema.  BNP 653.8  CXR and labs in AM  Dehydration  Placed back on torsemide due to CHF  But Cr 0.7->1.69 since 11/6  Encouraged po intake  Start NS at 50  CXR in am  R LE pain  RLE venous doppler showed no DVT  Delirium  Patient likely w/ underlying mild dementia vs MCI, now w/ in hospital delirium. Following commands.  Seroquel 12.5 mg qhs  PAF  Currently NSR  Not on anticoagulation prior to admission  d/t hemorrhoids and multiple falls  ASA + Plavix   Hypertension  Stable   On the low side likely due to dehydration  Continue torsemide  Decrease coreg to 3.125mg  bid  Hyperlipidemia  LDL 101, goal < 70  Statin intolerance in the past  outpt follow up with PCP  Hypokalemia  K 4.0-> 2.7 since 11/6  Replace, 40 meq x 3  BMET in am  Dysphagia  Coughing noted with sitter giving him water  On Diet Heart Room service appropriate?: Yes; Fluid  consistency:: Thin  Ask ST to reassess swallow  Other Stroke Risk Factors  Advanced age  Hx stroke/TIA  Coronary artery disease  Anemia H/H 10.0 / 30.9  Hospital day # 6  Marvel Plan, MD PhD Stroke Neurology 10/11/2015 11:44 AM   To contact Stroke Continuity provider, please refer to [email or web address]. After hours, contact General Neurology

## 2015-10-11 NOTE — Progress Notes (Signed)
Pt safety sitter discontinued per MD order. Sondra ComeSilva, Timmey Lamba M, RN

## 2015-10-11 NOTE — Progress Notes (Signed)
Speech Language Pathology Treatment: Cognitive-Linquistic  Patient Details Name: [name] MRN: [id number] DOB: 1921-10-03 Today's Date: 10/11/2015 Time: 1205-1213 SLP Time Calculation (min) (ACUTE ONLY): 8 min  Assessment / Plan / Recommendation Clinical Impression  Skilled treatment session focused on addressing cognition goals.  Patient restless, verbose and confused upon SLP arrival with sitter at bedside.  Patient required Max assist multimodal cues to sustain attention to 1 bite or sip at a time; patient unable to sustain attention to 2 bites despite Max assist multimodal cues.  SLP also provided hand-over-hand assist to problem solve self-feed without success given impact of internal distractions.  Patient required Total assist for orientation as well at the beginning and end of session.  RN notified of current status.  SLP will continue to address cognition and dysphagia goals at next visit.    HPI Other Pertinent Information: 81 year old male from independent living facility admitted with left arm weakness, given tPA. CT head indicates suspicion of 1 or 2 small areas of evolving cortical infarct in the posterior right MCA territory.   Pertinent Vitals Pain Assessment: Faces Faces Pain Scale: Hurts even more Pain Location: unable to state, during bed mobility Pain Descriptors / Indicators: Moaning;Grimacing Pain Intervention(s): Limited activity within patient's tolerance;Monitored during session;Repositioned;Patient requesting pain meds-RN notified  SLP Plan  Continue with current plan of care    Recommendations Medication Administration: Whole meds with puree Compensations: Follow solids with liquid;Check for pocketing;Slow rate;Small sips/bites              Oral Care Recommendations: Oral care BID Follow up Recommendations: 24 hour supervision/assistance;Skilled Nursing facility Plan: Continue with current plan of care    GO    Charlane FerrettiMelissa Khori Underberg, M.A.,  CCC-SLP [phone]  Jamilynn Whitacre 10/11/2015, 1:37 PM

## 2015-10-11 NOTE — Evaluation (Addendum)
Clinical/Bedside Swallow Evaluation Patient Details  Name: [name] MRN: [id number] Date of Birth: [birth year]  Today's Date: 10/11/2015 Time: SLP Start Time (ACUTE ONLY): 1155 SLP Stop Time (ACUTE ONLY): 1205 SLP Time Calculation (min) (ACUTE ONLY): 10 min  Past Medical History:  Past Medical History  Diagnosis Date  . A-fib (HCC)     paroxysmal, s/p TEE-DCCV 03/2013;  was on coumadin , pt desired to stop it , no longer a candidate per cards (hemorrhoidal bleeding; falls)  . CAD (coronary artery disease)     a. MI angioplasty 93, b. s/p CABG 01/2006;   c.  NSTEMI 06/2010 - [facility]:  L-LAD ok, S-Dx ok, S-OM ok, S-PDA ok, small OM sub total occluded - Med Rx  . Chronic diastolic heart failure (HCC)   . Hyperlipidemia   . Hypertension   . Spinal stenosis      has seen Dr Ethelene Hal before, s/p local injection which helped x a while   . GERD (gastroesophageal reflux disease)   . RLS (restless legs syndrome)   . Insomnia   . History of BPH     TURP '96 at [facility], urethoplast 97 ( History of bladder outlet obstruction, chronic incontinence)  . B12 deficiency anemia   . Myocardial infarction (HCC) 01/1992; 06/2010  . Arthritis   . Hx of cardiovascular stress test     a.  Myoview (10/2005):  EF 62%, mild apical ischemia  . Hx of echocardiogram     a.  Echo (03/23/13):  mod LVH, EF 50-55%, no WMA, mild MR, mod LAE, mild RVE, mod RAE, mod TR   Past Surgical History:  Past Surgical History  Procedure Laterality Date  . Coronary artery bypass graft      "CABG X4" (05/14/2013)  . Cystoscopy with urethral dilatation  1997    "fixed strictures from TURP" (05/14/2013)  . Inguinal hernia repair Right   . Carpal tunnel release Bilateral ~ 2005    Dr Teressa Senter  . Pacemaker insertion  12/2006    MDT dual chamber PPM implanted for SSS  . Tee without cardioversion N/A 03/27/2013    Procedure: TRANSESOPHAGEAL ECHOCARDIOGRAM (TEE);  Surgeon: Lewayne Bunting, MD;  Location[facility] ENDOS[facility];  Service:  Cardiovascular;  Laterality: N/A;  . Cardioversion N/A 03/27/2013    Procedure: CARDIOVERSION;  Surgeon: Lewayne Bunting, MD;  Location: [facility] ENDOSCOPY;  Service: Cardiovascular;  Laterality: N/A;  . Total knee arthroplasty Bilateral     right 2005, Left 2006.  Dr  Despina Hick  . Coronary angioplasty      "Dr. Riley Kill"  . Cataract extraction w/ intraocular lens  implant, bilateral Bilateral 2012  . Transurethral resection of prostate  1996  . Ep implantable device N/A 06/24/2015    Procedure:  PPM Generator Changeout;  Surgeon: [facility] Salvia, MD;  Location: [facility] INVASIVE CV LAB;  Service: Cardiovascular;  Laterality: N/A;  . Ep implantable device N/A 06/27/2015    Procedure: PPM Generator Changeout;  Surgeon: [facility] Salvia, MD;  Location: [facility] INVASIVE CV LAB;  Service: Cardiovascular;  Laterality: N/A;   HPI:  81 year old male from independent living facility admitted with left arm weakness, given tPA. CT head indicates suspicion of 1 or 2 small areas of evolving cortical infarct in the posterior right MCA territory.  Orders for bedside swallow evaluation received due to reports of coughing with thin liquids this am.   Assessment / Plan / Recommendation Clinical Impression  Bedside swallow evaluation complete.  Patient unable to  follow commands to complete oral motor exam; however, appeared grossly intact.  SLP facilitated session with Total assist to feed patient and Max assist multimodal cues for sustained attention to boluses until completion of swallow, which resulted in delayed cough x1 out of ~10 thin liquid trials.  Patient required redirection for effective mastication of regular textures but with cuing was able to effectively transit and clear oral cavity.  Patient with altered mental status/severe cognitive impairments (see cognitive-linguistic evaluation for details) which can have an impact on his swallow function.  SLP educated Designer, television/film setN and sitter on strategies to maximize arousal and attention to PO  and to only administer PO when patient is alert and accepting of it.  Given that observed cough was minimal and delayed recommend continuation of current diet orders with close supervision and brief SLP follow-up to ensure toleration.      Aspiration Risk  Mild    Diet Recommendation Age appropriate regular solids;Thin   Medication Administration: Whole meds with puree Compensations: Follow solids with liquid;Check for pocketing;Slow rate;Small sips/bites    Other  Recommendations Oral Care Recommendations: Oral care BID Other Recommendations: Have oral suction available   Follow Up Recommendations  24/7 assist    Frequency and Duration min 2x/week  1 week   Pertinent Vitals/Pain Reports headache, unable to rate, but RN was notified    SLP Swallow Goals  see care plan for details    Swallow Study Prior Functional Status  Unknown     General Other Pertinent Information: 81 year old male from independent living facility admitted with left arm weakness, given tPA. CT head indicates suspicion of 1 or 2 small areas of evolving cortical infarct in the posterior right MCA territory. Type of Study: Bedside swallow evaluation Previous Swallow Assessment: no results in record Diet Prior to this Study: Regular;Thin liquids Temperature Spikes Noted: No Respiratory Status: Room air History of Recent Intubation: No Behavior/Cognition: Confused;Distractible;Requires cueing;Doesn't follow directions Oral Cavity - Dentition: Adequate natural dentition/normal for age Self-Feeding Abilities: Total assist Patient Positioning: Upright in bed Baseline Vocal Quality: Normal Volitional Cough: Cognitively unable to elicit Volitional Swallow: Unable to elicit    Oral/Motor/Sensory Function Overall Oral Motor/Sensory Function: Appears within functional limits for tasks assessed (grossly WFL)   Ice Chips Ice chips: Within functional limits Presentation: Spoon   Thin Liquid Thin Liquid:  Impaired Presentation: Straw Pharyngeal  Phase Impairments: Multiple swallows;Cough - Delayed Other Comments: patient consumed ~10 trials with delayed cough x1    Nectar Thick Nectar Thick Liquid: Impaired Pharyngeal Phase Impairments: Multiple swallows   Honey Thick Honey Thick Liquid: Not tested   Puree Puree: Within functional limits Presentation: Spoon   Solid   GO    Solid: Impaired Oral Phase Impairments: Other (comment) (prolonged, but functional mastication )      Fae PippinMelissa Juwaun Inskeep, M.A., CCC-SLP [phone]  Lesleigh Hughson 10/11/2015,1:26 PM

## 2015-10-11 NOTE — Telephone Encounter (Signed)
Forwarded to Dr. Paz for review/signature. JG//CMA  

## 2015-10-12 ENCOUNTER — Inpatient Hospital Stay (HOSPITAL_COMMUNITY): Payer: Medicare Other

## 2015-10-12 LAB — BASIC METABOLIC PANEL
Anion gap: 14 (ref 5–15)
BUN: 39 mg/dL — ABNORMAL HIGH (ref 6–20)
CHLORIDE: 104 mmol/L (ref 101–111)
CO2: 26 mmol/L (ref 22–32)
CREATININE: 1.94 mg/dL — AB (ref 0.61–1.24)
Calcium: 8.8 mg/dL — ABNORMAL LOW (ref 8.9–10.3)
GFR calc non Af Amer: 29 mL/min — ABNORMAL LOW (ref >60)
GFR, EST AFRICAN AMERICAN: 33 mL/min — AB (ref >60)
Glucose, Bld: 136 mg/dL — ABNORMAL HIGH (ref 65–99)
POTASSIUM: 4.4 mmol/L (ref 3.5–5.1)
SODIUM: 144 mmol/L (ref 135–145)

## 2015-10-12 LAB — GLUCOSE, CAPILLARY
GLUCOSE-CAPILLARY: 116 mg/dL — AB (ref 65–99)
GLUCOSE-CAPILLARY: 124 mg/dL — AB (ref 65–99)
GLUCOSE-CAPILLARY: 174 mg/dL — AB (ref 65–99)

## 2015-10-12 LAB — CBC
HEMATOCRIT: 34.3 % — AB (ref 39.0–52.0)
HEMOGLOBIN: 11.1 g/dL — AB (ref 13.0–17.0)
MCH: 30.2 pg (ref 26.0–34.0)
MCHC: 32.4 g/dL (ref 30.0–36.0)
MCV: 93.5 fL (ref 78.0–100.0)
Platelets: 265 10*3/uL (ref 150–400)
RBC: 3.67 MIL/uL — AB (ref 4.22–5.81)
RDW: 15.1 % (ref 11.5–15.5)
WBC: 9 10*3/uL (ref 4.0–10.5)

## 2015-10-12 MED ORDER — ENOXAPARIN SODIUM 30 MG/0.3ML ~~LOC~~ SOLN
30.0000 mg | Freq: Every day | SUBCUTANEOUS | Status: DC
  Administered 2015-10-12: 30 mg via SUBCUTANEOUS
  Filled 2015-10-12: qty 0.3

## 2015-10-12 NOTE — Progress Notes (Signed)
10/06/15 1300  OT Visit Information  Last OT Received On 10/06/15  Assistance Needed +2  PT/OT/SLP Co-Evaluation/Treatment Yes  Reason for Co-Treatment Complexity of the patient's impairments (multi-system involvement);Necessary to address cognition/behavior during functional activity;For patient/therapist safety  OT goals addressed during session ADL's and self-care;Strengthening/ROM  History of Present Illness 81 y.o. male w/ PMHx of PAF (not on anticoagulation, d/t hemorrhoids and multiple falls), CAD s/p CABG, chronic dCHF, HTN, HLD, and GERD, presented from independent living while working with OT, found to have left-sided flaccidity. Patient had improvement of left-sided symptoms but continued to have left arm weakness and mild left-sided neglect. Called as Code Stroke, CT head negative for bleed, patient given TPa.   Precautions  Precautions Fall  Home Living  Family/patient expects to be discharged to: Skilled nursing facility  Prior Function  Level of Independence Independent with assistive device(s)  Comments Used RW. States independent with bating/dressing. States a nurse rolled him to the dining room for the last 2 days PTA. unsure of accuracy.  Communication  Communication HOH  Pain Assessment  Pain Assessment Faces  Faces Pain Scale 0  Cognition  Arousal/Alertness Awake/alert  Behavior During Therapy Restless;Impulsive (Labile)  Overall Cognitive Status Impaired/Different from baseline  Area of Impairment Orientation;Attention;Memory;Following commands;Safety/judgement;Awareness;Problem solving  Orientation Level Disoriented to;Time  Current Attention Level Sustained  Memory Decreased recall of precautions;Decreased short-term memory  Following Commands Follows one step commands with increased time  Safety/Judgement Decreased awareness of safety;Decreased awareness of deficits  Awareness Intellectual  Problem Solving Slow processing;Difficulty sequencing;Requires verbal  cues;Requires tactile cues  General Comments Telling account of coming to the hospital "this morning" while working with PT.   Upper Extremity Assessment  Upper Extremity Assessment LUE deficits/detail  LUE Deficits / Details "distal strength 4/5. generalized weakness. "alien arm" symptoms. Pt states his "arm doesn't do what I want it to do ". Attempting to use functionally. poor monitoring of controlled movement patterns. Pt stes he "poked himself in the eye" with his L hand due to poor control.  LUE Sensation decreased proprioception  LUE Coordination decreased fine motor;decreased gross motor  Lower Extremity Assessment  Lower Extremity Assessment Defer to PT evaluation  Cervical / Trunk Assessment  Cervical / Trunk Assessment Kyphotic  ADL  Overall ADL's  Needs assistance/impaired  Eating/Feeding Details (indicate cue type and reason) will assess  Grooming Moderate assistance  Upper Body Bathing Moderate assistance  Lower Body Bathing Maximal assistance  Upper Body Dressing  Maximal assistance  Lower Body Dressing Total assistance  Toilet Transfer Maximal assistance;+2 for physical assistance  Toileting- Clothing Manipulation and Hygiene Total assistance  Functional mobility during ADLs Maximal assistance;+2 for physical assistance;Rolling walker;Cueing for safety;Cueing for sequencing  Vision- Assessment  Vision Assessment? Vision impaired- to be further tested in functional context  Perception  Spatial deficits poor spatial relations at times. L inattention  Bed Mobility  Overal bed mobility +2 for physical assistance;Needs Assistance  Bed Mobility Supine to Sit;Sit to Supine  Supine to sit Mod assist;+2 for physical assistance  Sit to supine Mod assist;+2 for physical assistance  Transfers  Overall transfer level Needs assistance  Equipment used Rolling walker (2 wheeled)  Transfers Sit to/from Stand  Sit to Stand +2 physical assistance;Max assist  General transfer comment  significant posterior  and L bias  Balance  Overall balance assessment Needs assistance  Sitting balance-Leahy Scale Poor  Sitting balance - Comments able to sit midlien, but affected by poor attention and leans posteriorly  Standing balance-Leahy Scale Zero  Standing balance comment posteiror/R bias  OT - End of Session  Equipment Utilized During Treatment Gait belt;Rolling walker  Activity Tolerance Patient tolerated treatment well  Patient left in bed;with call bell/phone within reach;with nursing/sitter in room;with SCD's reapplied  Nurse Communication Mobility status;Precautions  OT Assessment  OT Therapy Diagnosis  Generalized weakness;Cognitive deficits;Disturbance of vision;Apraxia  OT Recommendation/Assessment Patient needs continued OT Services  OT Problem List Decreased strength;Decreased range of motion;Decreased activity tolerance;Impaired balance (sitting and/or standing);Impaired vision/perception;Decreased coordination;Decreased cognition;Decreased safety awareness;Decreased knowledge of use of DME or AE;Impaired sensation;Impaired tone;Impaired UE functional use  OT Plan  OT Frequency (ACUTE ONLY) Min 2X/week  OT Treatment/Interventions (ACUTE ONLY) Self-care/ADL training;Therapeutic exercise;Neuromuscular education;DME and/or AE instruction;Therapeutic activities;Cognitive remediation/compensation;Visual/perceptual remediation/compensation;Patient/family education;Balance training  OT Recommendation  Follow Up Recommendations SNF;Supervision/Assistance - 24 hour  OT Equipment Other (comment) (TBA)  Individuals Consulted  Consulted and Agree with Results and Recommendations Patient unable/family or caregiver not available  Acute Rehab OT Goals  Patient Stated Goal to get some rehab  OT Goal Formulation Patient unable to participate in goal setting  Time For Goal Achievement 10/20/15  Potential to Achieve Goals Good  OT Time Calculation  OT Start Time (ACUTE ONLY) 1150   OT Stop Time (ACUTE ONLY) 1220  OT Time Calculation (min) 30 min  OT General Charges  $OT Visit 1 Procedure  OT Evaluation  $Initial OT Evaluation Tier I 1 Procedure  Written Expression  Dominant Hand Right  Luisa Dago, OTR/L  [phone] 10/06/2015

## 2015-10-12 NOTE — Progress Notes (Signed)
STROKE TEAM PROGRESS NOTE   SUBJECTIVE (INTERVAL HISTORY) Sitter discontinued yesterdayat 11am. However, found to developed AKI likely due to diuretics and dehydration. Worsening creatinine today, will increase NS and encourage fluid intake.     OBJECTIVE Temp:  [98.9 F (37.2 C)-99 F (37.2 C)] 98.9 F (37.2 C) (11/09 1016) Pulse Rate:  [74-98] 89 (11/09 1016) Cardiac Rhythm:  [-]  Resp:  [16-18] 18 (11/09 1016) BP: (100-156)/(67-96) 148/84 mmHg (11/09 1016) SpO2:  [97 %-98 %] 98 % (11/09 1016)  CBC:   Recent Labs Lab 10/05/15 1402  10/08/15 1353  10/11/15 0430 10/12/15 0248  WBC 7.1  --  7.9  < > 7.7 9.0  NEUTROABS 5.0  --  5.8  --   --   --   HGB 11.1*  < > 10.0*  < > 10.6* 11.1*  HCT 34.0*  < > 30.9*  < > 32.6* 34.3*  MCV 93.2  --  93.4  < > 92.6 93.5  PLT 250  --  202  < > 272 265  < > = values in this interval not displayed.  Basic Metabolic Panel:   Recent Labs Lab 10/11/15 0430 10/12/15 0248  NA 142 144  K 2.8* 4.4  CL 101 104  CO2 29 26  GLUCOSE 118* 136*  BUN 27* 39*  CREATININE 1.69* 1.94*  CALCIUM 8.5* 8.8*    PHYSICAL EXAM General: Alert, cooperative, NAD. Disoriented. Follows commands. Much easier to understand today HEENT: PERRL, EOMI. Moist mucus membranes. Eyes closed on exam.  Neck: Full range of motion without pain, supple, no lymphadenopathy or carotid bruits Lungs: wheezes Heart: RRR, no murmurs, gallops, or rubs Abdomen: Soft, non-tender, non-distended, BS + Extremities: No cyanosis, clubbing, or edema. Chronic venous stasis changes.   Mental Status -  Level of arousal intact, orientation to person, "hospital", month and year.  He did get the date wrong  Language exam:  Fluent  Attention span and concentration were decreased  Cranial Nerves II - XII - II - Visual field intact. III, IV, VI - Extraocular movements intact. Eye opening apraxia.  V - Facial sensation intact bilaterally. VII - Facial movement intact bilaterally. VIII  - Hearing slightly impaired bilaterally. XI - shoulder shrug intact bilaterally. XII - Tongue protrusion intact.  Motor Strength - The patient's strength was 3-4/5 in left extremities, normal on the right, pronator drift was present on the left.  Bulk was normal and fasciculations were absent. Mild left hemi-neglect.   Motor Tone - Muscle tone was assessed at the neck and appendages and was normal.  Sensory - Light touch intact  Gait:  Defered    IMAGING  Chest Xray  10/12/2015 1. Cardiac pacer stable position. Prior CABG. Stable cardiomegaly. No CHF. 2. Mild left base subsegmental atelectasis .  Ct Head Wo Contrast  10/06/2015 IMPRESSION: 1. No acute intracranial hemorrhage or mass effect. 2. Suspicion of 1 or 2 small areas of evolving cortical infarct in the posterior right MCA territory. Underlying confluent bilateral MCA white matter hypodensity which appears stable.   10/05/2015  IMPRESSION: Atrophy with diffuse small vessel disease in the supratentorial white matter is stable. Prior infarcts remains stable as noted above. No acute infarct evident. No hemorrhage or mass effect.     ASSESSMENT/PLAN [name] is a 81 y.o. male w/ PMHx of PAF (not on anticoagulation, d/t hemorrhoids and multiple falls), pacemaker, CAD s/p CABG, chronic dCHF, HTN, HLD, and GERD, admitted w/ acute onset left-sided weakness. Received IV  tPA 10/05/2015 at 1433.  Stroke:  Non-dominant suspected right MCA parietal cortical infarct thought to be embolic secondary PAF s/p IV tPA   Patient likely w/ cardioembolic etiology of stroke given h/o PAF, not on anticoagulation. H/o falls and hemorrhoids. Given quality of life and functional capacity, feel patient is good candidate for dual antiplatelet  CT head right parietal infarct  MRI: Not able to be performed d/t PPM  Carotid Doppler R 40-59% stenosis  2D Echo  EF 50-55%  LDL 101  HgbA1c 5.8  Lovenox 40 mg sq daily for VTE prophylaxis Diet  Heart Room service appropriate?: Yes; Fluid consistency:: Thin  No antithrombotic prior to admission, ASA 81 mg + Plavix 75 mg daily due to atrial fibrillation   Ongoing aggressive stroke risk factor management  Therapy recommendations:  SNF  Disposition:  To SNF. Await medical stability  Chronic Diastolic CHF  Continue Torsemide 20 mg daily  CXR 10/11/2015 - resolved pulmonary edema. 10/12/2015 no CHF  BNP 653.8 10/08/2015  Elevated creatinine, worsening  Placed on torsemide over the weekend due to CHF  But Cr 0.7->1.69 since 11/6, increased to 1.94 overnight; BUN 39  Encouraged po intake  Started NS at 50 10/10/2018. Increase rate to 75 mg/hr  CXR without CHF/edema  R LE pain  RLE venous doppler showed no DVT  Delirium  Patient likely w/ underlying mild dementia vs MCI, now w/ in hospital delirium. Following commands.  Seroquel 12.5 mg qhs  PAF  Currently NSR  Not on anticoagulation prior to admission  d/t hemorrhoids and multiple falls  ASA + Plavix   Hypertension  Stable   On the low side likely due to dehydration  Continue torsemide  Decrease coreg to 3.125mg  bid  Hyperlipidemia  LDL 101, goal < 70  Statin intolerance in the past  outpt follow up with PCP  Hypokalemia  Resolved  2.8 -> 4.4  Dysphagia  Coughing noted with sitter giving him water  On Diet Heart Room service appropriate?: Yes; Fluid consistency:: Thin  ST reassessed swallow, ok to continue current diet, meds whole in puree  Other Stroke Risk Factors  Advanced age  Hx stroke/TIA  Coronary artery disease  Anemia H/H 11.1 / 34.3  Hospital day # 7  Marvel Plan, MD PhD Stroke Neurology 10/12/2015 9:23 PM   To contact Stroke Continuity provider, please refer to [email or web address]. After hours, contact General Neurology

## 2015-10-12 NOTE — Progress Notes (Signed)
Speech Language Pathology Treatment: Dysphagia;Cognitive-Linquistic  Patient Details Name: [name] MRN: [id number] DOB: August 08, 1921 Today's Date: 10/12/2015 Time: [id number] SLP Time Calculation (min) (ACUTE ONLY): 17 min  Assessment / Plan / Recommendation Clinical Impression  Pt shows small improvements with sustained attention, and is able to attend to self-feeding task with Max multimodal cues for redirection. Immediate cough is noted x2 during self-feeding, likely due to premature spillage from poor awareness, but with HOH assist from SLP to control pacing, no further signs of aspiration are observed. Pt is able to state his name, and with both contextual cues and binary choices, he can show orientation to location and situation. With Mod cues from SLP, he recalls this information after three-minute delay. Will continue to follow.   HPI Other Pertinent Information: 81 year old male from independent living facility admitted with left arm weakness, given tPA. CT head indicates suspicion of 1 or 2 small areas of evolving cortical infarct in the posterior right MCA territory.   Pertinent Vitals Pain Assessment: Faces Faces Pain Scale: No hurt  SLP Plan  Continue with current plan of care    Recommendations Diet recommendations: Regular;Thin liquid Liquids provided via: Cup;Straw Medication Administration: Whole meds with puree Supervision: Patient able to self feed;Full supervision/cueing for compensatory strategies Compensations: Follow solids with liquid;Check for pocketing;Slow rate;Small sips/bites Postural Changes and/or Swallow Maneuvers: Seated upright 90 degrees       Oral Care Recommendations: Oral care BID Follow up Recommendations: 24 hour supervision/assistance;Skilled Nursing facility Plan: Continue with current plan of care     [name], M.A. CCC-SLP [phone]  [name] Hamaiewonsky, [name] 10/12/2015, 2:43 PM

## 2015-10-13 DIAGNOSIS — I953 Hypotension of hemodialysis: Secondary | ICD-10-CM | POA: Diagnosis not present

## 2015-10-13 DIAGNOSIS — I481 Persistent atrial fibrillation: Secondary | ICD-10-CM | POA: Diagnosis not present

## 2015-10-13 DIAGNOSIS — I4891 Unspecified atrial fibrillation: Secondary | ICD-10-CM | POA: Diagnosis not present

## 2015-10-13 DIAGNOSIS — I69320 Aphasia following cerebral infarction: Secondary | ICD-10-CM | POA: Diagnosis not present

## 2015-10-13 DIAGNOSIS — I63411 Cerebral infarction due to embolism of right middle cerebral artery: Secondary | ICD-10-CM | POA: Diagnosis not present

## 2015-10-13 DIAGNOSIS — I252 Old myocardial infarction: Secondary | ICD-10-CM | POA: Diagnosis not present

## 2015-10-13 DIAGNOSIS — Z515 Encounter for palliative care: Secondary | ICD-10-CM | POA: Diagnosis present

## 2015-10-13 DIAGNOSIS — I480 Paroxysmal atrial fibrillation: Secondary | ICD-10-CM | POA: Diagnosis not present

## 2015-10-13 DIAGNOSIS — G2581 Restless legs syndrome: Secondary | ICD-10-CM | POA: Diagnosis not present

## 2015-10-13 DIAGNOSIS — Z9842 Cataract extraction status, left eye: Secondary | ICD-10-CM | POA: Diagnosis not present

## 2015-10-13 DIAGNOSIS — I130 Hypertensive heart and chronic kidney disease with heart failure and stage 1 through stage 4 chronic kidney disease, or unspecified chronic kidney disease: Secondary | ICD-10-CM | POA: Diagnosis not present

## 2015-10-13 DIAGNOSIS — M4806 Spinal stenosis, lumbar region: Secondary | ICD-10-CM | POA: Diagnosis not present

## 2015-10-13 DIAGNOSIS — A419 Sepsis, unspecified organism: Secondary | ICD-10-CM | POA: Diagnosis not present

## 2015-10-13 DIAGNOSIS — K219 Gastro-esophageal reflux disease without esophagitis: Secondary | ICD-10-CM | POA: Diagnosis not present

## 2015-10-13 DIAGNOSIS — K210 Gastro-esophageal reflux disease with esophagitis: Secondary | ICD-10-CM | POA: Diagnosis not present

## 2015-10-13 DIAGNOSIS — Z961 Presence of intraocular lens: Secondary | ICD-10-CM | POA: Diagnosis not present

## 2015-10-13 DIAGNOSIS — D519 Vitamin B12 deficiency anemia, unspecified: Secondary | ICD-10-CM | POA: Diagnosis not present

## 2015-10-13 DIAGNOSIS — R402431 Glasgow coma scale score 3-8, in the field [EMT or ambulance]: Secondary | ICD-10-CM | POA: Diagnosis not present

## 2015-10-13 DIAGNOSIS — I5032 Chronic diastolic (congestive) heart failure: Secondary | ICD-10-CM | POA: Diagnosis not present

## 2015-10-13 DIAGNOSIS — Z79899 Other long term (current) drug therapy: Secondary | ICD-10-CM | POA: Diagnosis not present

## 2015-10-13 DIAGNOSIS — Z888 Allergy status to other drugs, medicaments and biological substances status: Secondary | ICD-10-CM | POA: Diagnosis not present

## 2015-10-13 DIAGNOSIS — E785 Hyperlipidemia, unspecified: Secondary | ICD-10-CM | POA: Diagnosis not present

## 2015-10-13 DIAGNOSIS — R4182 Altered mental status, unspecified: Secondary | ICD-10-CM | POA: Diagnosis not present

## 2015-10-13 DIAGNOSIS — Z9841 Cataract extraction status, right eye: Secondary | ICD-10-CM | POA: Diagnosis not present

## 2015-10-13 DIAGNOSIS — R41841 Cognitive communication deficit: Secondary | ICD-10-CM | POA: Diagnosis not present

## 2015-10-13 DIAGNOSIS — J9601 Acute respiratory failure with hypoxia: Secondary | ICD-10-CM | POA: Diagnosis not present

## 2015-10-13 DIAGNOSIS — Z87891 Personal history of nicotine dependence: Secondary | ICD-10-CM | POA: Diagnosis not present

## 2015-10-13 DIAGNOSIS — Z950 Presence of cardiac pacemaker: Secondary | ICD-10-CM | POA: Diagnosis not present

## 2015-10-13 DIAGNOSIS — R0989 Other specified symptoms and signs involving the circulatory and respiratory systems: Secondary | ICD-10-CM | POA: Diagnosis not present

## 2015-10-13 DIAGNOSIS — R0602 Shortness of breath: Secondary | ICD-10-CM | POA: Diagnosis not present

## 2015-10-13 DIAGNOSIS — R1312 Dysphagia, oropharyngeal phase: Secondary | ICD-10-CM | POA: Diagnosis not present

## 2015-10-13 DIAGNOSIS — I272 Other secondary pulmonary hypertension: Secondary | ICD-10-CM | POA: Diagnosis not present

## 2015-10-13 DIAGNOSIS — J690 Pneumonitis due to inhalation of food and vomit: Secondary | ICD-10-CM | POA: Diagnosis not present

## 2015-10-13 DIAGNOSIS — I69354 Hemiplegia and hemiparesis following cerebral infarction affecting left non-dominant side: Secondary | ICD-10-CM | POA: Diagnosis not present

## 2015-10-13 DIAGNOSIS — Z5189 Encounter for other specified aftercare: Secondary | ICD-10-CM | POA: Diagnosis not present

## 2015-10-13 DIAGNOSIS — R296 Repeated falls: Secondary | ICD-10-CM | POA: Diagnosis not present

## 2015-10-13 DIAGNOSIS — Z7902 Long term (current) use of antithrombotics/antiplatelets: Secondary | ICD-10-CM | POA: Diagnosis not present

## 2015-10-13 DIAGNOSIS — Z7982 Long term (current) use of aspirin: Secondary | ICD-10-CM | POA: Diagnosis not present

## 2015-10-13 DIAGNOSIS — Z951 Presence of aortocoronary bypass graft: Secondary | ICD-10-CM | POA: Diagnosis not present

## 2015-10-13 DIAGNOSIS — R278 Other lack of coordination: Secondary | ICD-10-CM | POA: Diagnosis not present

## 2015-10-13 DIAGNOSIS — L89151 Pressure ulcer of sacral region, stage 1: Secondary | ICD-10-CM | POA: Diagnosis not present

## 2015-10-13 DIAGNOSIS — I10 Essential (primary) hypertension: Secondary | ICD-10-CM | POA: Diagnosis not present

## 2015-10-13 DIAGNOSIS — R6521 Severe sepsis with septic shock: Secondary | ICD-10-CM | POA: Diagnosis not present

## 2015-10-13 DIAGNOSIS — I639 Cerebral infarction, unspecified: Secondary | ICD-10-CM | POA: Diagnosis not present

## 2015-10-13 DIAGNOSIS — Z66 Do not resuscitate: Secondary | ICD-10-CM | POA: Diagnosis not present

## 2015-10-13 DIAGNOSIS — M6281 Muscle weakness (generalized): Secondary | ICD-10-CM | POA: Diagnosis not present

## 2015-10-13 DIAGNOSIS — I509 Heart failure, unspecified: Secondary | ICD-10-CM | POA: Diagnosis not present

## 2015-10-13 DIAGNOSIS — N182 Chronic kidney disease, stage 2 (mild): Secondary | ICD-10-CM | POA: Diagnosis not present

## 2015-10-13 DIAGNOSIS — N183 Chronic kidney disease, stage 3 (moderate): Secondary | ICD-10-CM | POA: Diagnosis not present

## 2015-10-13 DIAGNOSIS — M1990 Unspecified osteoarthritis, unspecified site: Secondary | ICD-10-CM | POA: Diagnosis present

## 2015-10-13 LAB — CBC
HEMATOCRIT: 36.6 % — AB (ref 39.0–52.0)
Hemoglobin: 12 g/dL — ABNORMAL LOW (ref 13.0–17.0)
MCH: 30.6 pg (ref 26.0–34.0)
MCHC: 32.8 g/dL (ref 30.0–36.0)
MCV: 93.4 fL (ref 78.0–100.0)
Platelets: 255 10*3/uL (ref 150–400)
RBC: 3.92 MIL/uL — ABNORMAL LOW (ref 4.22–5.81)
RDW: 15.4 % (ref 11.5–15.5)
WBC: 10.7 10*3/uL — ABNORMAL HIGH (ref 4.0–10.5)

## 2015-10-13 LAB — BASIC METABOLIC PANEL
Anion gap: 11 (ref 5–15)
BUN: 36 mg/dL — ABNORMAL HIGH (ref 6–20)
CALCIUM: 8.8 mg/dL — AB (ref 8.9–10.3)
CO2: 27 mmol/L (ref 22–32)
CREATININE: 1.42 mg/dL — AB (ref 0.61–1.24)
Chloride: 106 mmol/L (ref 101–111)
GFR calc non Af Amer: 42 mL/min — ABNORMAL LOW (ref >60)
GFR, EST AFRICAN AMERICAN: 48 mL/min — AB (ref >60)
GLUCOSE: 148 mg/dL — AB (ref 65–99)
Potassium: 3.7 mmol/L (ref 3.5–5.1)
Sodium: 144 mmol/L (ref 135–145)

## 2015-10-13 LAB — GLUCOSE, CAPILLARY: GLUCOSE-CAPILLARY: 123 mg/dL — AB (ref 65–99)

## 2015-10-13 MED ORDER — QUETIAPINE FUMARATE 25 MG PO TABS: 12.5000 mg | ORAL_TABLET | Freq: Every day | ORAL | Status: AC

## 2015-10-13 MED ORDER — ENSURE ENLIVE PO LIQD: 237.0000 mL | Freq: Two times a day (BID) | ORAL | Status: AC

## 2015-10-13 MED ORDER — ENOXAPARIN SODIUM 40 MG/0.4ML ~~LOC~~ SOLN: 40.0000 mg | Freq: Every day | SUBCUTANEOUS | Status: DC

## 2015-10-13 MED ORDER — TORSEMIDE 20 MG PO TABS: ORAL_TABLET | ORAL | Status: AC

## 2015-10-13 MED ORDER — ASPIRIN 81 MG PO TBEC: 81.0000 mg | DELAYED_RELEASE_TABLET | Freq: Every day | ORAL | Status: AC

## 2015-10-13 MED ORDER — CLOPIDOGREL BISULFATE 75 MG PO TABS: 75.0000 mg | ORAL_TABLET | Freq: Every day | ORAL | Status: AC

## 2015-10-13 NOTE — Progress Notes (Signed)
Discharge orders received, [facility]. IV d/c'd. D/c instructions and RX placed in packet for facility. PTAR transferred the Patient.  10/13/15 1504

## 2015-10-13 NOTE — Discharge Summary (Signed)
Stroke Discharge Summary  Patient ID: reakwon barren   MRN: [id number]      DOB: 19-Jul-1921  Date of Admission: 10/05/2015 Date of Discharge: 10/13/2015  Attending Physician:  Marvel Plan, MD, Stroke MD  Patient's PCP:  Willow Ora, MD  DISCHARGE DIAGNOSIS:  1. Stroke: Non-dominant suspected right MCA parietal cortical infarct thought to be embolic secondary PAF s/p IV tPA  2. Chronic Diastolic CHF 3. Elevated creatinine 4. R LE pain 5. Delirium 6. PAF 7. Hypertension 8. Hyperlipidemia 9. Hypokalemia 10. Dysphagia 11. Hx stroke/TIA 12. Hx Coronary artery disease 13. Anemia H/H 11.1 / 34.3. stable 14. Pressure ulcer 15. Pulmonary vascular congestion 16. Restless 17. Wheezing  Past Medical History  Diagnosis Date  . A-fib (HCC)     paroxysmal, s/p TEE-DCCV 03/2013;  was on coumadin , pt desired to stop it , no longer a candidate per cards (hemorrhoidal bleeding; falls)  . CAD (coronary artery disease)     a. MI angioplasty 93, b. s/p CABG 01/2006;   c.  NSTEMI 06/2010 - [facility]:  L-LAD ok, S-Dx ok, S-OM ok, S-PDA ok, small OM sub total occluded - Med Rx  . Chronic diastolic heart failure (HCC)   . Hyperlipidemia   . Hypertension   . Spinal stenosis      has seen Dr Ethelene Hal before, s/p local injection which helped x a while   . GERD (gastroesophageal reflux disease)   . RLS (restless legs syndrome)   . Insomnia   . History of BPH     TURP '96 at [facility] 96, urethoplast 97 ( History of bladder outlet obstruction, chronic incontinence)  . B12 deficiency anemia   . Myocardial infarction (HCC) 01/1992; 06/2010  . Arthritis   . Hx of cardiovascular stress test     a.  Myoview (10/2005):  EF 62%, mild apical ischemia  . Hx of echocardiogram     a.  Echo (03/23/13):  mod LVH, EF 50-55%, no WMA, mild MR, mod LAE, mild RVE, mod RAE, mod TR   Past Surgical History  Procedure Laterality Date  . Coronary artery bypass graft      "CABG X4" (05/14/2013)  . Cystoscopy with urethral  dilatation  1997    "fixed strictures from TURP" (05/14/2013)  . Inguinal hernia repair Right   . Carpal tunnel release Bilateral ~ 2005    Dr Teressa Senter  . Pacemaker insertion  12/2006    MDT dual chamber PPM implanted for SSS  . Tee without cardioversion N/A 03/27/2013    Procedure: TRANSESOPHAGEAL ECHOCARDIOGRAM (TEE);  Surgeon: Lewayne Bunting, MD;  Location: [facility] ENDOSCOPY;  Service: Cardiovascular;  Laterality: N/A;  . Cardioversion N/A 03/27/2013    Procedure: CARDIOVERSION;  Surgeon: Lewayne Bunting, MD;  Location: [facility] ENDOSCOPY;  Service: Cardiovascular;  Laterality: N/A;  . Total knee arthroplasty Bilateral     right 2005, Left 2006.  Dr  Despina Hick  . Coronary angioplasty      "Dr. Riley Kill"  . Cataract extraction w/ intraocular lens  implant, bilateral Bilateral 2012  . Transurethral resection of prostate  1996  . Ep implantable device N/A 06/24/2015    Procedure:  PPM Generator Changeout;  Surgeon: [facility] Salvia, MD;  Location[facility] INVASIVE CV LAB;  Service: Cardiovascular;  Laterality: N/A;  . Ep implantable device N/A 06/27/2015    Procedure: PPM Generator Changeout;  Surgeon: [facility] Salvia, MD;  Location: [facility] INVASIVE CV LAB;  Service: Cardiovascular;  Laterality: N/A;  Medication List    STOP taking these medications        acetaminophen 500 MG tablet  Commonly known as:  TYLENOL      TAKE these medications        aspirin 81 MG EC tablet  Take 1 tablet (81 mg total) by mouth daily.     B-complex with vitamin C tablet  Take 1 tablet by mouth daily.     beta carotene w/minerals tablet  Take 1 tablet by mouth daily.     carvedilol 6.25 MG tablet  Commonly known as:  COREG  Take 1 tablet (6.25 mg total) by mouth 2 (two) times daily with a meal.     clopidogrel 75 MG tablet  Commonly known as:  PLAVIX  Take 1 tablet (75 mg total) by mouth daily.     feeding supplement (ENSURE ENLIVE) Liqd  Take 237 mLs by mouth 2 (two) times daily between meals.     potassium  chloride 10 MEQ CR capsule  Commonly known as:  MICRO-K  TAKE ONE CAPSULE BY MOUTH DAILY ALTERNATING WITH TWO CAPSULES BY MOUTH DAILY     pramipexole 0.125 MG tablet  Commonly known as:  MIRAPEX  Take 1 tablet (0.125 mg total) by mouth 3 (three) times daily.     QUEtiapine 25 MG tablet  Commonly known as:  SEROQUEL  Take 0.5 tablets (12.5 mg total) by mouth at bedtime.     torsemide 20 MG tablet  Commonly known as:  DEMADEX  TAKE 0.5 TAB (10MG  TOTAL) BY MOUTH DAILY.        LABORATORY STUDIES CBC    Component Value Date/Time   WBC 10.7* 10/13/2015 0417   RBC 3.92* 10/13/2015 0417   HGB 12.0* 10/13/2015 0417   HCT 36.6* 10/13/2015 0417   PLT 255 10/13/2015 0417   MCV 93.4 10/13/2015 0417   MCH 30.6 10/13/2015 0417   MCHC 32.8 10/13/2015 0417   RDW 15.4 10/13/2015 0417   LYMPHSABS 1.3 10/08/2015 1353   MONOABS 0.6 10/08/2015 1353   EOSABS 0.2 10/08/2015 1353   BASOSABS 0.0 10/08/2015 1353   CMP    Component Value Date/Time   NA 144 10/13/2015 0417   K 3.7 10/13/2015 0417   CL 106 10/13/2015 0417   CO2 27 10/13/2015 0417   GLUCOSE 148* 10/13/2015 0417   GLUCOSE 91 12/16/2006 0953   BUN 36* 10/13/2015 0417   CREATININE 1.42* 10/13/2015 0417   CALCIUM 8.8* 10/13/2015 0417   PROT 7.5 10/05/2015 1402   ALBUMIN 3.3* 10/05/2015 1402   AST 95* 10/05/2015 1402   ALT 75* 10/05/2015 1402   ALKPHOS 343* 10/05/2015 1402   BILITOT 1.0 10/05/2015 1402   GFRNONAA 42* 10/13/2015 0417   GFRAA 48* 10/13/2015 0417   COAGS Lab Results  Component Value Date   INR 1.07 10/05/2015   INR 1.0 06/13/2015   INR 1.17 03/23/2013   Lipid Panel    Component Value Date/Time   CHOL 145 10/06/2015 0344   TRIG 75 10/06/2015 0344   HDL 29* 10/06/2015 0344   CHOLHDL 5.0 10/06/2015 0344   VLDL 15 10/06/2015 0344   LDLCALC 101* 10/06/2015 0344   HgbA1C  Lab Results  Component Value Date   HGBA1C 5.8* 10/06/2015   Cardiac Panel (last 3 results) No results for input(s): CKTOTAL,  CKMB, TROPONINI, RELINDX in the last 72 hours. Urinalysis    Component Value Date/Time   COLORURINE YELLOW 01/02/2015 2123   APPEARANCEUR CLEAR 01/02/2015 2123  LABSPEC 1.010 01/02/2015 2123   PHURINE 6.0 01/02/2015 2123   GLUCOSEU NEGATIVE 01/02/2015 2123   HGBUR NEGATIVE 01/02/2015 2123   BILIRUBINUR NEGATIVE 01/02/2015 2123   KETONESUR NEGATIVE 01/02/2015 2123   PROTEINUR NEGATIVE 01/02/2015 2123   UROBILINOGEN 0.2 01/02/2015 2123   NITRITE NEGATIVE 01/02/2015 2123   LEUKOCYTESUR NEGATIVE 01/02/2015 2123   Urine Drug Screen No results found for: LABOPIA, COCAINSCRNUR, LABBENZ, AMPHETMU, THCU, LABBARB  Alcohol Level No results found for: [facility]   SIGNIFICANT DIAGNOSTIC STUDIES Ct Head Wo Contrast 10/06/2015  1. No acute intracranial hemorrhage or mass effect. 2. Suspicion of 1 or 2 small areas of evolving cortical infarct in the posterior right MCA territory. Underlying confluent bilateral MCA white matter hypodensity which appears stable.  10/05/2015  Atrophy with diffuse small vessel disease in the supratentorial white matter is stable. Prior infarcts remains stable as noted above. No acute infarct evident. No hemorrhage or mass effect.   Dg Chest Port 1 View 10/12/2015  1. Cardiac pacer stable position. Prior CABG. Stable cardiomegaly. No CHF. 2. Mild left base subsegmental atelectasis .  10/10/2015  Stable mild cardiac enlargement without pulmonary edema. There is no acute cardiopulmonary abnormality.  10/09/2015  Cardiomegaly.  No acute cardiopulmonary process.  10/09/2015  1. Cardiomegaly with pulmonary venous congestion, but no frank pulmonary edema. 2. Atherosclerosis. 3. Postoperative changes and support apparatus 10/08/2015  1. Cardiac enlargement and pulmonary vascular congestion.       HISTORY OF PRESENT ILLNESS Mr. YASIEL GOYNE is a 81 y.o. male w/ PMHx of PAF (not on anticoagulation, d/t hemorrhoids and multiple falls), CAD s/p CABG, chronic dCHF, HTN, HLD, and GERD,  presented from independent living while working with OT, found to have left-sided flaccidity. Patient had improvement of left-sided symptoms but continued to have left arm weakness and mild left-sided neglect. Called as Code Stroke, CT head negative for bleed, patient given tPA. He was admitted to the neuro ICU for further evaluation and treatment.    HOSPITAL COURSE Mr. AKSHITH MONCUS is a 81 y.o. male w/ PMHx of PAF (not on anticoagulation, d/t hemorrhoids and multiple falls), pacemaker, CAD s/p CABG, chronic dCHF, HTN, HLD, and GERD, admitted w/ acute onset left-sided weakness. Received IV tPA 10/05/2015 at 1433.  Stroke: Non-dominant suspected right MCA parietal cortical infarct thought to be embolic secondary PAF s/p IV tPA   Patient likely w/ cardioembolic etiology of stroke given h/o PAF, not on anticoagulation. H/o falls and hemorrhoids. Given quality of life and functional capacity, feel patient is good candidate for dual antiplatelet  CT head right parietal infarct  MRI: Not able to be performed d/t PPM  Carotid Doppler R 40-59% stenosis  2D Echo EF 50-55%  LDL 101  HgbA1c 5.8  No antithrombotic prior to admission, ASA 81 mg + Plavix 75 mg daily due to atrial fibrillation   Ongoing aggressive stroke risk factor management  Therapy recommendations: SNF  Disposition: To SNF  Chronic Diastolic CHF  Continue Torsemide 20 mg daily  CXR 10/11/2015 - resolved pulmonary edema. 10/12/2015 no CHF  BNP 653.8 10/08/2015  Elevated creatinine, improving  Placed on torsemide over the weekend due to CHF  But Cr 0.7->1.69 since 11/6, increased to 1.94 -> 1.42  Encouraged po intake and treated with IV fluids  CXR without CHF/edema  R LE pain  RLE venous doppler showed no DVT  Delirium  Patient likely w/ underlying mild dementia vs MCI, now w/ in hospital delirium. Following commands.  Stable on Seroquel 12.5  mg qhs  PAF  Currently NSR  Not on anticoagulation  prior to admission d/t hemorrhoids and multiple falls  ASA + Plavix  Hypertension  Stable   Continue torsemide  Decrease coreg to 3.125mg  bid  Hyperlipidemia  LDL 101, goal < 70  Statin intolerance in the past  outpt follow up   Hypokalemia  Resolved  2.8 -> 4.4->3.7  Resume home K at discharge  Dysphagia  Coughing noted with sitter giving him water  On Diet Heart Room service appropriate?: Yes; Fluid consistency:: Thin  ST reassessed swallow, ok to continue current diet, meds whole in puree  Other Stroke Risk Factors  Advanced age  Hx stroke/TIA  Coronary artery disease  Anemia H/H 11.1 / 34.3    DISCHARGE EXAM Blood pressure 160/60, pulse 76, temperature 99.8 F (37.7 C), temperature source Axillary, resp. rate 20, height 5\' 10"  (1.778 m), weight 88 kg (194 lb 0.1 oz), SpO2 98 %. General: Alert, cooperative, NAD. Disoriented. Follows commands. Much easier to understand today HEENT: PERRL, EOMI. Moist mucus membranes. Eyes closed on exam.  Neck: Full range of motion without pain, supple, no lymphadenopathy or carotid bruits Lungs: wheezes Heart: RRR, no murmurs, gallops, or rubs Abdomen: Soft, non-tender, non-distended, BS + Extremities: No cyanosis, clubbing, or edema. Chronic venous stasis changes.   Mental Status -  Level of arousal intact, orientation to person, "hospital", month and year. He did get the date wrong  Language exam: Fluent  Attention span and concentration were decreased  Cranial Nerves II - XII - II - Visual field intact. III, IV, VI - Extraocular movements intact. Eye opening apraxia.  V - Facial sensation intact bilaterally. VII - Facial movement intact bilaterally. VIII - Hearing slightly impaired bilaterally. XI - shoulder shrug intact bilaterally. XII - Tongue protrusion intact.  Motor Strength - The patient's strength was 3-4/5 in left extremities, normal on the right, pronator drift was present on the left. Bulk  was normal and fasciculations were absent. Mild left hemi-neglect.  Motor Tone - Muscle tone was assessed at the neck and appendages and was normal.  Sensory - Light touch intact  Gait: Defered    Discharge Diet   Diet Heart Room service appropriate?: Yes; Fluid consistency:: Thin liquids  DISCHARGE PLAN  Disposition:  SNF   aspirin 81 mg daily and clopidogrel 75 mg daily for secondary stroke prevention.  Follow-up Willow Ora, MD in 2 weeks.  Follow-up with Dr. Marvel Plan, Stroke Clinic in 2 months.  45 minutes were spent preparing discharge.  Rhoderick Moody [facility] See Amion for Pager information 10/13/2015 1:40 PM   I, the attending vascular neurologist, have personally obtained a history, examined the patient, evaluated laboratory data, individually viewed imaging studies and agree with radiology interpretations. I also discussed with daughter at the bedside regarding his care plan. Together with the NP/PA, we formulated the assessment and plan of care which reflects our mutual decision.  I have made any additions or clarifications directly to the above note and agree with the findings and plan as currently documented.    Marvel Plan, MD PhD Stroke Neurology 10/14/2015 7:17 AM

## 2015-10-13 NOTE — Progress Notes (Signed)
Physical Therapy Treatment Patient Details Name: [name] MRN: [id number] DOB: 1921-09-21 Today's Date: 10/13/2015    History of Present Illness 81 y.o. male w/ PMHx of PAF (not on anticoagulation, d/t hemorrhoids and multiple falls), CAD s/p CABG, chronic dCHF, HTN, HLD, and GERD, presented from independent living while working with OT, found to have left-sided flaccidity. Patient had improvement of left-sided symptoms but continued to have left arm weakness and mild left-sided neglect. Called as Code Stroke, CT head negative for bleed, patient given TPa.     PT Comments    Slow progress.  Emphasis on transitions to EOB, sitting balance and standing balance/tolerance.  Follow Up Recommendations  SNF     Equipment Recommendations  None recommended by PT    Recommendations for Other Services       Precautions / Restrictions Precautions Precautions: Fall    Mobility  Bed Mobility Overal bed mobility: Needs Assistance;+2 for physical assistance Bed Mobility: Supine to Sit;Sit to Supine     Supine to sit: Max assist;+2 for physical assistance Sit to supine: Max assist;+2 for physical assistance   General bed mobility comments: assisted trunk upright due to pt stiff and fearful of falling and needs assist for confidence.  Transfers Overall transfer level: Needs assistance Equipment used: 2 person hand held assist Transfers: Sit to/from Stand Sit to Stand: Max assist;+2 physical assistance         General transfer comment: used face to face transfer to assist pt more upright and assisted maintaining weight bearing while bed was changed and staightened.  Ambulation/Gait                 Stairs            Wheelchair Mobility    Modified Rankin (Stroke Patients Only) Modified Rankin (Stroke Patients Only) Pre-Morbid Rankin Score: Moderately severe disability Modified Rankin: Severe disability     Balance Overall balance assessment: Needs  assistance Sitting-balance support: Feet supported;Single extremity supported;Bilateral upper extremity supported Sitting balance-Leahy Scale: Poor Sitting balance - Comments: Lists off to the Right consistently though slowly,  unable to coming into midline without assist     Standing balance-Leahy Scale: Zero                      Cognition Arousal/Alertness: Lethargic;Awake/alert Behavior During Therapy: Flat affect Overall Cognitive Status: Impaired/Different from baseline Area of Impairment: Orientation;Attention;Memory;Following commands;Safety/judgement;Awareness;Problem solving Orientation Level: Time Current Attention Level: Sustained Memory: Decreased recall of precautions;Decreased short-term memory Following Commands: Follows one step commands with increased time Safety/Judgement: Decreased awareness of safety;Decreased awareness of deficits Awareness: Intellectual Problem Solving: Slow processing;Difficulty sequencing;Requires verbal cues;Requires tactile cues      Exercises      General Comments        Pertinent Vitals/Pain Pain Assessment: Faces Faces Pain Scale: No hurt    Home Living                      Prior Function            PT Goals (current goals can now be found in the care plan section) Acute Rehab PT Goals Patient Stated Goal: to get some rehab PT Goal Formulation: With patient Time For Goal Achievement: 10/20/15 Potential to Achieve Goals: Fair Progress towards PT goals: Not progressing toward goals - comment (little change from last treatment)    Frequency  Min 3X/week    PT Plan Current plan remains appropriate  Co-evaluation             End of Session   Activity Tolerance: Patient limited by fatigue Patient left: in bed;with call bell/phone within reach     Time: 1352-1418 PT Time Calculation (min) (ACUTE ONLY): 26 min  Charges:  $Therapeutic Activity: 23-37 mins                    G Codes:       Roger Fasnacht, Eliseo Gum 10/13/2015, 5:40 PM 10/13/2015  Nebo Bing, PT [phone]  (pager)

## 2015-10-14 ENCOUNTER — Telehealth: Payer: Self-pay | Admitting: Behavioral Health

## 2015-10-14 NOTE — Telephone Encounter (Signed)
Per telephone note on 10/11/15, patient is at [facility] (SNF[facility] Attempted to speak with the patient's wife to possibly schedule an appointment in two weeks with PCP, once the patient has been discharged from the SNF; unable to reach family member regarding TCM/Hospital Follow-up. Per the recording on the mobile line, a remote access code has to be provided; message could not be left. Also, the home number  given is no longer in service.

## 2015-10-17 ENCOUNTER — Inpatient Hospital Stay (HOSPITAL_COMMUNITY)
Admission: EM | Admit: 2015-10-17 | Discharge: 2015-11-03 | DRG: 871 | Disposition: E | Payer: Medicare Other | Attending: Internal Medicine | Admitting: Internal Medicine

## 2015-10-17 ENCOUNTER — Emergency Department (HOSPITAL_COMMUNITY): Payer: Medicare Other

## 2015-10-17 ENCOUNTER — Telehealth: Payer: Self-pay | Admitting: Internal Medicine

## 2015-10-17 DIAGNOSIS — Z950 Presence of cardiac pacemaker: Secondary | ICD-10-CM

## 2015-10-17 DIAGNOSIS — I480 Paroxysmal atrial fibrillation: Secondary | ICD-10-CM | POA: Diagnosis present

## 2015-10-17 DIAGNOSIS — I4891 Unspecified atrial fibrillation: Secondary | ICD-10-CM | POA: Diagnosis present

## 2015-10-17 DIAGNOSIS — R4182 Altered mental status, unspecified: Secondary | ICD-10-CM | POA: Diagnosis present

## 2015-10-17 DIAGNOSIS — I481 Persistent atrial fibrillation: Secondary | ICD-10-CM | POA: Diagnosis not present

## 2015-10-17 DIAGNOSIS — Z66 Do not resuscitate: Secondary | ICD-10-CM | POA: Diagnosis not present

## 2015-10-17 DIAGNOSIS — N189 Chronic kidney disease, unspecified: Secondary | ICD-10-CM | POA: Diagnosis present

## 2015-10-17 DIAGNOSIS — Z961 Presence of intraocular lens: Secondary | ICD-10-CM | POA: Diagnosis present

## 2015-10-17 DIAGNOSIS — Z951 Presence of aortocoronary bypass graft: Secondary | ICD-10-CM

## 2015-10-17 DIAGNOSIS — R6521 Severe sepsis with septic shock: Secondary | ICD-10-CM | POA: Diagnosis not present

## 2015-10-17 DIAGNOSIS — N183 Chronic kidney disease, stage 3 (moderate): Secondary | ICD-10-CM | POA: Diagnosis not present

## 2015-10-17 DIAGNOSIS — I639 Cerebral infarction, unspecified: Secondary | ICD-10-CM | POA: Diagnosis present

## 2015-10-17 DIAGNOSIS — I5030 Unspecified diastolic (congestive) heart failure: Secondary | ICD-10-CM | POA: Diagnosis present

## 2015-10-17 DIAGNOSIS — M1990 Unspecified osteoarthritis, unspecified site: Secondary | ICD-10-CM | POA: Diagnosis present

## 2015-10-17 DIAGNOSIS — Z9841 Cataract extraction status, right eye: Secondary | ICD-10-CM

## 2015-10-17 DIAGNOSIS — J9601 Acute respiratory failure with hypoxia: Secondary | ICD-10-CM | POA: Diagnosis not present

## 2015-10-17 DIAGNOSIS — I69320 Aphasia following cerebral infarction: Secondary | ICD-10-CM

## 2015-10-17 DIAGNOSIS — N182 Chronic kidney disease, stage 2 (mild): Secondary | ICD-10-CM | POA: Diagnosis not present

## 2015-10-17 DIAGNOSIS — Z7982 Long term (current) use of aspirin: Secondary | ICD-10-CM

## 2015-10-17 DIAGNOSIS — Z515 Encounter for palliative care: Secondary | ICD-10-CM | POA: Diagnosis present

## 2015-10-17 DIAGNOSIS — Z888 Allergy status to other drugs, medicaments and biological substances status: Secondary | ICD-10-CM | POA: Diagnosis not present

## 2015-10-17 DIAGNOSIS — R296 Repeated falls: Secondary | ICD-10-CM | POA: Diagnosis not present

## 2015-10-17 DIAGNOSIS — A419 Sepsis, unspecified organism: Secondary | ICD-10-CM | POA: Diagnosis not present

## 2015-10-17 DIAGNOSIS — I130 Hypertensive heart and chronic kidney disease with heart failure and stage 1 through stage 4 chronic kidney disease, or unspecified chronic kidney disease: Secondary | ICD-10-CM | POA: Diagnosis present

## 2015-10-17 DIAGNOSIS — Z87891 Personal history of nicotine dependence: Secondary | ICD-10-CM | POA: Diagnosis not present

## 2015-10-17 DIAGNOSIS — Z79899 Other long term (current) drug therapy: Secondary | ICD-10-CM

## 2015-10-17 DIAGNOSIS — L89151 Pressure ulcer of sacral region, stage 1: Secondary | ICD-10-CM | POA: Diagnosis not present

## 2015-10-17 DIAGNOSIS — I5032 Chronic diastolic (congestive) heart failure: Secondary | ICD-10-CM | POA: Diagnosis present

## 2015-10-17 DIAGNOSIS — D519 Vitamin B12 deficiency anemia, unspecified: Secondary | ICD-10-CM | POA: Diagnosis not present

## 2015-10-17 DIAGNOSIS — R402431 Glasgow coma scale score 3-8, in the field [EMT or ambulance]: Secondary | ICD-10-CM | POA: Diagnosis not present

## 2015-10-17 DIAGNOSIS — J9690 Respiratory failure, unspecified, unspecified whether with hypoxia or hypercapnia: Secondary | ICD-10-CM | POA: Diagnosis present

## 2015-10-17 DIAGNOSIS — G2581 Restless legs syndrome: Secondary | ICD-10-CM | POA: Diagnosis present

## 2015-10-17 DIAGNOSIS — I252 Old myocardial infarction: Secondary | ICD-10-CM

## 2015-10-17 DIAGNOSIS — J690 Pneumonitis due to inhalation of food and vomit: Secondary | ICD-10-CM | POA: Diagnosis not present

## 2015-10-17 DIAGNOSIS — E785 Hyperlipidemia, unspecified: Secondary | ICD-10-CM | POA: Diagnosis present

## 2015-10-17 DIAGNOSIS — K219 Gastro-esophageal reflux disease without esophagitis: Secondary | ICD-10-CM | POA: Diagnosis present

## 2015-10-17 DIAGNOSIS — Z7902 Long term (current) use of antithrombotics/antiplatelets: Secondary | ICD-10-CM

## 2015-10-17 DIAGNOSIS — Z9842 Cataract extraction status, left eye: Secondary | ICD-10-CM

## 2015-10-17 DIAGNOSIS — L8990 Pressure ulcer of unspecified site, unspecified stage: Secondary | ICD-10-CM | POA: Diagnosis present

## 2015-10-17 DIAGNOSIS — R0602 Shortness of breath: Secondary | ICD-10-CM | POA: Diagnosis not present

## 2015-10-17 DIAGNOSIS — I272 Other secondary pulmonary hypertension: Secondary | ICD-10-CM | POA: Diagnosis present

## 2015-10-17 LAB — CBG MONITORING, ED: GLUCOSE-CAPILLARY: 123 mg/dL — AB (ref 65–99)

## 2015-10-17 MED ORDER — MORPHINE SULFATE (PF) 2 MG/ML IV SOLN
2.0000 mg | INTRAVENOUS | Status: DC | PRN
  Administered 2015-10-17 – 2015-10-18 (×7): 2 mg via INTRAVENOUS
  Filled 2015-10-17 (×7): qty 1

## 2015-10-17 MED ORDER — SCOPOLAMINE 1 MG/3DAYS TD PT72
1.0000 | MEDICATED_PATCH | TRANSDERMAL | Status: DC
  Administered 2015-10-17: 1.5 mg via TRANSDERMAL
  Filled 2015-10-17 (×2): qty 1

## 2015-10-17 MED ORDER — ACETAMINOPHEN 325 MG PO TABS: 650.0000 mg | ORAL_TABLET | Freq: Four times a day (QID) | ORAL | Status: DC | PRN

## 2015-10-17 MED ORDER — ONDANSETRON HCL 4 MG PO TABS: 4.0000 mg | ORAL_TABLET | Freq: Four times a day (QID) | ORAL | Status: DC | PRN

## 2015-10-17 MED ORDER — ACETAMINOPHEN 650 MG RE SUPP
650.0000 mg | Freq: Four times a day (QID) | RECTAL | Status: DC | PRN
  Administered 2015-10-17: 650 mg via RECTAL
  Filled 2015-10-17: qty 1

## 2015-10-17 MED ORDER — ACETAMINOPHEN 650 MG RE SUPP
650.0000 mg | Freq: Once | RECTAL | Status: AC
  Administered 2015-10-17: 650 mg via RECTAL
  Filled 2015-10-17: qty 1

## 2015-10-17 MED ORDER — BISACODYL 10 MG RE SUPP: 10.0000 mg | Freq: Every day | RECTAL | Status: DC | PRN

## 2015-10-17 MED ORDER — ONDANSETRON HCL 4 MG/2ML IJ SOLN: 4.0000 mg | Freq: Four times a day (QID) | INTRAMUSCULAR | Status: DC | PRN

## 2015-10-17 MED ORDER — LORAZEPAM 2 MG/ML IJ SOLN
0.5000 mg | Freq: Once | INTRAMUSCULAR | Status: AC
  Administered 2015-10-17: 0.5 mg via INTRAVENOUS
  Filled 2015-10-17: qty 1

## 2015-10-17 MED ORDER — LORAZEPAM 2 MG/ML IJ SOLN: 0.5000 mg | INTRAMUSCULAR | Status: DC | PRN

## 2015-10-17 NOTE — ED Notes (Signed)
Admit Doctor at bedside.  

## 2015-10-17 NOTE — Telephone Encounter (Signed)
Will forward to Device Clinic. 

## 2015-10-17 NOTE — ED Notes (Signed)
Doctor spoke with patient's wife and daughter at bedside. Plan will be for comfort care.

## 2015-10-17 NOTE — Telephone Encounter (Signed)
New Message  Pt wife calling to let our office aware that pt has had a stroke is in [place] [facility] and is unable to send remote transmission for now. Pt wife wanted to know how to proceed. Please call back and discuss.

## 2015-10-17 NOTE — ED Notes (Signed)
Wife and daughter at bedside states patient since the stroke first week of November patient unable to follow commands and intermittently knows family.  States difficulty breathing noticed 2 days ago and daughter stated patient having difficulty swallowing foods.

## 2015-10-17 NOTE — ED Notes (Signed)
Additional family arrived at patients bedside.

## 2015-10-17 NOTE — H&P (Signed)
[facility] History and Physical                                                                                    [name], is a 81 y.o. male  MRN: [id number]   DOB - 1921/08/06  Admit Date - 10/24/2015  Outpatient Primary MD for the patient is Willow Ora, MD  Referring Physician:  Dr. Madilyn Hook.  Chief Complaint:   Chief Complaint  Patient presents with  . Altered Mental Status     HPI  [name]  is a 81 y.o. male, with atrial fibrillation, chronic diastolic heart failure, chronic kidney disease, frequent falls, who had a right MCA infarct on 11/2 presents to the emergency department today was not responsive and in acute respiratory failure. His wife reports after discharge from [facility] on Friday 11/10 he was admitted to [facility] facility. On Saturday morning he was not responsive and has been that way since. His labored breathing started on Saturday as well. His daughter reports that he seemed to choke when eating small amounts of food. In the emergency department his temperature is 103.7 respirations are between 37 and 43, systolic blood pressures in the 60s.  [facility] with that this patient with overwhelming septic shock comfort measures. He is not expected to survive much longer.  Review of systems: Unable to obtain as the patient is not at positive.  Past Medical History  Past Medical History  Diagnosis Date  . A-fib (HCC)     paroxysmal, s/p TEE-DCCV 03/2013;  was on coumadin , pt desired to stop it , no longer a candidate per cards (hemorrhoidal bleeding; falls)  . CAD (coronary artery disease)     a. MI angioplasty 93, b. s/p CABG 01/2006;   c.  NSTEMI 06/2010 - [facility]:  L-LAD ok, S-Dx ok, S-OM ok, S-PDA ok, small OM sub total occluded - Med Rx  . Chronic diastolic heart failure (HCC)   . Hyperlipidemia   . Hypertension   . Spinal stenosis      has seen Dr Ethelene Hal before, s/p local injection which helped x a while   . GERD (gastroesophageal  reflux disease)   . RLS (restless legs syndrome)   . Insomnia   . History of BPH     TURP '96 at [facility] 96, urethoplast 97 ( History of bladder outlet obstruction, chronic incontinence)  . B12 deficiency anemia   . Myocardial infarction (HCC) 01/1992; 06/2010  . Arthritis   . Hx of cardiovascular stress test     a.  Myoview (10/2005):  EF 62%, mild apical ischemia  . Hx of echocardiogram     a.  Echo (03/23/13):  mod LVH, EF 50-55%, no WMA, mild MR, mod LAE, mild RVE, mod RAE, mod TR    Past Surgical History  Procedure Laterality Date  . Coronary artery bypass graft      "CABG X4" (05/14/2013)  . Cystoscopy with urethral dilatation  1997    "fixed strictures from TURP" (05/14/2013)  . Inguinal hernia repair Right   . Carpal tunnel release Bilateral ~ 2005    Dr Teressa Senter  . Pacemaker insertion  12/2006  MDT dual chamber PPM implanted for SSS  . Tee without cardioversion N/A 03/27/2013    Procedure: TRANSESOPHAGEAL ECHOCARDIOGRAM (TEE);  Surgeon: Lewayne Bunting, MD;  Location: [facility] ENDOSCOPY;  Service: Cardiovascular;  Laterality: N/A;  . Cardioversion N/A 03/27/2013    Procedure: CARDIOVERSION;  Surgeon: Lewayne Bunting, MD;  Location: [facility] ENDOSCOPY;  Service: Cardiovascular;  Laterality: N/A;  . Total knee arthroplasty Bilateral     right 2005, Left 2006.  Dr  Despina Hick  . Coronary angioplasty      "Dr. Riley Kill"  . Cataract extraction w/ intraocular lens  implant, bilateral Bilateral 2012  . Transurethral resection of prostate  1996  . Ep implantable device N/A 06/24/2015    Procedure:  PPM Generator Changeout;  Surgeon: [facility] Salvia, MD;  Location: [facility] INVASIVE CV LAB;  Service: Cardiovascular;  Laterality: N/A;  . Ep implantable device N/A 06/27/2015    Procedure: PPM Generator Changeout;  Surgeon: [facility] Salvia, MD;  Location: [facility] INVASIVE CV LAB;  Service: Cardiovascular;  Laterality: N/A;      Social History Social History  Substance Use Topics  . Smoking status: Former Smoker --  2.00 packs/day for 15 years    Types: Cigarettes    Quit date: 02/01/1959  . Smokeless tobacco: Never Used  . Alcohol Use: No   married.   Family History Family History  Problem Relation Age of Onset  . Heart disease Neg Hx   . Diabetes Neg Hx   . Stroke Mother   . Cancer Father     Prior to Admission medications   Medication Sig Start Date End Date Taking? Authorizing Provider  aspirin EC 81 MG EC tablet Take 1 tablet (81 mg total) by mouth daily. 10/13/15   Layne Benton, NP  B Complex-C (B-COMPLEX WITH VITAMIN C) tablet Take 1 tablet by mouth daily.    Historical Provider, MD  beta carotene w/minerals (OCUVITE) tablet Take 1 tablet by mouth daily.    Historical Provider, MD  carvedilol (COREG) 6.25 MG tablet Take 1 tablet (6.25 mg total) by mouth 2 (two) times daily with a meal. 04/12/15   Wanda Plump, MD  clopidogrel (PLAVIX) 75 MG tablet Take 1 tablet (75 mg total) by mouth daily. 10/13/15   Layne Benton, NP  feeding supplement, ENSURE ENLIVE, (ENSURE ENLIVE) LIQD Take 237 mLs by mouth 2 (two) times daily between meals. 10/13/15   Layne Benton, NP  potassium chloride (MICRO-K) 10 MEQ CR capsule TAKE ONE CAPSULE BY MOUTH DAILY ALTERNATING WITH TWO CAPSULES BY MOUTH DAILY 09/23/15   Tonny Bollman, MD  pramipexole (MIRAPEX) 0.125 MG tablet Take 1 tablet (0.125 mg total) by mouth 3 (three) times daily. 06/24/15   Dayna N Dunn, PA-C  QUEtiapine (SEROQUEL) 25 MG tablet Take 0.5 tablets (12.5 mg total) by mouth at bedtime. 10/13/15   Layne Benton, NP  torsemide (DEMADEX) 20 MG tablet TAKE 0.5 TAB (  TOTAL) BY MOUTH DAILY. 10/13/15   Layne Benton, NP    Allergies  Allergen Reactions  . Amlodipine Other (See Comments)    Feet swelled  . Atorvastatin Other (See Comments)     leg pain  . Zinc Other (See Comments)    Scaly rash    Physical Exam  Vitals  Blood pressure 83/55, pulse 96, temperature 103.7 F (39.8 C), temperature source Rectal, resp. rate 37, SpO2 97  %.   General: Frail, elderly male lying in bed, unresponsive, with significant work of breathing. Family at  bedside  Psych:  Unable to assess. Unresponsive  Neuro:   Unresponsive.  ENT:  Eyes are closed. Mucous membranes appear dry.  Neck:  Supple, No lymphadenopathy appreciated  Respiratory:  Increased work of breathing. Very coarse breath sounds bilaterally, on 15 L of oxygen.  Cardiac: Regular, positive systolic murmur, no rubs or gallops heard  Abdomen:  Positive bowel sounds, Soft, Non tender, Non distended,  No masses appreciated  Skin:  No Cyanosis, Normal Skin Turgor, No Skin Rash or Bruise. Sacral area not examined.  Extremities:  No effusions.    Data Review  Wt Readings from Last 3 Encounters:  10/05/15 88 kg (194 lb 0.1 oz)  09/29/15 92.987 kg (205 lb)  09/23/15 96.072 kg (211 lb 12.8 oz)    CBC  Recent Labs Lab 10/11/15 0430 10/12/15 0248 10/13/15 0417  WBC 7.7 9.0 10.7*  HGB 10.6* 11.1* 12.0*  HCT 32.6* 34.3* 36.6*  PLT 272 265 255  MCV 92.6 93.5 93.4  MCH 30.1 30.2 30.6  MCHC 32.5 32.4 32.8  RDW 15.1 15.1 15.4    Chemistries   Recent Labs Lab 10/10/15 1515 10/11/15 0430 10/12/15 0248 10/13/15 0417  NA 138 142 144 144  K 3.2* 2.8* 4.4 3.7  CL 100* 101 104 106  CO2 GLUCOSE 112* 118* 136* 148*  BUN 29* 27* 39* 36*  CREATININE 1.32* 1.69* 1.94* 1.42*  CALCIUM 8.3* 8.5* 8.8* 8.8*      Lab Results  Component Value Date   HGBA1C 5.8* 10/06/2015    Imaging results:   Ct Head Wo Contrast  10/06/2015  CLINICAL DATA:  81 year old male status post IV tPA 24 hours ago for left side weakness, code stroke. Initial encounter. EXAM: CT HEAD WITHOUT CONTRAST TECHNIQUE: Contiguous axial images were obtained from the base of the skull through the vertex without intravenous contrast. COMPARISON:  10/05/2015, levin 18 2015. FINDINGS: Study is mildly degraded by motion artifact despite repeated imaging attempts. No acute osseous  abnormality identified. Stable paranasal sinuses and mastoids. Stable orbit and scalp soft tissues No acute intracranial hemorrhage identified. No midline shift, mass effect, or evidence of intracranial mass lesion. The does appear to be a small area of new cortical hypodensity in the posterior right hemisphere, post central gyrus or adjacent (series 201, image 22). Suspect also the hypodensity in the parietal lobe seen on image 20 is new. There is however underlying confluent bilateral posterior frontal lobe and parietal lobe white matter hypodensity which appears stable. Gray-white matter DN appears stable. No suspicious intracranial vascular hyperdensity. IMPRESSION: 1. No acute intracranial hemorrhage or mass effect. 2. Suspicion of 1 or 2 small areas of evolving cortical infarct in the posterior right MCA territory. Underlying confluent bilateral MCA white matter hypodensity which appears stable. Electronically Signed   By: Odessa Fleming M.D.   On: 10/06/2015 13:46   Ct Head Wo Contrast  10/05/2015  CLINICAL DATA:  Acute onset left-sided weakness EXAM: CT HEAD WITHOUT CONTRAST TECHNIQUE: Contiguous axial images were obtained from the base of the skull through the vertex without intravenous contrast. COMPARISON:  October 20, 2014 FINDINGS: Moderate diffuse atrophy is again noted. There is no intracranial mass, hemorrhage, extra-axial fluid collection, or midline shift. There is small vessel disease throughout the centra semiovale bilaterally, stable. There is evidence of a prior infarct in the mid right frontal lobe with gray-white junction. There is also evidence of prior infarct at the right frontal-parietal junction superiorly. A small infarct in the superior  most aspect of the left parietal lobe is stable as well. No new gray-white compartment lesion is identified. No acute infarct is evident. The middle cerebral arteries show normal attenuation bilaterally. The bony calvarium appears intact.  The mastoid air  cells are clear. IMPRESSION: Atrophy with diffuse small vessel disease in the supratentorial white matter is stable. Prior infarcts remains stable as noted above. No acute infarct evident. No hemorrhage or mass effect. Critical Value/emergent results were called by telephone at the time of interpretation on 10/05/2015 at 2:18 pm to Dr. Ritta SlotMcNeill Kirkpatrick, neurology , who verbally acknowledged these results. Electronically Signed   By: Bretta BangWilliam  Woodruff III M.D.   On: 10/05/2015 14:20   Dg Chest Portable 1 View  11/02/2015  CLINICAL DATA:  Shortness of breath EXAM: PORTABLE CHEST 1 VIEW COMPARISON:  10/12/2015 FINDINGS: Mild to moderate cardiac enlargement stable. Cardiac pacer unchanged. Status post CABG. Vascular pattern normal. Mild opacity bilateral lung bases, likely atelectasis. Retrocardiac portion of left lower lobe not evaluated in detail on single AP view. IMPRESSION: Mild bibasilar opacities likely representing atelectasis. Electronically Signed   By: Esperanza Heiraymond  Rubner M.D.   On: 10/30/2015 12:22   Dg Chest Port 1 View  10/12/2015  CLINICAL DATA:  Congestive heart failure. EXAM: PORTABLE CHEST 1 VIEW COMPARISON:  10/10/2015. FINDINGS: Cardiac pacer with lead tips in right atrium and right ventricle. Prior CABG. Cardiomegaly with normal pulmonary vascularity. Mild left base subsegmental atelectasis. No pleural effusion or pneumothorax. IMPRESSION: 1. Cardiac pacer stable position. Prior CABG. Stable cardiomegaly. No CHF. 2. Mild left base subsegmental atelectasis . Electronically Signed   By: Maisie Fushomas  Register   On: 10/12/2015 07:48   Dg Chest Port 1 View  10/10/2015  CLINICAL DATA:  CHF, status post CVA, history of coronary artery disease and CABG. EXAM: PORTABLE CHEST 1 VIEW COMPARISON:  Portable chest x-ray of October 09, 2015 FINDINGS: The lungs remain well-expanded. There is no focal infiltrate. There is no pleural effusion. The cardiac silhouette remains enlarged. The pulmonary vascularity is  normal. There are post CABG changes. The first permanent pacemaker is in reasonable position. IMPRESSION: Stable mild cardiac enlargement without pulmonary edema. There is no acute cardiopulmonary abnormality. Electronically Signed   By: David  SwazilandJordan M.D.   On: 10/10/2015 07:06   Dg Chest Port 1 View  10/09/2015  CLINICAL DATA:  Patient with history of pulmonary vascular congestion. EXAM: PORTABLE CHEST 1 VIEW COMPARISON:  Chest radiograph 10/09/2015 FINDINGS: Patient is rotated to the left. Multi lead pacer apparatus overlies the left hemi thorax, leads are stable in position. Stable enlarged cardiac and mediastinal contours status post median sternotomy. No large area of pulmonary consolidation. No pleural effusion or pneumothorax. IMPRESSION: Cardiomegaly.  No acute cardiopulmonary process. Electronically Signed   By: Annia Beltrew  Davis M.D.   On: 10/09/2015 16:04   Dg Chest Port 1 View  10/09/2015  CLINICAL DATA:  81 year old male with history of pulmonary vascular congestion. Atrial fibrillation. Hypertension. EXAM: PORTABLE CHEST 1 VIEW COMPARISON:  Chest x-ray 10/08/2015. FINDINGS: Lung volumes are normal. No consolidative airspace disease. No definite pleural effusions. No pneumothorax. Mild pulmonary venous congestion, without frank pulmonary edema. Mild cardiomegaly. The patient is rotated to the left on today's exam, resulting in distortion of the mediastinal contours and reduced diagnostic sensitivity and specificity for mediastinal pathology. Atherosclerosis of the thoracic aorta. Status post median sternotomy for CABG including LIMA. Left-sided pacemaker device in position with lead tips projecting over the expected location of the right atrium and right ventricular  apex. IMPRESSION: 1. Cardiomegaly with pulmonary venous congestion, but no frank pulmonary edema. 2. Atherosclerosis. 3. Postoperative changes and support apparatus, as above. Electronically Signed   By: Trudie Reed M.D.   On:  10/09/2015 09:24   Dg Chest Port 1 View  10/08/2015  CLINICAL DATA:  Agitated. Left-sided weakness. Wheezing and restlessness EXAM: PORTABLE CHEST 1 VIEW COMPARISON:  08/06/2013 FINDINGS: Left chest wall pacer device is noted with leads in the right atrial appendage and right ventricle. Previous median sternotomy and CABG procedure. Moderate cardiac enlargement and pulmonary vascular congestion noted. IMPRESSION: 1. Cardiac enlargement and pulmonary vascular congestion. Electronically Signed   By: Signa Kell M.D.   On: 10/08/2015 11:52    My personal review of EKG: Paced rhythm with a right bundle branch block   Assessment & Plan  Principal Problem:   Septic shock (HCC) Active Problems:   Aspiration pneumonia (HCC)   Pacemaker-DDD-MDT   A-fib (HCC)   CVA (cerebral infarction)   Pressure ulcer   Respiratory failure (HCC)   Chronic kidney disease   Diastolic heart failure (HCC)   Septic shock Likely secondary to aspiration pneumonia Family requests comfort measures. We anticipate that he will not survive this hospitalization and may not survive today. Will admit patient to MedSurg.  Morphine, Ativan, scopolamine patch have been ordered as needed.  Aspiration pneumonia Comfort measures only. Will order a full liquid diet for the patient's comfort if he wakes up and wants to eat. Family is aware of and accepts the risks of aspiration.  Recent right MCA CVA (11/2) Will hold antiplatelet therapy at this time.  Patient's been unable to use his extremities and has had difficulty speaking and eating since. Comfort measures only.  Atrial fibrillation Not on anticoagulation.  Chronic kidney disease stage III  Comfort measures only  Diastolic heart failure with pulmonary hypertension Comfort measures only   Consultants Called:    none  Family Communication:     Wife, daughter, and daughter-in-law at bedside  Code Status:    DNR/DNI  Condition:    Not expected to survive  this hospitalization.  Potential Disposition:   Not expected to survive this hospitalization.  Time spent in minutes : [address]   [facility] [place],  [place] on October 29, 2015 at 12:51 PM Between 7am to 7pm - Pager - [phone] After 7pm go to [email or web address] - password [email or web address] And look for the night coverage person covering me after hours

## 2015-10-17 NOTE — Telephone Encounter (Signed)
Pt wife informed me that pt is very sick and that he may be in his final days of life. Pt wife said she would be talking with hospice today.

## 2015-10-17 NOTE — Consult Note (Signed)
WOC discussed pressure injury with bedside nurse.  She reports patient is going to palliative care and she will place sacral foam dressing when the best opportunity arises during her complete skin assessment.  Reports at previous admission area was Stage I.  Silicone foam will protect this area.  WOC will reassess with bedside staff in the am if patient last through the night.   Sueo Cullen MartinAustin RN,CWOCN [phone]

## 2015-10-17 NOTE — ED Notes (Signed)
[facility] Pt had recent stroke. Pt was altered over weekend. Pain in right arm since stroke. Right arm warm to touch. Rales in all lung fields. Hx CHF. Pt hypotensive. Reported SpO2 83% on RA. BP 84/50, 94-99% on 15L NRB.

## 2015-10-17 NOTE — ED Provider Notes (Signed)
CSN: [id number]     Arrival date & time 10/26/2015  1136 History   First MD Initiated Contact with Patient 10/07/2015 1155     Chief Complaint  Patient presents with  . Altered Mental Status      The history is provided by the EMS personnel, medical records and a relative. No language interpreter was used.  Mr. Stuck presents for evaluation of altered mental status. 5 He out due to unresponsiveness. History is provided by EMS and family. He was recently discharged from the hospital after suffering a stroke that left him with an aphasia. Since discharge from the hospital he has experienced progressive change in mental status with increased somnolence. Family also reports increased difficulty breathing for the last 2 days. He has been having trouble with swallowing.   Past Medical History  Diagnosis Date  . A-fib (HCC)     paroxysmal, s/p TEE-DCCV 03/2013;  was on coumadin , pt desired to stop it , no longer a candidate per cards (hemorrhoidal bleeding; falls)  . CAD (coronary artery disease)     a. MI angioplasty 93, b. s/p CABG 01/2006;   c.  NSTEMI 06/2010 - [facility]:  L-LAD ok, S-Dx ok, S-OM ok, S-PDA ok, small OM sub total occluded - Med Rx  . Chronic diastolic heart failure (HCC)   . Hyperlipidemia   . Hypertension   . Spinal stenosis      has seen Dr Ethelene Hal before, s/p local injection which helped x a while   . GERD (gastroesophageal reflux disease)   . RLS (restless legs syndrome)   . Insomnia   . History of BPH     TURP '96 at [facility] 96, urethoplast 97 ( History of bladder outlet obstruction, chronic incontinence)  . B12 deficiency anemia   . Myocardial infarction (HCC) 01/1992; 06/2010  . Arthritis   . Hx of cardiovascular stress test     a.  Myoview (10/2005):  EF 62%, mild apical ischemia  . Hx of echocardiogram     a.  Echo (03/23/13):  mod LVH, EF 50-55%, no WMA, mild MR, mod LAE, mild RVE, mod RAE, mod TR   Past Surgical History  Procedure Laterality Date  . Coronary artery  bypass graft      "CABG X4" (05/14/2013)  . Cystoscopy with urethral dilatation  1997    "fixed strictures from TURP" (05/14/2013)  . Inguinal hernia repair Right   . Carpal tunnel release Bilateral ~ 2005    Dr Teressa Senter  . Pacemaker insertion  12/2006    MDT dual chamber PPM implanted for SSS  . Tee without cardioversion N/A 03/27/2013    Procedure: TRANSESOPHAGEAL ECHOCARDIOGRAM (TEE);  Surgeon: Lewayne Bunting, MD;  Location: [facility] ENDOSCOPY;  Service: Cardiovascular;  Laterality: N/A;  . Cardioversion N/A 03/27/2013    Procedure: CARDIOVERSION;  Surgeon: Lewayne Bunting, MD;  Location: [facility] ENDOSCOPY;  Service: Cardiovascular;  Laterality: N/A;  . Total knee arthroplasty Bilateral     right 2005, Left 2006.  Dr  Despina Hick  . Coronary angioplasty      "Dr. Riley Kill"  . Cataract extraction w/ intraocular lens  implant, bilateral Bilateral 2012  . Transurethral resection of prostate  1996  . Ep implantable device N/A 06/24/2015    Procedure:  PPM Generator Changeout;  Surgeon: Duke Salvia, MD;  Location: [facility] INVASIVE CV LAB;  Service: Cardiovascular;  Laterality: N/A;  . Ep implantable device N/A 06/27/2015    Procedure: PPM Generator Changeout;  Surgeon: Salvatore Decent  Graciela Husbands, MD;  Location: [facility] INVASIVE CV LAB;  Service: Cardiovascular;  Laterality: N/A;   Family History  Problem Relation Age of Onset  . Heart disease Neg Hx   . Diabetes Neg Hx   . Stroke Mother   . Cancer Father    Social History  Substance Use Topics  . Smoking status: Former Smoker -- 2.00 packs/day for 15 years    Types: Cigarettes    Quit date: 02/01/1959  . Smokeless tobacco: Never Used  . Alcohol Use: No    Review of Systems  Unable to perform ROS: Patient unresponsive      Allergies  Amlodipine; Atorvastatin; and Zinc  Home Medications   Prior to Admission medications   Medication Sig Start Date End Date Taking? Authorizing Provider  aspirin EC 81 MG EC tablet Take 1 tablet (81 mg total) by mouth daily.  10/13/15   Layne Benton, NP  B Complex-C (B-COMPLEX WITH VITAMIN C) tablet Take 1 tablet by mouth daily.    Historical Provider, MD  beta carotene w/minerals (OCUVITE) tablet Take 1 tablet by mouth daily.    Historical Provider, MD  carvedilol (COREG) 6.25 MG tablet Take 1 tablet (6.25 mg total) by mouth 2 (two) times daily with a meal. 04/12/15   Wanda Plump, MD  clopidogrel (PLAVIX) 75 MG tablet Take 1 tablet (75 mg total) by mouth daily. 10/13/15   Layne Benton, NP  feeding supplement, ENSURE ENLIVE, (ENSURE ENLIVE) LIQD Take 237 mLs by mouth 2 (two) times daily between meals. 10/13/15   Layne Benton, NP  potassium chloride (MICRO-K) 10 MEQ CR capsule TAKE ONE CAPSULE BY MOUTH DAILY ALTERNATING WITH TWO CAPSULES BY MOUTH DAILY 09/23/15   Tonny Bollman, MD  pramipexole (MIRAPEX) 0.125 MG tablet Take 1 tablet (0.125 mg total) by mouth 3 (three) times daily. 06/24/15   Dayna N Dunn, PA-C  QUEtiapine (SEROQUEL) 25 MG tablet Take 0.5 tablets (12.5 mg total) by mouth at bedtime. 10/13/15   Layne Benton, NP  torsemide (DEMADEX) 20 MG tablet TAKE 0.5 TAB (  TOTAL) BY MOUTH DAILY. 10/13/15   Layne Benton, NP   There were no vitals taken for this visit. Physical Exam  Constitutional: He appears distressed.  HENT:  Head: Normocephalic and atraumatic.  Eyes: Pupils are equal, round, and reactive to light.  Cardiovascular: Normal rate and regular rhythm.   Pulmonary/Chest: He is in respiratory distress.  Respiratory distress with increased work of breathing. Diffuse right-sided crackles, decreased air movement in left lung fields.  Abdominal: Soft. There is no tenderness. There is no rebound and no guarding.  Musculoskeletal:  1+ edema bilateral lower extremities  Neurological:  Lethargic, GCS 1, 1, 1  Skin: Skin is warm and dry.  Psychiatric:  Unable to assess  Nursing note and vitals reviewed.   ED Course  Procedures (including critical care time) Labs Review Labs Reviewed  CBG  MONITORING, ED - Abnormal; Notable for the following:    Glucose-Capillary 123 (*)    All other components within normal limits  CBG MONITORING, ED    Imaging Review Dg Chest Portable 1 View  12-Nov-2015  CLINICAL DATA:  Shortness of breath EXAM: PORTABLE CHEST 1 VIEW COMPARISON:  10/12/2015 FINDINGS: Mild to moderate cardiac enlargement stable. Cardiac pacer unchanged. Status post CABG. Vascular pattern normal. Mild opacity bilateral lung bases, likely atelectasis. Retrocardiac portion of left lower lobe not evaluated in detail on single AP view. IMPRESSION: Mild bibasilar opacities likely representing atelectasis. Electronically Signed  By: Esperanza Heiraymond  Rubner M.D.   On: 10/16/2015 12:22   I have personally reviewed and evaluated these images and lab results as part of my medical decision-making.   EKG Interpretation None      MDM   Final diagnoses:  Septic shock (HCC)  Acute respiratory failure with hypoxia (HCC)    Patient with history of recent CVA here for altered mental status. On initial evaluation patient in severe respiratory distress and septic shock. Discussed with patient's wife and daughter critical nature of illness. Family states the patient would not like aggressive measures taken and would like to make him comfortable. Family would like to make patient comfort care. Hospitalist consulted for admission.    Tilden FossaElizabeth Ryne Mctigue, MD 10/29/2015 [phone]

## 2015-10-18 DIAGNOSIS — N182 Chronic kidney disease, stage 2 (mild): Secondary | ICD-10-CM

## 2015-10-18 DIAGNOSIS — I481 Persistent atrial fibrillation: Secondary | ICD-10-CM

## 2015-10-18 DIAGNOSIS — L8990 Pressure ulcer of unspecified site, unspecified stage: Secondary | ICD-10-CM

## 2015-10-18 DIAGNOSIS — A419 Sepsis, unspecified organism: Principal | ICD-10-CM

## 2015-10-18 DIAGNOSIS — R6521 Severe sepsis with septic shock: Secondary | ICD-10-CM

## 2015-10-18 DIAGNOSIS — I63411 Cerebral infarction due to embolism of right middle cerebral artery: Secondary | ICD-10-CM

## 2015-10-18 DIAGNOSIS — Z950 Presence of cardiac pacemaker: Secondary | ICD-10-CM

## 2015-10-18 MED ORDER — CHLORHEXIDINE GLUCONATE 0.12 % MT SOLN: 15.0000 mL | Freq: Two times a day (BID) | OROMUCOSAL | Status: DC

## 2015-10-18 MED ORDER — CETYLPYRIDINIUM CHLORIDE 0.05 % MT LIQD
7.0000 mL | Freq: Two times a day (BID) | OROMUCOSAL | Status: DC
  Administered 2015-10-18: 7 mL via OROMUCOSAL

## 2015-10-19 NOTE — Telephone Encounter (Signed)
Forms faxed to [facility] [facility] [employer] successfully. Sent for scanning. JG//CMA

## 2015-11-02 ENCOUNTER — Ambulatory Visit: Payer: Self-pay | Admitting: Neurology

## 2015-11-03 NOTE — Progress Notes (Signed)
Nutrition Brief Note  Chart reviewed. Pt now transitioning to comfort care.  No further nutrition interventions warranted at this time.  Please re-consult as needed.   Shadae Reino A. Lauryn Lizardi, RD, LDN, CDE Pager: [phone] After hours Pager: [phone]  

## 2015-11-03 NOTE — Clinical Documentation Improvement (Signed)
Hospitalist  (please document your query response in the progress notes and discharge summary, not on the BPA form itself.)  "Pressure ulcer" is documented in the H&P.  The current WOC nurse note dated 2015-03-02 at 2:54 pm does not include the stage of the pressure and the specific location of the ulcer for this current admission.  Please document the Location and Stage of the "pressure ulcer" in the progress notes and discharge summary.    Please exercise your independent, professional judgment when responding. A specific answer is not anticipated or expected.   Thank You,  Jerral Ralphathy R Alease Fait  RN BSN CCDS [phone] Health Information Management [employer]

## 2015-11-03 NOTE — Progress Notes (Signed)
[facility]                                                                              Patient Demographics  [name], is a 81 y.o. male, DOB - 11-Jul-1921, ZHY:[id number]  Admit date - 10/10/2015   Admitting Physician Ozella Rocksavid J Merrell, MD  Outpatient Primary MD for the patient is Willow OraJose Paz, MD  LOS - 1   Chief Complaint  Patient presents with  . Altered Mental Status       Brief HPI    Per Admit note by Dr. Margot AblesMerrill on 11/14 [name] is a 81 y.o. male, with atrial fibrillation, chronic diastolic heart failure, chronic kidney disease, frequent falls, who had a right MCA infarct on 11/2 presented to ED unresponsive and in acute respiratory failure. His wife reported after [facility]10 [facility]. Next morning, patient was noticed to be poorly responsive and has been that way since for last 3 days prior to admission with labored breathing. His daughter reported that he seemed to choke when eating small amounts of food. In the emergency department his temperature is 103.7 respirations between 37 and 43, systolic blood pressures in the 60s. Patient was admitted with septic shock, acute respiratory failure, family requested comfort care  Assessment & Plan    Principal Problem: Septic shock Likely secondary to aspiration pneumonia Family requestedcomfort measures.  Continue comfort measures, palliative and hospice consult today  May not survive this hospitalization  Aspiration pneumonia Comfort measures only. Patient is currently unresponsive.   Recent right MCA CVA (11/2) Continue to hold antiplatelet therapy at this time.Comfort measures only.  Atrial fibrillation Not on anticoagulation.  Chronic kidney disease stage III  Comfort measures only  Diastolic heart failure with pulmonary hypertension Comfort measures only  Code Status: DNR/DNI, comfort care  Family  Communication: Discussed in detail with the patient, all imaging results, lab results explained to the patient's daughter-in-law at the bedside. Discussed about residential hospice, patient's family is agreeable to discuss with hospice.  Disposition Plan:  Time Spent in minutes 25 minutes  Procedures  none   Consults   Palliative  DVT Prophylaxis comfort care  Medications  Scheduled Meds: . antiseptic oral rinse  7 mL Mouth Rinse q12n4p  . chlorhexidine  15 mL Mouth Rinse BID  . scopolamine  1 patch Transdermal Q72H   Continuous Infusions:  PRN Meds:.acetaminophen **OR** acetaminophen, bisacodyl, LORazepam, morphine injection, ondansetron **OR** ondansetron (ZOFRAN) IV   Antibiotics   Anti-infectives    None        Subjective:   [name] was seen and examined today. Unresponsive with shallow breathing however appears comfortable, no fevers or chills.  Objective:   Blood pressure 80/45, pulse 83, temperature 98.5 F (36.9 C), temperature source Oral, resp. rate 18, SpO2 91 %.  Wt Readings from Last 3 Encounters:  10/05/15 88 kg (194 lb 0.1 oz)  09/29/15 92.987 kg (205 lb)  09/23/15 96.072 kg (211 lb 12.8 oz)     Intake/Output Summary (Last 24 hours) at 03-10-15 1243 Last data filed at 10/27/2015 2239  Gross per 24 hour  Intake      0 ml  Output      0 ml  Net      0 ml    Exam  General: Unresponsive  HEENT:  PERRLA.   Neck: Supple  CVS: S1 S2 clear, RRR  Respiratory: Decreased breath sounds at the bases  Abdomen: Soft, nontender, nondistended, + bowel sounds  Ext: no cyanosis clubbing or edema  Neuro: unresponsive  Skin: No rashes  Psych: unresponsive   Data Review   Micro Results No results found for this or any previous visit (from the past 240 hour(s)).  Radiology Reports Ct Head Wo Contrast  10/06/2015  CLINICAL DATA:  81 year old male status post IV tPA 24 hours ago for left side weakness, code stroke. Initial encounter.  EXAM: CT HEAD WITHOUT CONTRAST TECHNIQUE: Contiguous axial images were obtained from the base of the skull through the vertex without intravenous contrast. COMPARISON:  10/05/2015, levin 18 2015. FINDINGS: Study is mildly degraded by motion artifact despite repeated imaging attempts. No acute osseous abnormality identified. Stable paranasal sinuses and mastoids. Stable orbit and scalp soft tissues No acute intracranial hemorrhage identified. No midline shift, mass effect, or evidence of intracranial mass lesion. The does appear to be a small area of new cortical hypodensity in the posterior right hemisphere, post central gyrus or adjacent (series 201, image 22). Suspect also the hypodensity in the parietal lobe seen on image 20 is new. There is however underlying confluent bilateral posterior frontal lobe and parietal lobe white matter hypodensity which appears stable. Gray-white matter DN appears stable. No suspicious intracranial vascular hyperdensity. IMPRESSION: 1. No acute intracranial hemorrhage or mass effect. 2. Suspicion of 1 or 2 small areas of evolving cortical infarct in the posterior right MCA territory. Underlying confluent bilateral MCA white matter hypodensity which appears stable. Electronically Signed   By: Odessa Fleming M.D.   On: 10/06/2015 13:46   Ct Head Wo Contrast  10/05/2015  CLINICAL DATA:  Acute onset left-sided weakness EXAM: CT HEAD WITHOUT CONTRAST TECHNIQUE: Contiguous axial images were obtained from the base of the skull through the vertex without intravenous contrast. COMPARISON:  October 20, 2014 FINDINGS: Moderate diffuse atrophy is again noted. There is no intracranial mass, hemorrhage, extra-axial fluid collection, or midline shift. There is small vessel disease throughout the centra semiovale bilaterally, stable. There is evidence of a prior infarct in the mid right frontal lobe with gray-white junction. There is also evidence of prior infarct at the right frontal-parietal  junction superiorly. A small infarct in the superior most aspect of the left parietal lobe is stable as well. No new gray-white compartment lesion is identified. No acute infarct is evident. The middle cerebral arteries show normal attenuation bilaterally. The bony calvarium appears intact.  The mastoid air cells are clear. IMPRESSION: Atrophy with diffuse small vessel disease in the supratentorial white matter is stable. Prior infarcts remains stable as noted above. No acute infarct evident. No hemorrhage or mass effect. Critical Value/emergent results were called by telephone at the time of interpretation on 10/05/2015 at 2:18 pm to Dr. Ritta Slot, neurology , who verbally acknowledged these results. Electronically Signed   By: Bretta Bang III M.D.   On: 10/05/2015 14:20   Dg Chest Portable 1 View  10/27/2015  CLINICAL DATA:  Shortness of breath EXAM: PORTABLE CHEST 1 VIEW COMPARISON:  10/12/2015 FINDINGS: Mild to moderate cardiac enlargement stable. Cardiac pacer unchanged. Status post CABG. Vascular pattern normal. Mild opacity bilateral lung  bases, likely atelectasis. Retrocardiac portion of left lower lobe not evaluated in detail on single AP view. IMPRESSION: Mild bibasilar opacities likely representing atelectasis. Electronically Signed   By: Esperanza Heir M.D.   On: 10/29/15 12:22   Dg Chest Port 1 View  10/12/2015  CLINICAL DATA:  Congestive heart failure. EXAM: PORTABLE CHEST 1 VIEW COMPARISON:  10/10/2015. FINDINGS: Cardiac pacer with lead tips in right atrium and right ventricle. Prior CABG. Cardiomegaly with normal pulmonary vascularity. Mild left base subsegmental atelectasis. No pleural effusion or pneumothorax. IMPRESSION: 1. Cardiac pacer stable position. Prior CABG. Stable cardiomegaly. No CHF. 2. Mild left base subsegmental atelectasis . Electronically Signed   By: Maisie Fus  Register   On: 10/12/2015 07:48   Dg Chest Port 1 View  10/10/2015  CLINICAL DATA:  CHF, status  post CVA, history of coronary artery disease and CABG. EXAM: PORTABLE CHEST 1 VIEW COMPARISON:  Portable chest x-ray of October 09, 2015 FINDINGS: The lungs remain well-expanded. There is no focal infiltrate. There is no pleural effusion. The cardiac silhouette remains enlarged. The pulmonary vascularity is normal. There are post CABG changes. The first permanent pacemaker is in reasonable position. IMPRESSION: Stable mild cardiac enlargement without pulmonary edema. There is no acute cardiopulmonary abnormality. Electronically Signed   By: David  [place] M.D.   On: 10/10/2015 07:06   Dg Chest Port 1 View  10/09/2015  CLINICAL DATA:  Patient with history of pulmonary vascular congestion. EXAM: PORTABLE CHEST 1 VIEW COMPARISON:  Chest radiograph 10/09/2015 FINDINGS: Patient is rotated to the left. Multi lead pacer apparatus overlies the left hemi thorax, leads are stable in position. Stable enlarged cardiac and mediastinal contours status post median sternotomy. No large area of pulmonary consolidation. No pleural effusion or pneumothorax. IMPRESSION: Cardiomegaly.  No acute cardiopulmonary process. Electronically Signed   By: Annia Belt M.D.   On: 10/09/2015 16:04   Dg Chest Port 1 View  10/09/2015  CLINICAL DATA:  81 year old male with history of pulmonary vascular congestion. Atrial fibrillation. Hypertension. EXAM: PORTABLE CHEST 1 VIEW COMPARISON:  Chest x-ray 10/08/2015. FINDINGS: Lung volumes are normal. No consolidative airspace disease. No definite pleural effusions. No pneumothorax. Mild pulmonary venous congestion, without frank pulmonary edema. Mild cardiomegaly. The patient is rotated to the left on today's exam, resulting in distortion of the mediastinal contours and reduced diagnostic sensitivity and specificity for mediastinal pathology. Atherosclerosis of the thoracic aorta. Status post median sternotomy for CABG including LIMA. Left-sided pacemaker device in position with lead tips projecting  over the expected location of the right atrium and right ventricular apex. IMPRESSION: 1. Cardiomegaly with pulmonary venous congestion, but no frank pulmonary edema. 2. Atherosclerosis. 3. Postoperative changes and support apparatus, as above. Electronically Signed   By: Trudie Reed M.D.   On: 10/09/2015 09:24   Dg Chest Port 1 View  10/08/2015  CLINICAL DATA:  Agitated. Left-sided weakness. Wheezing and restlessness EXAM: PORTABLE CHEST 1 VIEW COMPARISON:  08/06/2013 FINDINGS: Left chest wall pacer device is noted with leads in the right atrial appendage and right ventricle. Previous median sternotomy and CABG procedure. Moderate cardiac enlargement and pulmonary vascular congestion noted. IMPRESSION: 1. Cardiac enlargement and pulmonary vascular congestion. Electronically Signed   By: Signa Kell M.D.   On: 10/08/2015 11:52    CBC  Recent Labs Lab 10/12/15 0248 10/13/15 0417  WBC 9.0 10.7*  HGB 11.1* 12.0*  HCT 34.3* 36.6*  PLT 265 255  MCV 93.5 93.4  MCH 30.2 30.6  MCHC 32.4 32.8  RDW  15.1 15.4    Chemistries   Recent Labs Lab 10/12/15 0248 10/13/15 0417  NA 144 144  K 4.4 3.7  CL 104 106  CO2 26 27  GLUCOSE 136* 148*  BUN 39* 36*  CREATININE 1.94* 1.42*  CALCIUM 8.8* 8.8*   ------------------------------------------------------------------------------------------------------------------ estimated creatinine clearance is 37.9 mL/min (by C-G formula based on Cr of 1.42). ------------------------------------------------------------------------------------------------------------------ No results for input(s): HGBA1C in the last 72 hours. ------------------------------------------------------------------------------------------------------------------ No results for input(s): CHOL, HDL, LDLCALC, TRIG, CHOLHDL, LDLDIRECT in the last 72 hours. ------------------------------------------------------------------------------------------------------------------ No  results for input(s): TSH, T4TOTAL, T3FREE, THYROIDAB in the last 72 hours.  Invalid input(s): FREET3 ------------------------------------------------------------------------------------------------------------------ No results for input(s): VITAMINB12, FOLATE, FERRITIN, TIBC, IRON, RETICCTPCT in the last 72 hours.  Coagulation profile No results for input(s): INR, PROTIME in the last 168 hours.  No results for input(s): DDIMER in the last 72 hours.  Cardiac Enzymes No results for input(s): CKMB, TROPONINI, MYOGLOBIN in the last 168 hours.  Invalid input(s): CK ------------------------------------------------------------------------------------------------------------------ Invalid input(s): POCBNP   Recent Labs  10/30/2015 1149  GLUCAP 123*     RAI,RIPUDEEP M.D. [facility] 2015-10-30, 12:43 PM  Pager: [phone] Between 7am to 7pm - call Pager - [phone]  After 7pm go to [email or web address] - password [email or web address]  Call night coverage person covering after 7pm

## 2015-11-03 NOTE — Discharge Summary (Signed)
Expiration Note/ Death Summary  [name]  MR#: [id number]  DOB:1921/09/16  Date of Admission: 02-10-15 Date of Death: 11/02/2015 at 1415  Attending Physician:Madilynne Mullan  Patient's PCP: Willow OraJose Paz, MD  Consults: Treatment Team:  Palliative [facility]  Cause of Death: Septic shock [facility](HCC)  Secondary Diagnoses  . acute Respiratory failure (HCC) . Septic shock (HCC) . Aspiration pneumonia (HCC) . A-fib (HCC) . CVA (cerebral infarction) . Pacemaker-DDD-MDT . sacral Pressure ulcer stage I  . Chronic kidney disease . Diastolic heart failure (HCC)   Brief H and P: For complete details please refer to admission H and P, but in brief  [name] was a  81 y.o. male, with atrial fibrillation, chronic diastolic heart failure, chronic kidney disease, frequent falls, who had a right MCA infarct on 11/2 presented to ED unresponsive and in acute respiratory failure. His wife reported after discharge from [facility] on Friday 11/10 he was [facility]. Next morning, patient was noticed to be poorly responsive and has been that way since for last 3 days prior to admission with labored breathing. His daughter reported that he seemed to choke when eating small amounts of food. In the emergency department his temperature was 103.7 respirations between 37 and 43, systolic blood pressures in the 60s. Patient was admitted with septic shock, acute respiratory failure, family requested comfort care  Hospital Course: Septic shock, aspiration pneumonia and acute respiratory failure Likely secondary to aspiration pneumonia due to recent stroke. Patient presented with fever, tachypnea, hypotensive. Patient's family requested no escalation of care and comfort measures only. Palliative medicine and hospice was consulted. Patient was placed on comfort care medications and symptom control. Patient passed on 10/16/2015 at 2:15 PM.   Recent right MCA CVA (11/2) All the  oral medications were held as patient was unresponsive, patient was placed on comfort measures only.   Atrial fibrillation Not on anticoagulation.  Chronic kidney disease stage III  Comfort measures only  Diastolic heart failure with pulmonary hypertension Comfort measures only  Stage I sacral wound pressure ulcer Wound Care was consulted, however patient was comfort care status  Signed:  Cristella Stiver M.D. [facility] 10/20/2015, 3:10 PM Pager: [phone]

## 2015-11-03 NOTE — Progress Notes (Signed)
Called into patient's room by family because his breathing was becoming slower.  Upon assessment, patient demonstrated agonal breathing and was unresponsive.  Soon after, patient stopped breathing and was pulseless.  Time of death [id number].  This was confirmed by Elease HashimotoKristie Beckner, RN.  Dr. Isidoro Donningai notified and will sign the death certificate.  Next of kin at bedside and were offered emotional support.  CDS notified and patient is not a suitable donor.  Body will be prepared and taken to the morgue. Bed placement notified.

## 2015-11-03 DEATH — deceased

## 2016-01-13 NOTE — Patient Outreach (Signed)
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
